# Patient Record
Sex: Female | Born: 1955 | Race: Black or African American | Hispanic: No | Marital: Single | Smoking: Current some day smoker
Health system: Southern US, Community
[De-identification: ages and names within clinical notes are randomized; demographics above are authoritative.]

## PROBLEM LIST (undated history)

## (undated) DIAGNOSIS — M109 Gout, unspecified: Secondary | ICD-10-CM

## (undated) DIAGNOSIS — I10 Essential (primary) hypertension: Secondary | ICD-10-CM

## (undated) DIAGNOSIS — E119 Type 2 diabetes mellitus without complications: Secondary | ICD-10-CM

## (undated) DIAGNOSIS — M1990 Unspecified osteoarthritis, unspecified site: Secondary | ICD-10-CM

## (undated) HISTORY — PX: OTHER SURGICAL HISTORY: SHX169

---

## 2004-06-28 ENCOUNTER — Emergency Department: Payer: Self-pay | Admitting: Internal Medicine

## 2005-06-01 ENCOUNTER — Emergency Department: Payer: Self-pay | Admitting: Emergency Medicine

## 2006-11-25 ENCOUNTER — Emergency Department: Payer: Self-pay | Admitting: Emergency Medicine

## 2008-03-05 ENCOUNTER — Emergency Department: Payer: Self-pay | Admitting: Emergency Medicine

## 2009-10-03 IMAGING — CT CT ABD-PELV W/ CM
1 of 2 series · 15 of 32 positions shown, 19 images · non-contrast
Comparison: none

REASON FOR EXAM: (1) RLQ abd pain nausea vomiting and diarrhea; (2) RLQ
abd pain nausea vomiting
COMMENTS:

PROCEDURE:     CT  - CT ABDOMEN / PELVIS  W  - [DATE] [DATE]
RESULT:
TECHNIQUE: Helical 3 mm sections were obtained from the lung bases through
the pubic symphysis status post intravenous administration of 100 mL of
[JX].

[Series 2: appendicitis · axial · 0.77mm/px · z∈[-631,-226]mm · 15 of 147 slices shown, 19 images]
[im 6/147  soft-tissue]
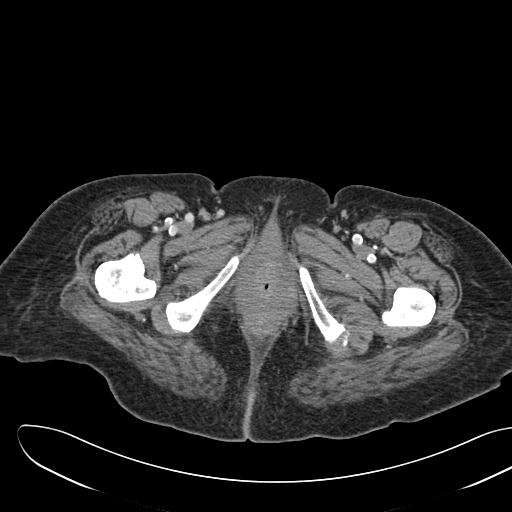
[im 6/147  bone]
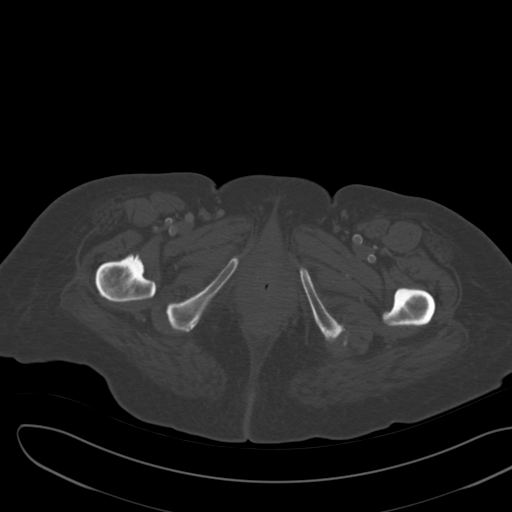
[im 18/147  soft-tissue]
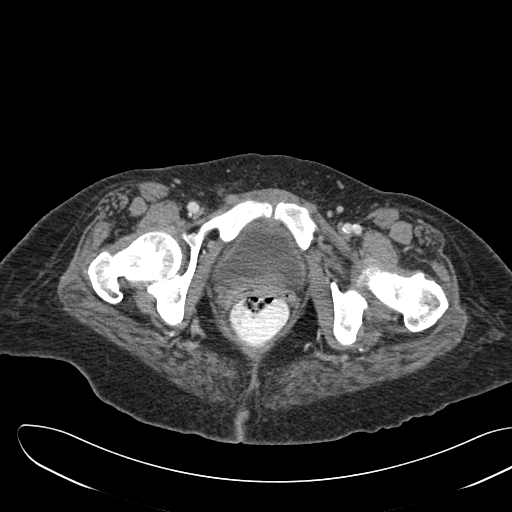
[im 30/147  soft-tissue]
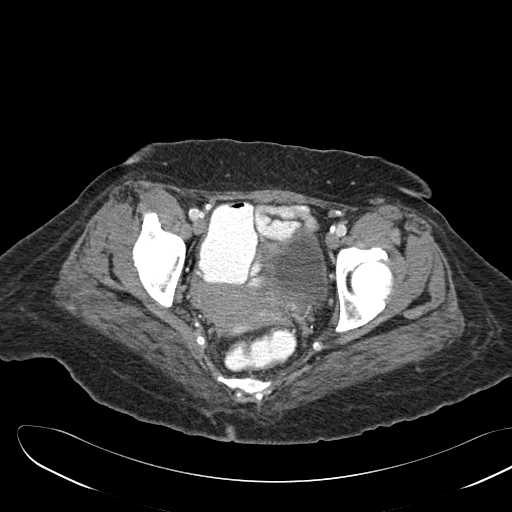
[im 41/147  soft-tissue]
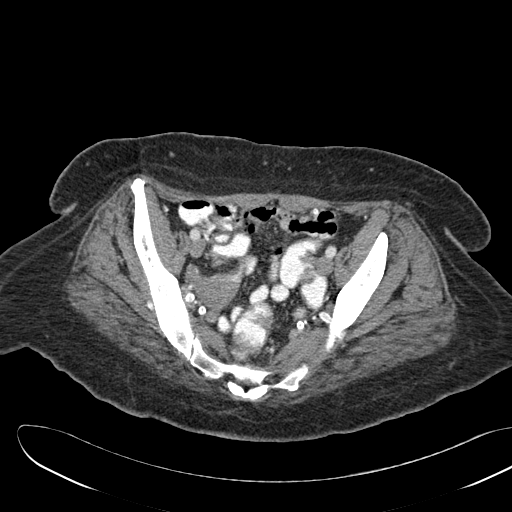
[im 53/147  soft-tissue]
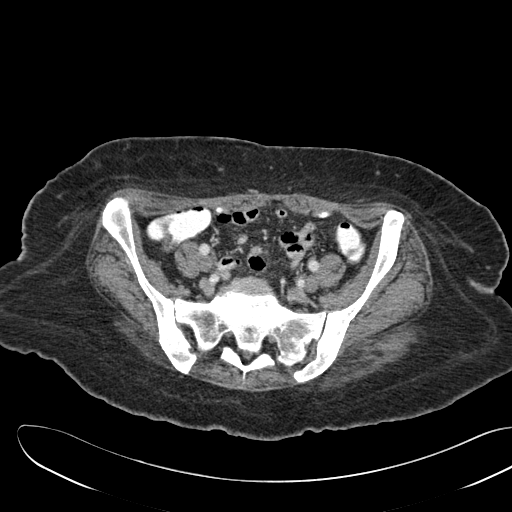
[im 65/147  soft-tissue]
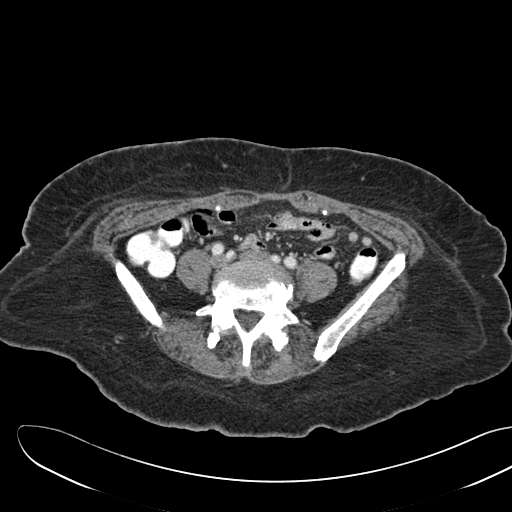
[im 76/147  soft-tissue]
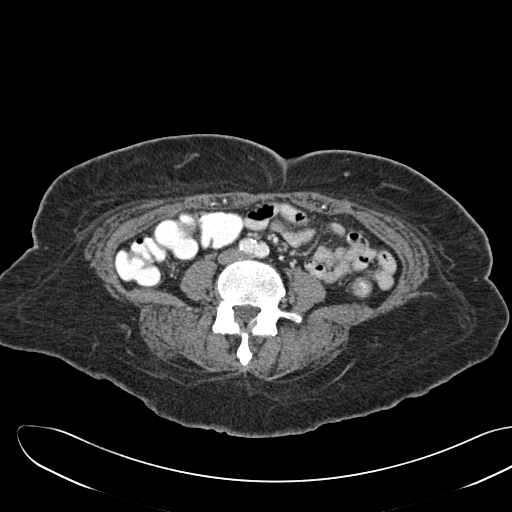
[im 82/147  soft-tissue]
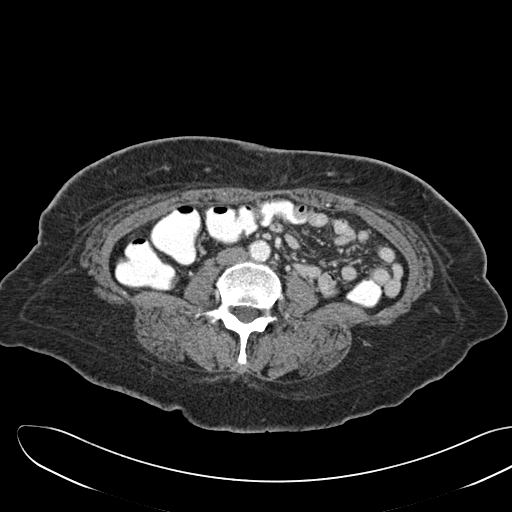
[im 94/147  soft-tissue]
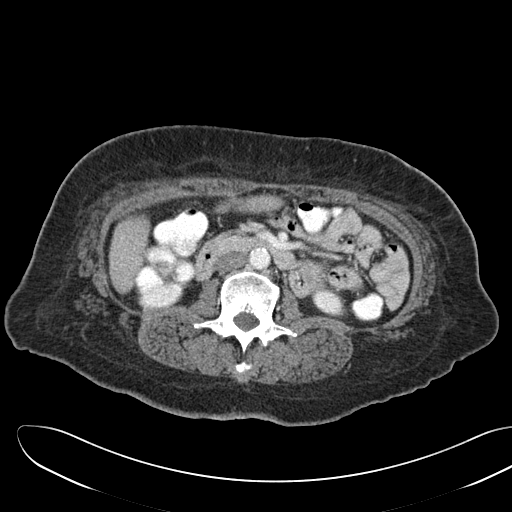
[im 94/147  bone]
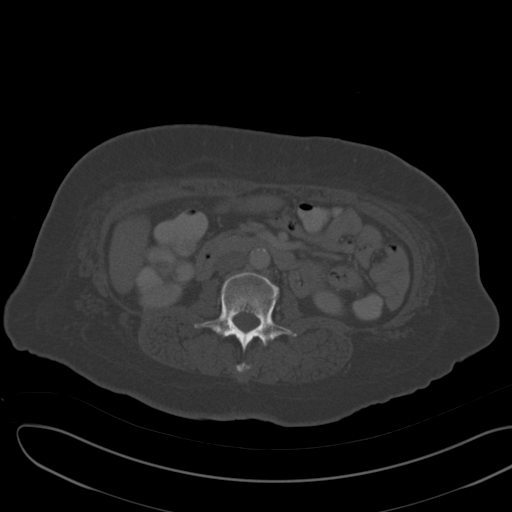
[im 106/147  soft-tissue]
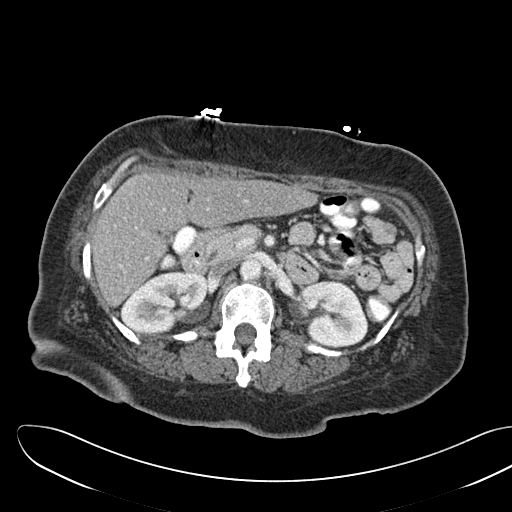
[im 117/147  soft-tissue]
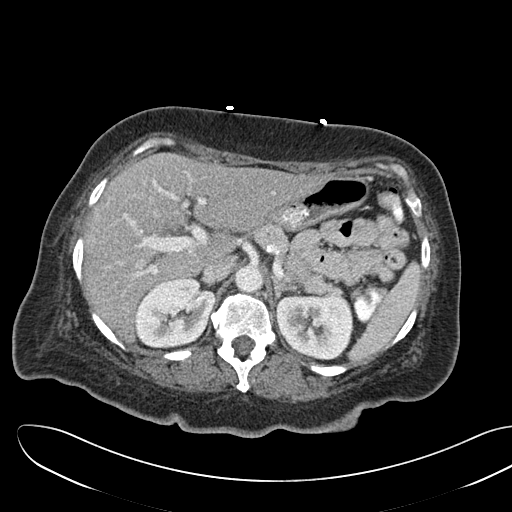
[im 123/147  lung]
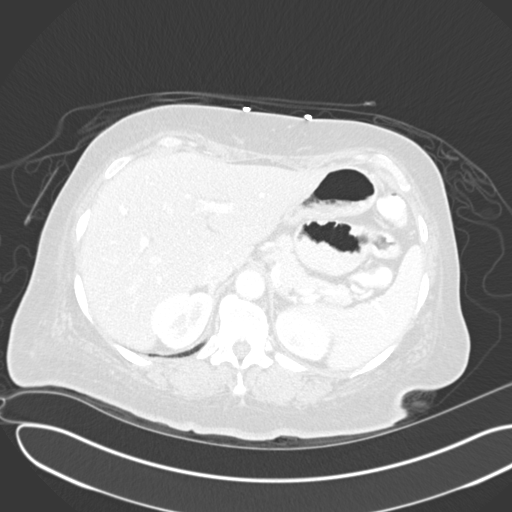
[im 129/147  soft-tissue]
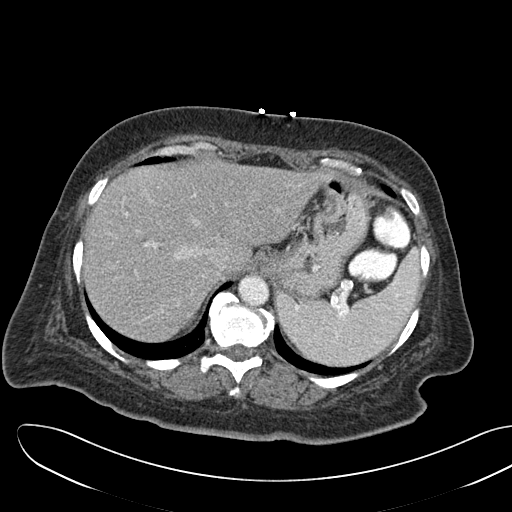
[im 129/147  lung]
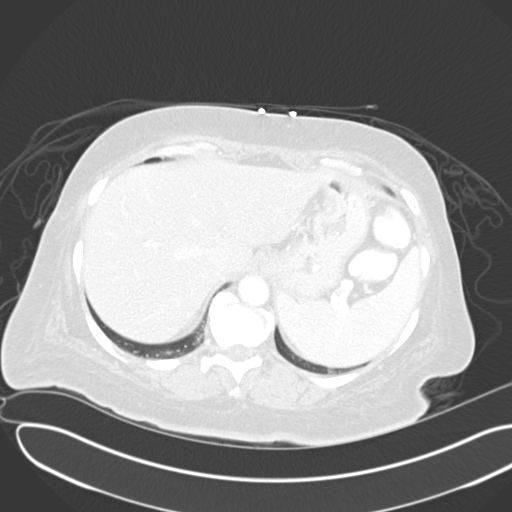
[im 135/147  lung]
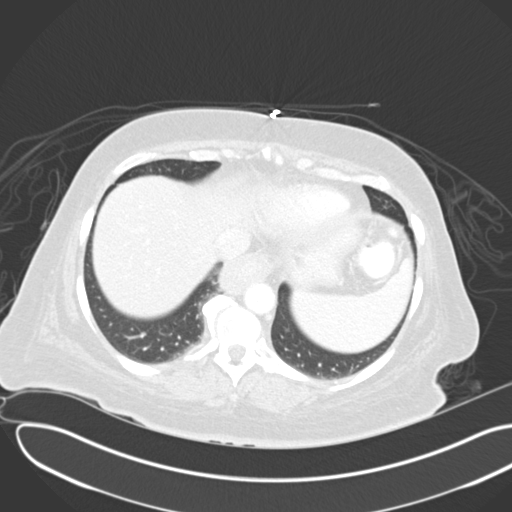
[im 141/147  soft-tissue]
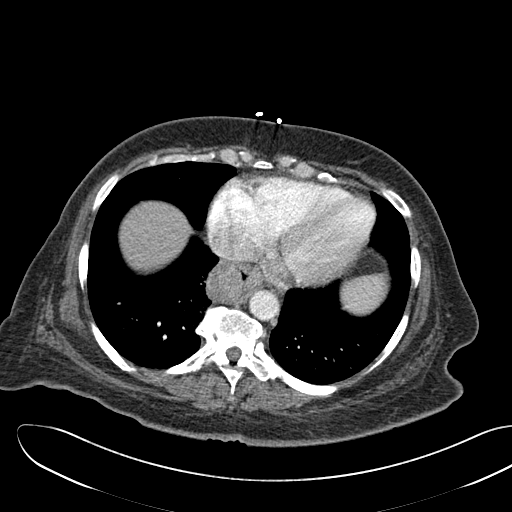
[im 141/147  lung]
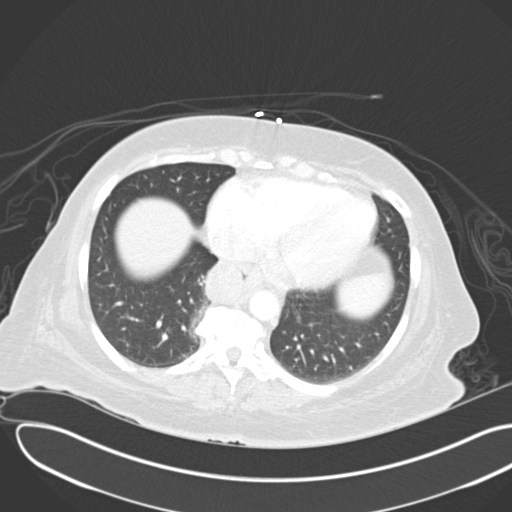

[15 of 32 positions shown; findings below may reference images not displayed]

FINDINGS: There is diffuse wall thickening of the distal esophagus. The
visualized lung parenchyma is otherwise grossly unremarkable. The liver,
spleen, adrenals, pancreas and kidneys are unremarkable. There is no CT
evidence of bowel obstruction or secondary signs reflecting enteritis,
colitis or diverticulitis.
IMPRESSION: 1.     Diffuse thickening of the distal esophageal wall. Further evaluation
with direct visualization is recommended. Infectious or inflammatory
etiologies are of differential concern though more ominous etiologies cannot
be excluded.
2.     Dr. MOULEY of the Emergency Department was informed of these
findings via preliminary fax report on [DATE].

## 2009-10-03 IMAGING — CT CT HEAD WITHOUT CONTRAST
2 series · 16 of 30 positions shown, 20 images · non-contrast
Comparison: none

REASON FOR EXAM: dizzy
COMMENTS:

PROCEDURE:     CT  - CT HEAD WITHOUT CONTRAST  - [DATE]  [DATE]
RESULT:     Technique: Helical 5mm sections were obtained from the skull
base to the vertex without administration of intravenous contrast.

[Series 2: without · axial · non-contrast · 0.44mm/px · z∈[+428,+548]mm · 13 of 30 slices shown, 17 images]
[im 3/30  brain]
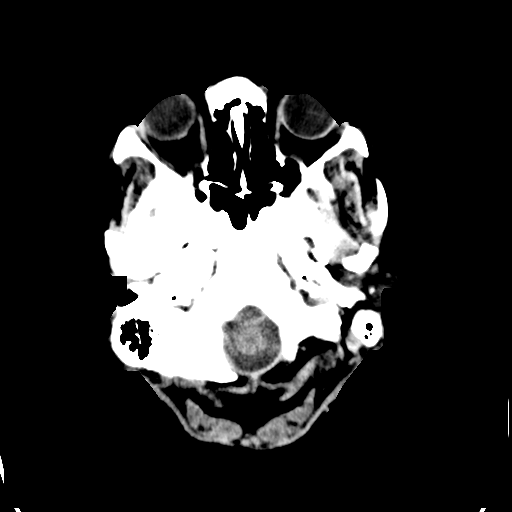
[im 3/30  bone]
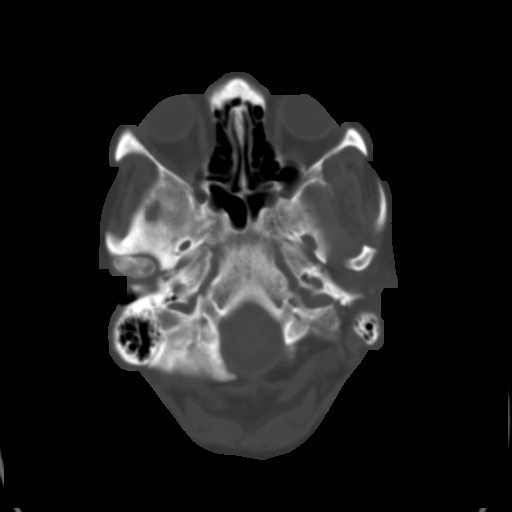
[im 5/30  brain]
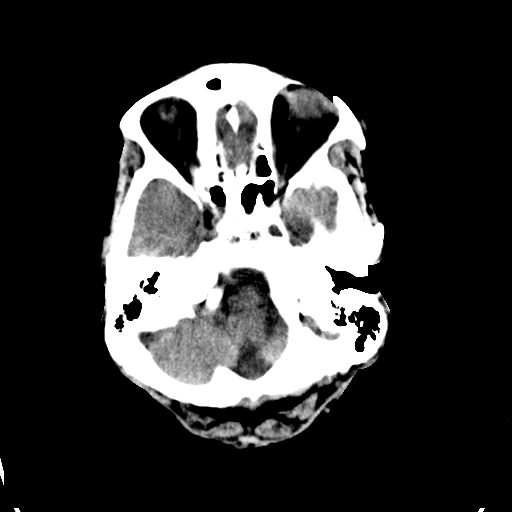
[im 7/30  brain]
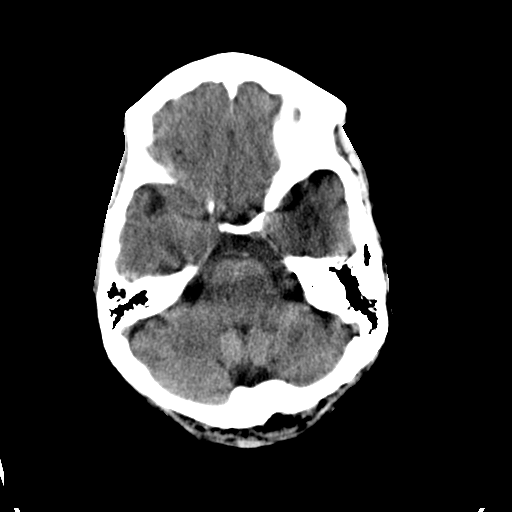
[im 9/30  brain]
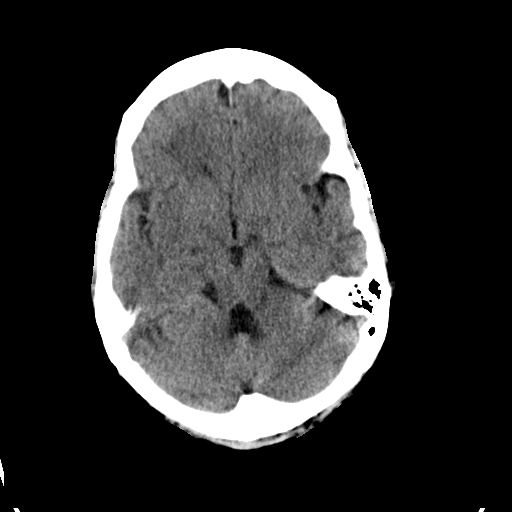
[im 11/30  brain]
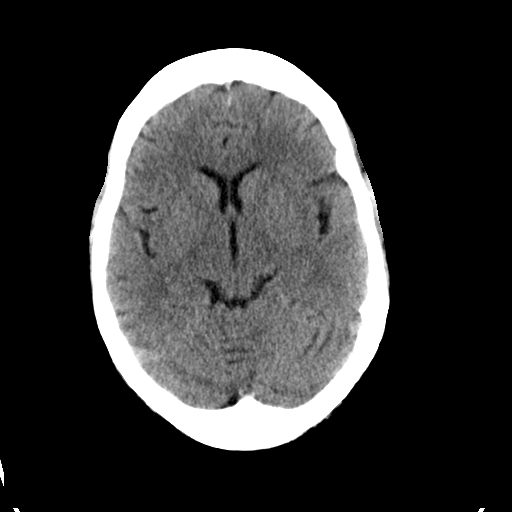
[im 11/30  bone]
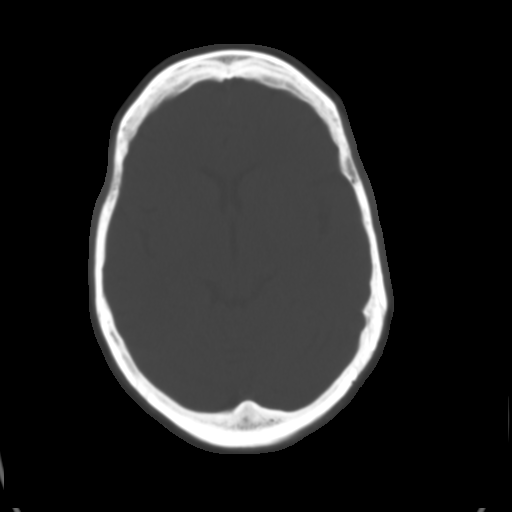
[im 13/30  brain]
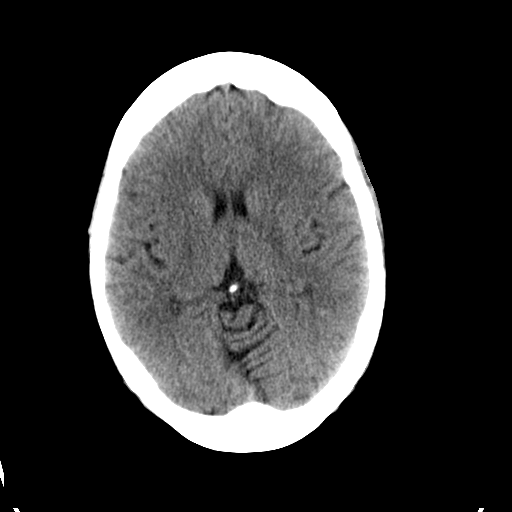
[im 15/30  brain]
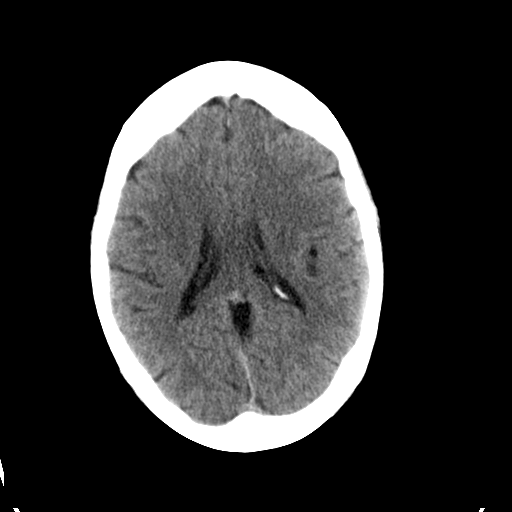
[im 17/30  brain]
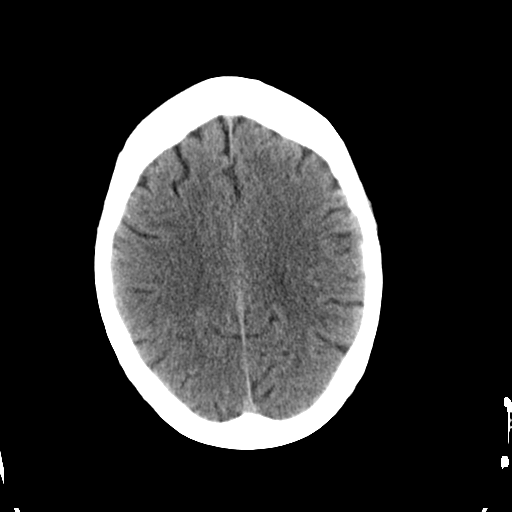
[im 19/30  brain]
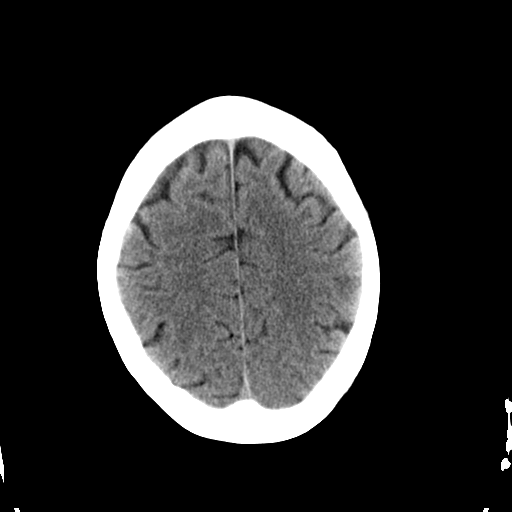
[im 19/30  bone]
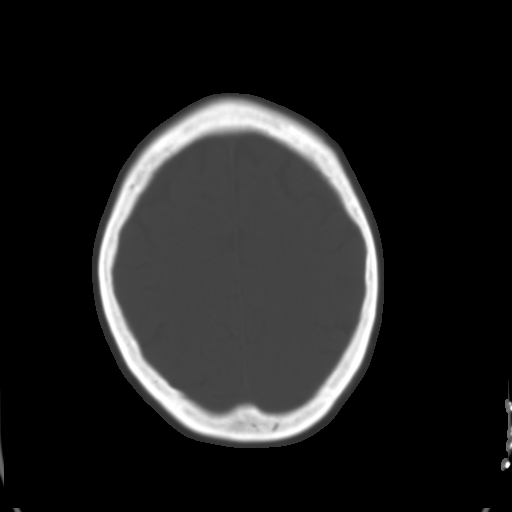
[im 21/30  brain]
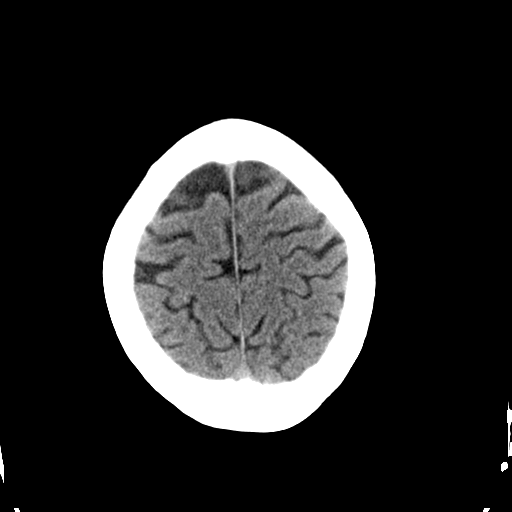
[im 23/30  brain]
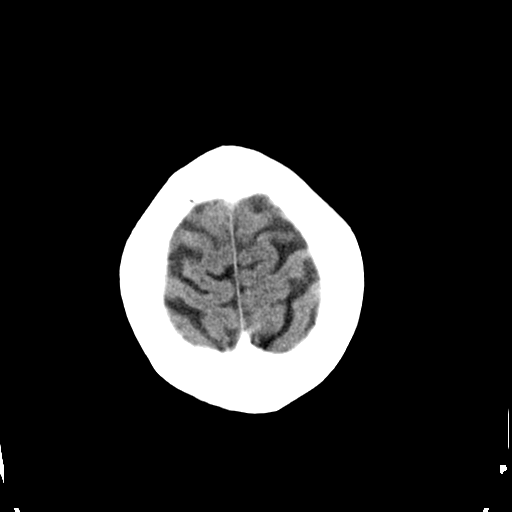
[im 25/30  brain]
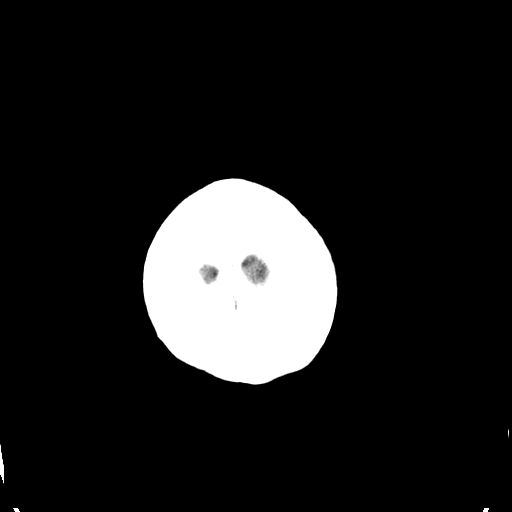
[im 27/30  brain]
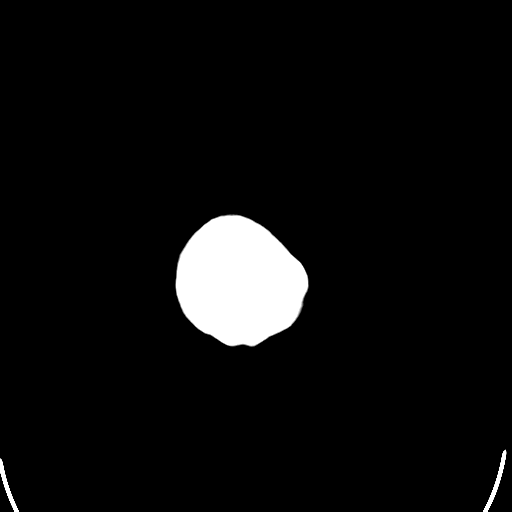
[im 27/30  bone]
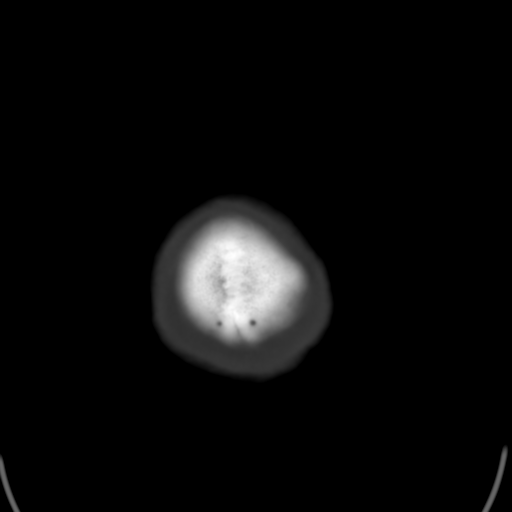

[Series 3: bone · axial · 0.44mm/px · z∈[+428,+468]mm · 3 of 30 slices shown]
[im 3/30  bone]
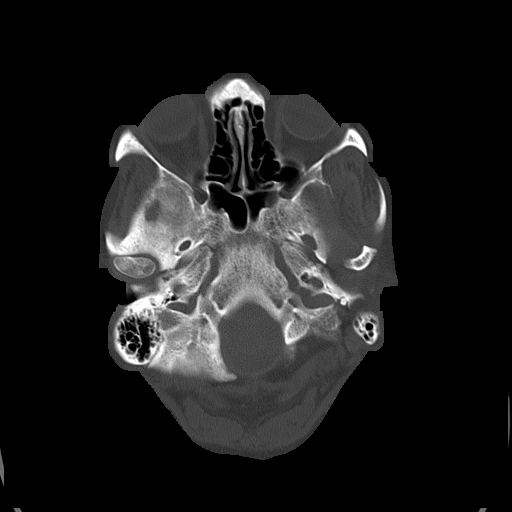
[im 7/30  bone]
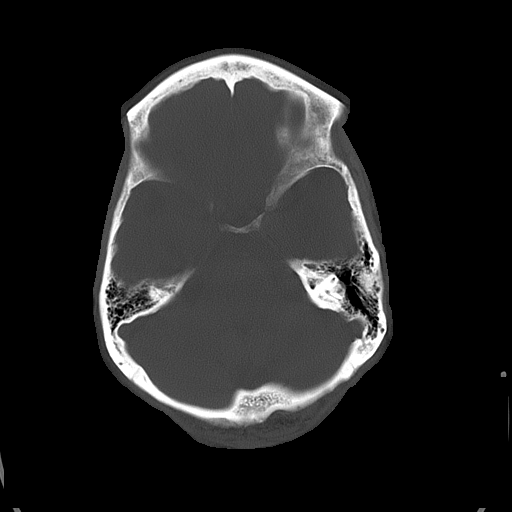
[im 11/30  bone]
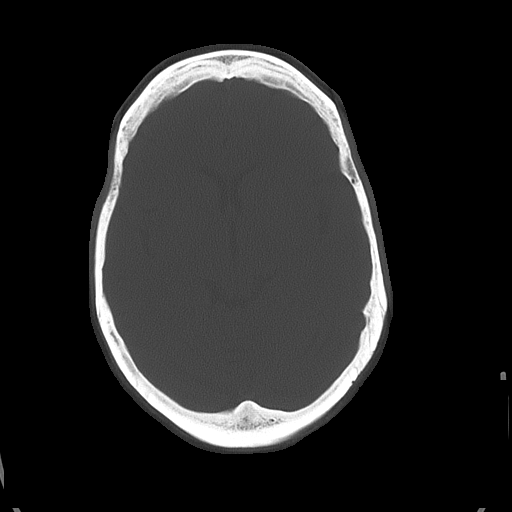

[16 of 30 positions shown; findings below may reference images not displayed]

FINDINGS: There is not evidence of intra-axial fluid collections. There is
no evidence of acute hemorrhage or secondary signs reflecting mass effect or
subacute or chronic focal territorial infarction. The osseous structures
demonstrate no evidence of a depressed skull fracture. If there is
persistent concern clinical follow-up with MRI is recommended.
IMPRESSION: 1. No evidence of acute intracranial abnormalitites.
2. Dr. KRISTIN of the emergency department was informed of these findings
via a pulmonary faxed report.

## 2009-10-04 ENCOUNTER — Observation Stay: Payer: Self-pay | Admitting: Internal Medicine

## 2009-10-04 IMAGING — US US CAROTID DUPLEX BILAT
1 series · 17 of 24 positions shown · non-contrast
Comparison: none

REASON FOR EXAM: dizziness
COMMENTS:

PROCEDURE:     US  - US CAROTID DOPPLER BILATERAL  - [DATE]  [DATE]
RESULT:
TECHNIQUE: Grayscale, color flow, spectral waveform and duplex Doppler
imaging was performed of the right and left carotid systems.

[Series 1: us carotid duplex bilat · 17 of 59 slices shown]
[im 1/59]
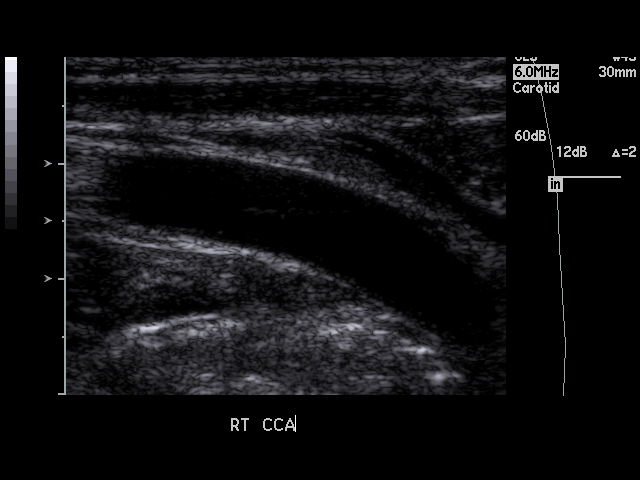
[im 6/59]
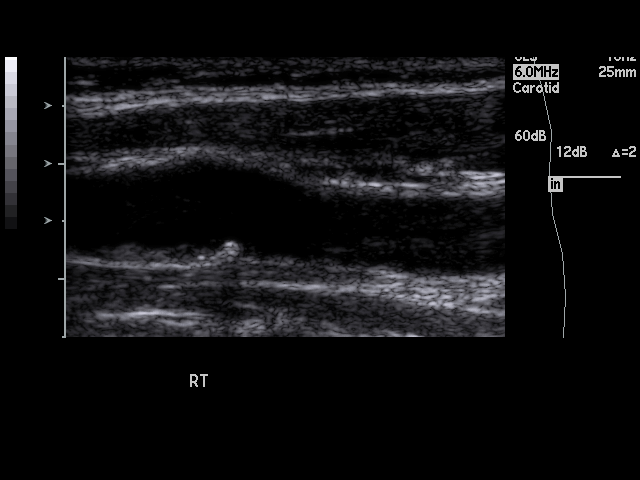
[im 8/59]
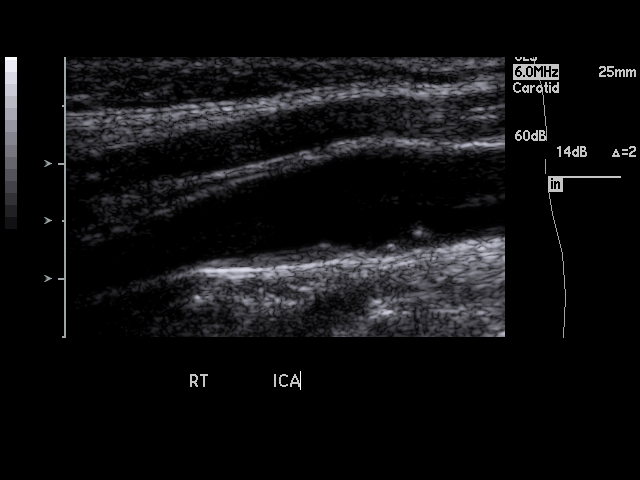
[im 11/59]
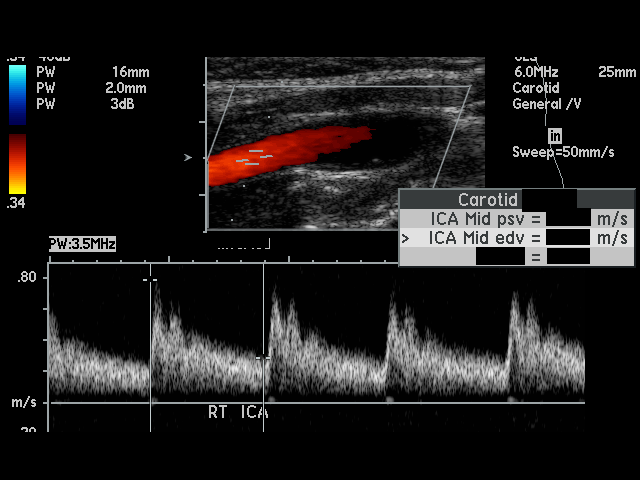
[im 16/59]
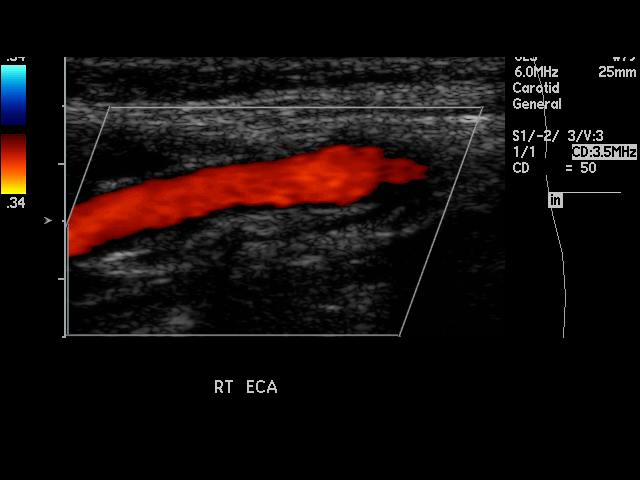
[im 18/59]
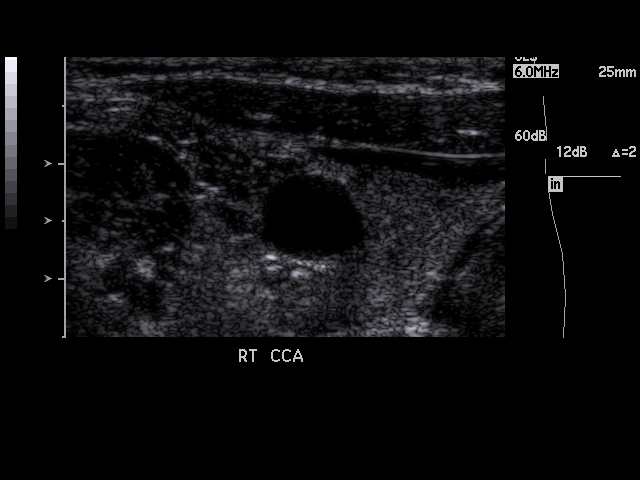
[im 23/59]
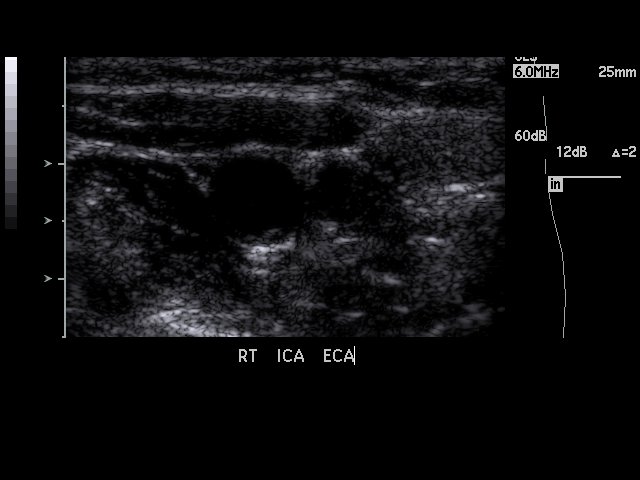
[im 26/59]
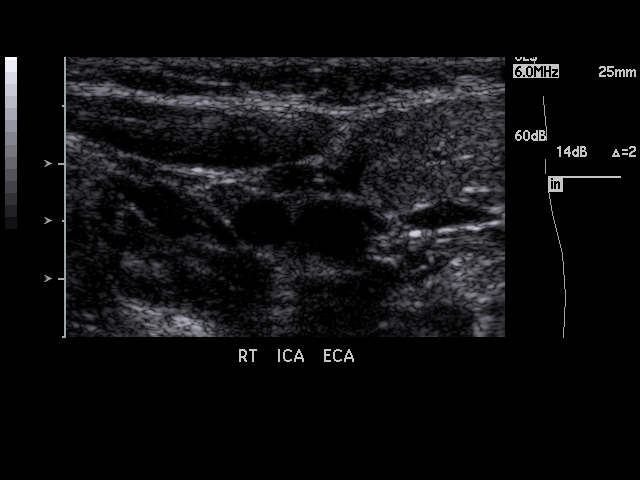
[im 31/59]
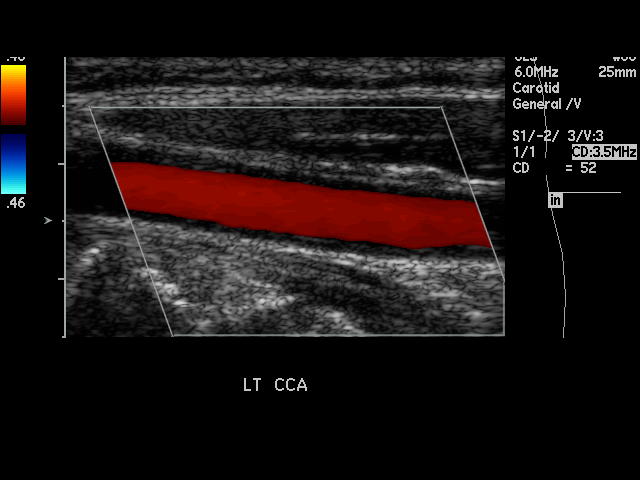
[im 33/59]
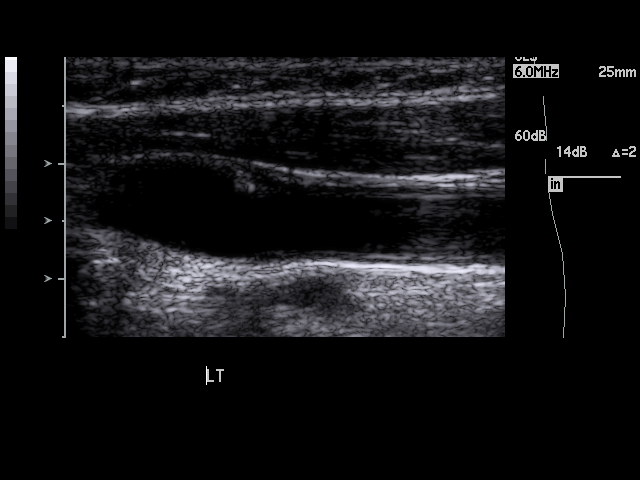
[im 36/59]
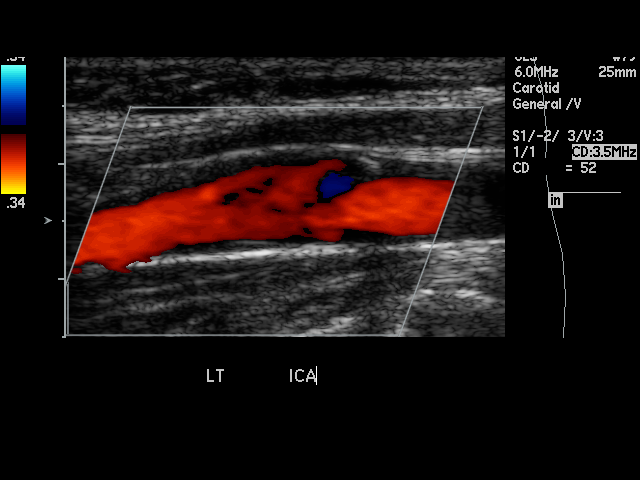
[im 41/59]
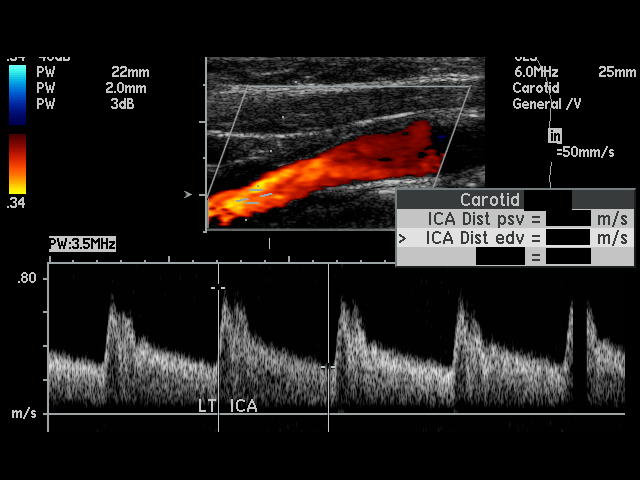
[im 43/59]
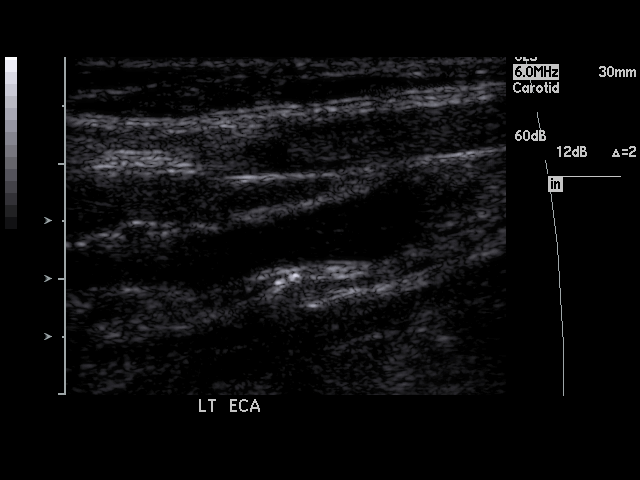
[im 48/59]
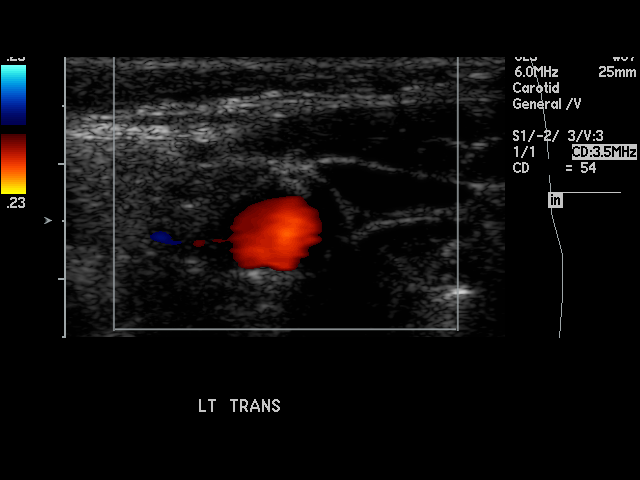
[im 51/59]
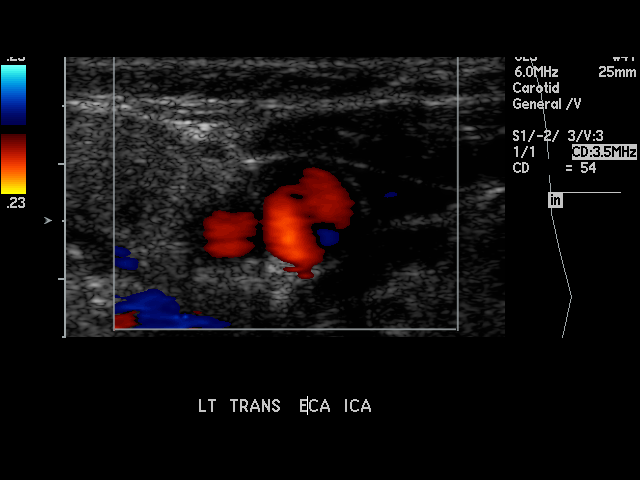
[im 53/59]
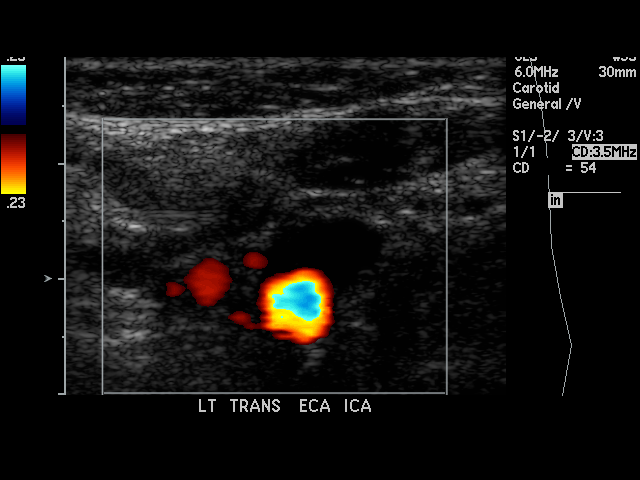
[im 59/59]
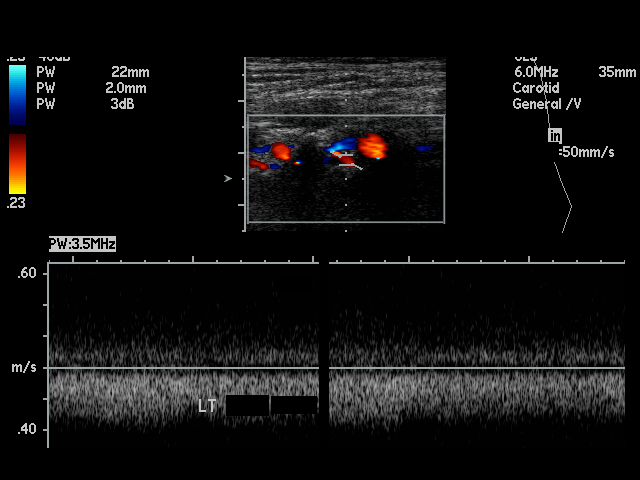

[17 of 24 positions shown; findings below may reference images not displayed]

FINDINGS: Evaluation of the right carotid system demonstrates a small
amount of calcified plaque within the carotid bulb demonstrating less than
50% percent visual stenosis. Similar findings are identified within the left
system. Spectral waveform and color flow imaging of the right and left
carotid system is unremarkable. Antegrade flow is identified within the
right vertebral artery. The left vertebral artery is not clearly identified.

ICA/CCA ratios: Right: 1.083 and Left: 0.88.
IMPRESSION: 1. No sonographic evidence of hemodynamically significant stenosis within
the right or left carotid systems.
2. A preliminary faxed report was relayed on [DATE] at [DATE] a.m. central
time.

## 2010-05-15 ENCOUNTER — Emergency Department: Payer: Self-pay | Admitting: Unknown Physician Specialty

## 2010-05-15 IMAGING — CR DG CHEST 1V PORT
1 series · 1 of 1 positions shown · non-contrast
Comparison: none

REASON FOR EXAM: chest discomfort  -  rm1
COMMENTS:

PROCEDURE:     DXR - DXR PORTABLE CHEST SINGLE VIEW  - [DATE]  [DATE]
RESULT:     The lungs are adequately inflated and clear. The mediastinum is
normal in width. The cardiac silhouette is normal in size. The pulmonary
vascularity is not engorged. I see no pleural effusion.

[view not recorded]
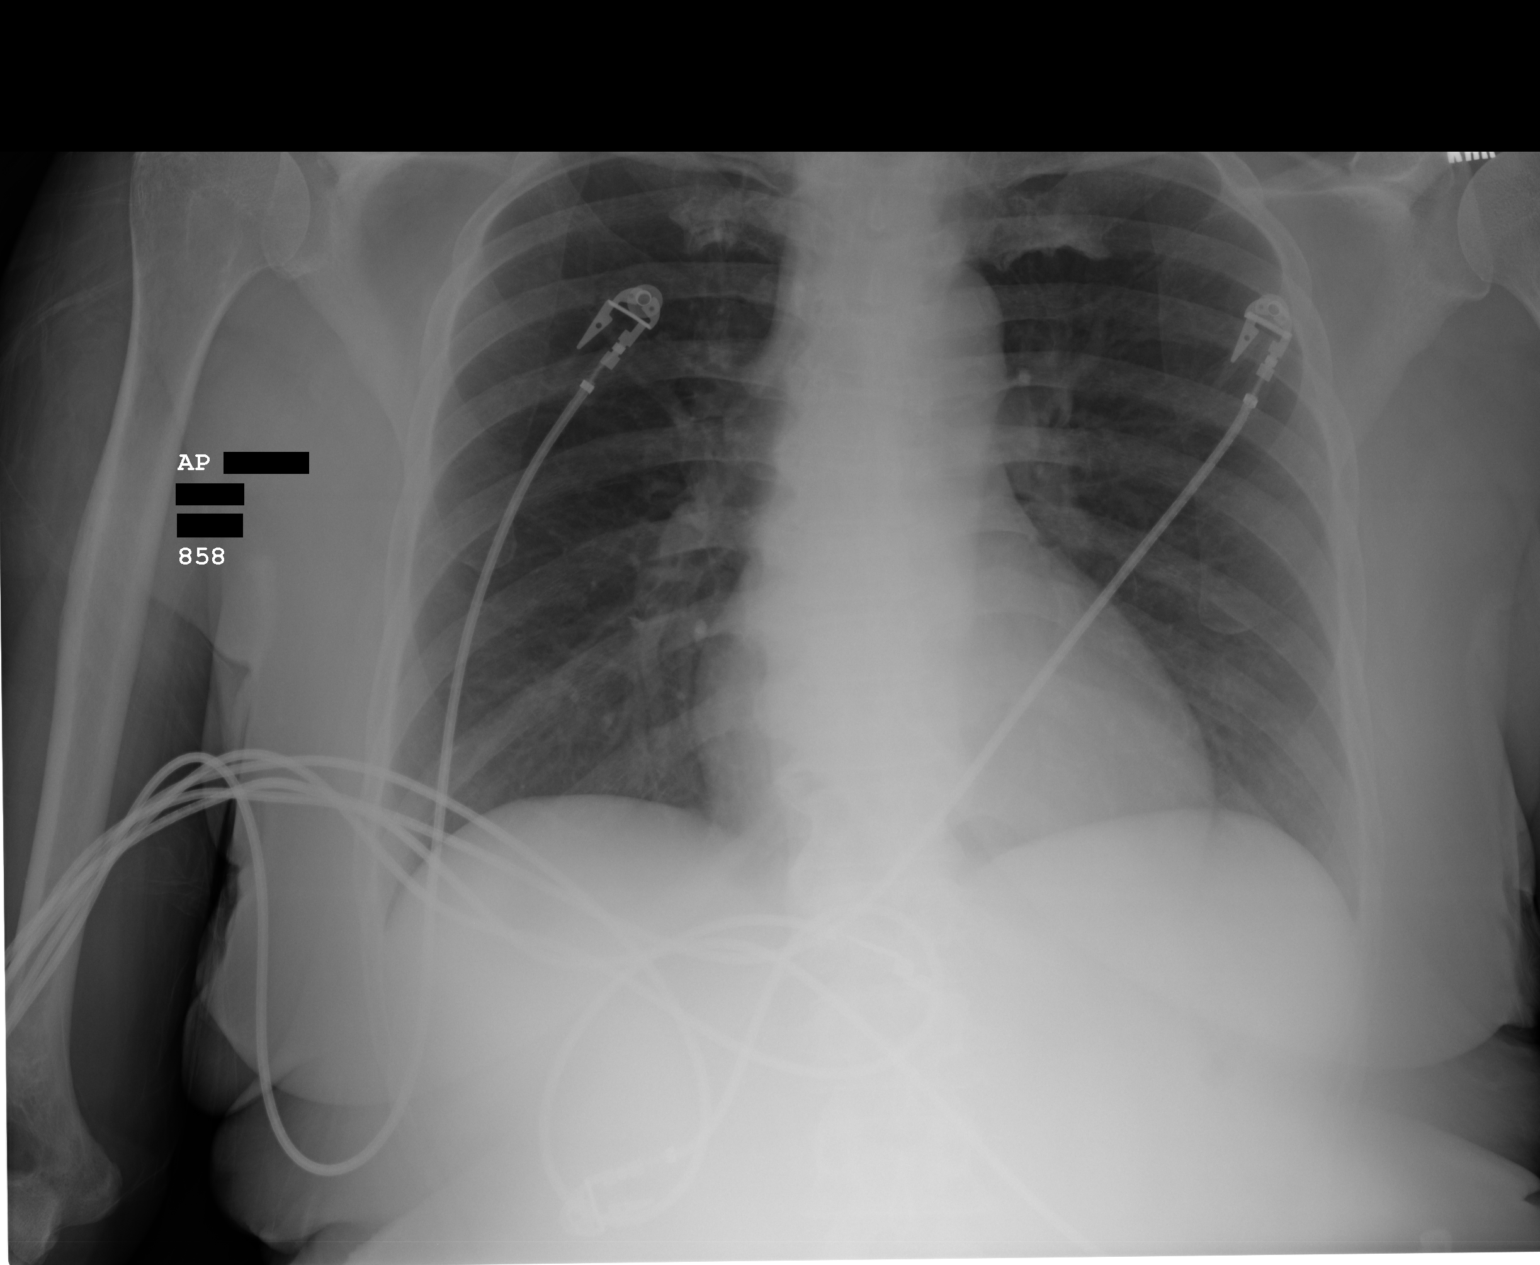

[1 of 1 positions shown; findings below may reference images not displayed]

IMPRESSION: I do not see evidence of acute cardiopulmonary abnormality.

## 2010-05-16 ENCOUNTER — Emergency Department: Payer: Self-pay | Admitting: Emergency Medicine

## 2010-05-16 IMAGING — US ABDOMEN ULTRASOUND
1 series · 17 of 25 positions shown · non-contrast
Comparison: none

REASON FOR EXAM: epigastric pain nausea and vomiting
COMMENTS:

PROCEDURE:     US  - US ABDOMEN GENERAL SURVEY  - [DATE] [DATE]
RESULT:     Comparison: None
TECHNIQUE: Multiple gray-scale and color-flow Doppler images of the abdomen
are presented for review.

[Series 1: abdomen ultrasound · 17 of 56 slices shown]
[im 1/56]
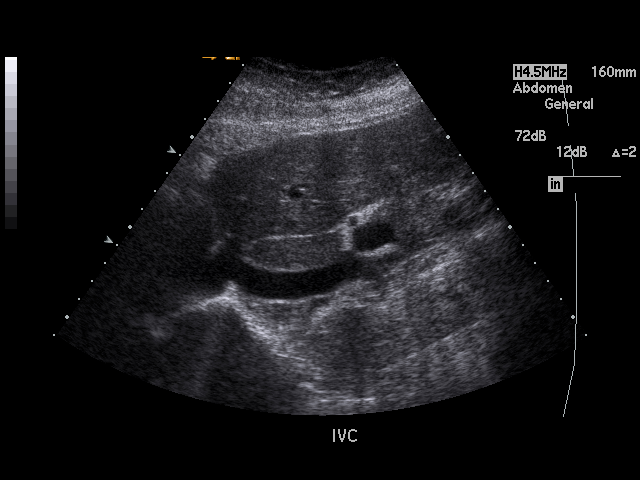
[im 5/56]
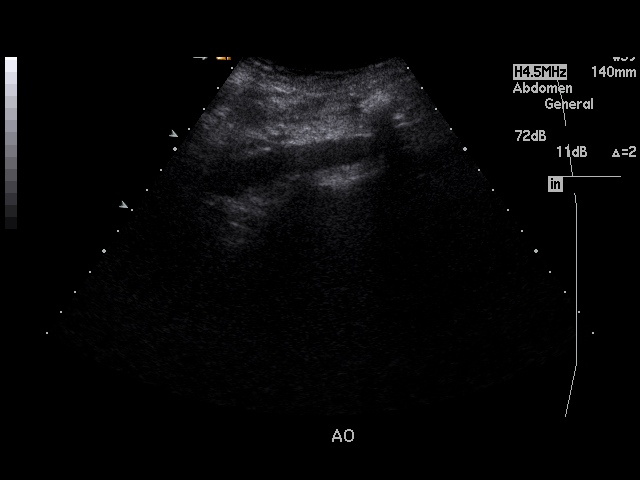
[im 7/56]
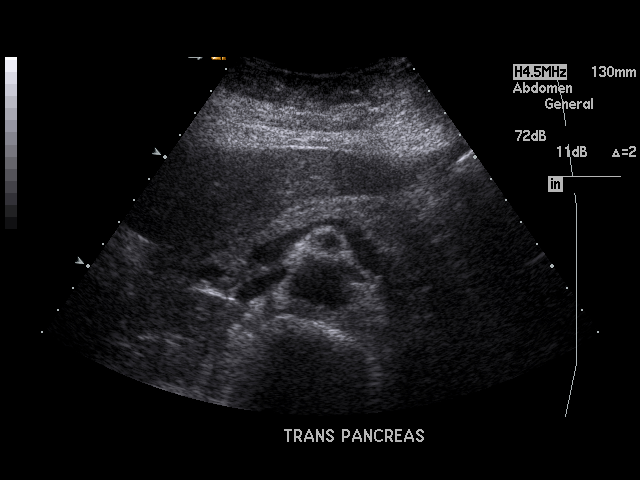
[im 12/56]
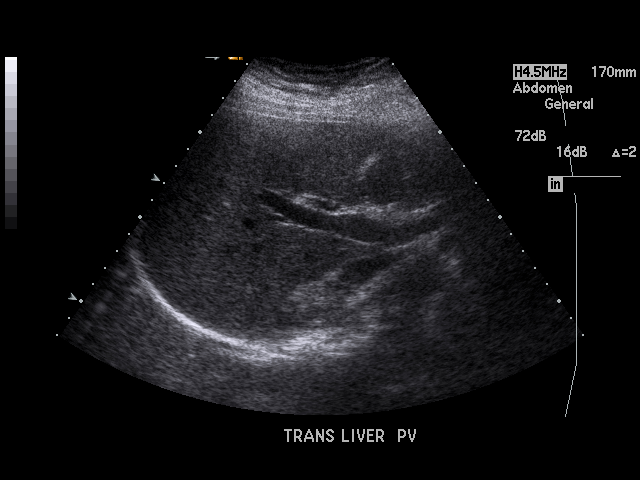
[im 14/56]
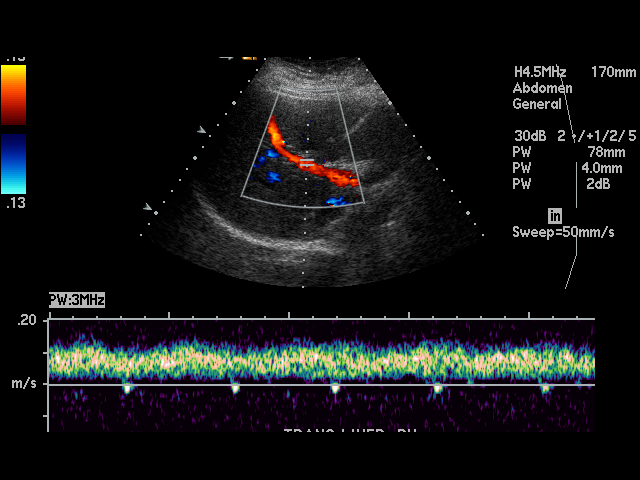
[im 19/56]
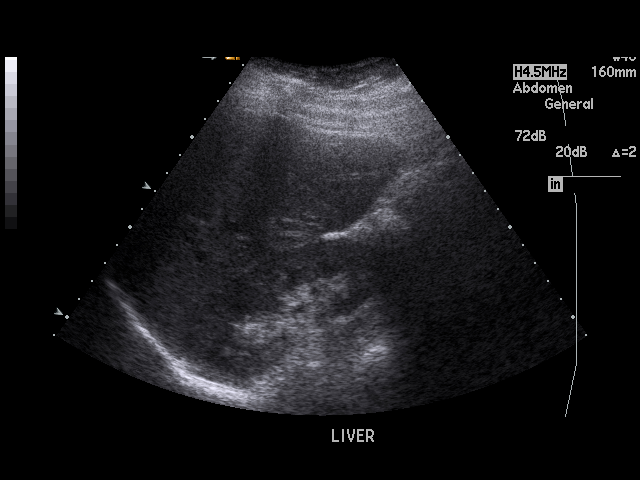
[im 21/56]
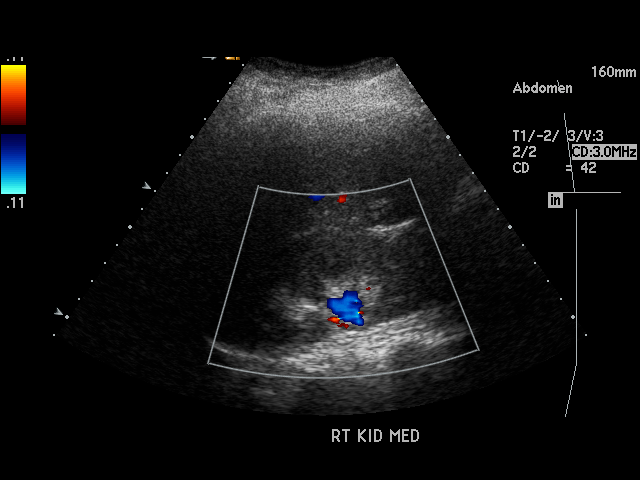
[im 26/56]
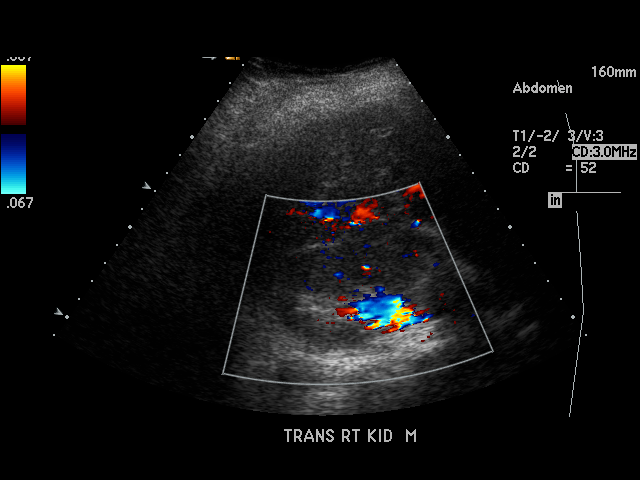
[im 28/56]
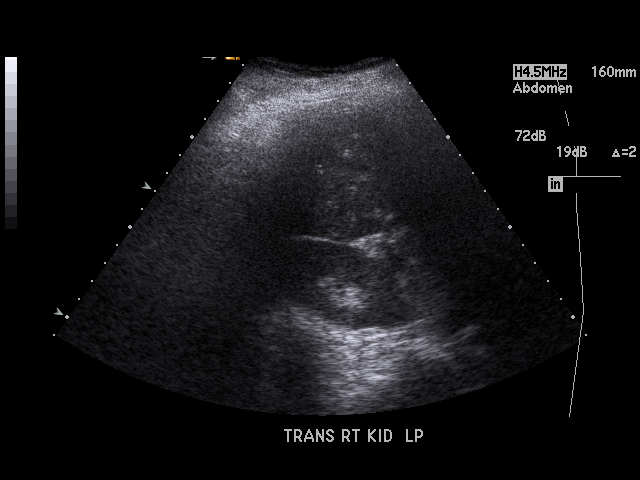
[im 30/56]
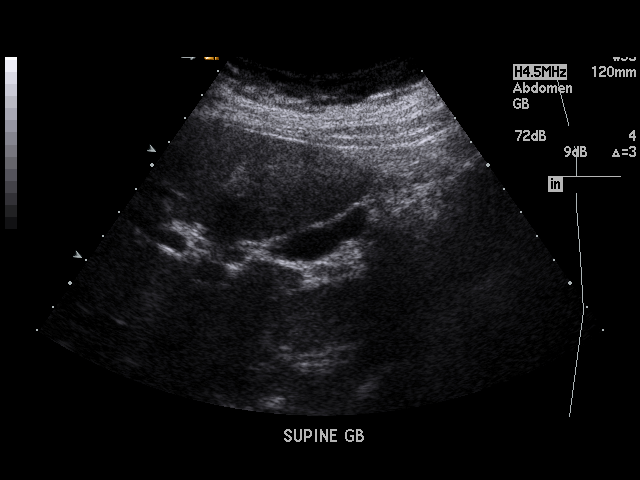
[im 35/56]
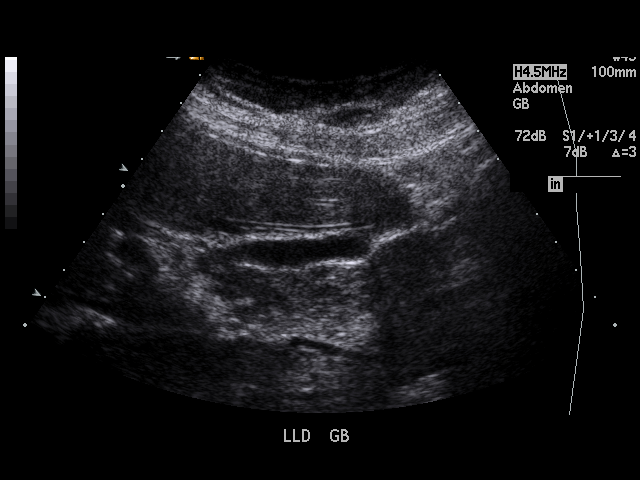
[im 37/56]
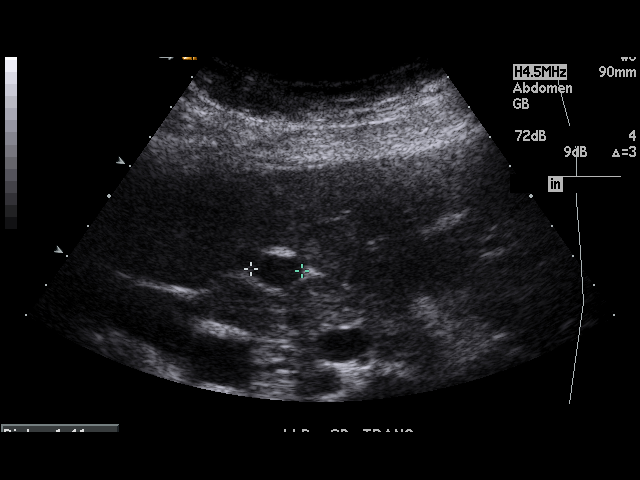
[im 42/56]
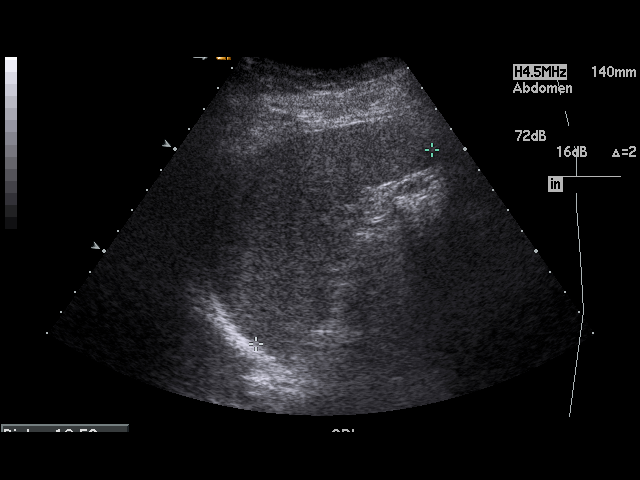
[im 44/56]
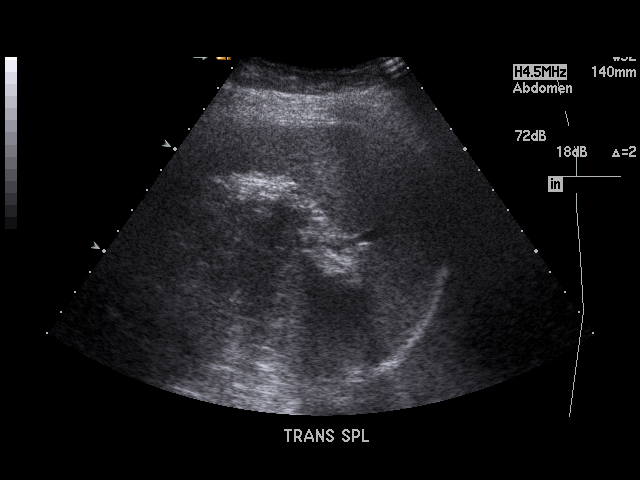
[im 49/56]
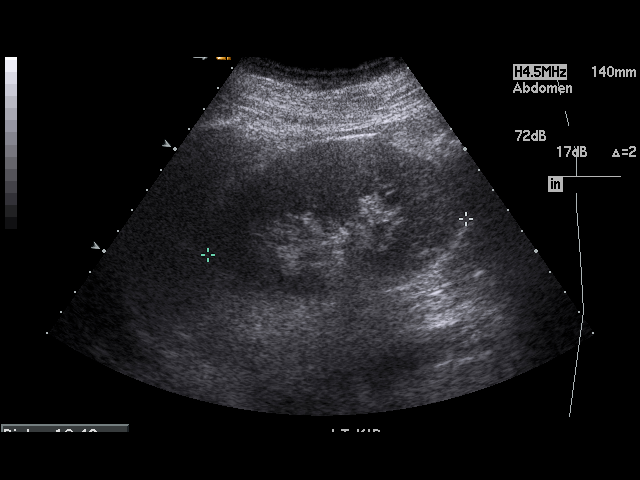
[im 51/56]
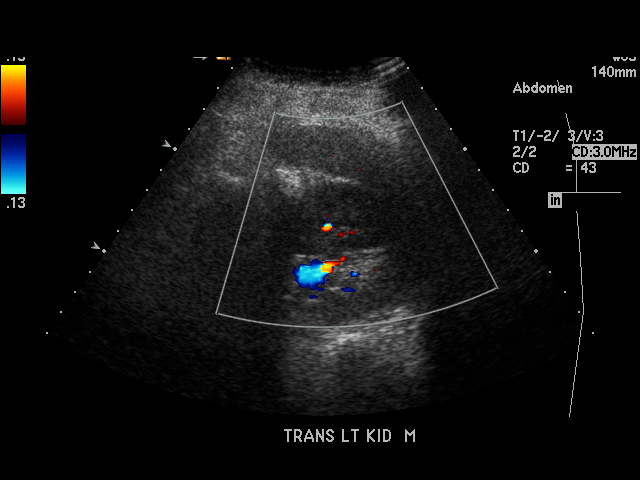
[im 56/56]
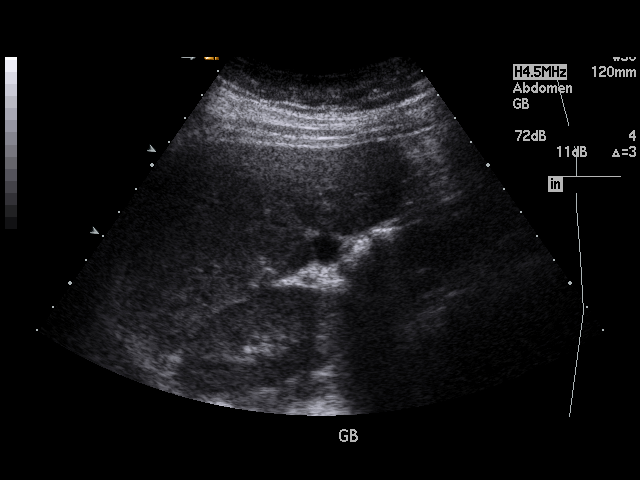

[17 of 25 positions shown; findings below may reference images not displayed]

FINDINGS: Visualized portions of the liver demonstrate normal echogenicity and normal
contours. The liver is without evidence of a focal hepatic lesion.

The gallbladder is contracted without cholelithiasis or biliary sludge.
There is no intra- or extrahepatic biliary ductal dilatation. The common
duct measures 7.2 mm in maximal diameter. There is no gallbladder wall
thickening, pericholecystic fluid, or sonographic Murphy's sign.

The visualized portion of the pancreas is normal in echogenicity. The spleen
is unremarkable. Bilateral kidneys are normal in echogenicity and size. The
right kidney measures 11 x 5.3 x 4.4 cm. The left kidney measures 10.5 x
x 4.4 cm. There are no renal calculi or hydronephrosis. The abdominal aorta
and IVC are unremarkable.
IMPRESSION: Contracted gallbladder without cholelithiasis or sonographic evidence of
acute cholecystitis. If there is persistent concern regarding cholecystitis
recommend further evaluation with a HIDA scan.

There is mild dilatation of the common bile duct measuring 7.2 mm without
evidence of choledocholithiasis. If there is further clinical concern
recommend M.R.C.P.

## 2010-05-17 IMAGING — CR DG ABDOMEN 2V
1 series · 2 of 2 positions shown · non-contrast
Comparison: none

REASON FOR EXAM: abdominal pain nausea and vomiting
COMMENTS:

[Series 1: view not recorded · 0.17mm/px · 2 of 2 slices shown]
[im 1/2]
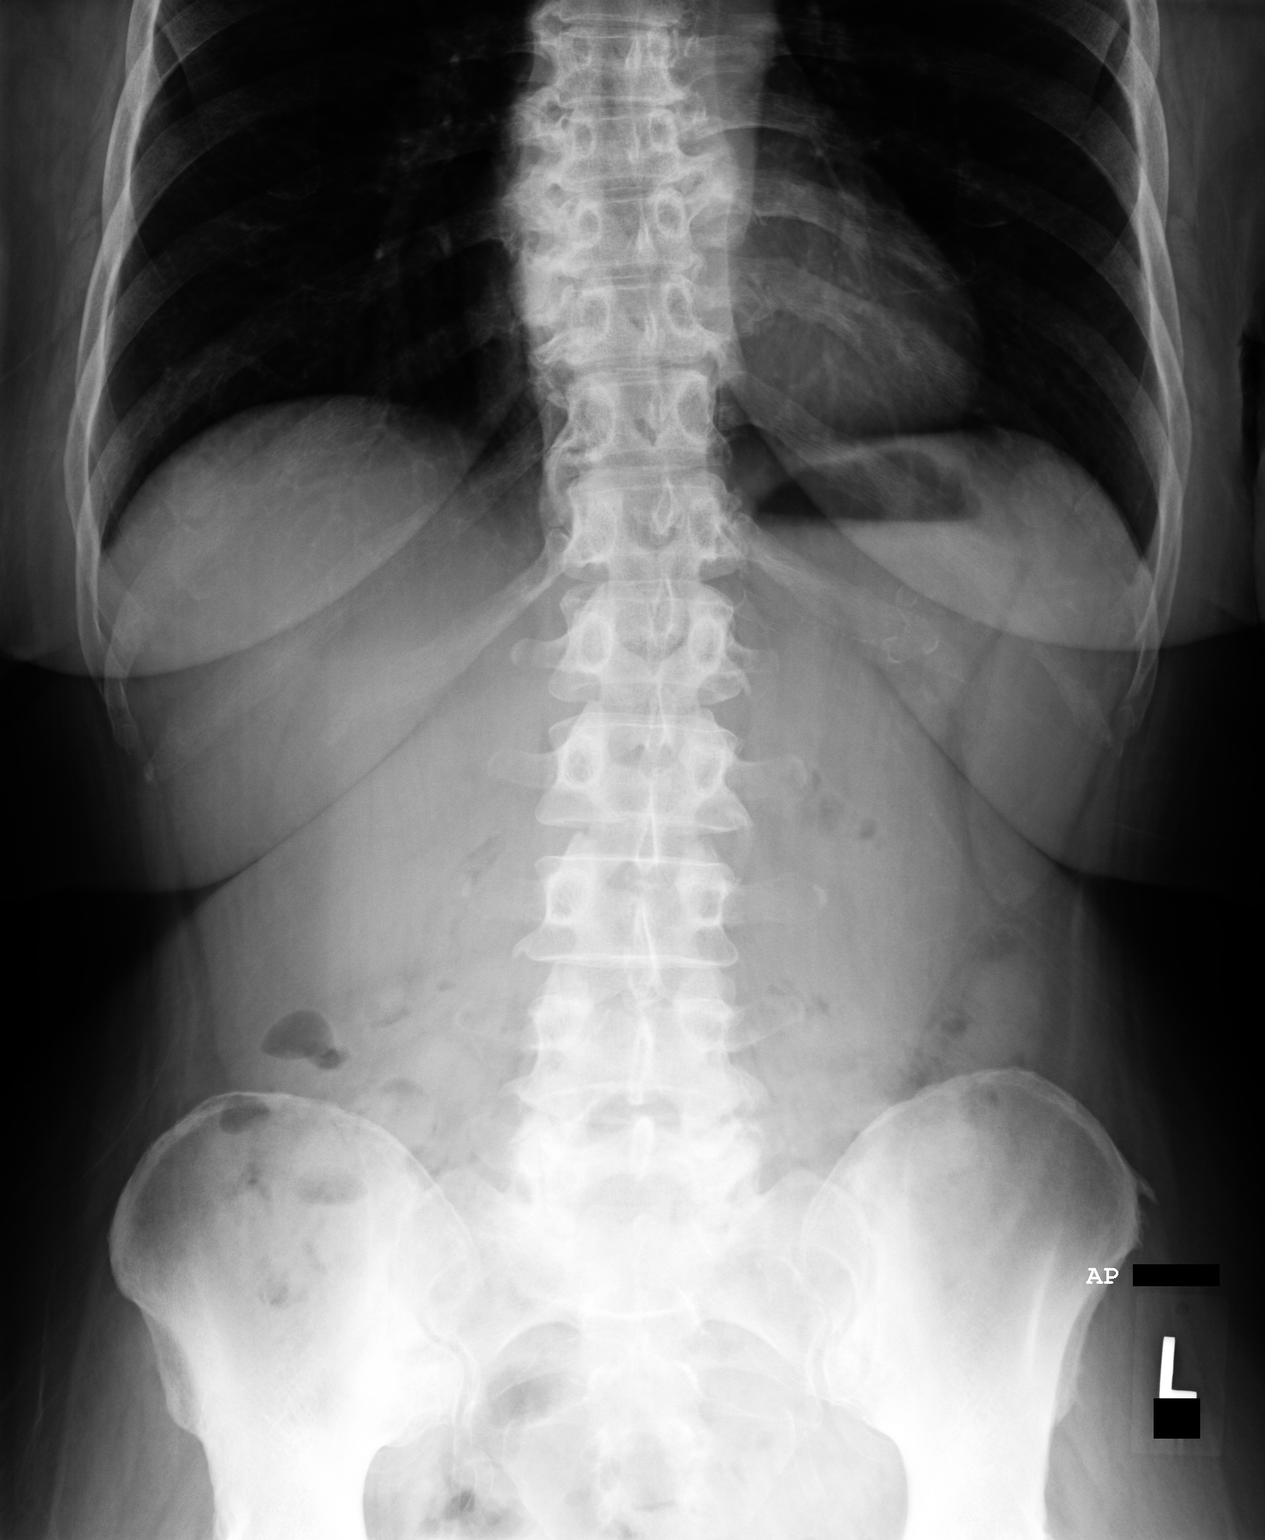
[im 2/2]
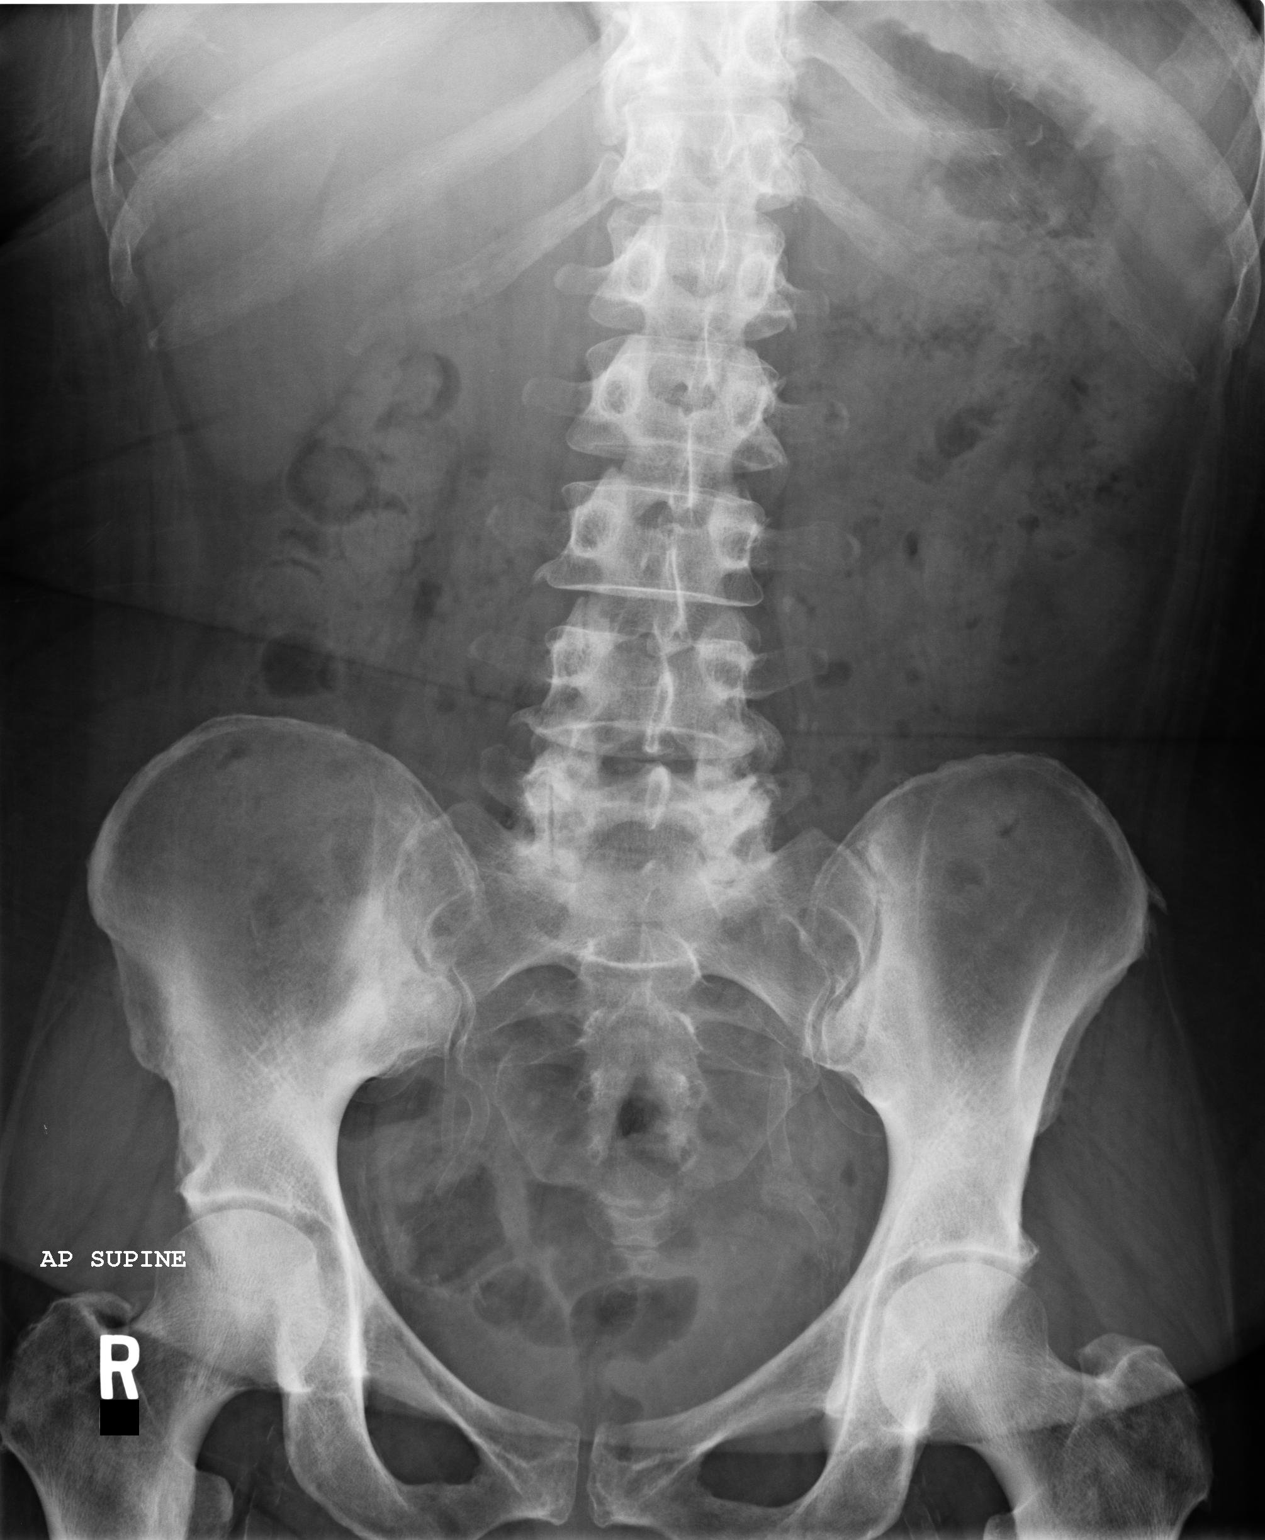

[2 of 2 positions shown; findings below may reference images not displayed]

PROCEDURE:     DXR - DXR ABDOMEN 2 V FLAT AND ERECT  - [DATE]  [DATE]

RESULT:     There is no previous study for comparison.

Air and fecal material is scattered through the colon to the rectum. The
stomach is not significantly distended. No free air is evident. There is no
foreign body. No definite renal calculi are evident
IMPRESSION: No evidence of bowel obstruction or perforation.

## 2010-07-06 ENCOUNTER — Ambulatory Visit: Payer: Self-pay | Admitting: Emergency Medicine

## 2011-10-31 ENCOUNTER — Ambulatory Visit: Payer: Self-pay | Admitting: Family Medicine

## 2012-08-23 ENCOUNTER — Ambulatory Visit: Payer: Self-pay

## 2012-08-23 IMAGING — CR DG KNEE 1-2V*R*
1 series · 2 of 2 positions shown · non-contrast
Comparison: none

REASON FOR EXAM: swollen legs DM both arm achingHBP back pain
COMMENTS:

[Series 1: t knee ap right · 0.14mm/px · 2 of 2 slices shown]
[im 1/2]
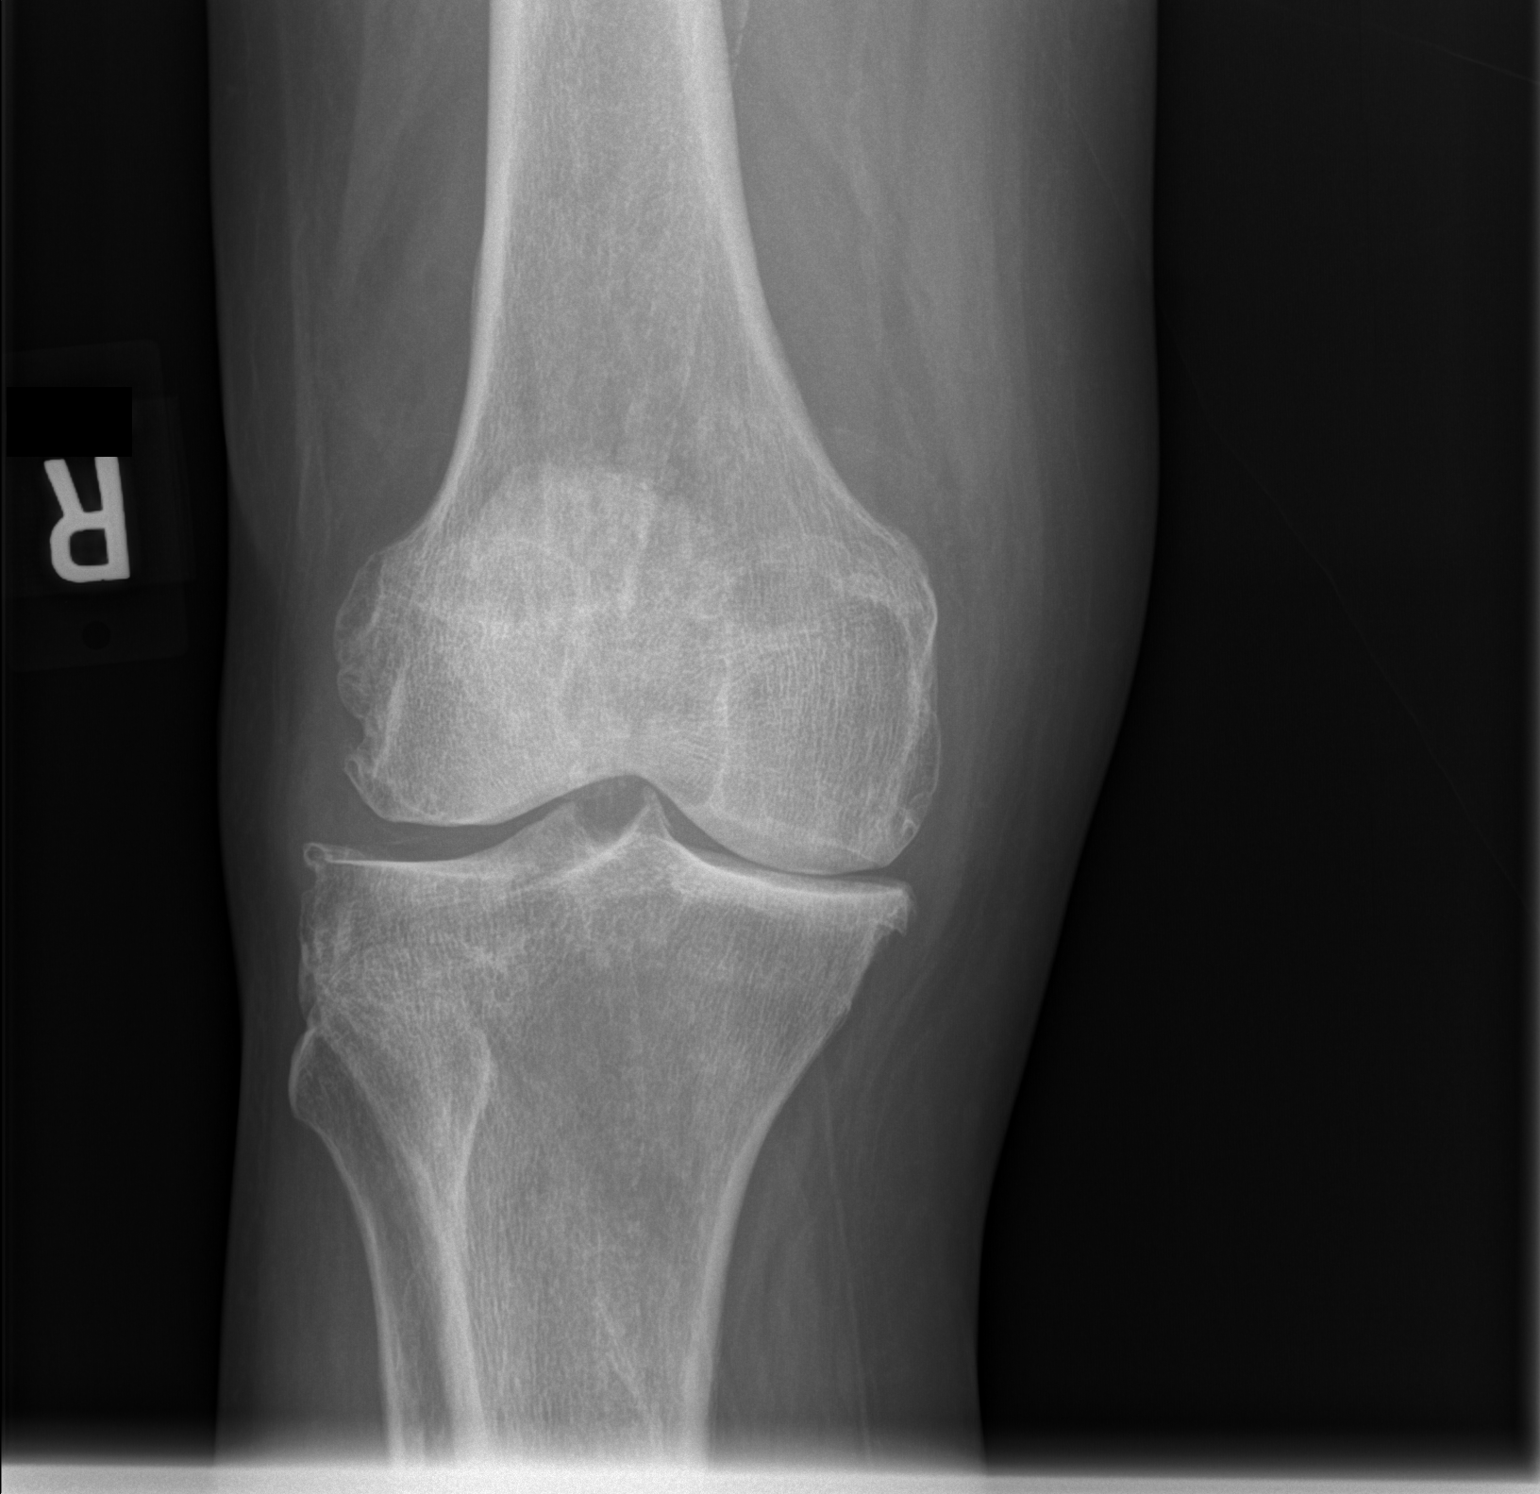
[im 2/2]
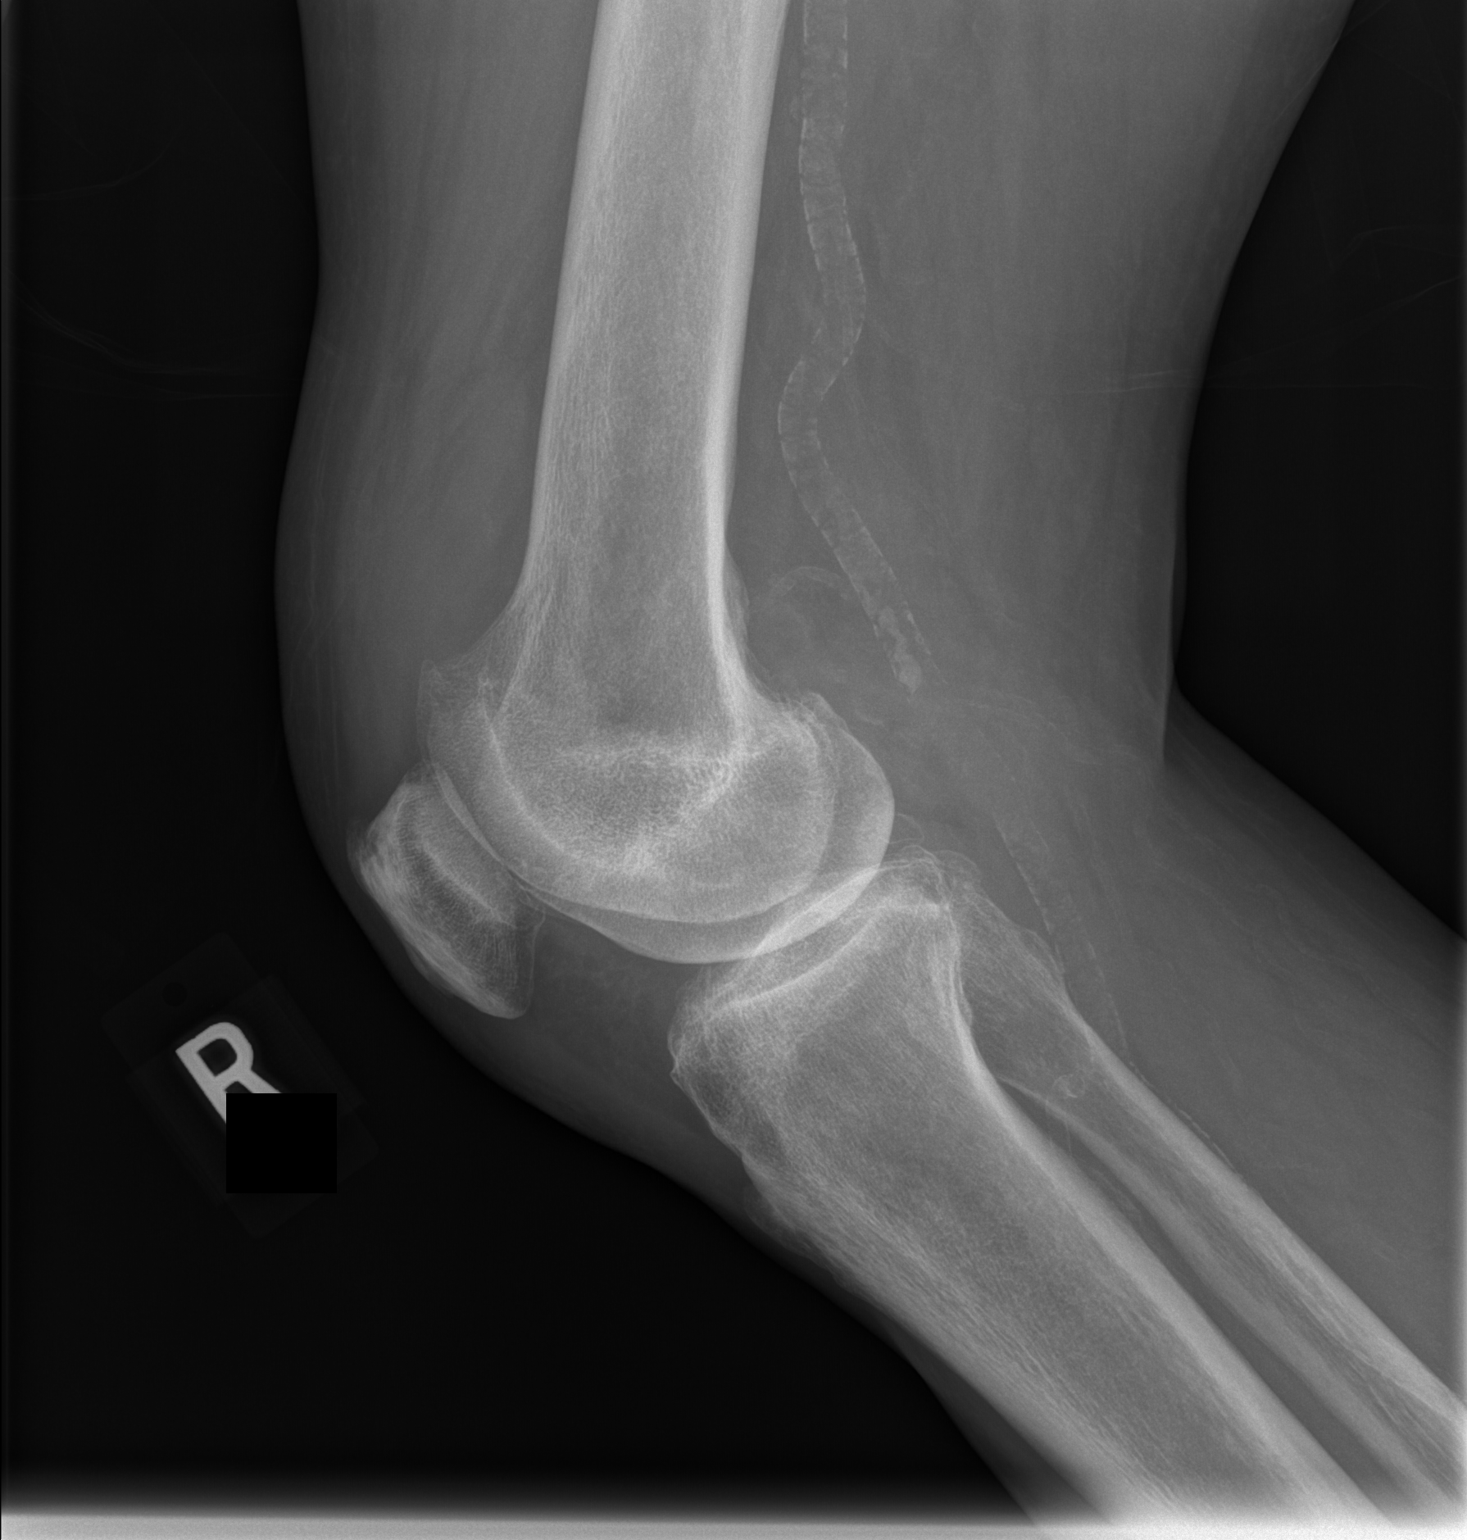

[2 of 2 positions shown; findings below may reference images not displayed]

PROCEDURE:     DXR - DXR KNEE RIGHT AP AND LATERAL  - [DATE]  [DATE]

RESULT:     Right knee images demonstrate medial compartmental narrowing
with subchondral sclerosis in the tibial plateau and marginal hypertrophic
spurring medially and laterally. Patellofemoral joint space narrowing is
present. Prominent atherosclerotic calcification is present.
IMPRESSION: Degenerative changes especially in the medial compartment.
No acute bony abnormality evident.

[REDACTED]

## 2012-08-23 IMAGING — CR DG KNEE 1-2V*L*
1 series · 2 of 2 positions shown · non-contrast
Comparison: none

REASON FOR EXAM: swollen legs DM both arm achingHBP back pain
COMMENTS:

PROCEDURE:     DXR - DXR KNEE LEFT AP AND LATERAL  - [DATE]  [DATE]
RESULT:     Prominent atherosclerotic calcification is present. To images
show no definite fracture, dislocation or foreign body. Marginal
hypertrophic spurring is present.

[Series 1: t knee ap left · 0.14mm/px · 2 of 2 slices shown]
[im 1/2]
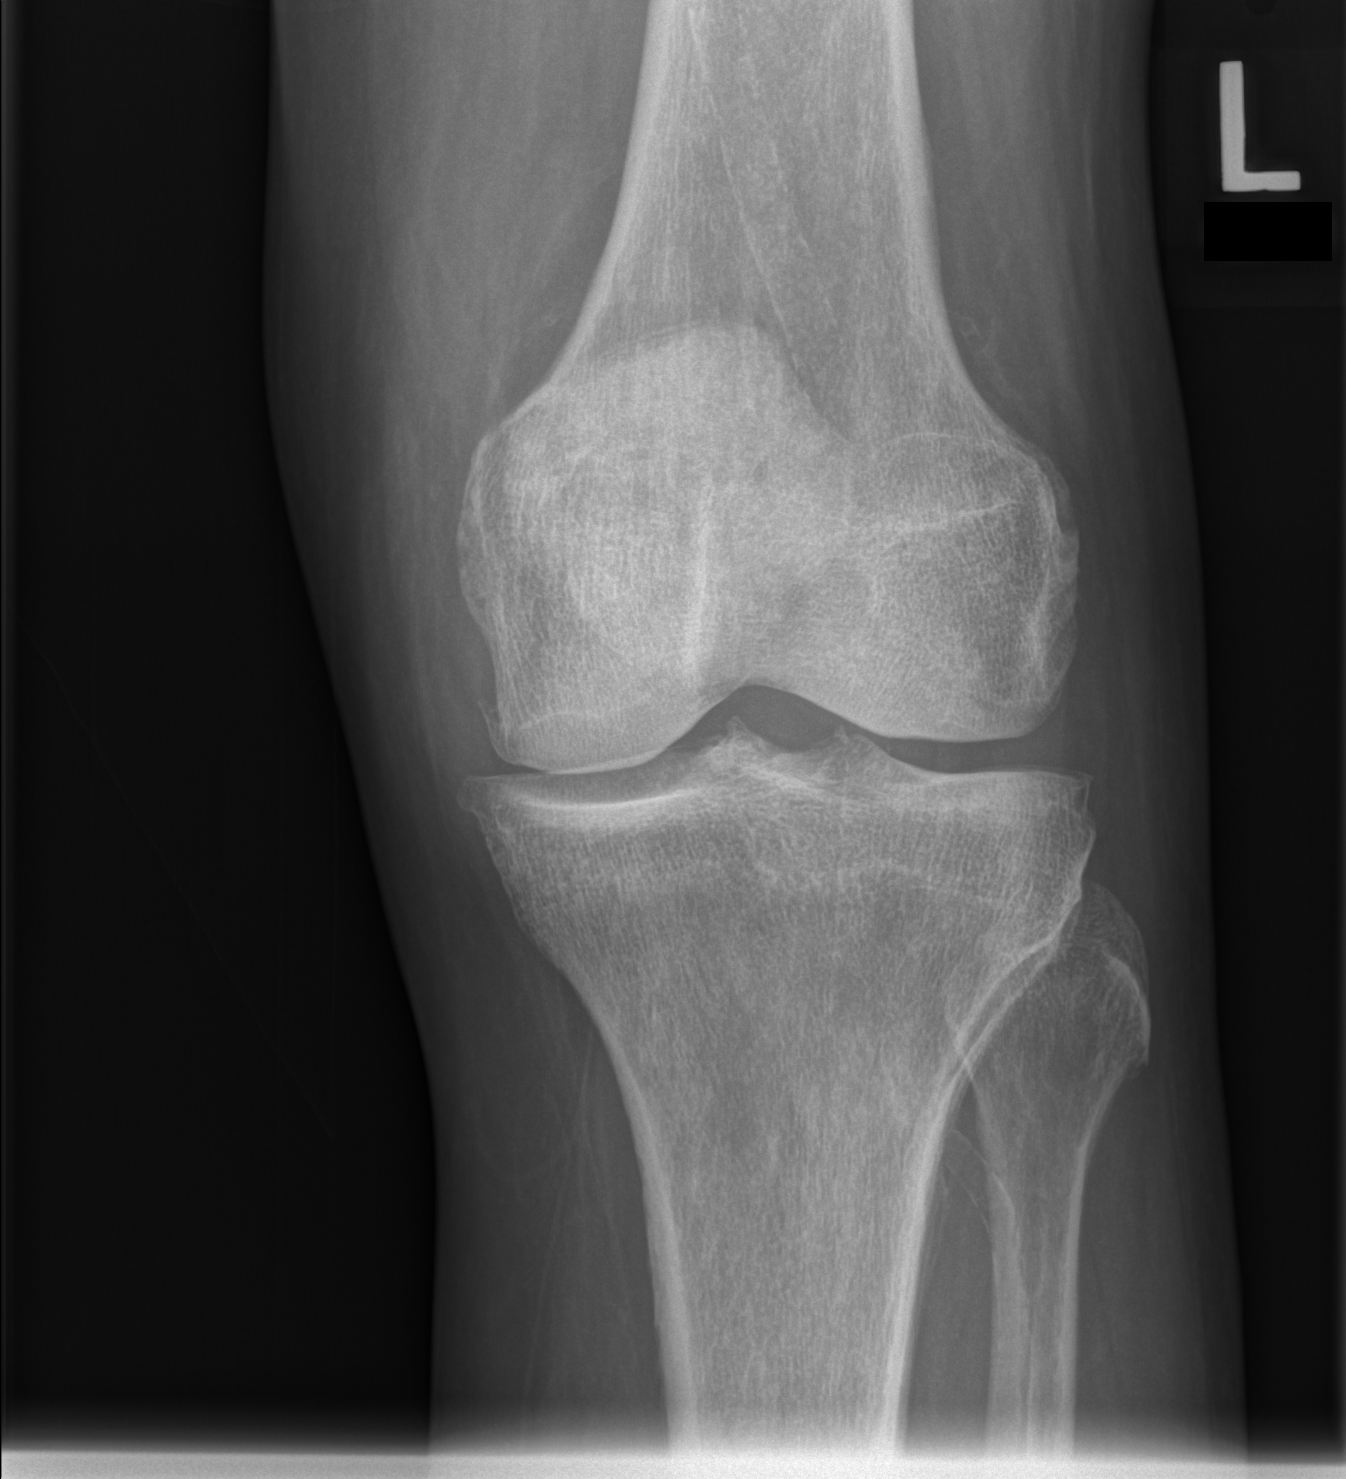
[im 2/2]
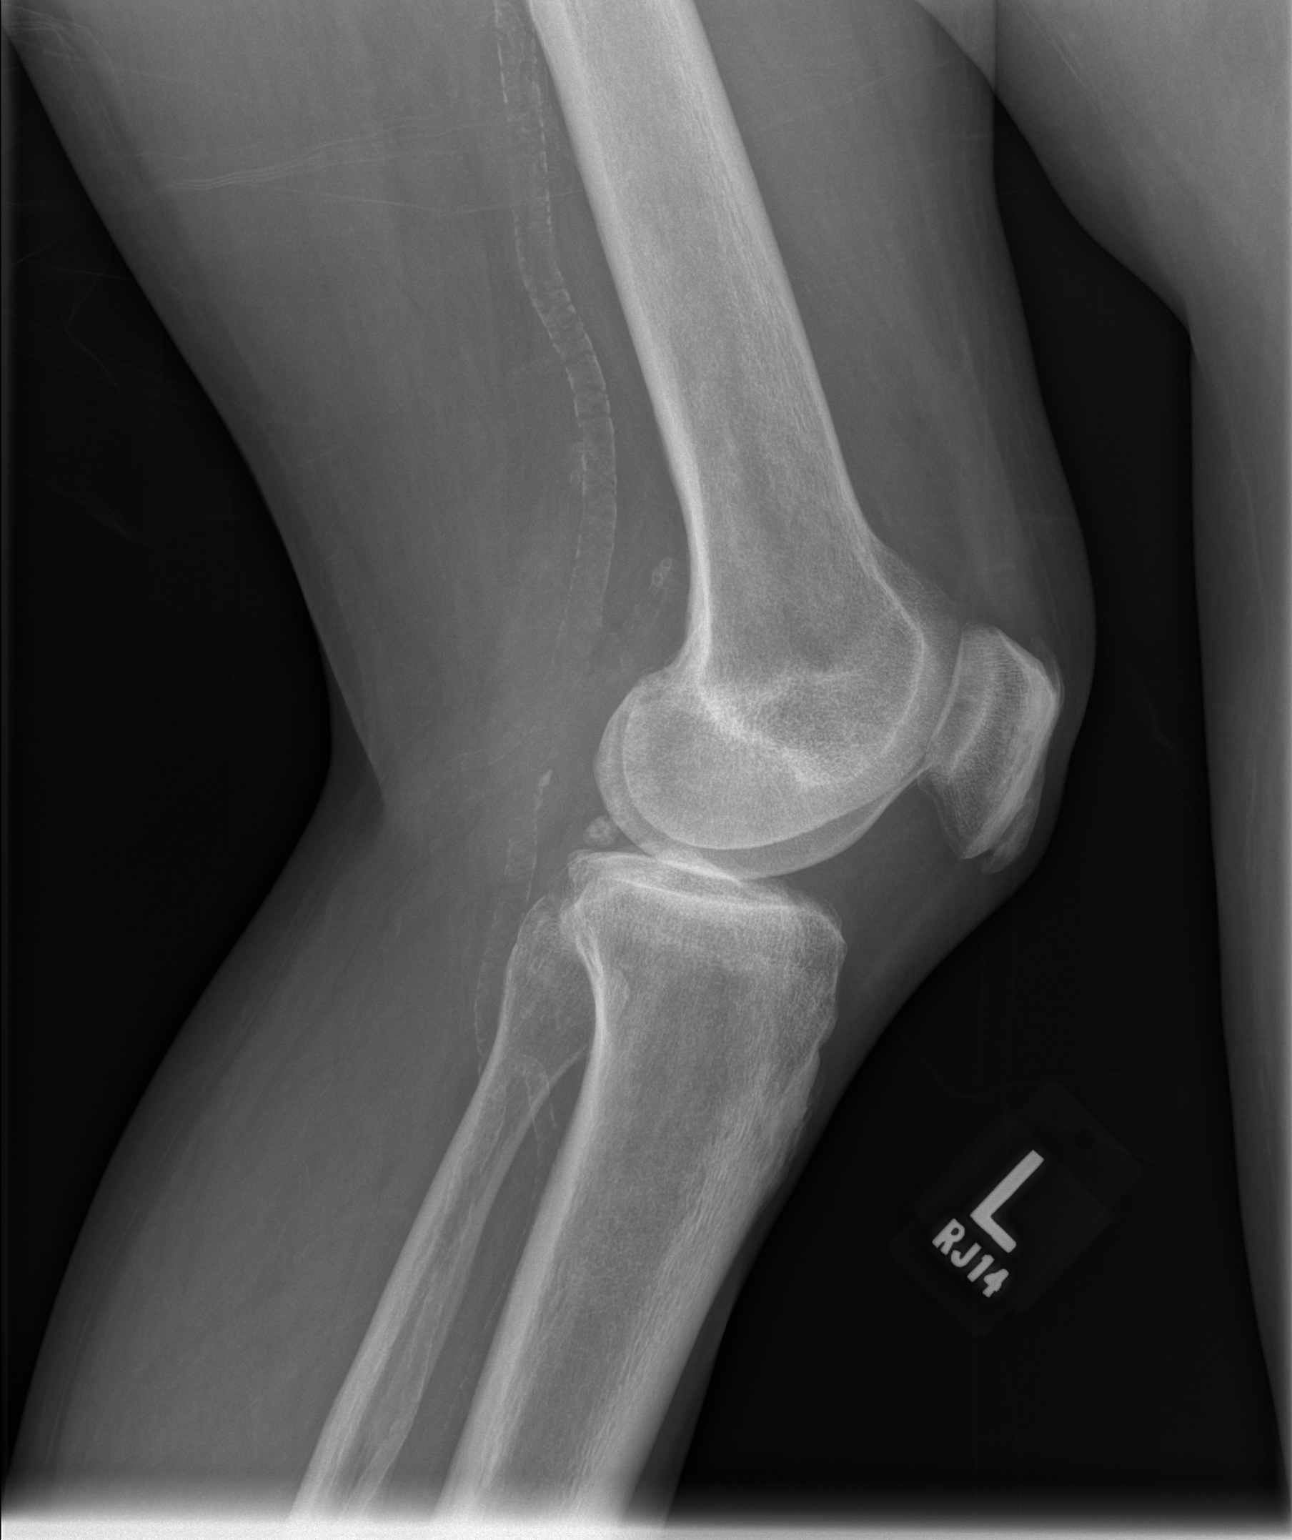

[2 of 2 positions shown; findings below may reference images not displayed]

IMPRESSION: No acute bony abnormality. Atherosclerotic calcification
present.

[REDACTED]

## 2013-03-18 ENCOUNTER — Ambulatory Visit: Payer: Self-pay

## 2013-03-18 IMAGING — CR DG LUMBAR SPINE 2-3V
1 series · 3 of 3 positions shown · non-contrast
Comparison: none

REASON FOR EXAM: arthritis HBP, diabetes, leg pain and back pain  fax
[PHONE_NUMBER]
COMMENTS:

[Series 1: t lumbar spine ap · 0.14mm/px · 3 of 3 slices shown]
[im 1/3]
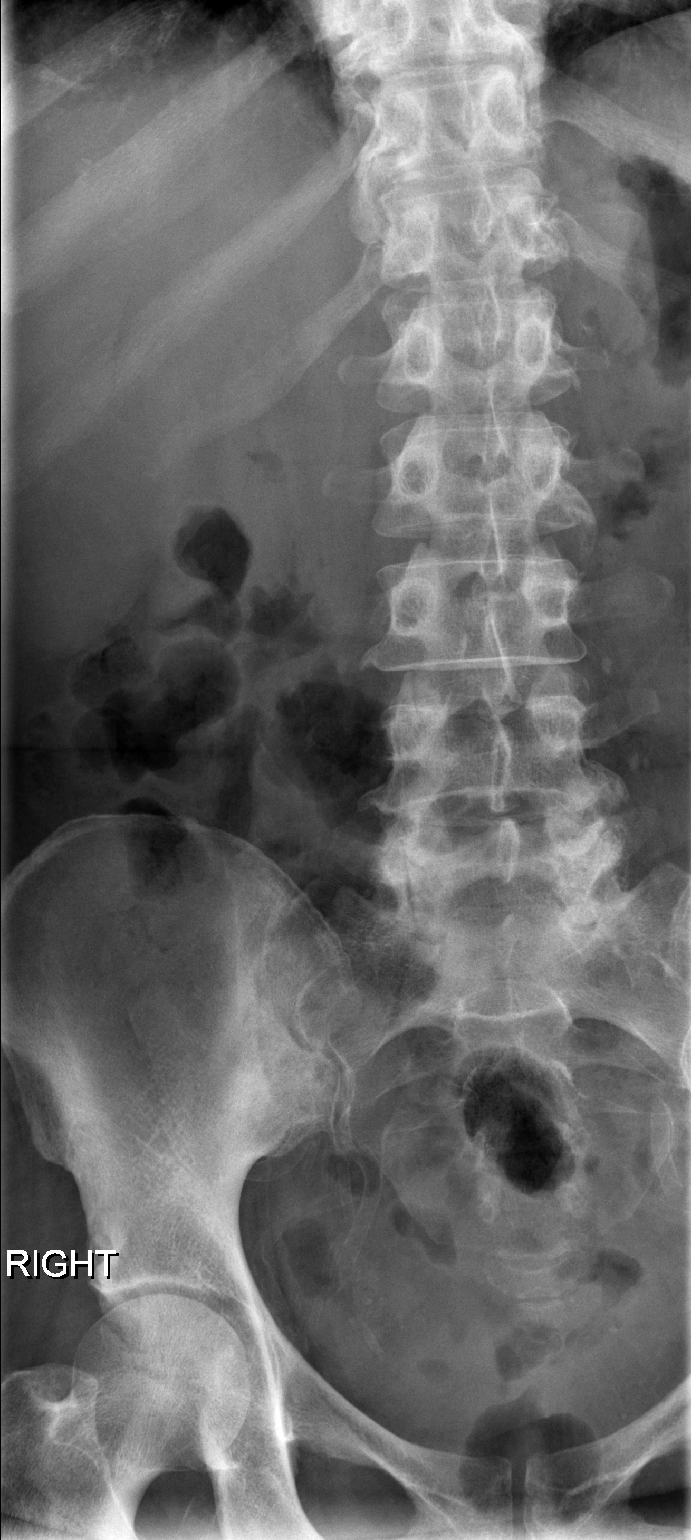
[im 2/3]
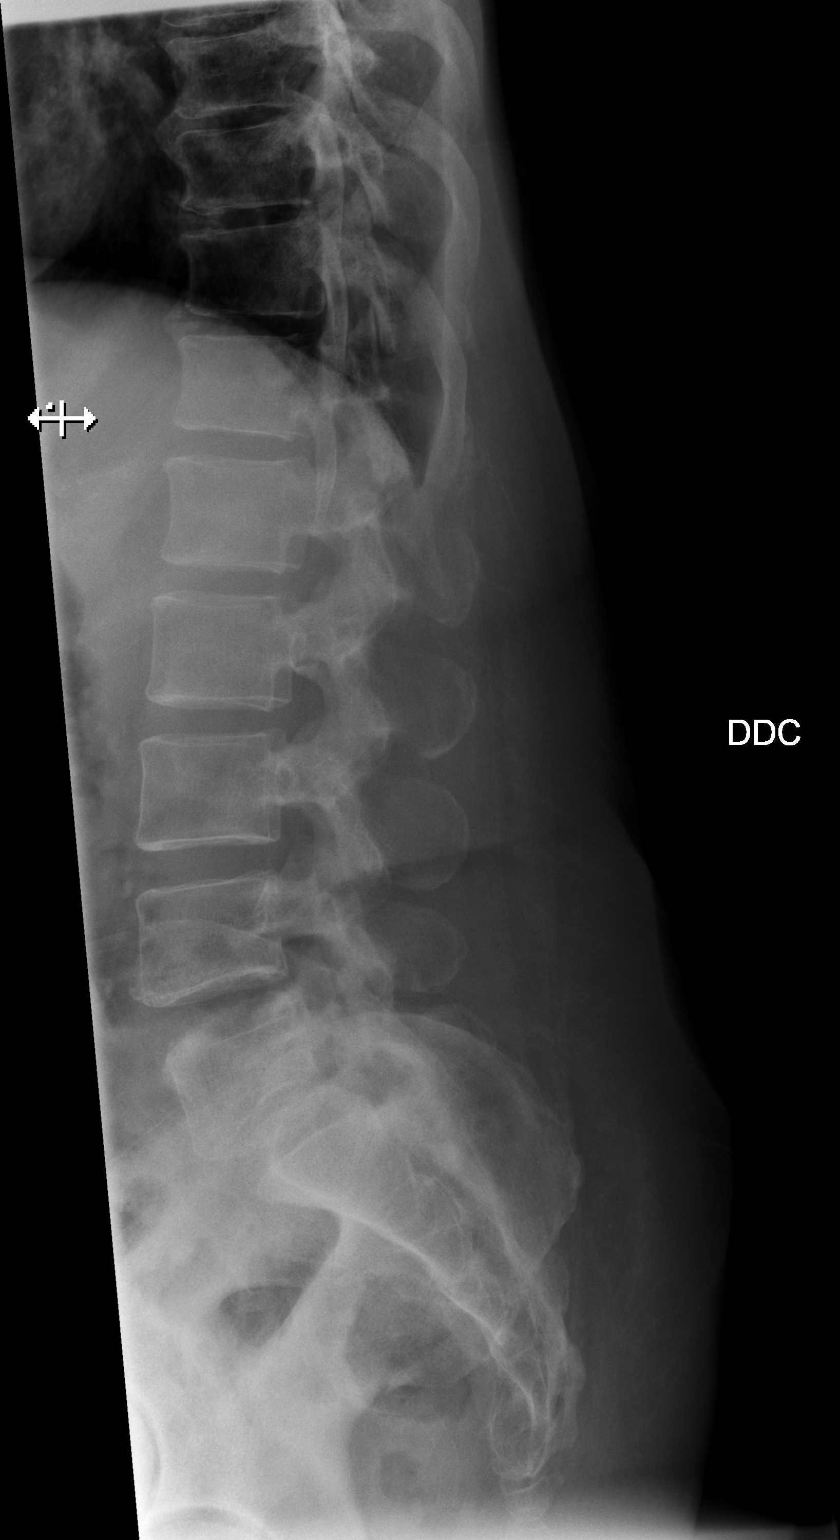
[im 3/3]
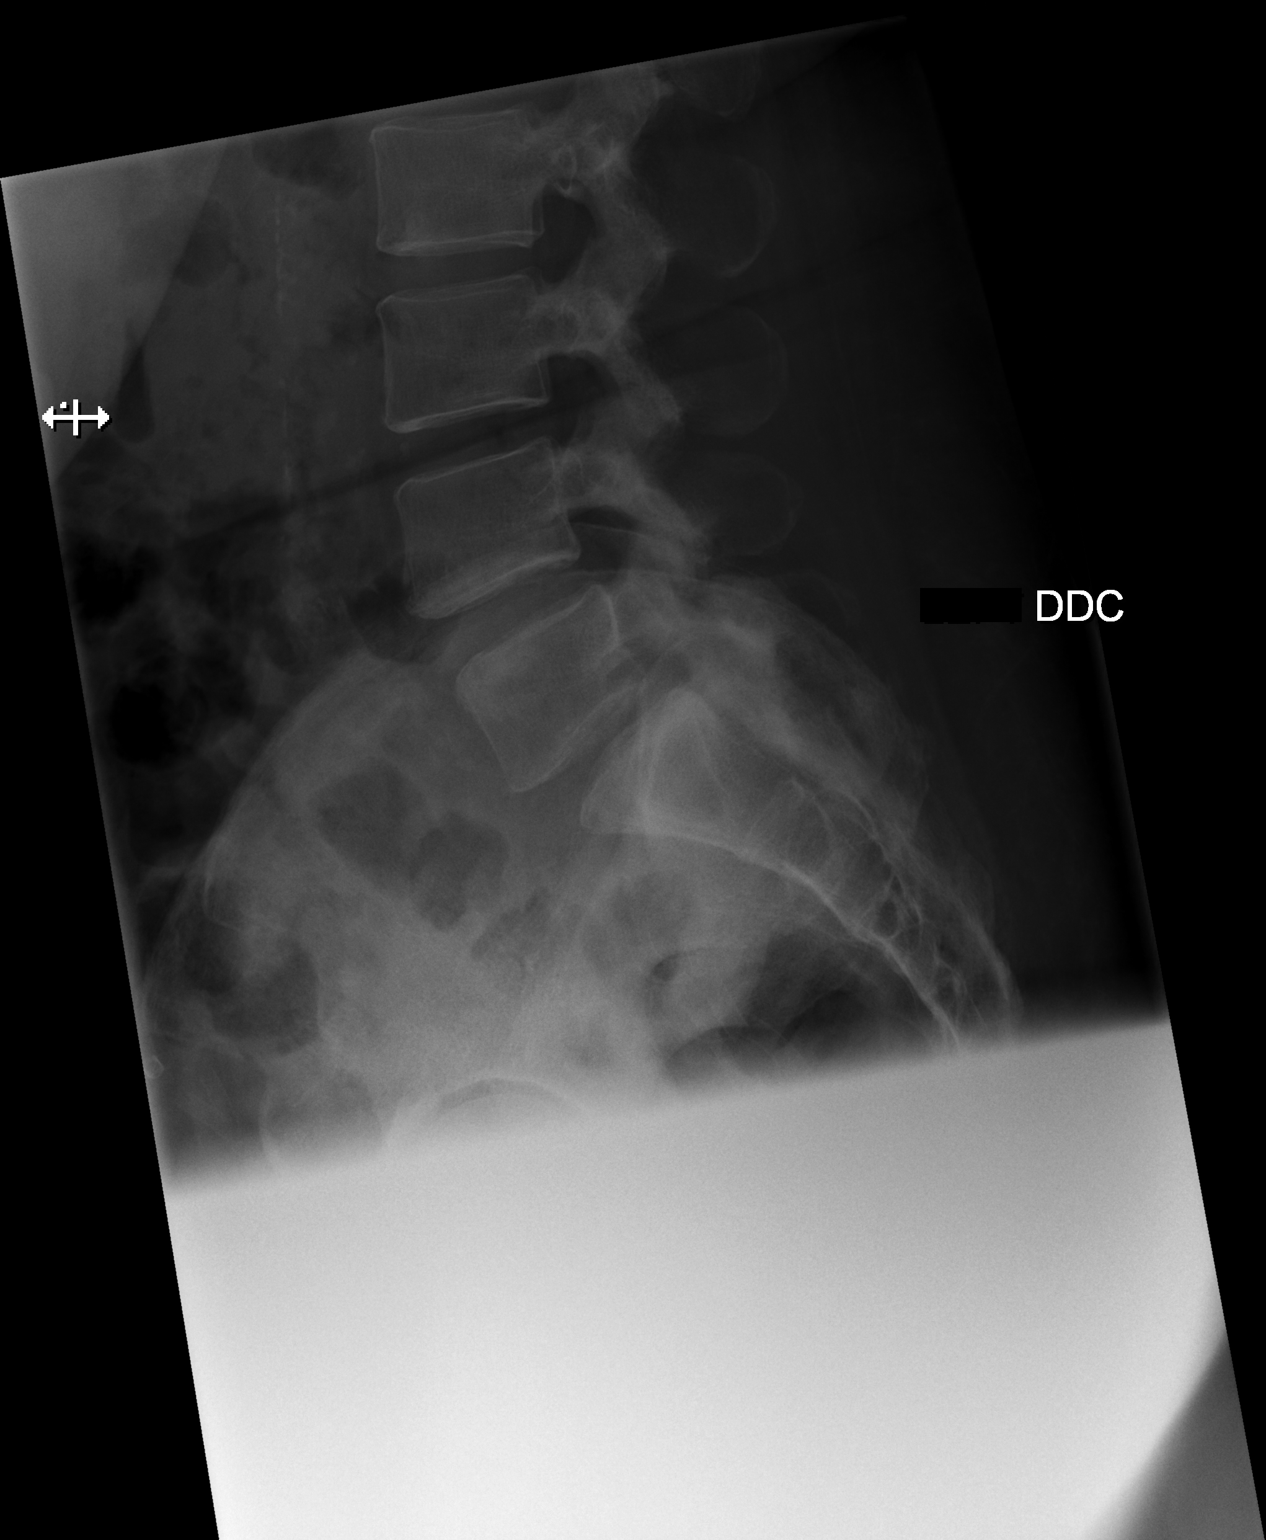

[3 of 3 positions shown; findings below may reference images not displayed]

PROCEDURE:     DXR - DXR LUMBAR SPINE AP AND LATERAL  - [DATE]  [DATE]

RESULT:     The lumbar vertebral bodies are preserved in height. The
intervertebral disc space heights are well-maintained. Minimal
anterolisthesis of L5 with respect to S1 is present likely on the basis of
degenerative disc and facet joint change. There is no evidence of a
transverse process fracture.
IMPRESSION: 1. There is no evidence of a compression fracture nor high-grade disc space
narrowing.
2. There is minimal grade 1 anterolisthesis of L5 with respect to S1 likely
on the basis mild degenerative disc and facet joint change.

[REDACTED]

## 2013-08-15 ENCOUNTER — Emergency Department: Payer: Self-pay | Admitting: Emergency Medicine

## 2013-08-15 LAB — COMPREHENSIVE METABOLIC PANEL
Albumin: 3.1 g/dL — ABNORMAL LOW (ref 3.4–5.0)
Alkaline Phosphatase: 98 U/L
Anion Gap: 4 — ABNORMAL LOW (ref 7–16)
BUN: 7 mg/dL (ref 7–18)
Bilirubin,Total: 1.1 mg/dL — ABNORMAL HIGH (ref 0.2–1.0)
CREATININE: 0.62 mg/dL (ref 0.60–1.30)
Calcium, Total: 8.9 mg/dL (ref 8.5–10.1)
Chloride: 106 mmol/L (ref 98–107)
Co2: 29 mmol/L (ref 21–32)
EGFR (African American): >60
EGFR (Non-African Amer.): >60
Glucose: 74 mg/dL (ref 65–99)
OSMOLALITY: 274 (ref 275–301)
Potassium: 3.5 mmol/L (ref 3.5–5.1)
SGOT(AST): 34 U/L (ref 15–37)
SGPT (ALT): 15 U/L (ref 12–78)
Sodium: 139 mmol/L (ref 136–145)
Total Protein: 9.1 g/dL — ABNORMAL HIGH (ref 6.4–8.2)

## 2013-08-15 LAB — CBC
HCT: 38.8 % (ref 35.0–47.0)
HGB: 13.1 g/dL (ref 12.0–16.0)
MCH: 31 pg (ref 26.0–34.0)
MCHC: 33.6 g/dL (ref 32.0–36.0)
MCV: 92 fL (ref 80–100)
Platelet: 235 10*3/uL (ref 150–440)
RBC: 4.21 10*6/uL (ref 3.80–5.20)
RDW: 14.9 % — ABNORMAL HIGH (ref 11.5–14.5)
WBC: 6.3 10*3/uL (ref 3.6–11.0)

## 2013-08-15 LAB — LIPASE, BLOOD: LIPASE: 72 U/L — AB (ref 73–393)

## 2013-08-15 IMAGING — US ABDOMEN ULTRASOUND LIMITED
1 series · 14 of 25 positions shown · non-contrast
Comparison: DG LUMBAR SPINE 2-3V dated [DATE]; US ABDOMEN
COMPLETE dated [DATE]

CLINICAL DATA: Right upper quadrant pain and postprandial vomiting

EXAM:
US ABDOMEN LIMITED - RIGHT UPPER QUADRANT

[Series 1: abdomen ultrasound limited · 0.26mm/px · 14 of 48 slices shown]
[im 1/48]
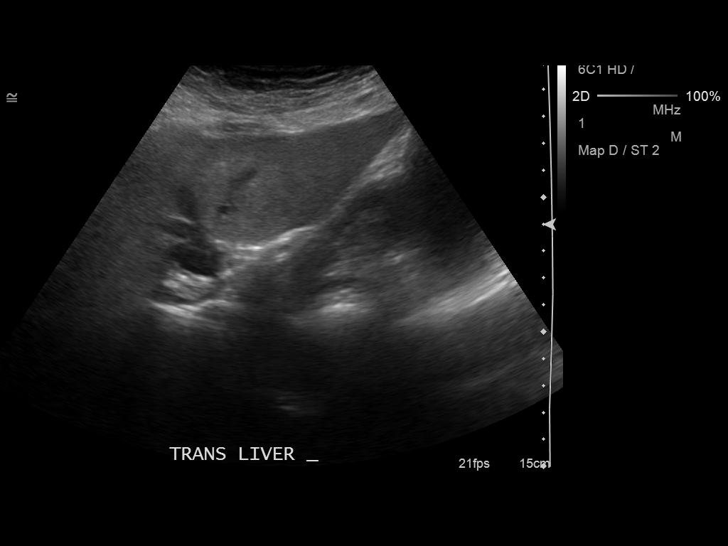
[im 4/48]
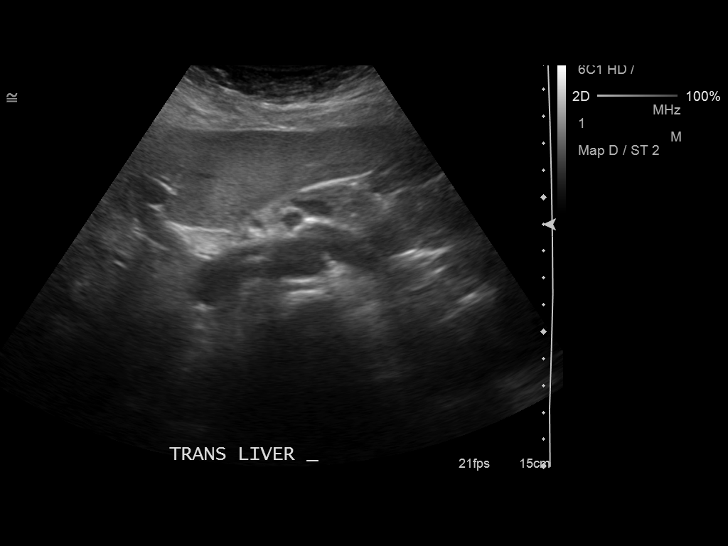
[im 8/48]
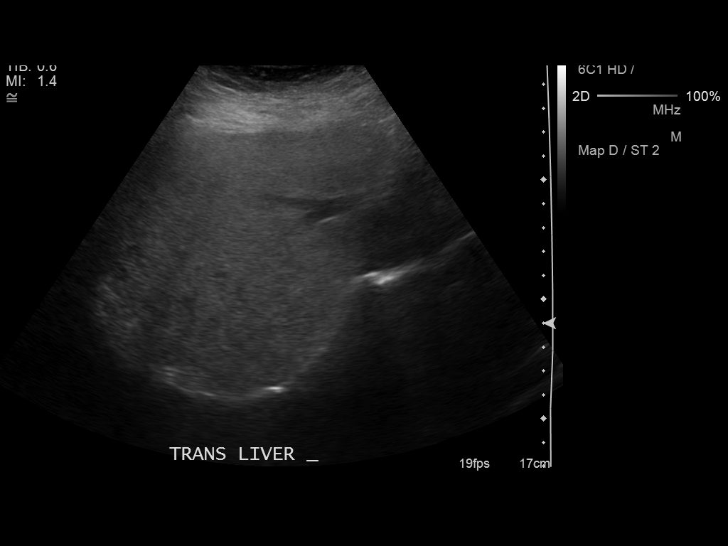
[im 12/48]
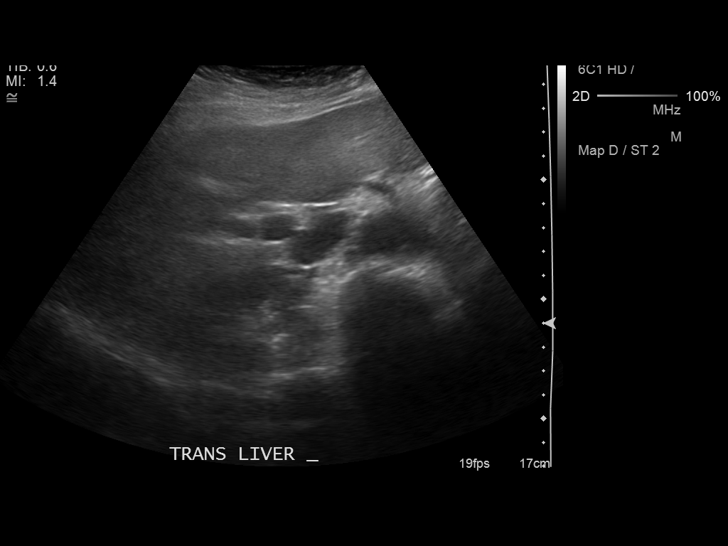
[im 16/48]
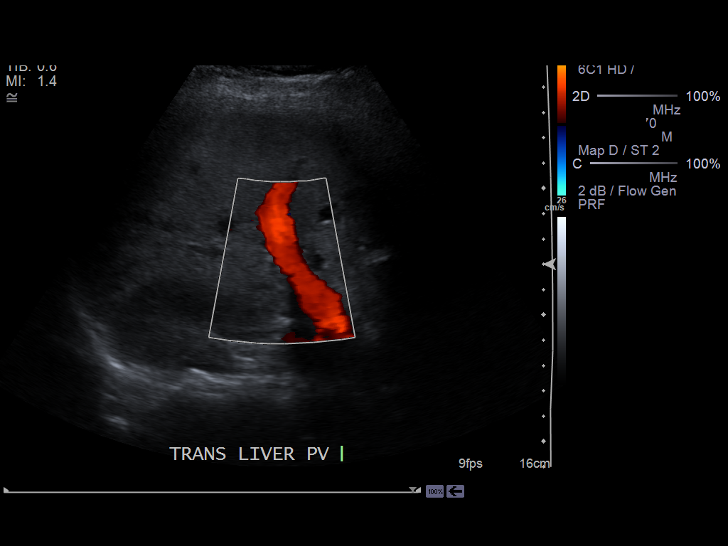
[im 18/48]
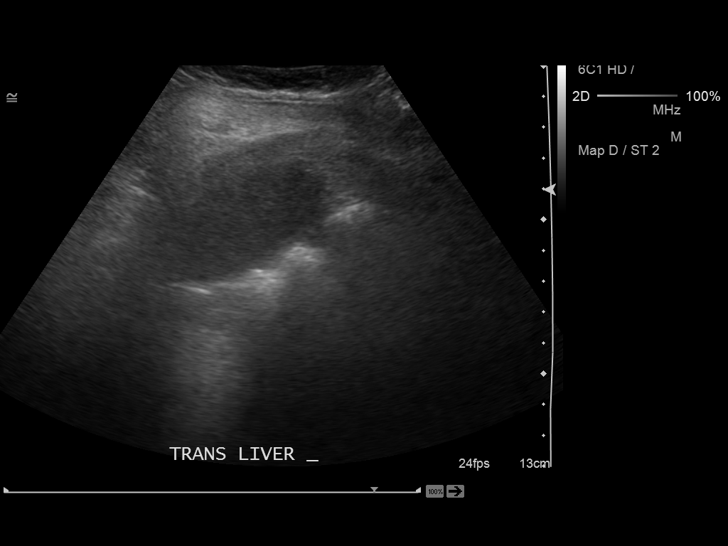
[im 22/48]
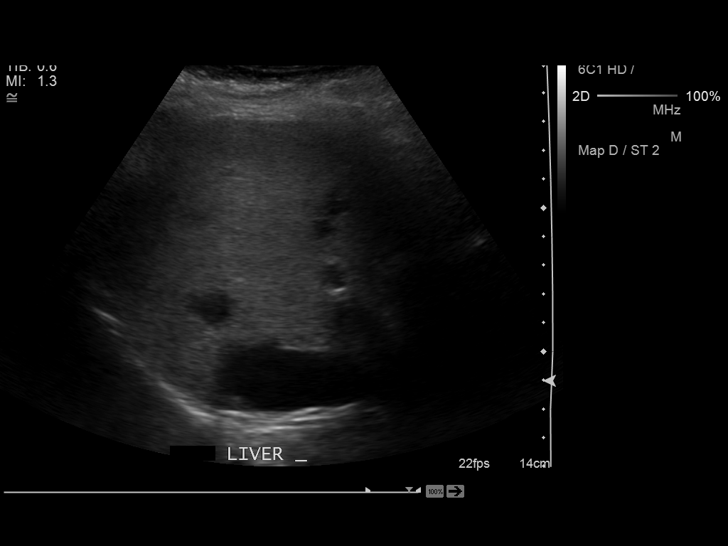
[im 26/48]
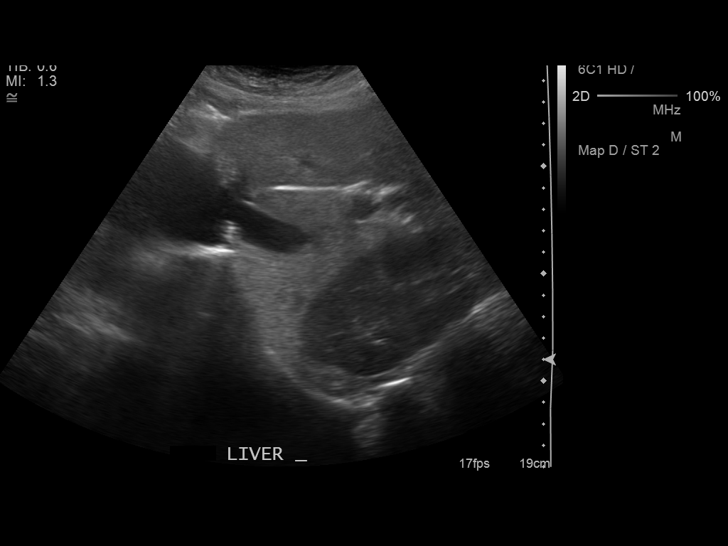
[im 30/48]
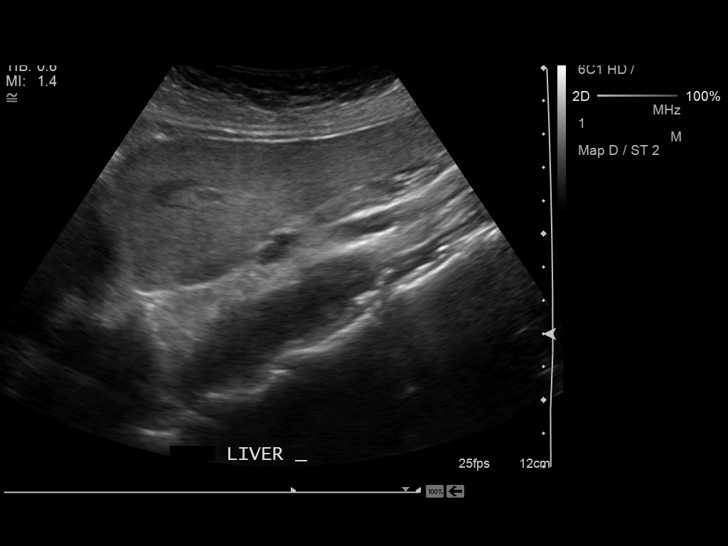
[im 32/48]
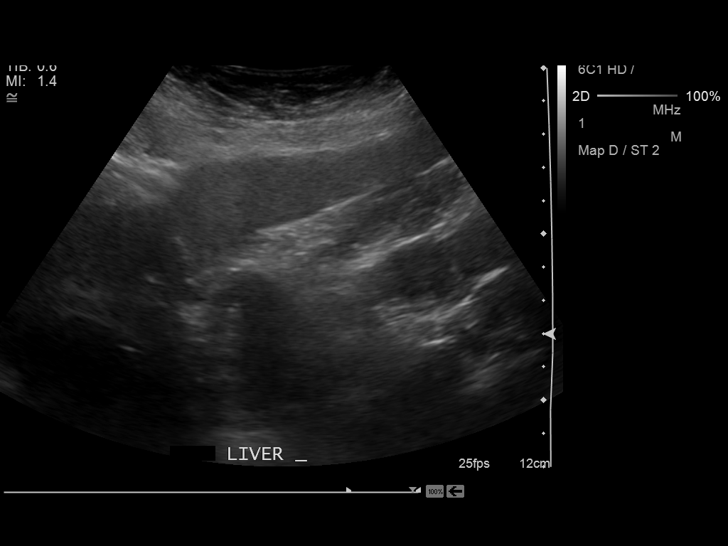
[im 36/48]
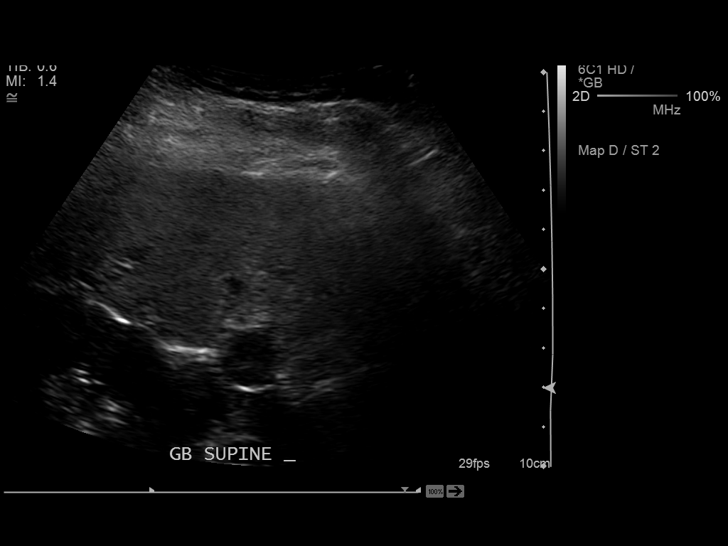
[im 40/48]
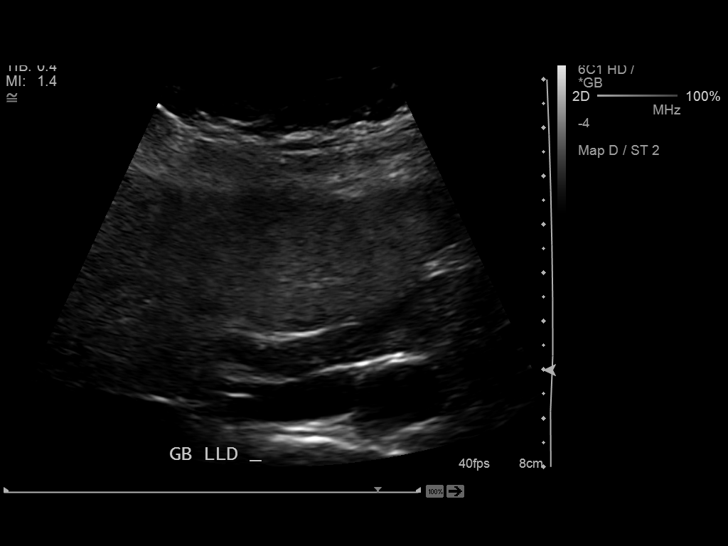
[im 44/48]
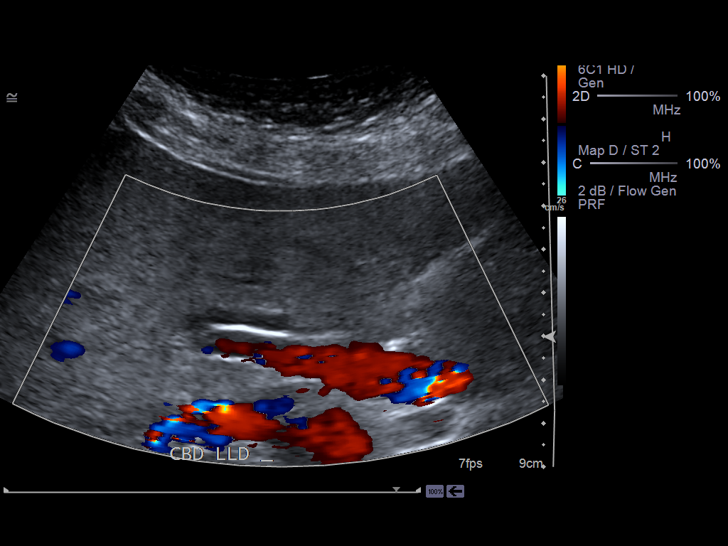
[im 48/48]
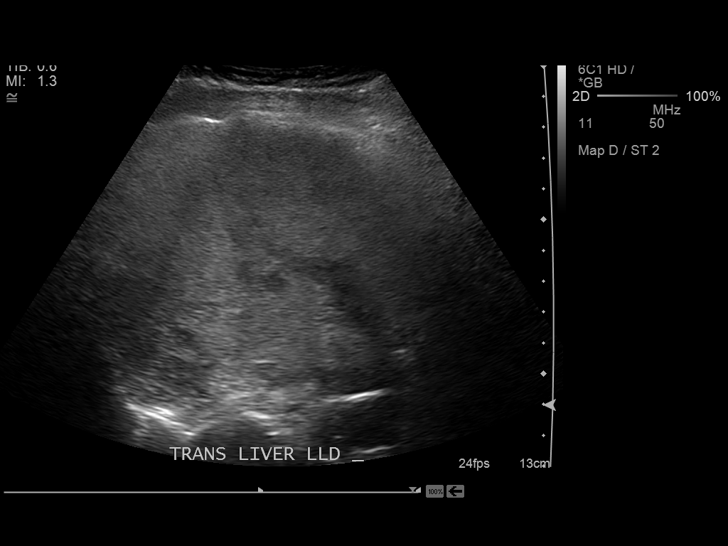

[14 of 25 positions shown; findings below may reference images not displayed]

FINDINGS: Gallbladder:

The gallbladder is reasonably well distended. A few internal echoes
are demonstrated. No echogenic shadowing stones are demonstrated.
There is no gallbladder wall thickening nor pericholecystic fluid.
There is no positive sonographic Murphy's sign.

Common bile duct:

Diameter: 3.9 mm.

Liver:

The liver demonstrates very mildly increased echotexture. There is
no focal mass. There is mild intrahepatic ductal dilation.
IMPRESSION: 1. The gallbladder is adequately distended. There is no sonographic
evidence of acute cholecystitis but sludge cannot be excluded.
2. The common bile duct is normal in diameter.
3. Mild intrahepatic ductal dilation is demonstrated.
4. CT scanning of the abdomen and pelvis may be of value.

## 2014-07-18 ENCOUNTER — Emergency Department: Payer: Self-pay | Admitting: Emergency Medicine

## 2015-02-22 IMAGING — US US ABDOMEN LIMITED
1 series · 14 of 25 positions shown · non-contrast
Comparison: CT scan of same day.  Ultrasound [DATE].

CLINICAL DATA: Vomiting.

EXAM:
US ABDOMEN LIMITED - RIGHT UPPER QUADRANT

[Series 1: us abdomen limited · 0.17mm/px · 14 of 48 slices shown]
[im 1/48]
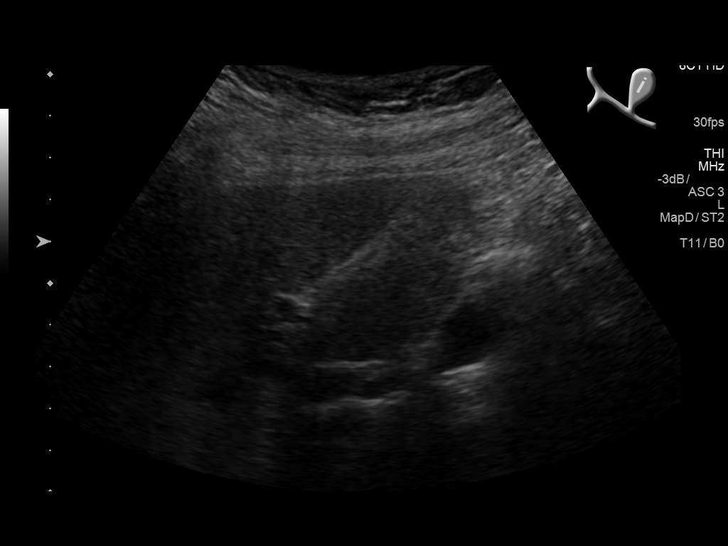
[im 4/48]
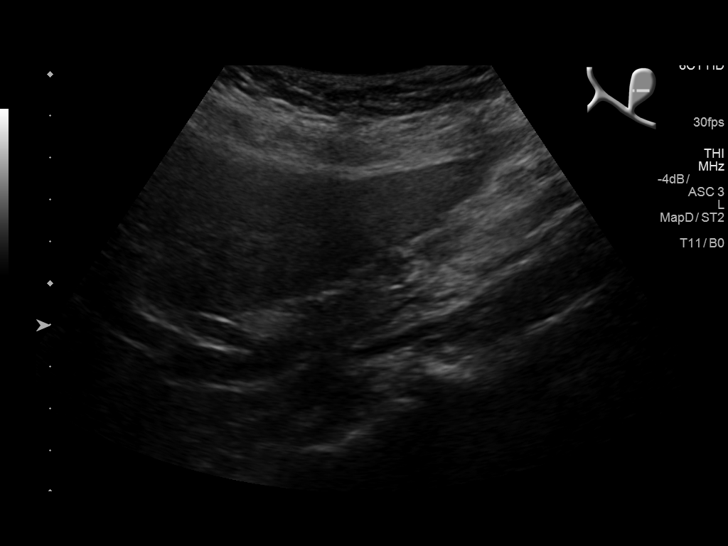
[im 8/48]
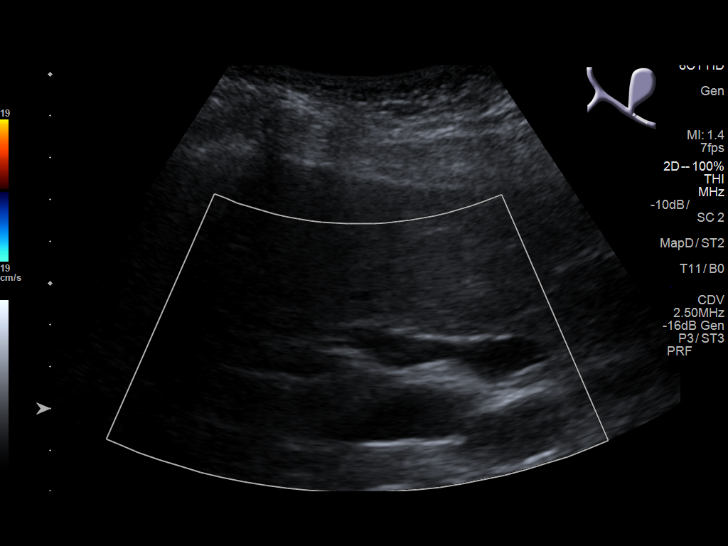
[im 12/48]
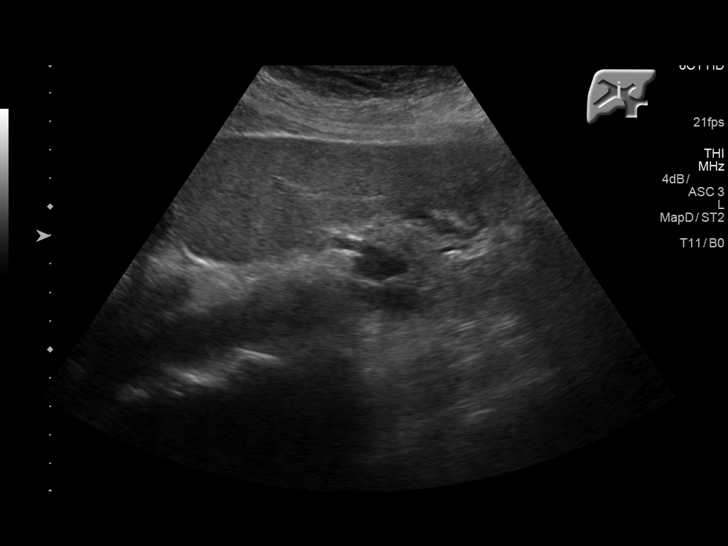
[im 16/48]
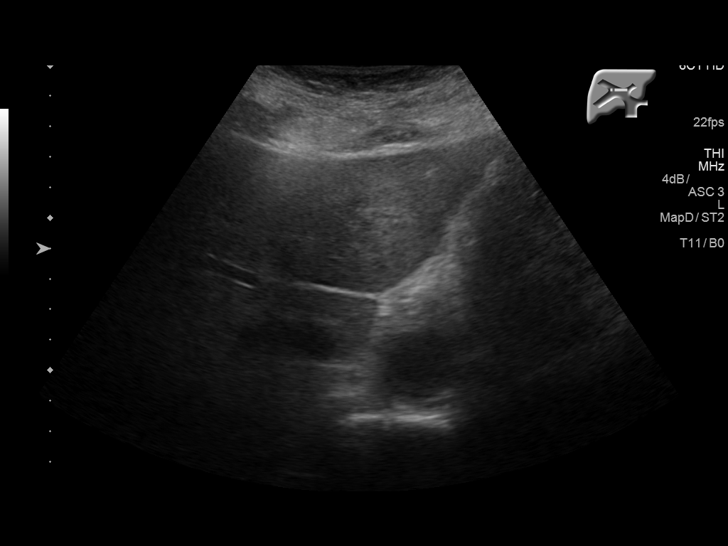
[im 18/48]
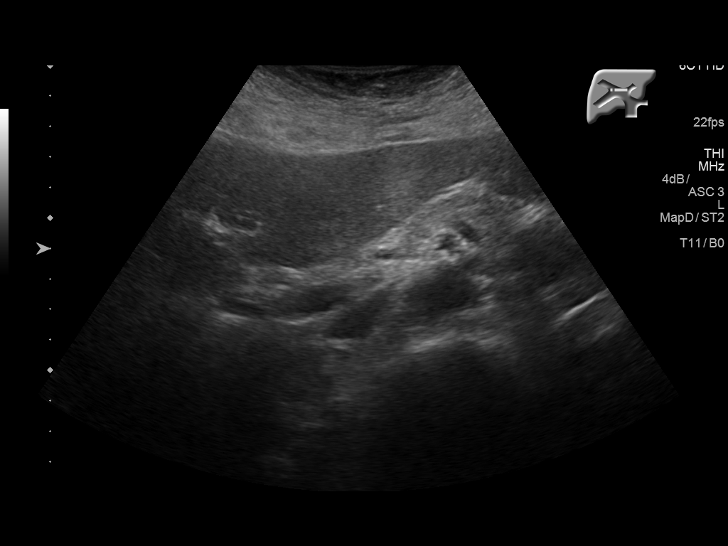
[im 22/48]
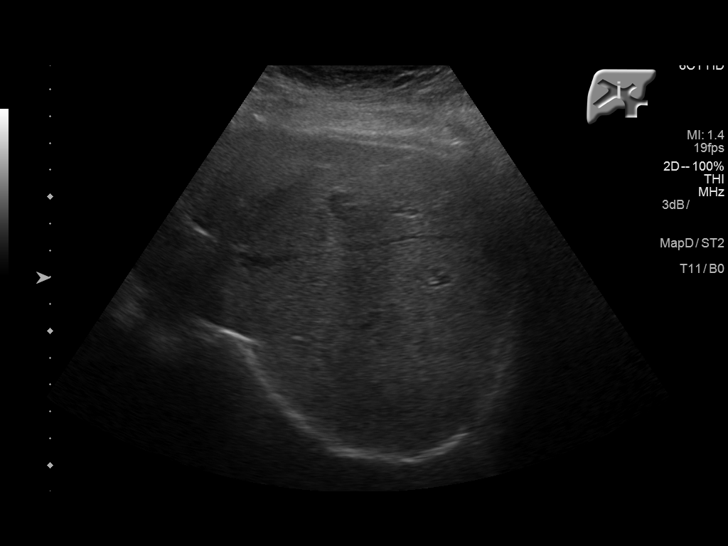
[im 26/48]
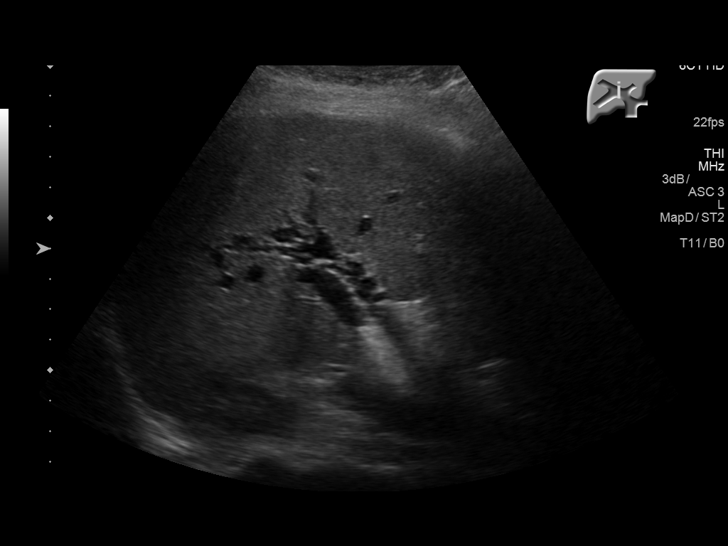
[im 30/48]
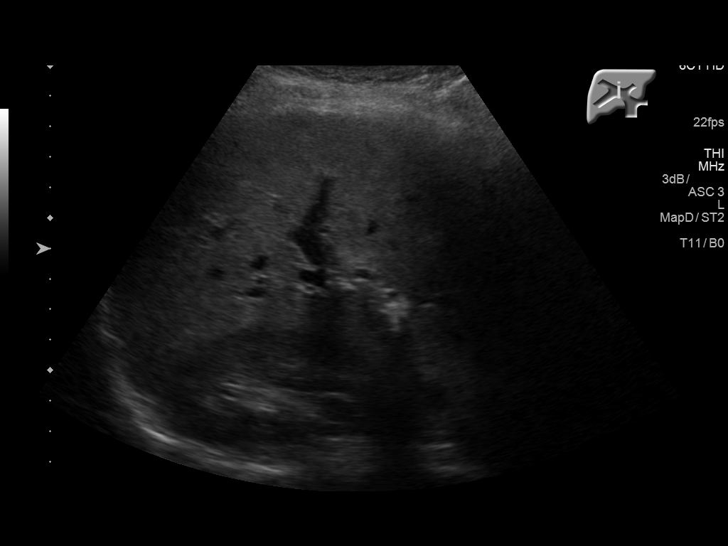
[im 32/48]
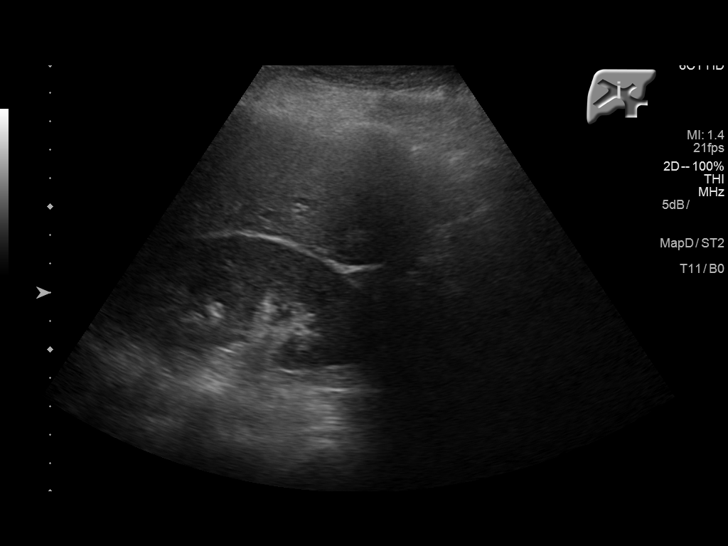
[im 36/48]
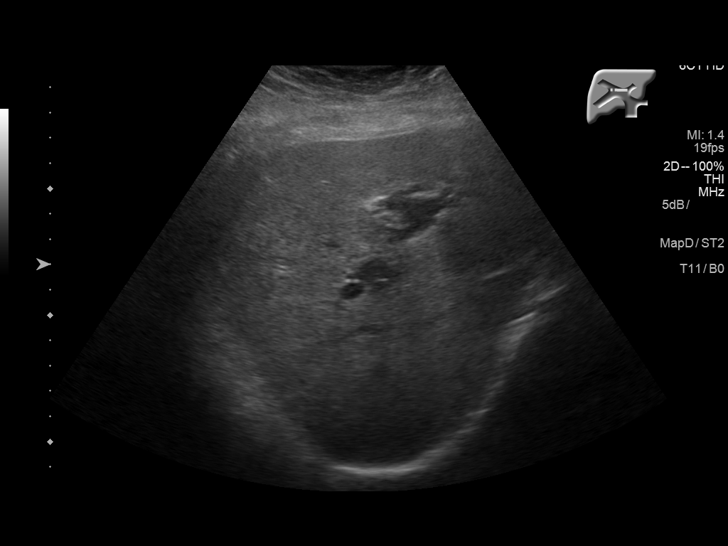
[im 40/48]
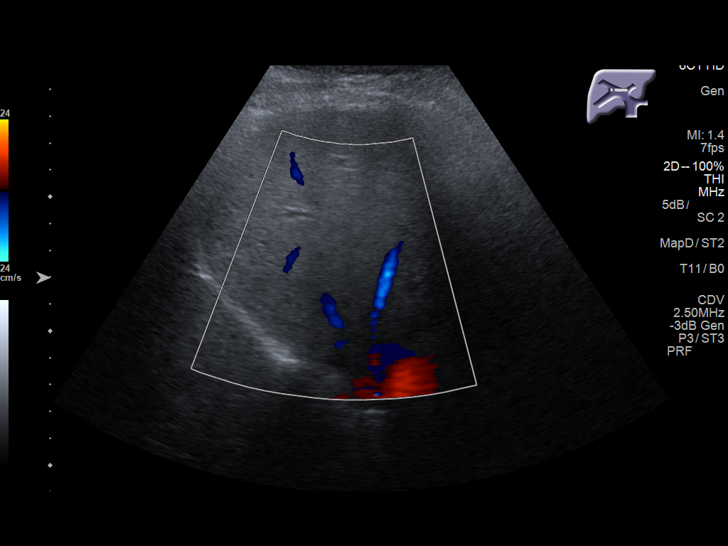
[im 44/48]
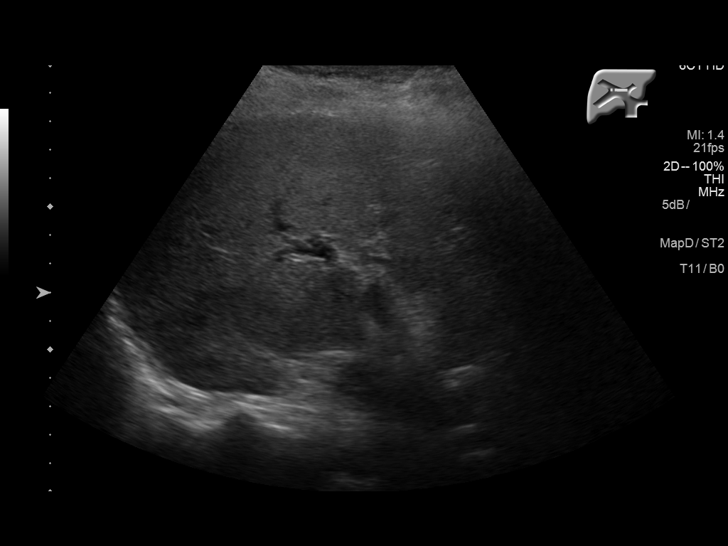
[im 48/48]
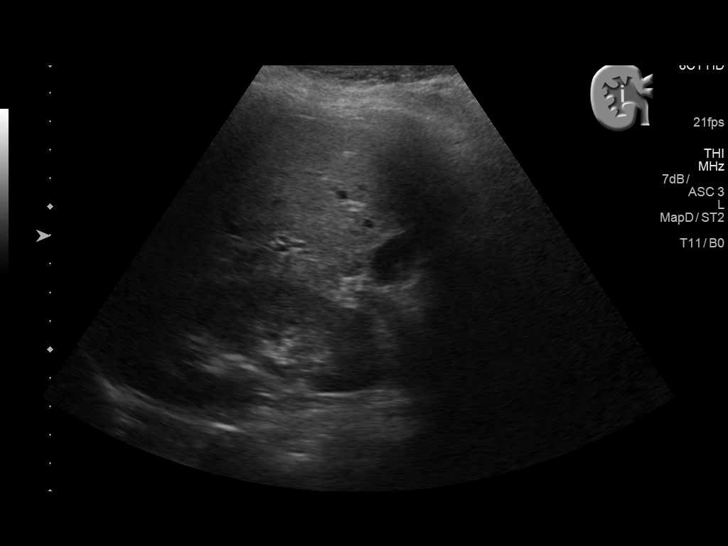

[14 of 25 positions shown; findings below may reference images not displayed]

FINDINGS: Gallbladder:

Gallbladder appears to be contracted. No gallstones or wall
thickening visualized. No sonographic Murphy sign noted by
sonographer.

Common bile duct:

Diameter: Mildly dilated measuring 6.5 mm distally.

Liver:

Echogenicity is within normal limits. Mild intrahepatic biliary
dilatation is noted.
IMPRESSION: Gallbladder appears to be contracted. Mild intrahepatic and
extrahepatic biliary dilatation is noted; correlation with liver
function tests is recommended to rule out distal common bile duct
obstruction.

## 2015-06-11 ENCOUNTER — Emergency Department
Admission: EM | Admit: 2015-06-11 | Discharge: 2015-06-11 | Disposition: A | Discharge status: Home / Self Care | Payer: Medicaid Other | Attending: Emergency Medicine | Admitting: Emergency Medicine

## 2015-06-11 ENCOUNTER — Encounter: Payer: Self-pay | Admitting: Emergency Medicine

## 2015-06-11 DIAGNOSIS — Z87891 Personal history of nicotine dependence: Secondary | ICD-10-CM | POA: Insufficient documentation

## 2015-06-11 DIAGNOSIS — R112 Nausea with vomiting, unspecified: Secondary | ICD-10-CM | POA: Diagnosis present

## 2015-06-11 DIAGNOSIS — R1012 Left upper quadrant pain: Secondary | ICD-10-CM | POA: Diagnosis not present

## 2015-06-11 DIAGNOSIS — E860 Dehydration: Secondary | ICD-10-CM | POA: Insufficient documentation

## 2015-06-11 LAB — CBC WITH DIFFERENTIAL/PLATELET
Basophils Absolute: 0 10*3/uL (ref 0–0.1)
Basophils Relative: 0 %
Eosinophils Absolute: 0 10*3/uL (ref 0–0.7)
Eosinophils Relative: 0 %
HEMATOCRIT: 36.6 % (ref 35.0–47.0)
Hemoglobin: 12.2 g/dL (ref 12.0–16.0)
LYMPHS PCT: 11 %
Lymphs Abs: 0.6 10*3/uL — ABNORMAL LOW (ref 1.0–3.6)
MCH: 29.9 pg (ref 26.0–34.0)
MCHC: 33.4 g/dL (ref 32.0–36.0)
MCV: 89.4 fL (ref 80.0–100.0)
MONO ABS: 0.6 10*3/uL (ref 0.2–0.9)
MONOS PCT: 10 %
NEUTROS ABS: 4.2 10*3/uL (ref 1.4–6.5)
Neutrophils Relative %: 77 %
Platelets: 170 10*3/uL (ref 150–440)
RBC: 4.1 MIL/uL (ref 3.80–5.20)
RDW: 15.7 % — AB (ref 11.5–14.5)
WBC: 5.4 10*3/uL (ref 3.6–11.0)

## 2015-06-11 LAB — COMPREHENSIVE METABOLIC PANEL
ALBUMIN: 3.7 g/dL (ref 3.5–5.0)
ALT: 32 U/L (ref 14–54)
AST: 52 U/L — AB (ref 15–41)
Alkaline Phosphatase: 73 U/L (ref 38–126)
Anion gap: 8 (ref 5–15)
BILIRUBIN TOTAL: 1.7 mg/dL — AB (ref 0.3–1.2)
BUN: 9 mg/dL (ref 6–20)
CHLORIDE: 100 mmol/L — AB (ref 101–111)
CO2: 29 mmol/L (ref 22–32)
CREATININE: 0.51 mg/dL (ref 0.44–1.00)
Calcium: 9.1 mg/dL (ref 8.9–10.3)
GFR calc Af Amer: >60 mL/min (ref >60)
GFR calc non Af Amer: >60 mL/min (ref >60)
GLUCOSE: 133 mg/dL — AB (ref 65–99)
POTASSIUM: 3.9 mmol/L (ref 3.5–5.1)
Sodium: 137 mmol/L (ref 135–145)
TOTAL PROTEIN: 9 g/dL — AB (ref 6.5–8.1)

## 2015-06-11 LAB — URINALYSIS COMPLETE WITH MICROSCOPIC (ARMC ONLY)
BACTERIA UA: NONE SEEN
Bilirubin Urine: NEGATIVE
GLUCOSE, UA: NEGATIVE mg/dL
Hgb urine dipstick: NEGATIVE
Leukocytes, UA: NEGATIVE
Nitrite: NEGATIVE
Protein, ur: NEGATIVE mg/dL
Specific Gravity, Urine: 1.012 (ref 1.005–1.030)
pH: 7 (ref 5.0–8.0)

## 2015-06-11 LAB — LIPASE, BLOOD: Lipase: 20 U/L (ref 11–51)

## 2015-06-11 MED ORDER — ONDANSETRON 8 MG PO TBDP: 8.0000 mg | ORAL_TABLET | Freq: Three times a day (TID) | ORAL | Status: DC | PRN

## 2015-06-11 MED ORDER — SODIUM CHLORIDE 0.9 % IV BOLUS (SEPSIS)
1000.0000 mL | Freq: Once | INTRAVENOUS | Status: AC
  Administered 2015-06-11: 1000 mL via INTRAVENOUS

## 2015-06-11 MED ORDER — RANITIDINE HCL 150 MG PO CAPS: 150.0000 mg | ORAL_CAPSULE | Freq: Two times a day (BID) | ORAL | Status: DC

## 2015-06-11 MED ORDER — FAMOTIDINE 20 MG PO TABS
40.0000 mg | ORAL_TABLET | Freq: Once | ORAL | Status: AC
  Administered 2015-06-11: 40 mg via ORAL
  Filled 2015-06-11: qty 2

## 2015-06-11 MED ORDER — GI COCKTAIL ~~LOC~~
30.0000 mL | ORAL | Status: AC
  Administered 2015-06-11: 30 mL via ORAL
  Filled 2015-06-11: qty 30

## 2015-06-11 NOTE — ED Provider Notes (Signed)
[facility] Emergency Department Provider Note  ____________________________________________  Time seen: 9:30 AM  I have reviewed the triage vital signs and the nursing notes.   HISTORY  Chief Complaint Nausea    HPI KEYLANI ILYAS is a 60 y.o. female who complains of lightheadedness that started 2 days ago along with some nausea and occasional vomiting after eating. She has some pain in the right flank but no dysuria frequency urgency. Denies abdominal pain chest pain or shortness of breath. No fever. No change in bowel habits. Has had problems with acid reflux in the past but does not take any medicine for it.     History reviewed. No pertinent past medical history. Acid reflux  There are no active problems to display for this patient.    History reviewed. No pertinent past surgical history.   Current Outpatient Rx  Name  Route  Sig  Dispense  Refill  . ondansetron (ZOFRAN ODT) 8 MG disintegrating tablet   Oral   Take 1 tablet (8 mg total) by mouth every 8 (eight) hours as needed for nausea or vomiting.   20 tablet   0   . ranitidine (ZANTAC) 150 MG capsule   Oral   Take 1 capsule (150 mg total) by mouth 2 (two) times daily.   28 capsule   0      Allergies Review of patient's allergies indicates no known allergies.   No family history on file.  Social History Social History  Substance Use Topics  . Smoking status: Former Research scientist (life sciences)  . Smokeless tobacco: None  . Alcohol Use: Yes     Comment: States she drinks a little and sometimes a lot.    Review of Systems  Constitutional:   No fever or chills. No weight changes Eyes:   No blurry vision or double vision.  ENT:   No sore throat. Cardiovascular:   No chest pain. Respiratory:   No dyspnea or cough. Gastrointestinal:  Right flank pain and vomiting.Marland Kitchen  No BRBPR or melena. Genitourinary:   Negative for dysuria, urinary retention, bloody urine, or difficulty  urinating. Musculoskeletal:   Negative for back pain. No joint swelling or pain. Skin:   Negative for rash. Neurological:   Negative for headaches, focal weakness or numbness. Psychiatric:  No anxiety or depression.   Endocrine:  No hot/cold intolerance, changes in energy, or sleep difficulty.  10-point ROS otherwise negative.  ____________________________________________   PHYSICAL EXAM:  VITAL SIGNS: ED Triage Vitals  Enc Vitals Group     BP 06/11/15 0827 148/75 mmHg     Pulse Rate 06/11/15 0827 79     Resp 06/11/15 0827 20     Temp 06/11/15 0827 98.1 F (36.7 C)     Temp Source 06/11/15 0827 Oral     SpO2 06/11/15 0827 100 %     Weight 06/11/15 0827 144 lb (65.318 kg)     Height 06/11/15 0827 5\' 5"  (1.651 m)     Head Cir --      Peak Flow --      Pain Score 06/11/15 0829 10     Pain Loc --      Pain Edu? --      Excl. in [place]? --     Vital signs reviewed, nursing assessments reviewed.   Constitutional:   Alert and oriented. Well appearing and in no distress. Eyes:   No scleral icterus. No conjunctival pallor. PERRL. EOMI ENT   Head:   Normocephalic and atraumatic.  Nose:   No congestion/rhinnorhea. No septal hematoma   Mouth/Throat:    Dry mucous membranes , no pharyngeal erythema. No peritonsillar mass. No uvula shift.   Neck:   No stridor. No SubQ emphysema. No meningismus. Hematological/Lymphatic/Immunilogical:   No cervical lymphadenopathy. Cardiovascular:   RRR. Normal and symmetric distal pulses are present in all extremities. No murmurs, rubs, or gallops. Respiratory:   Normal respiratory effort without tachypnea nor retractions. Breath sounds are clear and equal bilaterally. No wheezes/rales/rhonchi. Gastrointestinal:   SofWith mild left upper quadrant tenderness.o distention. There is no CVA tenderness.  No rebound, rigidity, or guarding. Genitourinary:   deferred Musculoskeletal:   Nontender with normal range of motion in all extremities. No  joint effusions.  No lower extremity tenderness.  No edema. Neurologic:   Normal speech and language.  CN 2-10 normal. Motor grossly intact. No pronator drift.  Normal gait. No gross focal neurologic deficits are appreciated.  Skin:    Skin is warm, dry and intact. No rash noted.  No petechiae, purpura, or bullae. Psychiatric:   Mood and affect are normal. Speech and behavior are normal. Patient exhibits appropriate insight and judgment.  ____________________________________________    LABS (pertinent positives/negatives) (all labs ordered are listed, but only abnormal results are displayed) Labs Reviewed  COMPREHENSIVE METABOLIC PANEL - Abnormal; Notable for the following:    Chloride 100 (*)    Glucose, Bld 133 (*)    Total Protein 9.0 (*)    AST 52 (*)    Total Bilirubin 1.7 (*)    All other components within normal limits  CBC WITH DIFFERENTIAL/PLATELET - Abnormal; Notable for the following:    RDW 15.7 (*)    Lymphs Abs 0.6 (*)    All other components within normal limits  URINALYSIS COMPLETEWITH MICROSCOPIC ([facility] ONLY) - Abnormal; Notable for the following:    Color, Urine YELLOW (*)    APPearance CLEAR (*)    Ketones, ur 1+ (*)    Squamous Epithelial / LPF 0-5 (*)    All other components within normal limits  LIPASE, BLOOD   ____________________________________________   EKGInterpreted by me  Date: 06/11/2015  Rate: 81  Rhythm: normal sinus rhythm  QRS Axis: normal  Intervals: normal  ST/T Wave abnormalities: normal  Conduction Disutrbances: none  Narrative Interpretation: unremarkable      ____________________________________________    RADIOLOGY    ____________________________________________   PROCEDURES   ____________________________________________   INITIAL IMPRESSION / ASSESSMENT AND PLAN / ED COURSE  Pertinent labs & imaging results that were available during my care of the patient were reviewed by me and considered in my medical  decision making (see chart for details).   Patient presents with upper abdominal pain and some dizziness on standing. Appears to be dehydrated clinically. Exam also suggestive of gastritis. Low suspicion for pancreatitis AAA obstruction perforation or biliary pathology. We'll discharge home with antiemetics and antacids and follow up with primary care.      _______________   FINAL CLINICAL IMPRESSION(S) / ED DIAGNOSES  Final diagnoses:  Left upper quadrant pain  Mild dehydration      Carrie Mew, MD 06/11/15 1309

## 2015-06-11 NOTE — ED Notes (Signed)
To room 19 via w/c.

## 2015-06-11 NOTE — ED Notes (Signed)
Patient complaining of dizziness and headache starting yesterday.  "Drainage running down her throat".   Complaining nausea, some vomiting "a little bit".  Pain right flank area. All symptoms began "about the same time" but getting "worser and worser".

## 2015-06-11 NOTE — Discharge Instructions (Signed)
Abdominal Pain, Adult Many things can cause abdominal pain. Usually, abdominal pain is not caused by a disease and will improve without treatment. It can often be observed and treated at home. Your health care provider will do a physical exam and possibly order blood tests and X-rays to help determine the seriousness of your pain. However, in many cases, more time must pass before a clear cause of the pain can be found. Before that point, your health care provider may not know if you need more testing or further treatment. HOME CARE INSTRUCTIONS Monitor your abdominal pain for any changes. The following actions may help to alleviate any discomfort you are experiencing:  Only take over-the-counter or prescription medicines as directed by your health care provider.  Do not take laxatives unless directed to do so by your health care provider.  Try a clear liquid diet (broth, tea, or water) as directed by your health care provider. Slowly move to a bland diet as tolerated. SEEK MEDICAL CARE IF:  You have unexplained abdominal pain.  You have abdominal pain associated with nausea or diarrhea.  You have pain when you urinate or have a bowel movement.  You experience abdominal pain that wakes you in the night.  You have abdominal pain that is worsened or improved by eating food.  You have abdominal pain that is worsened with eating fatty foods.  You have a fever. SEEK IMMEDIATE MEDICAL CARE IF:  Your pain does not go away within 2 hours.  You keep throwing up (vomiting).  Your pain is felt only in portions of the abdomen, such as the right side or the left lower portion of the abdomen.  You pass bloody or black tarry stools. MAKE SURE YOU:  Understand these instructions.  Will watch your condition.  Will get help right away if you are not doing well or get worse.   This information is not intended to replace advice given to you by your health care provider. Make sure you discuss  any questions you have with your health care provider.   Document Released: 02/23/2005 Document Revised: 02/04/2015 Document Reviewed: 01/23/2013 Elsevier Interactive Patient Education 2016 Elsevier Inc.  Dehydration, Adult Dehydration is a condition in which you do not have enough fluid or water in your body. It happens when you take in less fluid than you lose. Vital organs such as the kidneys, brain, and heart cannot function without a proper amount of fluids. Any loss of fluids from the body can cause dehydration.  Dehydration can range from mild to severe. This condition should be treated right away to help prevent it from becoming severe. CAUSES  This condition may be caused by:  Vomiting.  Diarrhea.  Excessive sweating, such as when exercising in hot or humid weather.  Not drinking enough fluid during strenuous exercise or during an illness.  Excessive urine output.  Fever.  Certain medicines. RISK FACTORS This condition is more likely to develop in:  People who are taking certain medicines that cause the body to lose excess fluid (diuretics).   People who have a chronic illness, such as diabetes, that may increase urination.  Older adults.   People who live at high altitudes.   People who participate in endurance sports.  SYMPTOMS  Mild Dehydration  Thirst.  Dry lips.  Slightly dry mouth.  Dry, warm skin. Moderate Dehydration  Very dry mouth.   Muscle cramps.   Dark urine and decreased urine production.   Decreased tear production.   Headache.  Light-headedness, especially when you stand up from a sitting position.  Severe Dehydration  Changes in skin.   Cold and clammy skin.   Skin does not spring back quickly when lightly pinched and released.   Changes in body fluids.   Extreme thirst.   No tears.   Not able to sweat when body temperature is high, such as in hot weather.   Minimal urine production.   Changes  in vital signs.   Rapid, weak pulse (more than 100 beats per minute when you are sitting still).   Rapid breathing.   Low blood pressure.   Other changes.   Sunken eyes.   Cold hands and feet.   Confusion.  Lethargy and difficulty being awakened.  Fainting (syncope).   Short-term weight loss.   Unconsciousness. DIAGNOSIS  This condition may be diagnosed based on your symptoms. You may also have tests to determine how severe your dehydration is. These tests may include:   Urine tests.   Blood tests.  TREATMENT  Treatment for this condition depends on the severity. Mild or moderate dehydration can often be treated at home. Treatment should be started right away. Do not wait until dehydration becomes severe. Severe dehydration needs to be treated at the hospital. Treatment for Mild Dehydration  Drinking plenty of water to replace the fluid you have lost.   Replacing minerals in your blood (electrolytes) that you may have lost.  Treatment for Moderate Dehydration  Consuming oral rehydration solution (ORS). Treatment for Severe Dehydration  Receiving fluid through an IV tube.   Receiving electrolyte solution through a feeding tube that is passed through your nose and into your stomach (nasogastric tube or NG tube).  Correcting any abnormalities in electrolytes. HOME CARE INSTRUCTIONS   Drink enough fluid to keep your urine clear or pale yellow.   Drink water or fluid slowly by taking small sips. You can also try sucking on ice cubes.  Have food or beverages that contain electrolytes. Examples include bananas and sports drinks.  Take over-the-counter and prescription medicines only as told by your health care provider.   Prepare ORS according to the manufacturer's instructions. Take sips of ORS every 5 minutes until your urine returns to normal.  If you have vomiting or diarrhea, continue to try to drink water, ORS, or both.   If you have  diarrhea, avoid:   Beverages that contain caffeine.   Fruit juice.   Milk.   Carbonated soft drinks.  Do not take salt tablets. This can lead to the condition of having too much sodium in your body (hypernatremia).  SEEK MEDICAL CARE IF:  You cannot eat or drink without vomiting.  You have had moderate diarrhea during a period of more than 24 hours.  You have a fever. SEEK IMMEDIATE MEDICAL CARE IF:   You have extreme thirst.  You have severe diarrhea.  You have not urinated in 6-8 hours, or you have urinated only a small amount of very dark urine.  You have shriveled skin.  You are dizzy, confused, or both.   This information is not intended to replace advice given to you by your health care provider. Make sure you discuss any questions you have with your health care provider.   Document Released: 05/16/2005 Document Revised: 02/04/2015 Document Reviewed: 10/01/2014 Elsevier Interactive Patient Education [employer].

## 2015-09-18 ENCOUNTER — Encounter: Payer: Self-pay | Admitting: Urgent Care

## 2015-09-18 ENCOUNTER — Emergency Department: Payer: Medicaid Other

## 2015-09-18 ENCOUNTER — Emergency Department
Admission: EM | Admit: 2015-09-18 | Discharge: 2015-09-18 | Disposition: A | Discharge status: Home / Self Care | Payer: Medicaid Other | Attending: Emergency Medicine | Admitting: Emergency Medicine

## 2015-09-18 DIAGNOSIS — Y939 Activity, unspecified: Secondary | ICD-10-CM | POA: Insufficient documentation

## 2015-09-18 DIAGNOSIS — S0083XA Contusion of other part of head, initial encounter: Secondary | ICD-10-CM | POA: Insufficient documentation

## 2015-09-18 DIAGNOSIS — M1990 Unspecified osteoarthritis, unspecified site: Secondary | ICD-10-CM | POA: Diagnosis not present

## 2015-09-18 DIAGNOSIS — Z87891 Personal history of nicotine dependence: Secondary | ICD-10-CM | POA: Diagnosis not present

## 2015-09-18 DIAGNOSIS — Z789 Other specified health status: Secondary | ICD-10-CM

## 2015-09-18 DIAGNOSIS — E119 Type 2 diabetes mellitus without complications: Secondary | ICD-10-CM | POA: Diagnosis not present

## 2015-09-18 DIAGNOSIS — Y999 Unspecified external cause status: Secondary | ICD-10-CM | POA: Insufficient documentation

## 2015-09-18 DIAGNOSIS — I10 Essential (primary) hypertension: Secondary | ICD-10-CM | POA: Insufficient documentation

## 2015-09-18 DIAGNOSIS — Y929 Unspecified place or not applicable: Secondary | ICD-10-CM | POA: Diagnosis not present

## 2015-09-18 DIAGNOSIS — Z79899 Other long term (current) drug therapy: Secondary | ICD-10-CM | POA: Diagnosis not present

## 2015-09-18 DIAGNOSIS — F1099 Alcohol use, unspecified with unspecified alcohol-induced disorder: Secondary | ICD-10-CM | POA: Insufficient documentation

## 2015-09-18 DIAGNOSIS — R51 Headache: Secondary | ICD-10-CM | POA: Diagnosis present

## 2015-09-18 DIAGNOSIS — Z7289 Other problems related to lifestyle: Secondary | ICD-10-CM

## 2015-09-18 HISTORY — DX: Essential (primary) hypertension: I10

## 2015-09-18 HISTORY — DX: Gout, unspecified: M10.9

## 2015-09-18 HISTORY — DX: Unspecified osteoarthritis, unspecified site: M19.90

## 2015-09-18 HISTORY — DX: Type 2 diabetes mellitus without complications: E11.9

## 2015-09-18 LAB — GLUCOSE, CAPILLARY: Glucose-Capillary: 88 mg/dL (ref 65–99)
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
## 2015-09-18 NOTE — Discharge Instructions (Signed)
Facial or Scalp Contusion °A facial or scalp contusion is a deep bruise on the face or head. Injuries to the face and head generally cause a lot of swelling, especially around the eyes. Contusions are the result of an injury that caused bleeding under the skin. The contusion may turn blue, purple, or yellow. Minor injuries will give you a painless contusion, but more severe contusions may stay painful and swollen for a few weeks.  °CAUSES  °A facial or scalp contusion is caused by a blunt injury or trauma to the face or head area.  °SIGNS AND SYMPTOMS  °· Swelling of the injured area.   °· Discoloration of the injured area.   °· Tenderness, soreness, or pain in the injured area.   °DIAGNOSIS  °The diagnosis can be made by taking a medical history and doing a physical exam. An X-ray exam, CT scan, or MRI may be needed to determine if there are any associated injuries, such as broken bones (fractures). °TREATMENT  °Often, the best treatment for a facial or scalp contusion is applying cold compresses to the injured area. Over-the-counter medicines may also be recommended for pain control.  °HOME CARE INSTRUCTIONS  °· Only take over-the-counter or prescription medicines as directed by your health care provider.   °· Apply ice to the injured area.   °· Put ice in a plastic bag.   °· Place a towel between your skin and the bag.   °· Leave the ice on for 20 minutes, 2-3 times a day.   °SEEK MEDICAL CARE IF: °· You have bite problems.   °· You have pain with chewing.   °· You are concerned about facial defects. °SEEK IMMEDIATE MEDICAL CARE IF: °· You have severe pain or a headache that is not relieved by medicine.   °· You have unusual sleepiness, confusion, or personality changes.   °· You throw up (vomit).   °· You have a persistent nosebleed.   °· You have double vision or blurred vision.   °· You have fluid drainage from your nose or ear.   °· You have difficulty walking or using your arms or legs.   °MAKE SURE YOU:   °· Understand these instructions. °· Will watch your condition. °· Will get help right away if you are not doing well or get worse. °  °This information is not intended to replace advice given to you by your health care provider. Make sure you discuss any questions you have with your health care provider. °  °Document Released: 06/23/2004 Document Revised: 06/06/2014 Document Reviewed: 12/27/2012 °Elsevier Interactive Patient Education ©2016 Elsevier Inc. ° °Head Injury, Adult °You have a head injury. Headaches and throwing up (vomiting) are common after a head injury. It should be easy to wake up from sleeping. Sometimes you must stay in the hospital. Most problems happen within the first 24 hours. Side effects may occur up to 7-10 days after the injury.  °WHAT ARE THE TYPES OF HEAD INJURIES? °Head injuries can be as minor as a bump. Some head injuries can be more severe. More severe head injuries include: °· A jarring injury to the brain (concussion). °· A bruise of the brain (contusion). This mean there is bleeding in the brain that can cause swelling. °· A cracked skull (skull fracture). °· Bleeding in the brain that collects, clots, and forms a bump (hematoma). °WHEN SHOULD I GET HELP RIGHT AWAY?  °· You are confused or sleepy. °· You cannot be woken up. °· You feel sick to your stomach (nauseous) or keep throwing up (vomiting). °· Your dizziness or   unsteadiness is getting worse. °· You have very bad, lasting headaches that are not helped by medicine. Take medicines only as told by your doctor. °· You cannot use your arms or legs like normal. °· You cannot walk. °· You notice changes in the black spots in the center of the colored part of your eye (pupil). °· You have clear or bloody fluid coming from your nose or ears. °· You have trouble seeing. °During the next 24 hours after the injury, you must stay with someone who can watch you. This person should get help right away (call 911 in the [place] if you start  to shake and are not able to control it (have seizures), you pass out, or you are unable to wake up. °HOW CAN I PREVENT A HEAD INJURY IN THE FUTURE? °· Wear seat belts. °· Wear a helmet while bike riding and playing sports like football. °· Stay away from dangerous activities around the house. °WHEN CAN I RETURN TO NORMAL ACTIVITIES AND ATHLETICS? °See your doctor before doing these activities. You should not do normal activities or play contact sports until 1 week after the following symptoms have stopped: °· Headache that does not go away. °· Dizziness. °· Poor attention. °· Confusion. °· Memory problems. °· Sickness to your stomach or throwing up. °· Tiredness. °· Fussiness. °· Bothered by bright lights or loud noises. °· Anxiousness or depression. °· Restless sleep. °MAKE SURE YOU:  °· Understand these instructions. °· Will watch your condition. °· Will get help right away if you are not doing well or get worse. °  °This information is not intended to replace advice given to you by your health care provider. Make sure you discuss any questions you have with your health care provider. °  °Document Released: 04/28/2008 Document Revised: 06/06/2014 Document Reviewed: 01/21/2013 °Elsevier Interactive Patient Education ©2016 Elsevier Inc. ° °
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
## 2015-10-23 ENCOUNTER — Emergency Department: Payer: Medicaid Other

## 2015-10-23 ENCOUNTER — Encounter: Payer: Self-pay | Admitting: Emergency Medicine

## 2015-10-23 DIAGNOSIS — I10 Essential (primary) hypertension: Secondary | ICD-10-CM | POA: Diagnosis not present

## 2015-10-23 DIAGNOSIS — R079 Chest pain, unspecified: Secondary | ICD-10-CM | POA: Diagnosis not present

## 2015-10-23 DIAGNOSIS — R1084 Generalized abdominal pain: Secondary | ICD-10-CM | POA: Diagnosis not present

## 2015-10-23 DIAGNOSIS — F17200 Nicotine dependence, unspecified, uncomplicated: Secondary | ICD-10-CM | POA: Insufficient documentation

## 2015-10-23 DIAGNOSIS — M1990 Unspecified osteoarthritis, unspecified site: Secondary | ICD-10-CM | POA: Insufficient documentation

## 2015-10-23 DIAGNOSIS — R112 Nausea with vomiting, unspecified: Secondary | ICD-10-CM | POA: Insufficient documentation

## 2015-10-23 DIAGNOSIS — R42 Dizziness and giddiness: Secondary | ICD-10-CM | POA: Diagnosis present

## 2015-10-23 DIAGNOSIS — E119 Type 2 diabetes mellitus without complications: Secondary | ICD-10-CM | POA: Insufficient documentation

## 2015-10-23 LAB — BASIC METABOLIC PANEL
ANION GAP: 10 (ref 5–15)
BUN: 7 mg/dL (ref 6–20)
CALCIUM: 9 mg/dL (ref 8.9–10.3)
CHLORIDE: 97 mmol/L — AB (ref 101–111)
CO2: 26 mmol/L (ref 22–32)
CREATININE: 0.53 mg/dL (ref 0.44–1.00)
GFR calc non Af Amer: >60 mL/min (ref >60)
GLUCOSE: 141 mg/dL — AB (ref 65–99)
Potassium: 3.5 mmol/L (ref 3.5–5.1)
Sodium: 133 mmol/L — ABNORMAL LOW (ref 135–145)

## 2015-10-23 LAB — CBC
HCT: 37 % (ref 35.0–47.0)
HEMOGLOBIN: 12.4 g/dL (ref 12.0–16.0)
MCH: 30.8 pg (ref 26.0–34.0)
MCHC: 33.6 g/dL (ref 32.0–36.0)
MCV: 91.5 fL (ref 80.0–100.0)
Platelets: 185 10*3/uL (ref 150–440)
RBC: 4.04 MIL/uL (ref 3.80–5.20)
RDW: 16.1 % — ABNORMAL HIGH (ref 11.5–14.5)
WBC: 4.5 10*3/uL (ref 3.6–11.0)

## 2015-10-23 LAB — TROPONIN I: Troponin I: 0.05 ng/mL — ABNORMAL HIGH (ref <0.031)

## 2015-10-23 IMAGING — CR DG CHEST 2V
1 series · 2 of 2 positions shown · non-contrast
Comparison: [DATE]

CLINICAL DATA: Chest. Chest pain. Recent fall x2. Diabetes.
Hypertension.

EXAM:
CHEST  2 VIEW

[Series 1: dg chest 2 view · 0.14mm/px · 2 of 2 slices shown]
[im 1/2]
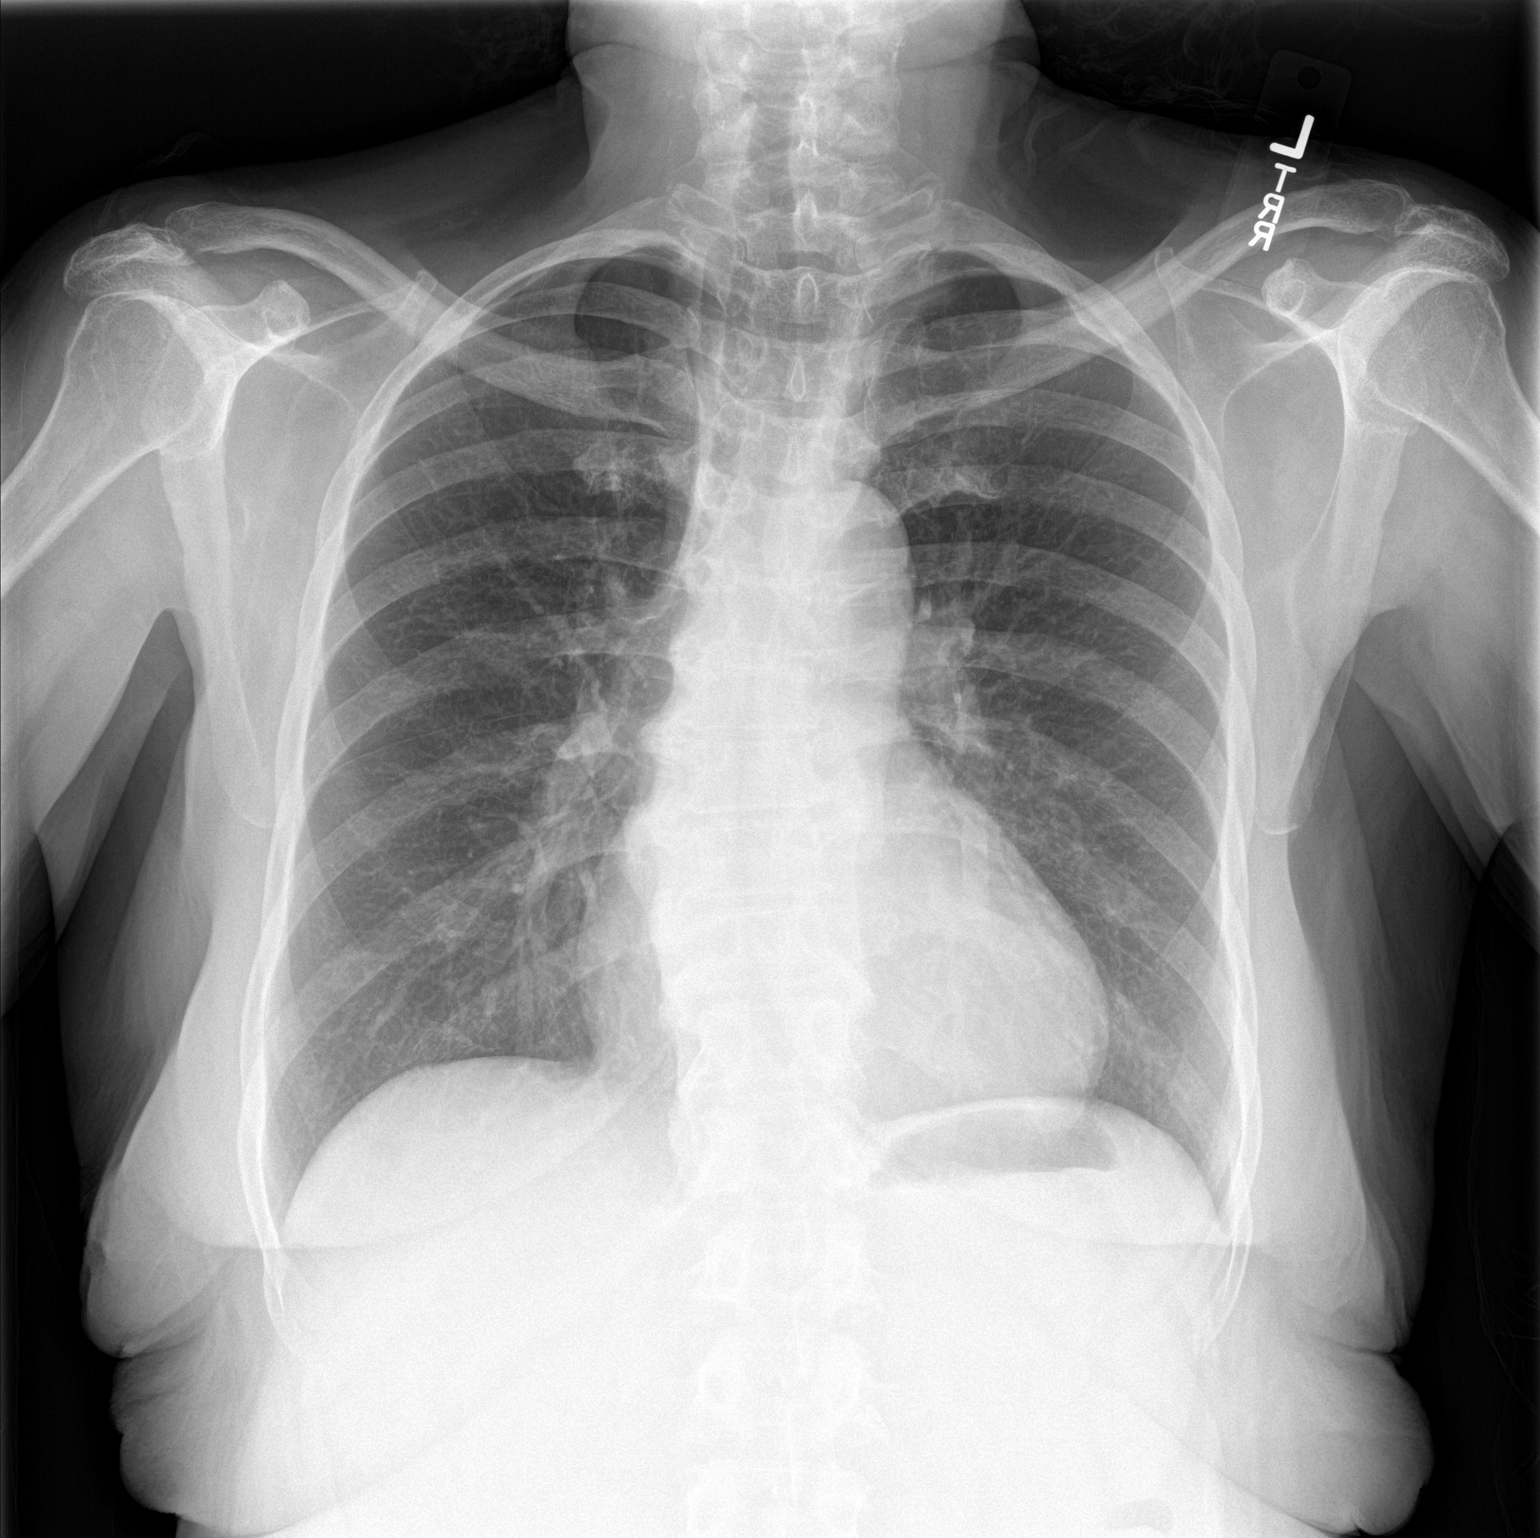
[im 2/2]
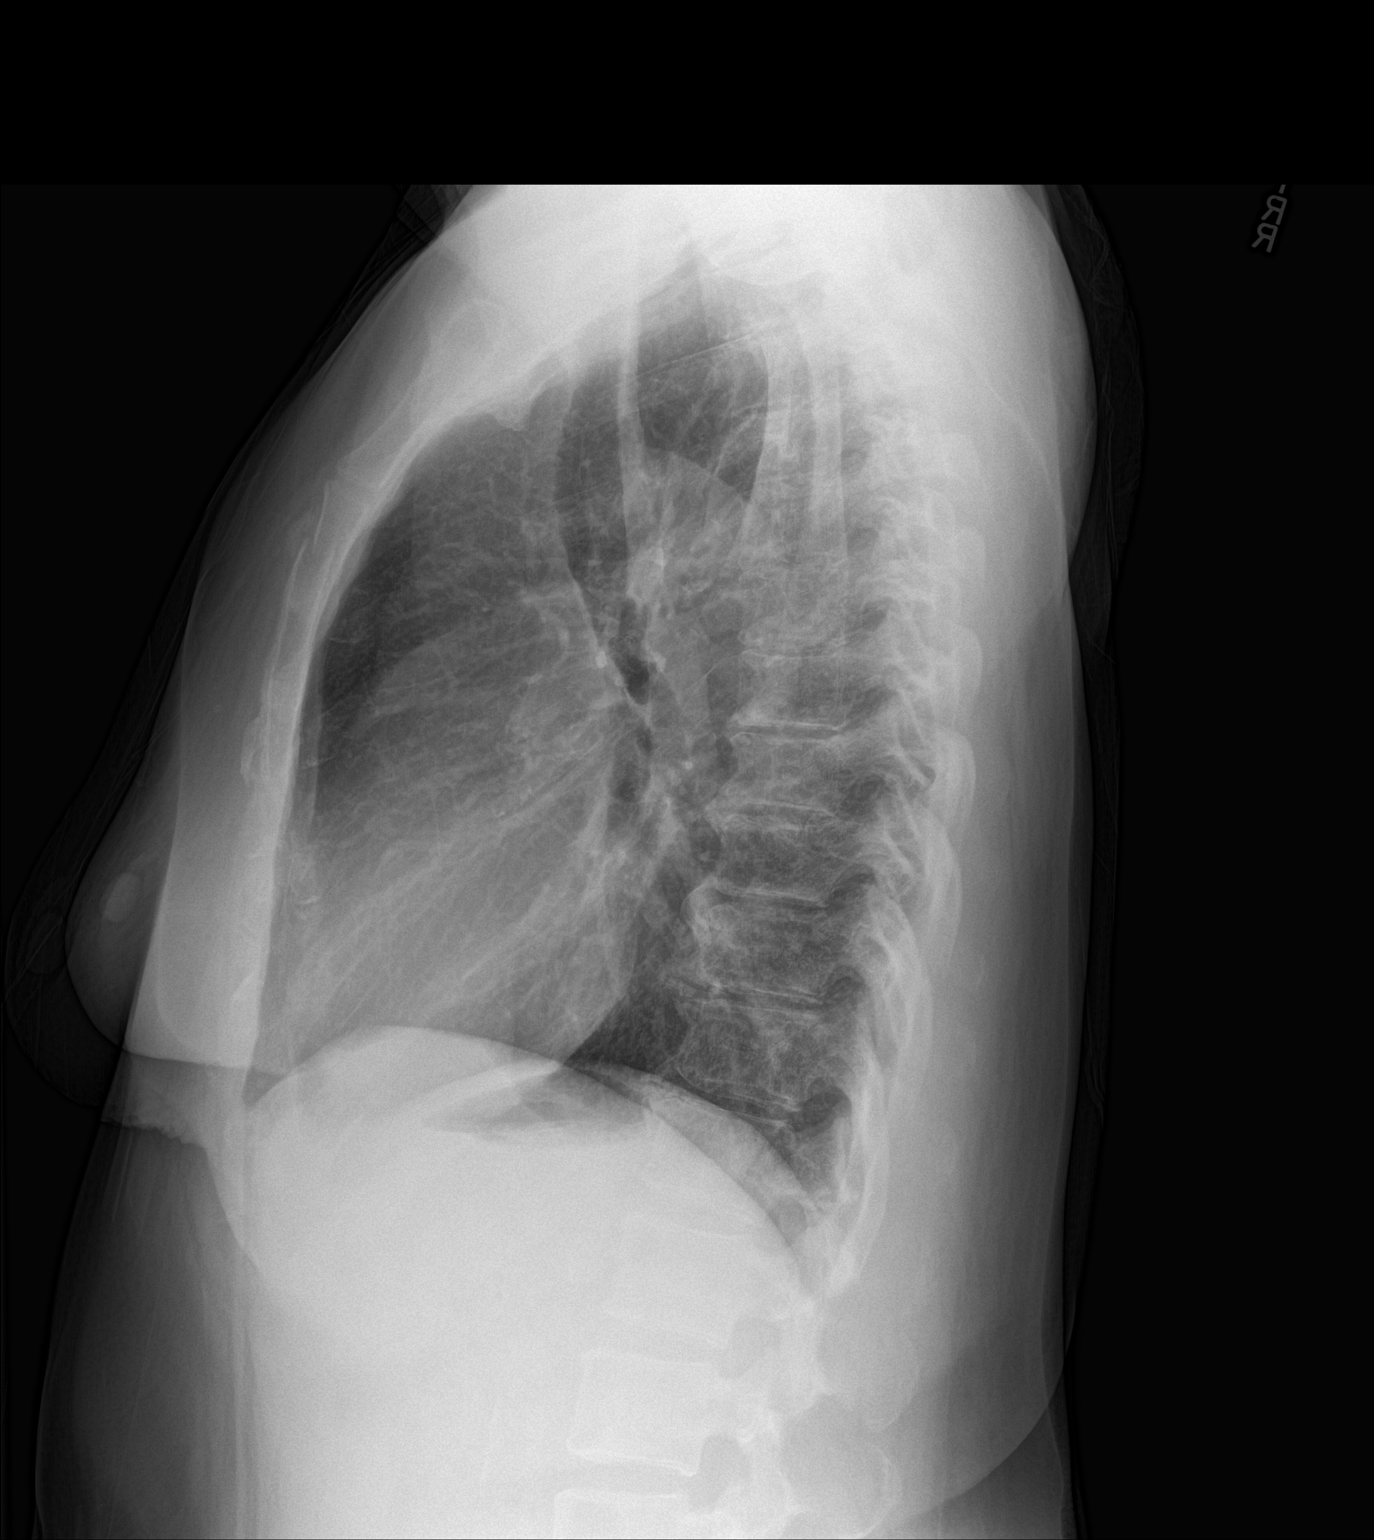

[2 of 2 positions shown; findings below may reference images not displayed]

FINDINGS: Moderate thoracic spondylosis. Mild S-shaped thoracolumbar spine
curvature. Midline trachea. Borderline cardiomegaly. Mediastinal
contours otherwise within normal limits. No pleural effusion or
pneumothorax. Clear lungs.
IMPRESSION: No acute cardiopulmonary disease.

## 2015-10-23 NOTE — ED Notes (Signed)
Dr. Dahlia Client notified of critical lab value - Troponin 0.05.
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
## 2015-10-24 ENCOUNTER — Emergency Department: Payer: Medicaid Other

## 2015-10-24 ENCOUNTER — Encounter: Payer: Self-pay | Admitting: Radiology

## 2015-10-24 ENCOUNTER — Emergency Department
Admission: EM | Admit: 2015-10-24 | Discharge: 2015-10-24 | Disposition: A | Discharge status: Home / Self Care | Payer: Medicaid Other | Attending: Emergency Medicine | Admitting: Emergency Medicine

## 2015-10-24 DIAGNOSIS — R079 Chest pain, unspecified: Secondary | ICD-10-CM

## 2015-10-24 DIAGNOSIS — R112 Nausea with vomiting, unspecified: Secondary | ICD-10-CM

## 2015-10-24 DIAGNOSIS — R1084 Generalized abdominal pain: Secondary | ICD-10-CM

## 2015-10-24 DIAGNOSIS — R1110 Vomiting, unspecified: Secondary | ICD-10-CM

## 2015-10-24 LAB — HEPATIC FUNCTION PANEL
ALK PHOS: 64 U/L (ref 38–126)
ALT: 19 U/L (ref 14–54)
AST: 36 U/L (ref 15–41)
Albumin: 3.5 g/dL (ref 3.5–5.0)
BILIRUBIN DIRECT: 0.3 mg/dL (ref 0.1–0.5)
BILIRUBIN INDIRECT: 1.2 mg/dL — AB (ref 0.3–0.9)
BILIRUBIN TOTAL: 1.5 mg/dL — AB (ref 0.3–1.2)
Total Protein: 8.5 g/dL — ABNORMAL HIGH (ref 6.5–8.1)

## 2015-10-24 LAB — TROPONIN I: Troponin I: 0.03 ng/mL (ref <0.031)

## 2015-10-24 LAB — LIPASE, BLOOD: Lipase: 26 U/L (ref 11–51)

## 2015-10-24 LAB — LACTIC ACID, PLASMA: Lactic Acid, Venous: 1.3 mmol/L (ref 0.5–2.0)

## 2015-10-24 IMAGING — CT CT HEAD W/O CM
2 series · 16 of 30 positions shown, 18 images · non-contrast
Comparison: Head CT [DATE]

CLINICAL DATA: Dizziness and vomiting. Two falls over the last
week.

EXAM:
CT HEAD WITHOUT CONTRAST
TECHNIQUE: Contiguous axial images were obtained from the base of the skull
through the vertex without intravenous contrast.

[Series 2: head wo · axial · 0.39mm/px · z∈[-139,-35]mm · 8 of 30 slices shown, 10 images]
[im 4/30  brain]
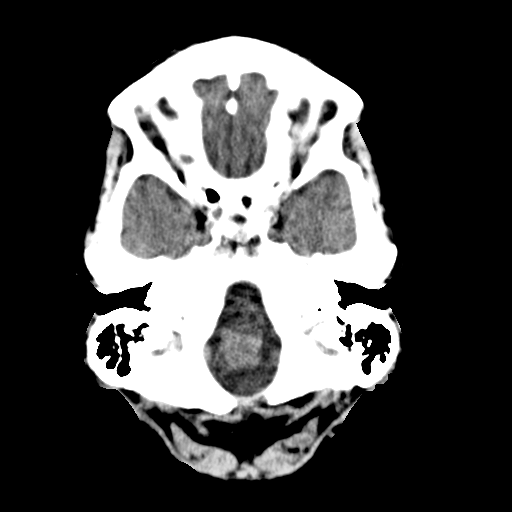
[im 4/30  bone]
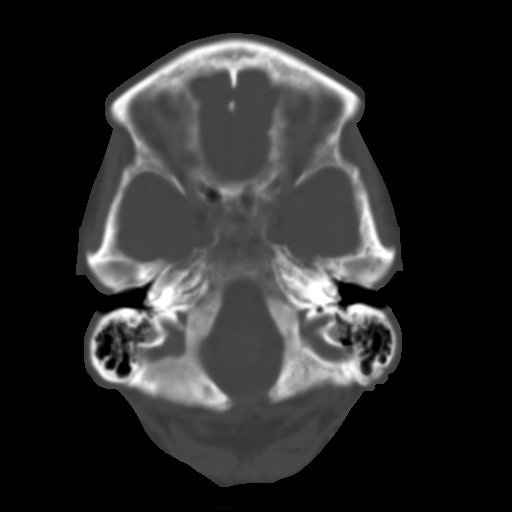
[im 7/30  brain]
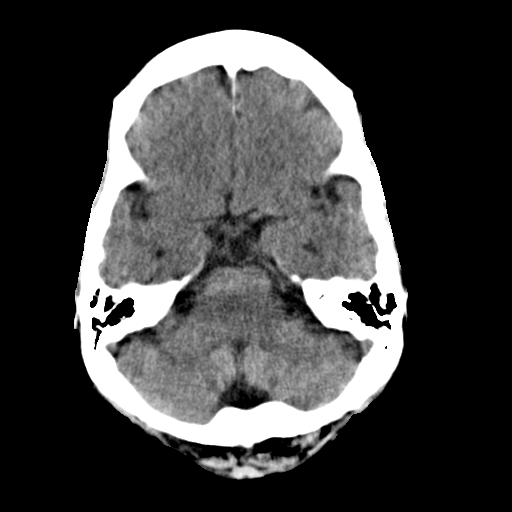
[im 10/30  brain]
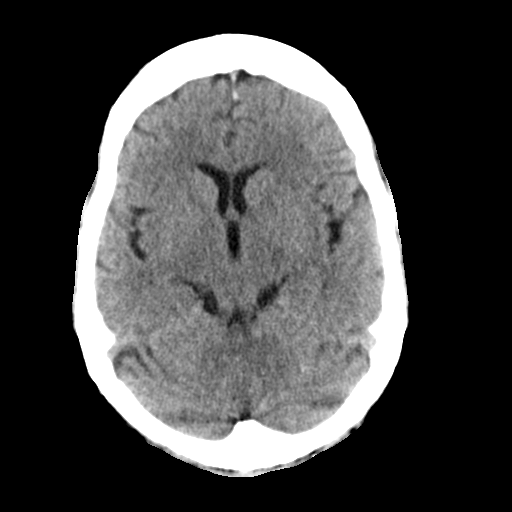
[im 13/30  brain]
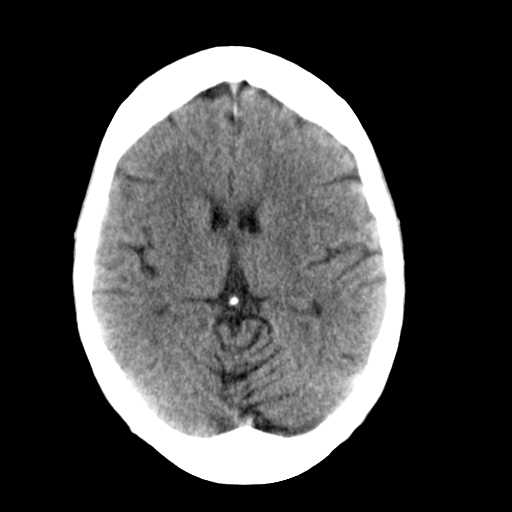
[im 17/30  brain]
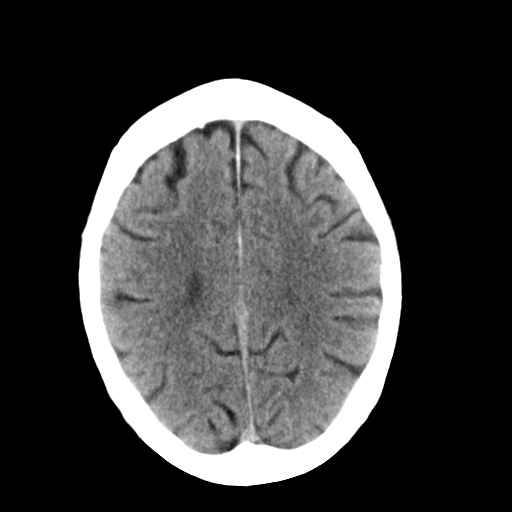
[im 17/30  bone]
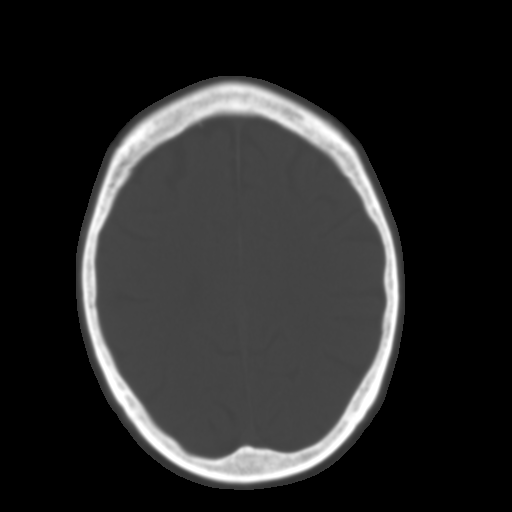
[im 20/30  brain]
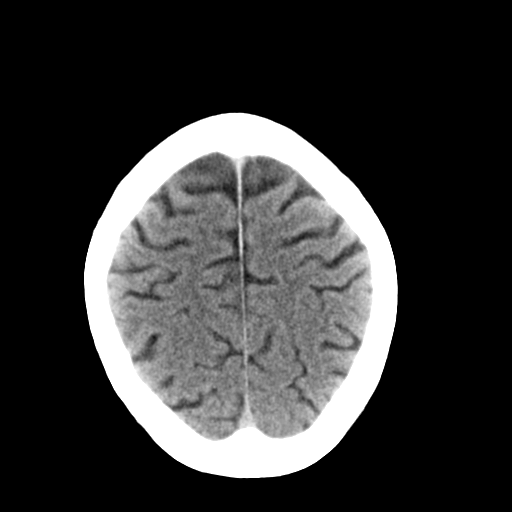
[im 23/30  brain]
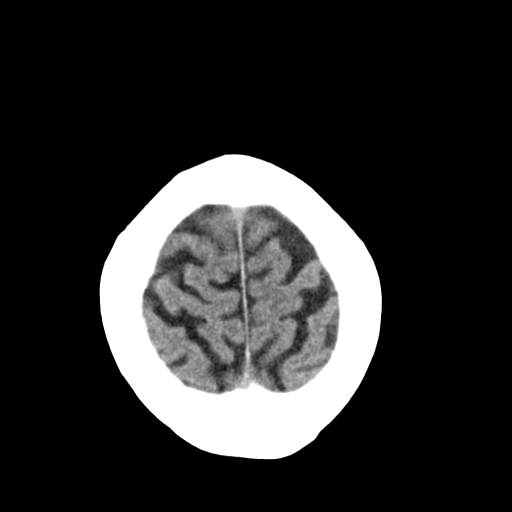
[im 26/30  brain]
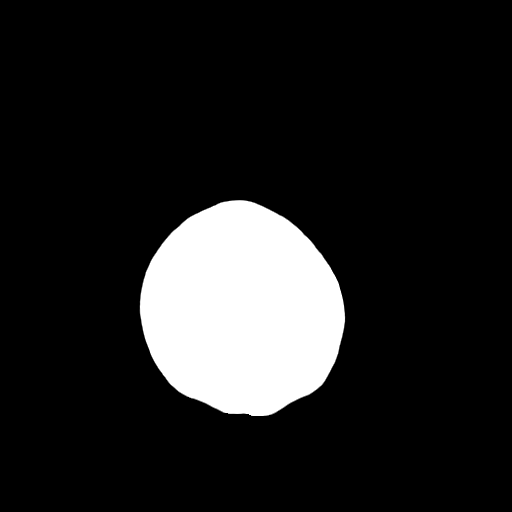

[Series 4: head bone -- · axial · 0.39mm/px · z∈[-140,-31]mm · 8 of 60 slices shown]
[im 7/60  bone]
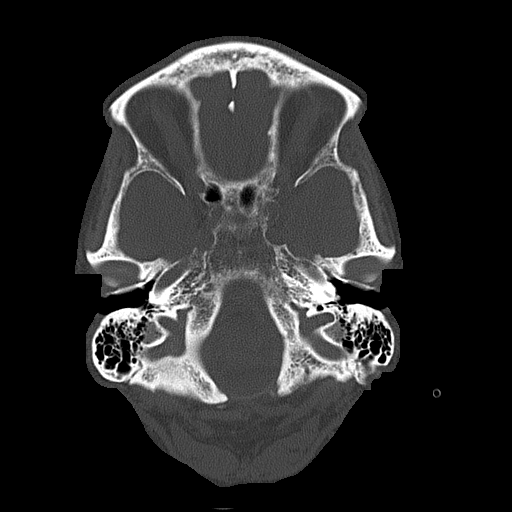
[im 13/60  bone]
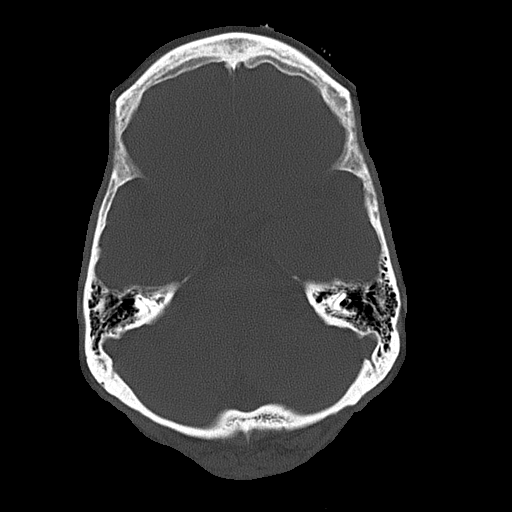
[im 19/60  bone]
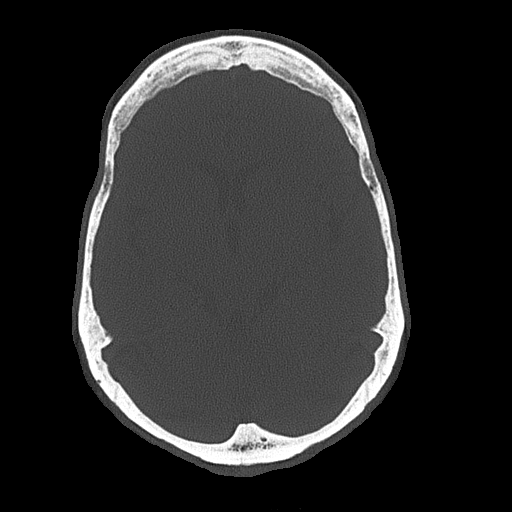
[im 25/60  bone]
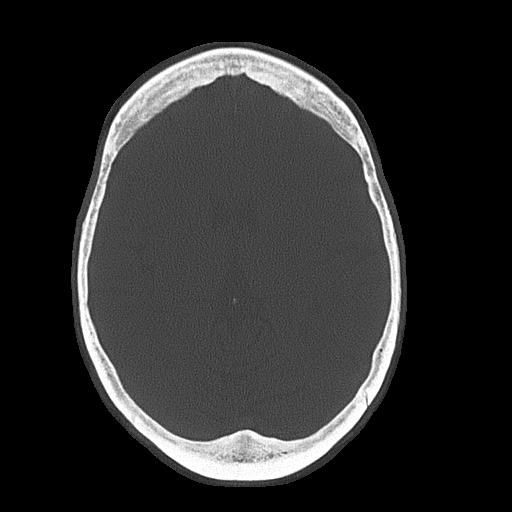
[im 35/60  bone]
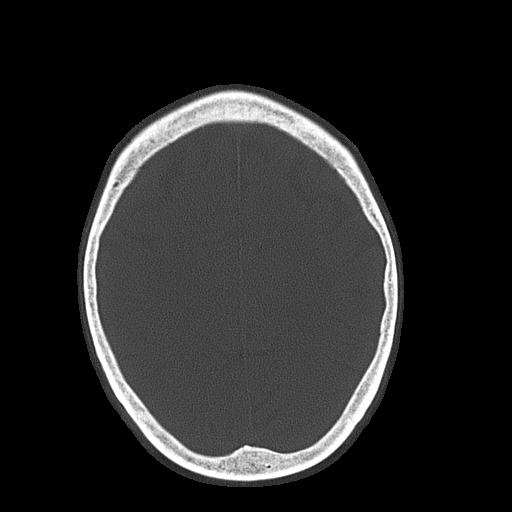
[im 41/60  bone]
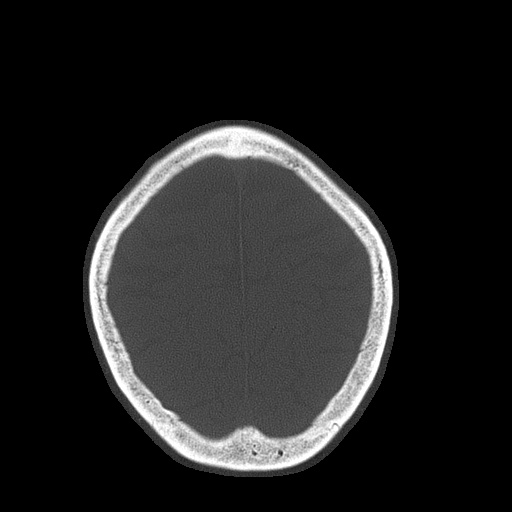
[im 47/60  bone]
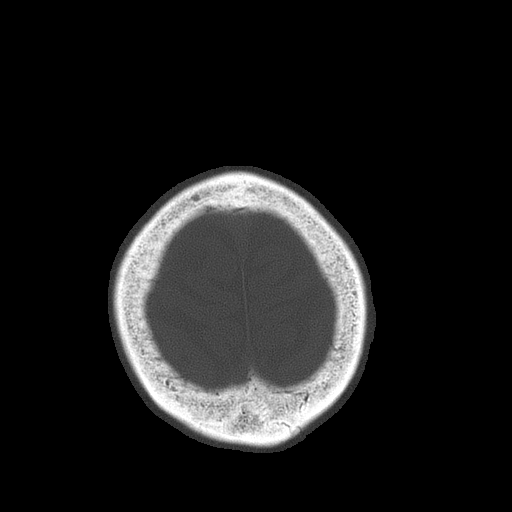
[im 53/60  bone]
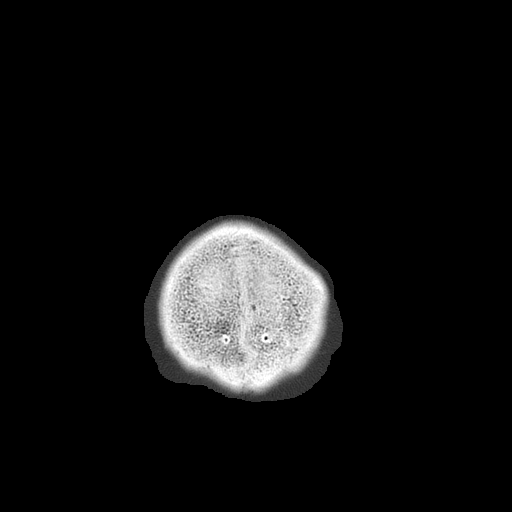

[16 of 30 positions shown; findings below may reference images not displayed]

FINDINGS: No intracranial hemorrhage, mass effect, or midline shift. No
hydrocephalus. Mild cerebellar atrophy. The basilar cisterns are
patent. No evidence of territorial infarct. No intracranial fluid
collection. Calvarium is intact. Included paranasal sinuses and
mastoid air cells are well aerated.
IMPRESSION: No acute intracranial abnormality.

## 2015-10-24 IMAGING — CT CT ABD-PELV W/ CM
2 of 5 series · 15 of 46 positions shown, 17 images · IV contrast (iopamidol)
Comparison: [DATE] and [DATE]

CLINICAL DATA: Abdominal and pelvic pain with nausea and vomiting
starting yesterday. Multiple falls.

EXAM:
CT ABDOMEN AND PELVIS WITH CONTRAST
TECHNIQUE: Multidetector CT imaging of the abdomen and pelvis was performed
using the standard protocol following bolus administration of
intravenous contrast.
CONTRAST:  100mL [63] IOPAMIDOL ([63]) INJECTION 61%

[Series 2: routine abd pel with · axial · 0.68mm/px · z∈[+434,+790]mm · 12 of 81 slices shown, 14 images]
[im 5/81  soft-tissue]
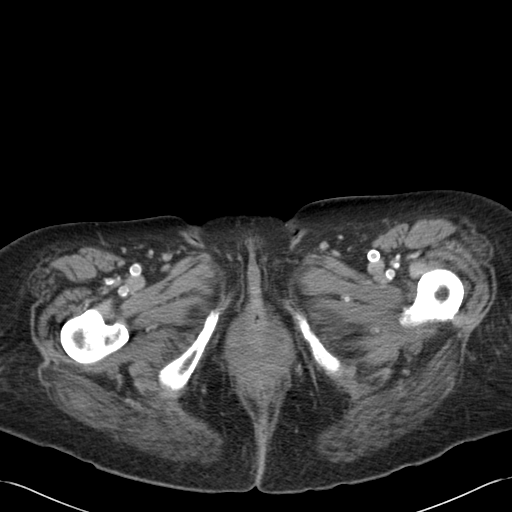
[im 5/81  bone]
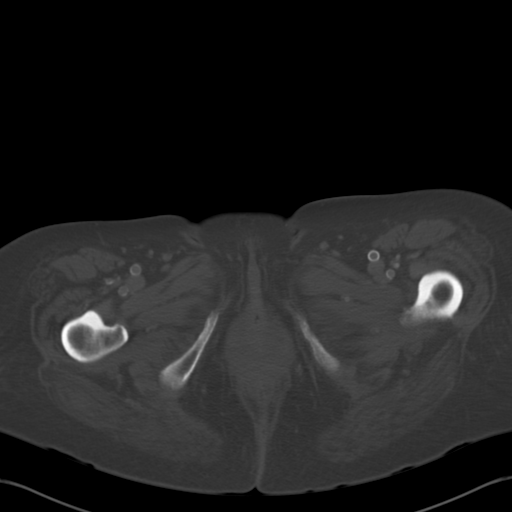
[im 13/81  soft-tissue]
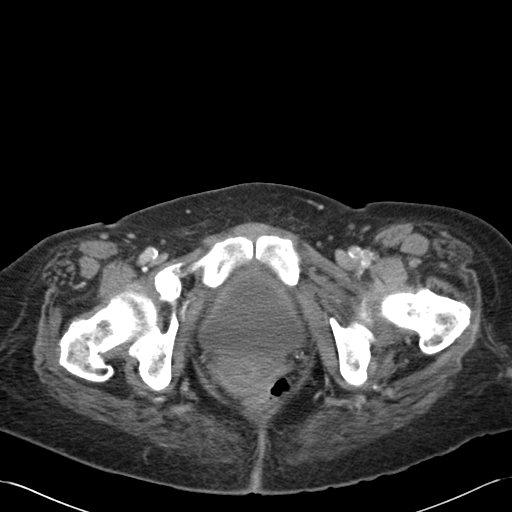
[im 17/81  soft-tissue]
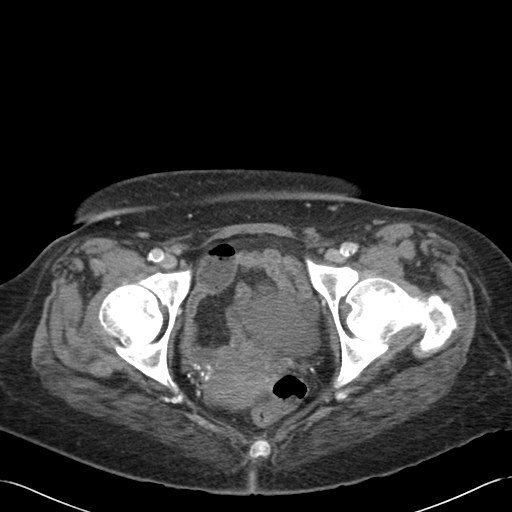
[im 26/81  soft-tissue]
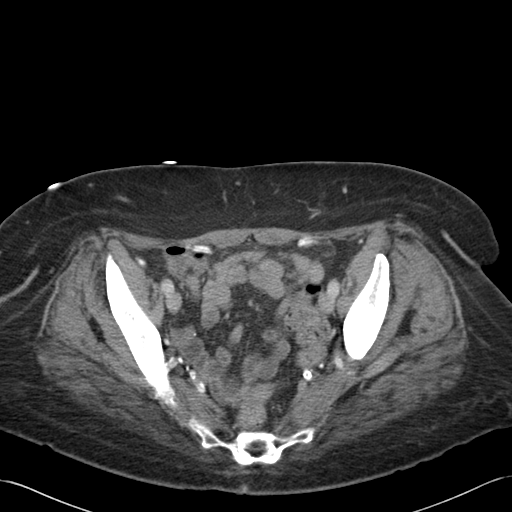
[im 30/81  soft-tissue]
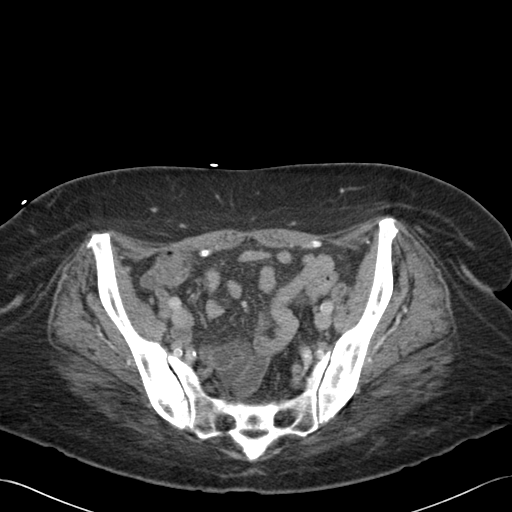
[im 38/81  soft-tissue]
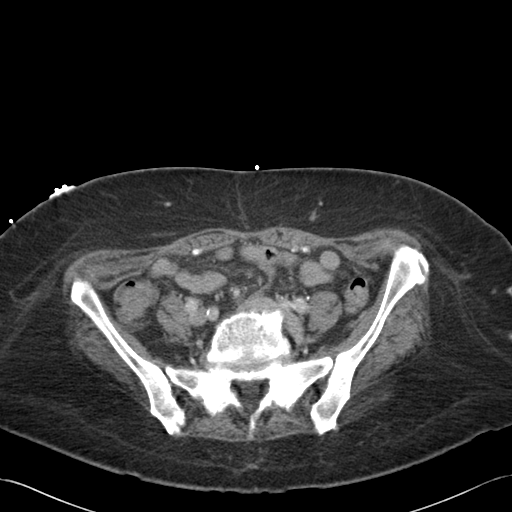
[im 43/81  soft-tissue]
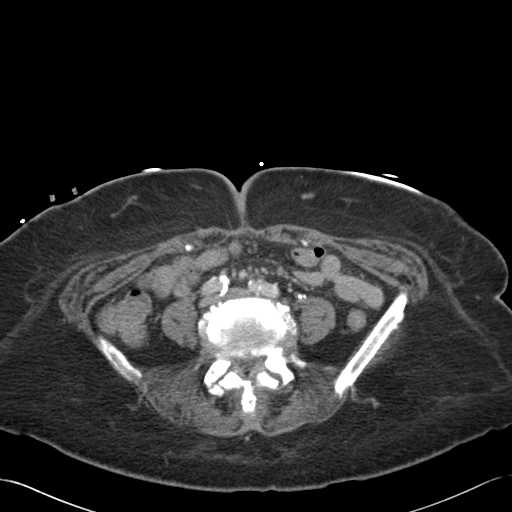
[im 51/81  soft-tissue]
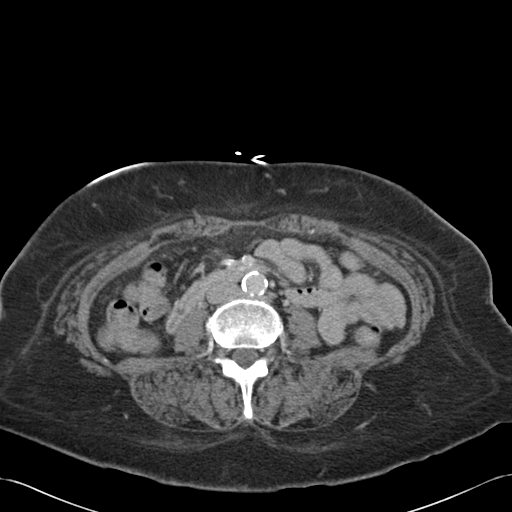
[im 55/81  soft-tissue]
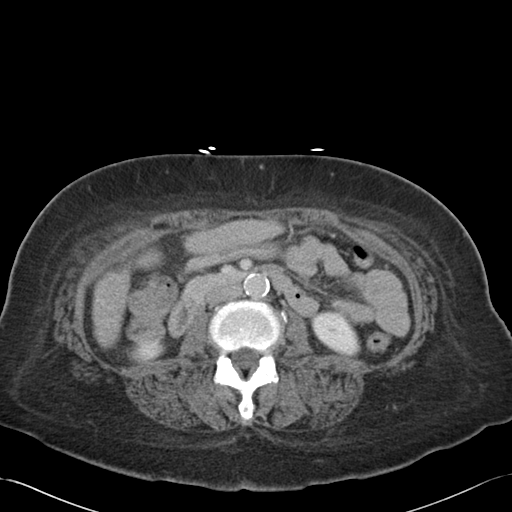
[im 55/81  bone]
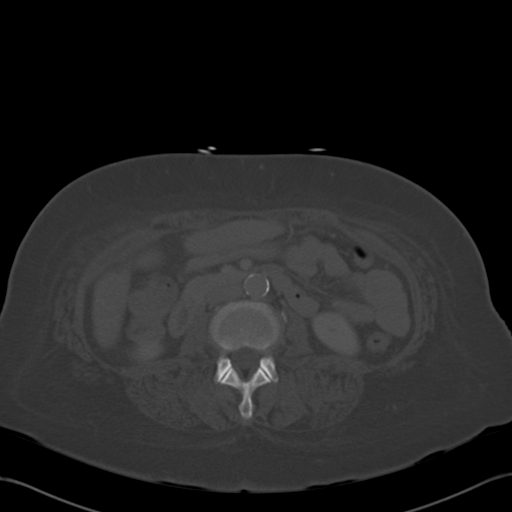
[im 64/81  soft-tissue]
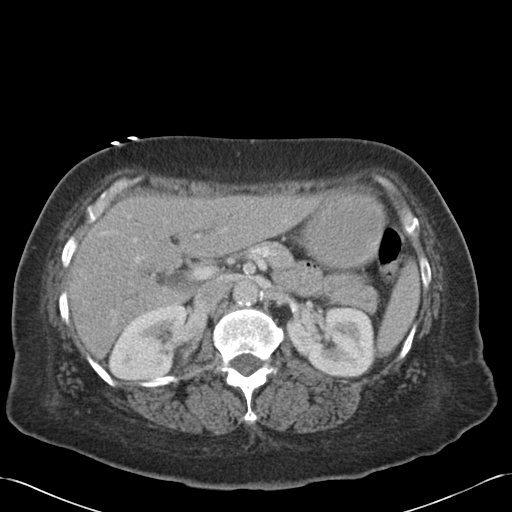
[im 68/81  soft-tissue]
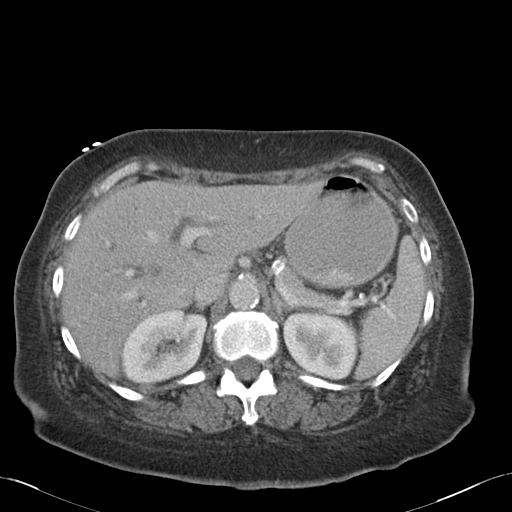
[im 76/81  soft-tissue]
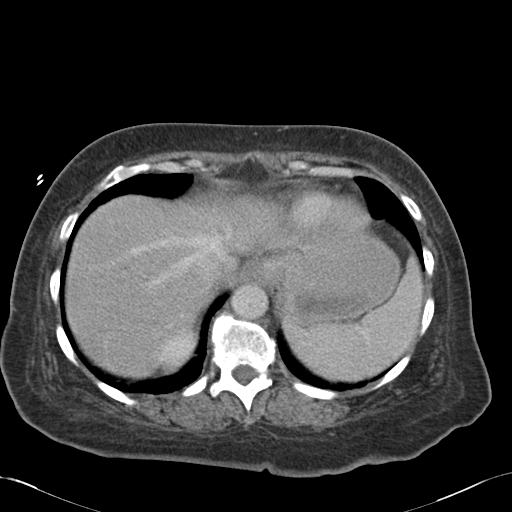

[Series 5: cor routine abd pel with · coronal · 0.65mm/px · 3 of 123 slices shown]
[im 41/123  soft-tissue]
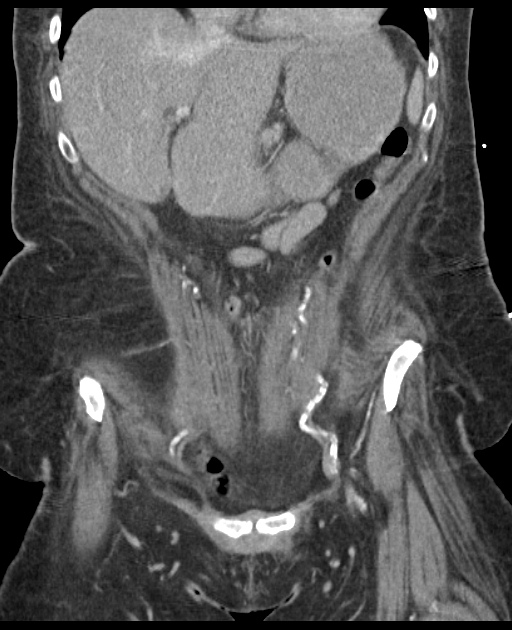
[im 55/123  soft-tissue]
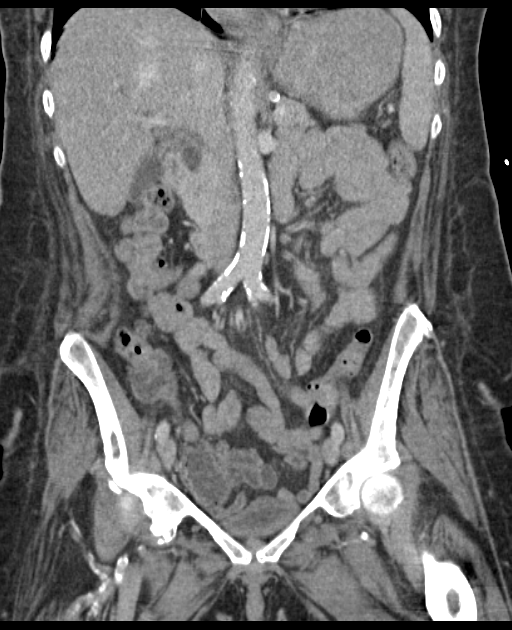
[im 68/123  soft-tissue]
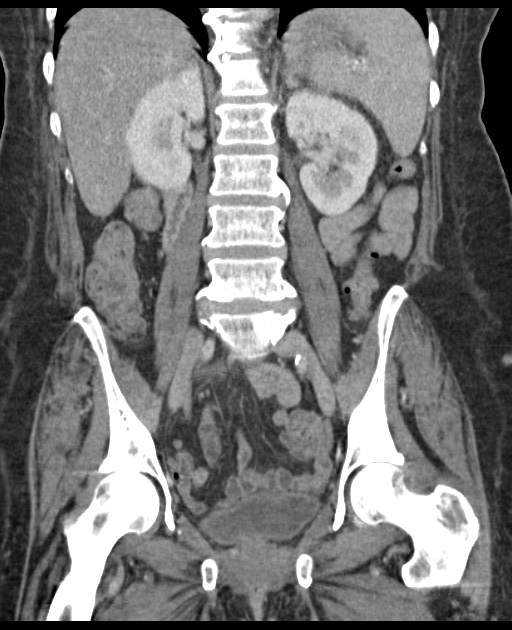

[15 of 46 positions shown; findings below may reference images not displayed]

FINDINGS: Lower chest: Considerable chronic diffuse distal esophageal wall
thickening is present. This could be from chronic inflammation or
tumor. Mild cardiomegaly.

Hepatobiliary: Diffuse hepatic steatosis. Contracted gallbladder.
Mild intrahepatic biliary dilatation. Extrahepatic biliary
dilatation with CBD 1 cm. This seems to taper in the vicinity of the
pancreatic head/ ampulla.

Pancreas: Unremarkable

Spleen: Unremarkable

Adrenals/Urinary Tract: Unremarkable

Stomach/Bowel: Questionably accentuated mucosal enhancement in the
terminal ileum. Appendix normal.

Vascular/Lymphatic: Aortoiliac atherosclerotic vascular disease.
Considerable atherosclerotic calcification of the splenic artery and
inferior epigastric arteries. No pathologic adenopathy identified.

Reproductive: Mildly retroverted uterus. Faint calcifications along
the uterine periphery. No adnexal mass identified.

Other: Subtle pain low grade stranding is in the omentum and
mesentery diffusely.

Musculoskeletal: Sclerosis along the iliac sides of the sacroiliac
joints, right greater than left. Lumbar spondylosis and degenerative
disc disease causing impingement at L4-5 and L5-S1.
IMPRESSION: 1. Prominent wall thickening in the distal esophagus. A similar
appearance was present in [63] and accordingly this may be a chronic
condition. Otherwise I would be suspicious for tumor. This could
reflect significant esophagitis. If this has not been previously
worked up with endoscopy, then upper endoscopy should be considered.
2. Abnormal extrahepatic biliary dilatation, CBD 1 cm, mild
intrahepatic biliary dilatation. I do not see definite
choledocholithiasis. Consider sonography and correlation with
bilirubin levels for further workup.
3.  Aortoiliac atherosclerotic vascular disease.
4. Subtle low grade stranding in the omentum and mesentery
diffusely, significance uncertain. No ascites.
5. Sclerosis along the sacroiliac joints, right greater the left,
query chronic sacroiliitis or osteitis condensans ilii.
6. Lumbar spondylosis and degenerative disc disease causing
impingement at L4-5 and L5-S1.

## 2015-10-24 MED ORDER — ASPIRIN 81 MG PO CHEW
324.0000 mg | CHEWABLE_TABLET | Freq: Once | ORAL | Status: AC
  Administered 2015-10-24: 324 mg via ORAL

## 2015-10-24 MED ORDER — SODIUM CHLORIDE 0.9 % IV BOLUS (SEPSIS)
1000.0000 mL | Freq: Once | INTRAVENOUS | Status: AC
  Administered 2015-10-24: 1000 mL via INTRAVENOUS

## 2015-10-24 MED ORDER — ONDANSETRON HCL 4 MG/2ML IJ SOLN
INTRAMUSCULAR | Status: AC
  Administered 2015-10-24: 4 mg
  Filled 2015-10-24: qty 2

## 2015-10-24 MED ORDER — DIATRIZOATE MEGLUMINE & SODIUM 66-10 % PO SOLN
15.0000 mL | Freq: Once | ORAL | Status: AC
  Administered 2015-10-24: 15 mL via ORAL

## 2015-10-24 MED ORDER — METOCLOPRAMIDE HCL 5 MG/ML IJ SOLN
10.0000 mg | Freq: Once | INTRAMUSCULAR | Status: AC
  Administered 2015-10-24: 10 mg via INTRAVENOUS
  Filled 2015-10-24: qty 2

## 2015-10-24 MED ORDER — IOPAMIDOL (ISOVUE-300) INJECTION 61%
100.0000 mL | Freq: Once | INTRAVENOUS | Status: AC | PRN
  Administered 2015-10-24: 100 mL via INTRAVENOUS

## 2015-10-24 MED ORDER — SODIUM CHLORIDE 0.9 % IV BOLUS (SEPSIS)
500.0000 mL | Freq: Once | INTRAVENOUS | Status: AC
  Administered 2015-10-24: 500 mL via INTRAVENOUS

## 2015-10-24 MED ORDER — NITROGLYCERIN 0.4 MG SL SUBL
0.4000 mg | SUBLINGUAL_TABLET | SUBLINGUAL | Status: DC | PRN
  Administered 2015-10-24: 0.4 mg via SUBLINGUAL
  Filled 2015-10-24: qty 3

## 2015-10-24 NOTE — ED Notes (Signed)
Checked in on patient and asked how the ginger ale was sitting and pt stated, "Not great but it hasn't come back up." Asked pt if she was nauseated and pt stated, "Maybe a little but not really."

## 2015-10-24 NOTE — ED Notes (Signed)
Pt given gingerale for PO challenge 

## 2015-10-24 NOTE — Discharge Instructions (Signed)
You have been seen in the emergency department today for chest and abdominal pain. Your workup has shown normal results. As we discussed please follow-up with your primary care physician in the next 1-2 days for recheck. Return to the emergency department for any further chest pain, trouble breathing, or any other symptom personally concerning to yourself. Please call the number provided for cardiology to arrange a stress test as soon as possible.   Abdominal Pain, Adult Many things can cause abdominal pain. Usually, abdominal pain is not caused by a disease and will improve without treatment. It can often be observed and treated at home. Your health care provider will do a physical exam and possibly order blood tests and X-rays to help determine the seriousness of your pain. However, in many cases, more time must pass before a clear cause of the pain can be found. Before that point, your health care provider may not know if you need more testing or further treatment. HOME CARE INSTRUCTIONS Monitor your abdominal pain for any changes. The following actions may help to alleviate any discomfort you are experiencing:  Only take over-the-counter or prescription medicines as directed by your health care provider.  Do not take laxatives unless directed to do so by your health care provider.  Try a clear liquid diet (broth, tea, or water) as directed by your health care provider. Slowly move to a bland diet as tolerated. SEEK MEDICAL CARE IF:  You have unexplained abdominal pain.  You have abdominal pain associated with nausea or diarrhea.  You have pain when you urinate or have a bowel movement.  You experience abdominal pain that wakes you in the night.  You have abdominal pain that is worsened or improved by eating food.  You have abdominal pain that is worsened with eating fatty foods.  You have a fever. SEEK IMMEDIATE MEDICAL CARE IF:  Your pain does not go away within 2 hours.  You  keep throwing up (vomiting).  Your pain is felt only in portions of the abdomen, such as the right side or the left lower portion of the abdomen.  You pass bloody or black tarry stools. MAKE SURE YOU:  Understand these instructions.  Will watch your condition.  Will get help right away if you are not doing well or get worse.   This information is not intended to replace advice given to you by your health care provider. Make sure you discuss any questions you have with your health care provider.   Document Released: 02/23/2005 Document Revised: 02/04/2015 Document Reviewed: 01/23/2013 Elsevier Interactive Patient Education 2016 [employer]  Nonspecific Chest Pain  Chest pain can be caused by many different conditions. There is always a chance that your pain could be related to something serious, such as a heart attack or a blood clot in your lungs. Chest pain can also be caused by conditions that are not life-threatening. If you have chest pain, it is very important to follow up with your health care provider. CAUSES  Chest pain can be caused by:  Heartburn.  Pneumonia or bronchitis.  Anxiety or stress.  Inflammation around your heart (pericarditis) or lung (pleuritis or pleurisy).  A blood clot in your lung.  A collapsed lung (pneumothorax). It can develop suddenly on its own (spontaneous pneumothorax) or from trauma to the chest.  Shingles infection (varicella-zoster virus).  Heart attack.  Damage to the bones, muscles, and cartilage that make up your chest wall. This can include:  Bruised bones due  to injury.  Strained muscles or cartilage due to frequent or repeated coughing or overwork.  Fracture to one or more ribs.  Sore cartilage due to inflammation (costochondritis). RISK FACTORS  Risk factors for chest pain may include:  Activities that increase your risk for trauma or injury to your chest.  Respiratory infections or conditions that cause frequent  coughing.  Medical conditions or overeating that can cause heartburn.  Heart disease or family history of heart disease.  Conditions or health behaviors that increase your risk of developing a blood clot.  Having had chicken pox (varicella zoster). SIGNS AND SYMPTOMS Chest pain can feel like:  Burning or tingling on the surface of your chest or deep in your chest.  Crushing, pressure, aching, or squeezing pain.  Dull or sharp pain that is worse when you move, cough, or take a deep breath.  Pain that is also felt in your back, neck, shoulder, or arm, or pain that spreads to any of these areas. Your chest pain may come and go, or it may stay constant. DIAGNOSIS Lab tests or other studies may be needed to find the cause of your pain. Your health care provider may have you take a test called an ambulatory ECG (electrocardiogram). An ECG records your heartbeat patterns at the time the test is performed. You may also have other tests, such as:  Transthoracic echocardiogram (TTE). During echocardiography, sound waves are used to create a picture of all of the heart structures and to look at how blood flows through your heart.  Transesophageal echocardiogram (TEE).This is a more advanced imaging test that obtains images from inside your body. It allows your health care provider to see your heart in finer detail.  Cardiac monitoring. This allows your health care provider to monitor your heart rate and rhythm in real time.  Holter monitor. This is a portable device that records your heartbeat and can help to diagnose abnormal heartbeats. It allows your health care provider to track your heart activity for several days, if needed.  Stress tests. These can be done through exercise or by taking medicine that makes your heart beat more quickly.  Blood tests.  Imaging tests. TREATMENT  Your treatment depends on what is causing your chest pain. Treatment may include:  Medicines. These may  include:  Acid blockers for heartburn.  Anti-inflammatory medicine.  Pain medicine for inflammatory conditions.  Antibiotic medicine, if an infection is present.  Medicines to dissolve blood clots.  Medicines to treat coronary artery disease.  Supportive care for conditions that do not require medicines. This may include:  Resting.  Applying heat or cold packs to injured areas.  Limiting activities until pain decreases. HOME CARE INSTRUCTIONS  If you were prescribed an antibiotic medicine, finish it all even if you start to feel better.  Avoid any activities that bring on chest pain.  Do not use any tobacco products, including cigarettes, chewing tobacco, or electronic cigarettes. If you need help quitting, ask your health care provider.  Do not drink alcohol.  Take medicines only as directed by your health care provider.  Keep all follow-up visits as directed by your health care provider. This is important. This includes any further testing if your chest pain does not go away.  If heartburn is the cause for your chest pain, you may be told to keep your head raised (elevated) while sleeping. This reduces the chance that acid will go from your stomach into your esophagus.  Make lifestyle changes as directed  by your health care provider. These may include:  Getting regular exercise. Ask your health care provider to suggest some activities that are safe for you.  Eating a heart-healthy diet. A registered dietitian can help you to learn healthy eating options.  Maintaining a healthy weight.  Managing diabetes, if necessary.  Reducing stress. SEEK MEDICAL CARE IF:  Your chest pain does not go away after treatment.  You have a rash with blisters on your chest.  You have a fever. SEEK IMMEDIATE MEDICAL CARE IF:   Your chest pain is worse.  You have an increasing cough, or you cough up blood.  You have severe abdominal pain.  You have severe weakness.  You  faint.  You have chills.  You have sudden, unexplained chest discomfort.  You have sudden, unexplained discomfort in your arms, back, neck, or jaw.  You have shortness of breath at any time.  You suddenly start to sweat, or your skin gets clammy.  You feel nauseous or you vomit.  You suddenly feel light-headed or dizzy.  Your heart begins to beat quickly, or it feels like it is skipping beats. These symptoms may represent a serious problem that is an emergency. Do not wait to see if the symptoms will go away. Get medical help right away. Call your local emergency services (911 in the [place] Do not drive yourself to the hospital.   This information is not intended to replace advice given to you by your health care provider. Make sure you discuss any questions you have with your health care provider.   Document Released: 02/23/2005 Document Revised: 06/06/2014 Document Reviewed: 12/20/2013 Elsevier Interactive Patient Education [employer].

## 2015-10-24 NOTE — ED Notes (Signed)
Patient states that she is having central chest pain, Dr. Dahlia Client informed. Will try one dose of SL nitro for the chest pain.

## 2015-10-24 NOTE — ED Provider Notes (Signed)
[facility] Emergency Department Provider Note   ____________________________________________  Time seen: Approximately 0015 AM  I have reviewed the triage vital signs and the nursing notes.   HISTORY  Chief Complaint Chest Pain; Fall; and Burn    HPI Donna Aguirre is a 60 y.o. female who comes into the hospital today with multiple complaints. The patient reports that her body feels sore and she fell twice she is also feeling nauseous with hot and cold chills. She also thinks she burned her chest. The patient fell last week and then again on Monday. She reports that she was going out the door and she just got up and fell. She reports that she's had some dizziness and occasional feeling hot. She's had some chest pain and she's had some abdominal soreness since the fall. The patient reports that she has pain in her back as well. The patient rates her pain a 10 out of 10 in intensity. She took one Aleve but reports that she spit it up before she swallowed it. She is having some nausea as well as vomiting. The patient has a history of reflux but does not think this is attributed to her reflux. The patient has had some sweats as well. She put a warm cloth on her chest to help with her chest pain and now she feels that her chest burns. The patient is here for evaluation of all these symptoms.The patient does not have worsened pain with deep inspiration but reports that her chest does hurt.   Past Medical History  Diagnosis Date  . Diabetes mellitus without complication ([place])   . Hypertension   . Gout   . Arthritis     There are no active problems to display for this patient.   History reviewed. No pertinent past surgical history.  Current Outpatient Rx  Name  Route  Sig  Dispense  Refill  . ranitidine (ZANTAC) 150 MG capsule   Oral   Take 1 capsule (150 mg total) by mouth 2 (two) times daily. Patient taking differently: Take 150 mg by mouth 2 (two) times  daily as needed for heartburn.    28 capsule   0   . ondansetron (ZOFRAN ODT) 8 MG disintegrating tablet   Oral   Take 1 tablet (8 mg total) by mouth every 8 (eight) hours as needed for nausea or vomiting.   20 tablet   0     Allergies Review of patient's allergies indicates no known allergies.  No family history on file.  Social History Social History  Substance Use Topics  . Smoking status: Current Every Day Smoker  . Smokeless tobacco: None  . Alcohol Use: Yes     Comment: States she drinks a little and sometimes a lot.    Review of Systems Constitutional: No fever/chills Eyes: No visual changes. ENT: No sore throat. Cardiovascular:  chest pain. Respiratory: Denies shortness of breath. Gastrointestinal: abdominal pain.  nausea,  vomiting.  No diarrhea.  No constipation. Genitourinary: Negative for dysuria. Musculoskeletal: Negative for back pain. Skin: Negative for rash. Neurological: Dizziness  10-point ROS otherwise negative.  ____________________________________________   PHYSICAL EXAM:  VITAL SIGNS: ED Triage Vitals  Enc Vitals Group     BP 10/23/15 2258 155/92 mmHg     Pulse Rate 10/23/15 2258 92     Resp 10/23/15 2258 18     Temp 10/23/15 2258 98.2 F (36.8 C)     Temp Source 10/23/15 2258 Oral  SpO2 10/23/15 2258 99 %     Weight 10/23/15 2258 145 lb (65.772 kg)     Height 10/23/15 2258 5\' 4"  (1.626 m)     Head Cir --      Peak Flow --      Pain Score 10/23/15 2259 8     Pain Loc --      Pain Edu? --      Excl. in [place]? --     Constitutional: Alert and oriented. Well appearing and in Moderate distress. Eyes: Conjunctivae are normal. PERRL. EOMI. Head: Atraumatic. Nose: No congestion/rhinnorhea. Mouth/Throat: Mucous membranes are moist.  Oropharynx non-erythematous. Cardiovascular: Normal rate, regular rhythm. Grossly normal heart sounds.  Good peripheral circulation. Respiratory: Normal respiratory effort.  No retractions. Lungs  CTAB. Gastrointestinal: Soft and nontender. No distention. Active bowel sounds with Musculoskeletal: No lower extremity tenderness nor edema.   Neurologic:  Normal speech and language.  Skin:  Skin is warm, dry and intact.  Psychiatric: Mood and affect are normal.   ____________________________________________   LABS (all labs ordered are listed, but only abnormal results are displayed)  Labs Reviewed  BASIC METABOLIC PANEL - Abnormal; Notable for the following:    Sodium 133 (*)    Chloride 97 (*)    Glucose, Bld 141 (*)    All other components within normal limits  CBC - Abnormal; Notable for the following:    RDW 16.1 (*)    All other components within normal limits  TROPONIN I - Abnormal; Notable for the following:    Troponin I 0.05 (*)    All other components within normal limits  LACTIC ACID, PLASMA  TROPONIN I   ____________________________________________  EKG  ED ECG REPORT I, Loney Hering, the attending physician, personally viewed and interpreted this ECG.   Date: 10/24/2015  EKG Time: 2303  Rate: 96  Rhythm: normal sinus rhythm  Axis: Normal  Intervals:none  ST&T Change: Flipped T-wave in leads V4, V5, V6  ____________________________________________  RADIOLOGY  Chest x-ray: No acute cardiopulmonary disease  CT head: No acute intracranial abnormality  CT abdomen and pelvis: Prominent wall thickening in the distal esophagus, a similar appearance was present in 2011 and accordingly this may be chronic condition otherwise I would be suspicious for tumor as could reflect significant esophagitis. Abnormal extrahepatic biliary dilatation CBD 1 cm mild intrahepatic biliary dilatation. Aortoiliac atherosclerotic vascular disease, subtle low-grade stranding in the omentum and mesentery diffusely uncertain significance. Sclerosis along the sacroiliac joints, right greater than left query chronic sacroiliitis or osteitis. Degenerative disc disease causing  impingement at L4/5 and L5/S1 ____________________________________________   PROCEDURES  Procedure(s) performed: None  Critical Care performed: No  ____________________________________________   INITIAL IMPRESSION / ASSESSMENT AND PLAN / ED COURSE  Pertinent labs & imaging results that were available during my care of the patient were reviewed by me and considered in my medical decision making (see chart for details).  This is a 52-year-old female who comes into the hospital today with some chest pain, nausea, vomiting and dizziness. The patient has had 2 episodes of vomiting here in the ED but she has no tenderness to her abdomen. She has no blisters to her chest indicating a burn. The patient does have a mildly elevated troponin. I will give her some aspirin as well as some nitroglycerin as her blood pressure is elevated. I will reassess the patient and repeat her troponin. She will receive a CT of her head and further evaluation.  The patient  did receive some Zofran for nausea and then received some Reglan for more nausea. We did monitor the patient throughout the night and attempted to do a by mouth trial. The patient continued to complain of nausea. Decision was made to do a CT scan of the patient's abdomen as she was still having nausea and some mild discomfort while here. The CT scan is concerning she does have some esophagitis but she does have some dilated common bile duct. We will add a lipase and hepatic function panel onto the patient's blood work and we will send the patient for an ultrasound of her right upper quadrant. The patient does not have adequate follow-up and she is a poor historian. I feel that this continued workup would be beneficial to the patient. The patient's care was signed out to Dr. Harvest Dark who will follow-up the results and disposition the patient. ____________________________________________   FINAL CLINICAL IMPRESSION(S) / ED DIAGNOSES  Final  diagnoses:  Chest pain, unspecified chest pain type  Non-intractable vomiting with nausea, vomiting of unspecified type  Generalized abdominal pain      NEW MEDICATIONS STARTED DURING THIS VISIT:  New Prescriptions   No medications on file     Note:  This document was prepared using Dragon voice recognition software and may include unintentional dictation errors.    Loney Hering, MD 10/24/15 [phone]

## 2015-10-24 NOTE — ED Provider Notes (Signed)
-----------------------------------------   10:57 AM on 10/24/2015 -----------------------------------------  Patient care assumed from Dr. Dahlia Client. CT notes mild dilation on file ducts, right upper quadrant ultrasound was ordered which also shows mild dilation of bile ducts, however the patient has no liver function test abnormalities, and her pain appears to be more epigastrium/chest. Patient sleeping comfortably upon my evaluation, awakens easily to voice, denies any pain currently and I discussed the patient's results and the need to follow-up with her primary care doctor. Patient is agreeable. I will also refer the patient for a cardiologist given her age to obtain a stress test. I discussed strict return precautions, patient has no additional questions, we will discharge home at this time.  Harvest Dark, MD 10/24/15 1058

## 2015-10-24 NOTE — ED Notes (Signed)
While this RN was in room, pt fell asleep and O2 sats dropped to 87%. Pt placed on 2L by [place] and sats up to 97%.

## 2015-10-24 NOTE — ED Notes (Signed)
Patient states that she is unable to drink contrast for CT. Dr. Kerman Passey informed. Per Dr. Kerman Passey ok to scan without contrast. Kat in CT notified.

## 2017-04-22 ENCOUNTER — Encounter: Payer: Self-pay | Admitting: Emergency Medicine

## 2017-04-22 ENCOUNTER — Emergency Department: Payer: Medicaid Other

## 2017-04-22 ENCOUNTER — Emergency Department
Admission: EM | Admit: 2017-04-22 | Discharge: 2017-04-22 | Disposition: A | Discharge status: Home / Self Care | Payer: Medicaid Other | Attending: Emergency Medicine | Admitting: Emergency Medicine

## 2017-04-22 DIAGNOSIS — F17200 Nicotine dependence, unspecified, uncomplicated: Secondary | ICD-10-CM | POA: Insufficient documentation

## 2017-04-22 DIAGNOSIS — R079 Chest pain, unspecified: Secondary | ICD-10-CM

## 2017-04-22 DIAGNOSIS — E119 Type 2 diabetes mellitus without complications: Secondary | ICD-10-CM | POA: Diagnosis not present

## 2017-04-22 DIAGNOSIS — I10 Essential (primary) hypertension: Secondary | ICD-10-CM | POA: Insufficient documentation

## 2017-04-22 LAB — BASIC METABOLIC PANEL
Anion gap: 8 (ref 5–15)
BUN: <5 mg/dL — ABNORMAL LOW (ref 6–20)
CALCIUM: 8.9 mg/dL (ref 8.9–10.3)
CO2: 25 mmol/L (ref 22–32)
CREATININE: 0.65 mg/dL (ref 0.44–1.00)
Chloride: 99 mmol/L — ABNORMAL LOW (ref 101–111)
GFR calc non Af Amer: >60 mL/min (ref >60)
Glucose, Bld: 103 mg/dL — ABNORMAL HIGH (ref 65–99)
Potassium: 3.7 mmol/L (ref 3.5–5.1)
Sodium: 132 mmol/L — ABNORMAL LOW (ref 135–145)

## 2017-04-22 LAB — CBC
HCT: 32.4 % — ABNORMAL LOW (ref 35.0–47.0)
Hemoglobin: 11.3 g/dL — ABNORMAL LOW (ref 12.0–16.0)
MCH: 31.5 pg (ref 26.0–34.0)
MCHC: 34.8 g/dL (ref 32.0–36.0)
MCV: 90.6 fL (ref 80.0–100.0)
PLATELETS: 189 10*3/uL (ref 150–440)
RBC: 3.57 MIL/uL — ABNORMAL LOW (ref 3.80–5.20)
RDW: 15.2 % — ABNORMAL HIGH (ref 11.5–14.5)
WBC: 3.5 10*3/uL — ABNORMAL LOW (ref 3.6–11.0)

## 2017-04-22 LAB — TROPONIN I: Troponin I: <0.03 ng/mL (ref <0.03)

## 2017-04-22 IMAGING — CR DG CHEST 2V
2 series · 2 of 2 positions shown · non-contrast
Comparison: Chest radiograph [DATE]

CLINICAL DATA: Chest pain

EXAM:
CHEST  2 VIEW

[chest lat]
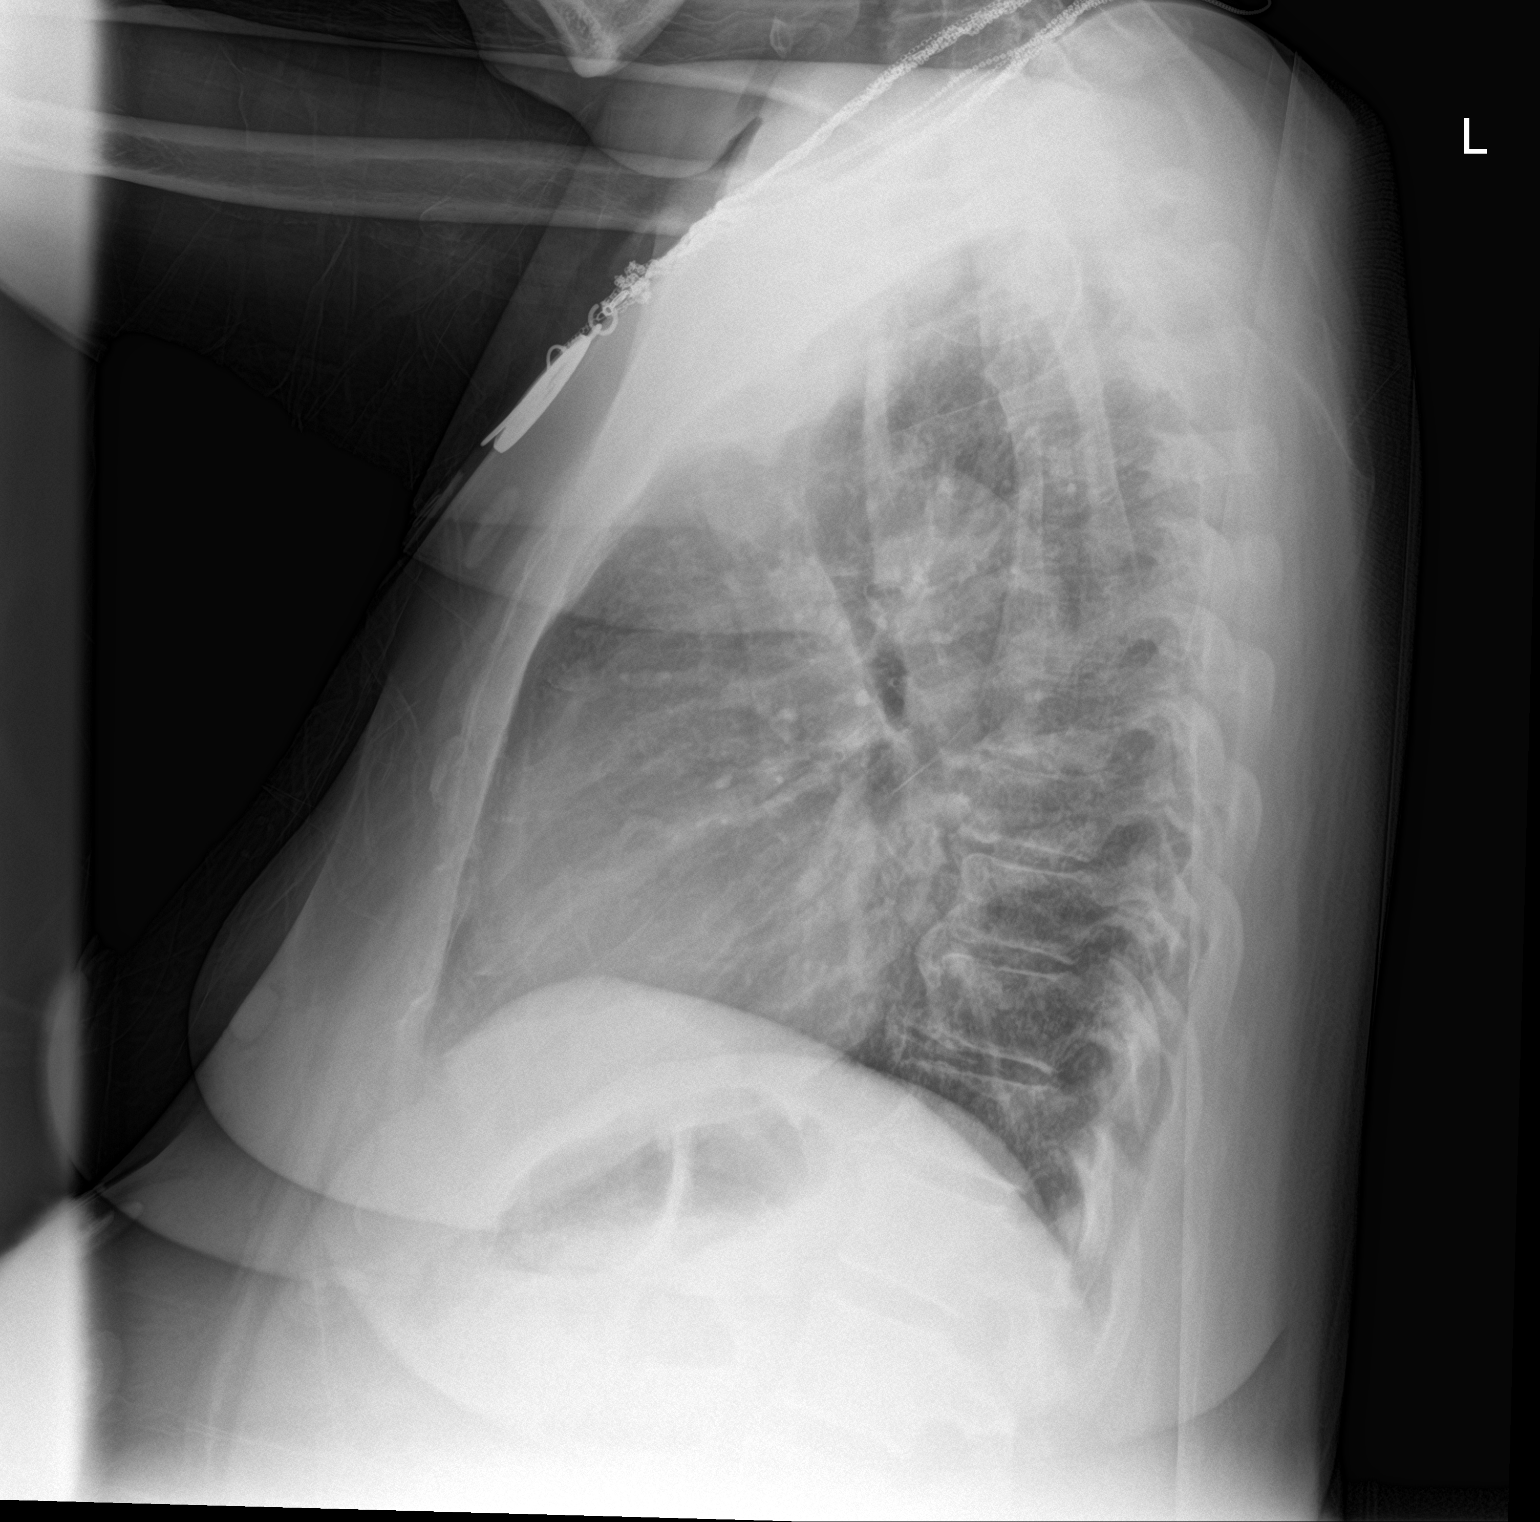

[chest ap]
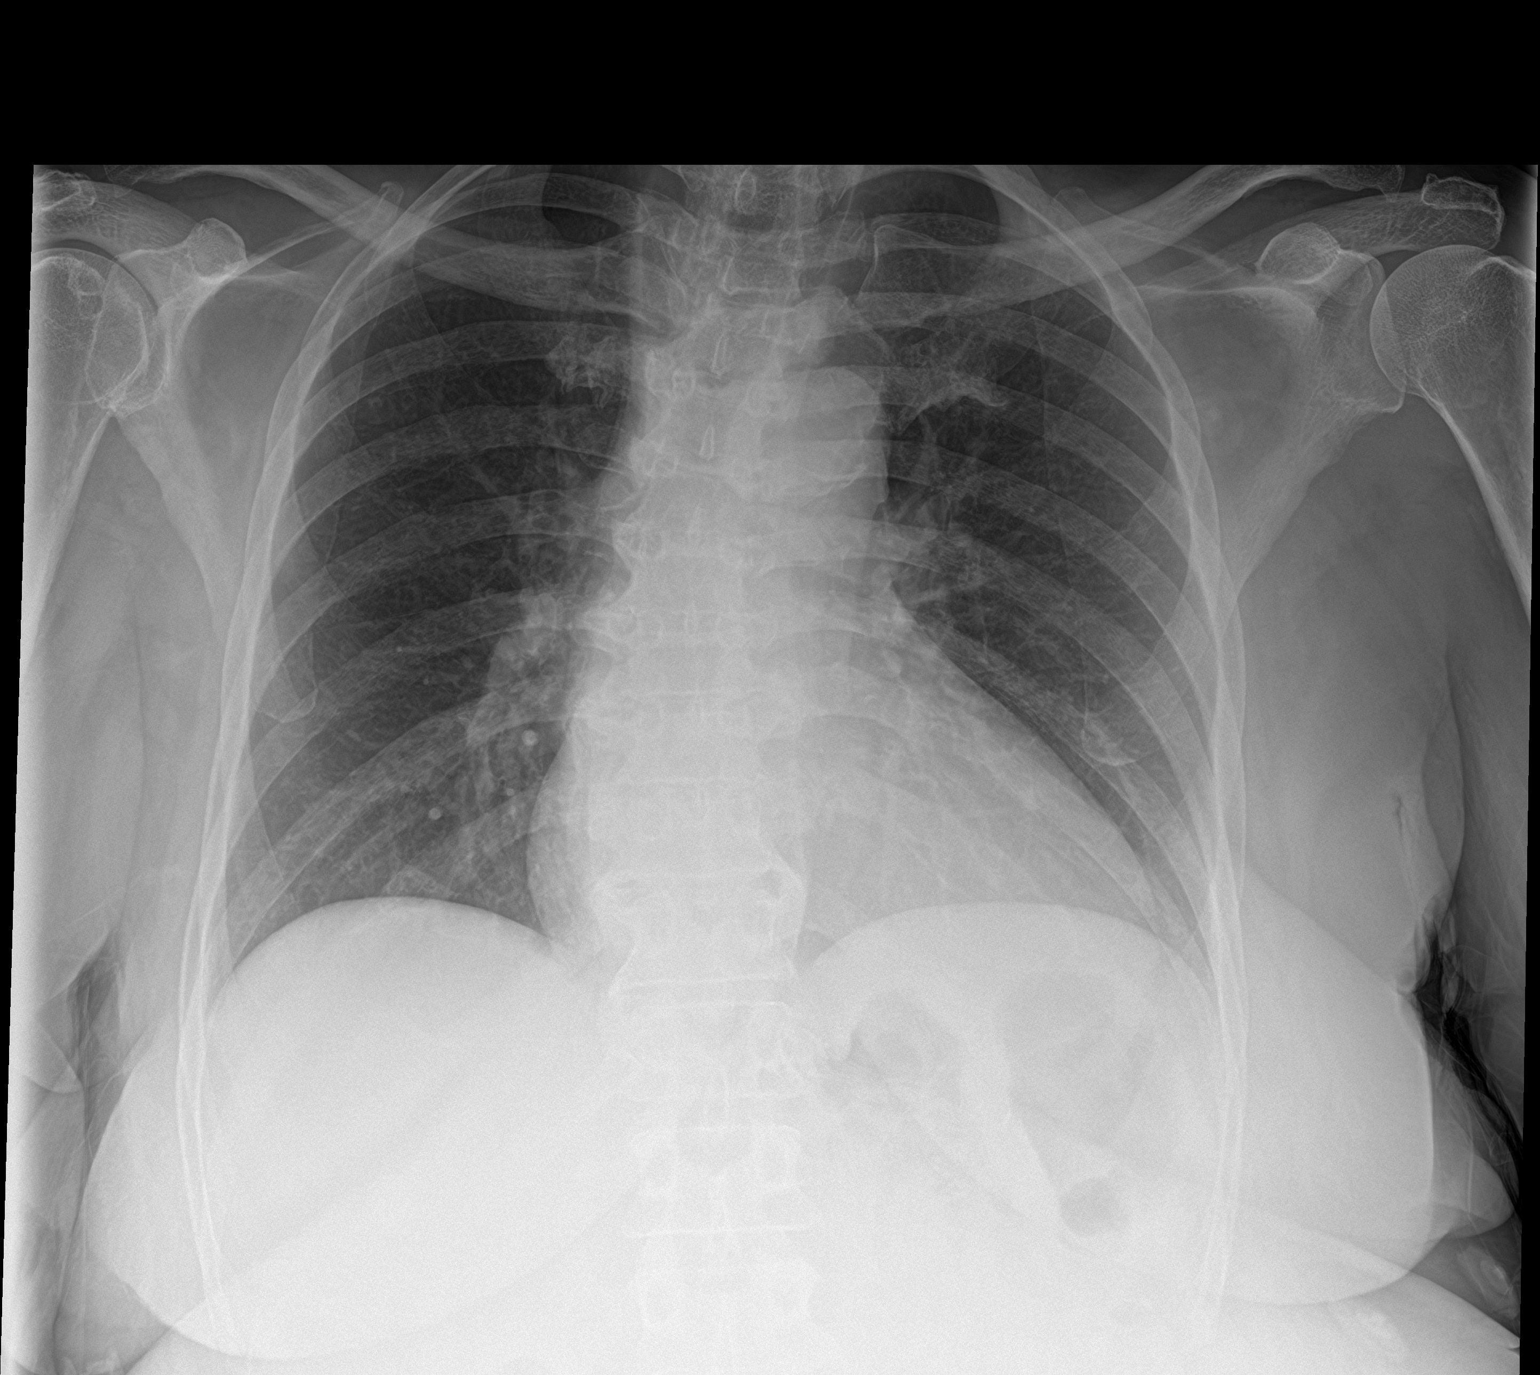

[2 of 2 positions shown; findings below may reference images not displayed]

FINDINGS: The heart size and mediastinal contours are within normal limits.
Both lungs are clear. The visualized skeletal structures are
unremarkable.
IMPRESSION: No active cardiopulmonary disease.

## 2017-04-22 MED ORDER — ASPIRIN 81 MG PO CHEW
324.0000 mg | CHEWABLE_TABLET | Freq: Once | ORAL | Status: AC
  Administered 2017-04-22: 324 mg via ORAL
  Filled 2017-04-22: qty 4

## 2017-04-22 NOTE — ED Notes (Signed)
Patient did sign discharge paper and was witnessed by this RN, unable to save signature to software error.  Patient took dc papers and was dc to lobby to contact family.

## 2017-04-22 NOTE — ED Provider Notes (Signed)
[facility] Emergency Department Provider Note  ____________________________________________  Time seen: Approximately 12:52 AM  I have reviewed the triage vital signs and the nursing notes.   HISTORY  Chief Complaint Chest Pain   HPI Donna Aguirre is a 61 y.o. female with h/o smoking, DM and HTN however not on any meds for years since loosing weight who presents for evaluationof CP. According to patient, she was changing her bed linens ealier this evening when she developed pain in the L side of her chest. Patient reports severe pain that she describes as if someone punched her in the chest that lasted about 1 hour. No pain at this time. According to EMS there was a lot of commotion in the house because patient did not want to come to the ED and family members were very upset and arguing with patient for her to come. Patient denies ever having similar pain. She denies SOB, N/V, dizziness, diaphoresis or radiation of the pain. She has fh of heart attack but has never had one herself. She smokes 6-7 cigs/day. No h/o COPD.   Past Medical History:  Diagnosis Date  . Arthritis   . Diabetes mellitus without complication ([place])   . Gout   . Hypertension     There are no active problems to display for this patient.   History reviewed. No pertinent surgical history.  Prior to Admission medications   Medication Sig Start Date End Date Taking? Authorizing Provider  ondansetron (ZOFRAN ODT) 8 MG disintegrating tablet Take 1 tablet (8 mg total) by mouth every 8 (eight) hours as needed for nausea or vomiting. 06/11/15   Carrie Mew, MD  ranitidine (ZANTAC) 150 MG capsule Take 1 capsule (150 mg total) by mouth 2 (two) times daily. Patient taking differently: Take 150 mg by mouth 2 (two) times daily as needed for heartburn.  06/11/15   Carrie Mew, MD    Allergies Patient has no known allergies.  Family History Father - heart attack  Social  History Social History   Tobacco Use  . Smoking status: Current Every Day Smoker  . Smokeless tobacco: Current User  Substance Use Topics  . Alcohol use: Yes    Comment: States she drinks a little and sometimes a lot.  . Drug use: No    Review of Systems  Constitutional: Negative for fever. Eyes: Negative for visual changes. ENT: Negative for sore throat. Neck: No neck pain  Cardiovascular: + chest pain. Respiratory: Negative for shortness of breath. Gastrointestinal: Negative for abdominal pain, vomiting or diarrhea. Genitourinary: Negative for dysuria. Musculoskeletal: Negative for back pain. Skin: Negative for rash. Neurological: Negative for headaches, weakness or numbness. Psych: No SI or HI  ____________________________________________   PHYSICAL EXAM:  VITAL SIGNS: ED Triage Vitals  Enc Vitals Group     BP 04/22/17 0050 118/88     Pulse Rate 04/22/17 0050 82     Resp 04/22/17 0050 18     Temp --      Temp Source 04/22/17 0050 Oral     SpO2 04/22/17 0050 97 %     Weight 04/22/17 0051 162 lb 1.6 oz (73.5 kg)     Height --      Head Circumference --      Peak Flow --      Pain Score --      Pain Loc --      Pain Edu? --      Excl. in [place]? --  Constitutional: Alert and oriented. Well appearing and in no apparent distress. HEENT:      Head: Normocephalic and atraumatic.         Eyes: Conjunctivae are normal. Sclera is non-icteric.       Mouth/Throat: Mucous membranes are moist.       Neck: Supple with no signs of meningismus. Cardiovascular: Regular rate and rhythm. No murmurs, gallops, or rubs. 2+ symmetrical distal pulses are present in all extremities. No JVD. Respiratory: Normal respiratory effort. Lungs are clear to auscultation bilaterally. No wheezes, crackles, or rhonchi.  Gastrointestinal: Soft, non tender, and non distended with positive bowel sounds. No rebound or guarding. Musculoskeletal: Nontender with normal range of motion in all  extremities. No edema, cyanosis, or erythema of extremities. Neurologic: Normal speech and language. Face is symmetric. Moving all extremities. No gross focal neurologic deficits are appreciated. Skin: Skin is warm, dry and intact. No rash noted. Psychiatric: Mood and affect are normal. Speech and behavior are normal.  ____________________________________________   LABS (all labs ordered are listed, but only abnormal results are displayed)  Labs Reviewed  BASIC METABOLIC PANEL - Abnormal; Notable for the following components:      Result Value   Sodium 132 (*)    Chloride 99 (*)    Glucose, Bld 103 (*)    BUN <5 (*)    All other components within normal limits  CBC - Abnormal; Notable for the following components:   WBC 3.5 (*)    RBC 3.57 (*)    Hemoglobin 11.3 (*)    HCT 32.4 (*)    RDW 15.2 (*)    All other components within normal limits  TROPONIN I  TROPONIN I   ____________________________________________  EKG  ED ECG REPORT I, Rudene Re, the attending physician, personally viewed and interpreted this ECG.  Normal sinus rhythm, rate of 71, first-degree AV block, normal QRS and QTc intervals, normal axis, no ST elevations or depressions. No significant changes when compared to prior from 2017 ____________________________________________  RADIOLOGY  CXR: Negative  ____________________________________________   PROCEDURES  Procedure(s) performed: None Procedures Critical Care performed:  None ____________________________________________   INITIAL IMPRESSION / ASSESSMENT AND PLAN / ED COURSE  61 y.o. female with h/o smoking, DM and HTN however not on any meds for years since loosing weight who presents for evaluationof an episode of CP lasting 1 hours that started as she was changing her bed linens and lifting her mattress. Patient is asymptomatic at this time. EKG with no evidence of ischemia or arrhythmia. Heart score of 3. Differential diagnoses  including ACS versus musculoskeletal versus pneumothorax versus GERD.    ----------------------------------------- 1:00 AM on 04/22/2017 ----------------------------------------- OBSERVATION CARE: This patient is being placed under observation care for the following reasons: Chest pain with repeat testing to rule out ischemia  ----------------------------------------- 3:00 AM on 04/22/2017 ----------------------------------------- Troponin x 1 negative. Labs, CXR with no acute findings. 2nd trop pending. Patient remains pain free and well appearing  ----------------------------------------- 5:56 AM on 04/22/2017 ----------------------------------------- END OF OBSERVATION STATUS: After an appropriate period of observation, this patient is being discharged due to the following reason(s):  Patient remains in no pain. Troponin 2 is negative. At this time I believe patient is safe for discharge and close follow-up with cardiology as an outpatient. She was referred to Dr. Ubaldo Glassing. Discussed return precautions with patient.     As part of my medical decision making, I reviewed the following data within the [place] notes reviewed  and incorporated, Labs reviewed , EKG interpreted , Old EKG reviewed, Old chart reviewed, Radiograph reviewed , Notes from prior ED visits and [place] Controlled Substance Database    Pertinent labs & imaging results that were available during my care of the patient were reviewed by me and considered in my medical decision making (see chart for details).    ____________________________________________   FINAL CLINICAL IMPRESSION(S) / ED DIAGNOSES  Final diagnoses:  Chest pain, unspecified type      NEW MEDICATIONS STARTED DURING THIS VISIT:  ED Discharge Orders    None       Note:  This document was prepared using Dragon voice recognition software and may include unintentional dictation errors.    Rudene Re,  MD 04/22/17 [phone]

## 2017-04-22 NOTE — ED Notes (Signed)
Patient transported to X-ray 

## 2017-04-22 NOTE — ED Triage Notes (Signed)
Patient comes from home with c/o left side CP after moving furniture.  Family stated the sharp pain went from left breast into her left neck and left lower back.  Pt talks with a scratchy voice and has had this "for a while".  Pt states she takes no medications at home, and has diabetes and hypertension but lost so much weight she was taken off the medicine.

## 2017-04-22 NOTE — ED Provider Notes (Signed)
[facility] Emergency Department Provider Note  ____________________________________________  Time seen: Approximately 12:52 AM  I have reviewed the triage vital signs and the nursing notes.   HISTORY  Chief Complaint Chest Pain   HPI Donna Aguirre is a 62 y.o. female with h/o smoking, DM and HTN however not on any meds for years since loosing weight who presents for evaluationof CP. According to patient, she was changing her bed linens ealier this evening when she developed pain in the L side of her chest. Patient reports severe pain that she describes as if someone punched her in the chest that lasted about 1 hour. No pain at this time. According to EMS there was a lot of commotion in the house because patient did not want to come to the ED and family members were very upset and arguing with patient for her to come. Patient denies ever having similar pain. She denies SOB, N/V, dizziness, diaphoresis or radiation of the pain. She has fh of heart attack but has never had one herself. She smokes 6-7 cigs/day. No h/o COPD.   Past Medical History:  Diagnosis Date  . Arthritis   . Diabetes mellitus without complication ([place])   . Gout   . Hypertension     There are no active problems to display for this patient.   History reviewed. No pertinent surgical history.  Prior to Admission medications   Medication Sig Start Date End Date Taking? Authorizing Provider  ondansetron (ZOFRAN ODT) 8 MG disintegrating tablet Take 1 tablet (8 mg total) by mouth every 8 (eight) hours as needed for nausea or vomiting. 06/11/15   Carrie Mew, MD  ranitidine (ZANTAC) 150 MG capsule Take 1 capsule (150 mg total) by mouth 2 (two) times daily. Patient taking differently: Take 150 mg by mouth 2 (two) times daily as needed for heartburn.  06/11/15   Carrie Mew, MD    Allergies Patient has no known allergies.  Family History Father - heart attack  Social  History Social History   Tobacco Use  . Smoking status: Current Every Day Smoker  . Smokeless tobacco: Current User  Substance Use Topics  . Alcohol use: Yes    Comment: States she drinks a little and sometimes a lot.  . Drug use: No    Review of Systems  Constitutional: Negative for fever. Eyes: Negative for visual changes. ENT: Negative for sore throat. Neck: No neck pain  Cardiovascular: + chest pain. Respiratory: Negative for shortness of breath. Gastrointestinal: Negative for abdominal pain, vomiting or diarrhea. Genitourinary: Negative for dysuria. Musculoskeletal: Negative for back pain. Skin: Negative for rash. Neurological: Negative for headaches, weakness or numbness. Psych: No SI or HI  ____________________________________________   PHYSICAL EXAM:  VITAL SIGNS: ED Triage Vitals  Enc Vitals Group     BP 04/22/17 0050 118/88     Pulse Rate 04/22/17 0050 82     Resp 04/22/17 0050 18     Temp --      Temp Source 04/22/17 0050 Oral     SpO2 04/22/17 0050 97 %     Weight 04/22/17 0051 162 lb 1.6 oz (73.5 kg)     Height --      Head Circumference --      Peak Flow --      Pain Score --      Pain Loc --      Pain Edu? --      Excl. in [place]? --  Constitutional: Alert and oriented. Well appearing and in no apparent distress. HEENT:      Head: Normocephalic and atraumatic.         Eyes: Conjunctivae are normal. Sclera is non-icteric.       Mouth/Throat: Mucous membranes are moist.       Neck: Supple with no signs of meningismus. Cardiovascular: Regular rate and rhythm. No murmurs, gallops, or rubs. 2+ symmetrical distal pulses are present in all extremities. No JVD. Respiratory: Normal respiratory effort. Lungs are clear to auscultation bilaterally. No wheezes, crackles, or rhonchi.  Gastrointestinal: Soft, non tender, and non distended with positive bowel sounds. No rebound or guarding. Musculoskeletal: Nontender with normal range of motion in all  extremities. No edema, cyanosis, or erythema of extremities. Neurologic: Normal speech and language. Face is symmetric. Moving all extremities. No gross focal neurologic deficits are appreciated. Skin: Skin is warm, dry and intact. No rash noted. Psychiatric: Mood and affect are normal. Speech and behavior are normal.  ____________________________________________   LABS (all labs ordered are listed, but only abnormal results are displayed)  Labs Reviewed  BASIC METABOLIC PANEL - Abnormal; Notable for the following components:      Result Value   Sodium 132 (*)    Chloride 99 (*)    Glucose, Bld 103 (*)    BUN <5 (*)    All other components within normal limits  CBC - Abnormal; Notable for the following components:   WBC 3.5 (*)    RBC 3.57 (*)    Hemoglobin 11.3 (*)    HCT 32.4 (*)    RDW 15.2 (*)    All other components within normal limits  TROPONIN I  TROPONIN I   ____________________________________________  EKG  ED ECG REPORT I, Rudene Re, the attending physician, personally viewed and interpreted this ECG.  Normal sinus rhythm, rate of 71, first-degree AV block, normal QRS and QTc intervals, normal axis, no ST elevations or depressions. No significant changes when compared to prior from 2017 ____________________________________________  RADIOLOGY CXR: No active cardiopulmonary disease.  ____________________________________________   PROCEDURES  Procedure(s) performed: None Procedures Critical Care performed:  None ____________________________________________   INITIAL IMPRESSION / ASSESSMENT AND PLAN / ED COURSE  61 y.o. female with h/o smoking, DM and HTN however not on any meds for years since loosing weight who presents for evaluationof an episode of CP lasting 1 hours that started as she was changing her bed linens and lifting her mattress. Patient is asymptomatic at this time.  Chest pain in a 61 y.o. female with low suspicion for cardiac  (HEART score 3) or other serious etiology (including aortic dissection, pneumonia, pneumothorax, or pulmonary embolism) based her history and physical exam in the ED today. EKG normal. Plan for labs including CBC, chemistries and troponin now and in 3 hours, CXR and re-evaluation for disposition. Will give full dose ASA . Will observe patient on cardiac monitor while in the ED.     ----------------------------------------- 12:51 AM on 04/22/2017 ----------------------------------------- OBSERVATION CARE: This patient is being placed under observation care for the following reasons: Chest pain with repeat testing to rule out ischemia   ----------------------------------------- 5:52 AM on 04/22/2017 ----------------------------------------- END OF OBSERVATION STATUS: After an appropriate period of observation, this patient is being discharged due to the following reason(s):  Patient remains pain-free, troponin 2 negative. Workup in no acute findings. At this time patient can be discharged home with close follow-up with cardiology. Discussed return precautions with patient.    As part  of my medical decision making, I reviewed the following data within the [place] notes reviewed and incorporated, Labs reviewed , EKG interpreted , Old EKG reviewed, Old chart reviewed, Radiograph reviewed , Notes from prior ED visits and [place] Controlled Substance Database    Pertinent labs & imaging results that were available during my care of the patient were reviewed by me and considered in my medical decision making (see chart for details).    ____________________________________________   FINAL CLINICAL IMPRESSION(S) / ED DIAGNOSES  Final diagnoses:  Chest pain, unspecified type      NEW MEDICATIONS STARTED DURING THIS VISIT:  ED Discharge Orders    None       Note:  This document was prepared using Dragon voice recognition software and may include unintentional  dictation errors.    Rudene Re, MD 04/22/17 [phone]

## 2017-04-22 NOTE — Discharge Instructions (Signed)

## 2018-10-16 ENCOUNTER — Encounter: Payer: Self-pay | Admitting: Emergency Medicine

## 2018-10-16 ENCOUNTER — Emergency Department: Payer: Medicaid Other

## 2018-10-16 ENCOUNTER — Emergency Department
Admission: EM | Admit: 2018-10-16 | Discharge: 2018-10-16 | Disposition: A | Discharge status: Other Acute Inpatient Hospital | Payer: Medicaid Other | Attending: Emergency Medicine | Admitting: Emergency Medicine

## 2018-10-16 ENCOUNTER — Other Ambulatory Visit: Payer: Self-pay

## 2018-10-16 DIAGNOSIS — I10 Essential (primary) hypertension: Secondary | ICD-10-CM | POA: Insufficient documentation

## 2018-10-16 DIAGNOSIS — Z20828 Contact with and (suspected) exposure to other viral communicable diseases: Secondary | ICD-10-CM | POA: Insufficient documentation

## 2018-10-16 DIAGNOSIS — C180 Malignant neoplasm of cecum: Secondary | ICD-10-CM | POA: Diagnosis not present

## 2018-10-16 DIAGNOSIS — R1012 Left upper quadrant pain: Secondary | ICD-10-CM | POA: Diagnosis present

## 2018-10-16 DIAGNOSIS — C159 Malignant neoplasm of esophagus, unspecified: Secondary | ICD-10-CM | POA: Diagnosis not present

## 2018-10-16 DIAGNOSIS — K251 Acute gastric ulcer with perforation: Secondary | ICD-10-CM | POA: Insufficient documentation

## 2018-10-16 DIAGNOSIS — D490 Neoplasm of unspecified behavior of digestive system: Secondary | ICD-10-CM

## 2018-10-16 DIAGNOSIS — K255 Chronic or unspecified gastric ulcer with perforation: Secondary | ICD-10-CM

## 2018-10-16 DIAGNOSIS — Z1159 Encounter for screening for other viral diseases: Secondary | ICD-10-CM | POA: Diagnosis not present

## 2018-10-16 DIAGNOSIS — E119 Type 2 diabetes mellitus without complications: Secondary | ICD-10-CM | POA: Diagnosis not present

## 2018-10-16 DIAGNOSIS — Z79899 Other long term (current) drug therapy: Secondary | ICD-10-CM | POA: Diagnosis not present

## 2018-10-16 DIAGNOSIS — F17210 Nicotine dependence, cigarettes, uncomplicated: Secondary | ICD-10-CM | POA: Diagnosis not present

## 2018-10-16 LAB — SARS CORONAVIRUS 2 BY RT PCR (HOSPITAL ORDER, PERFORMED IN ~~LOC~~ HOSPITAL LAB): SARS Coronavirus 2: NEGATIVE

## 2018-10-16 LAB — CBC WITH DIFFERENTIAL/PLATELET
Abs Immature Granulocytes: 0.01 10*3/uL (ref 0.00–0.07)
Basophils Absolute: 0 10*3/uL (ref 0.0–0.1)
Basophils Relative: 0 %
Eosinophils Absolute: 0 10*3/uL (ref 0.0–0.5)
Eosinophils Relative: 0 %
HCT: 38.5 % (ref 36.0–46.0)
Hemoglobin: 12.3 g/dL (ref 12.0–15.0)
Immature Granulocytes: 0 %
Lymphocytes Relative: 22 %
Lymphs Abs: 1.2 10*3/uL (ref 0.7–4.0)
MCH: 28.9 pg (ref 26.0–34.0)
MCHC: 31.9 g/dL (ref 30.0–36.0)
MCV: 90.6 fL (ref 80.0–100.0)
Monocytes Absolute: 0.3 10*3/uL (ref 0.1–1.0)
Monocytes Relative: 6 %
Neutro Abs: 3.9 10*3/uL (ref 1.7–7.7)
Neutrophils Relative %: 72 %
Platelets: 174 10*3/uL (ref 150–400)
RBC: 4.25 MIL/uL (ref 3.87–5.11)
RDW: 19.2 % — ABNORMAL HIGH (ref 11.5–15.5)
WBC: 5.4 10*3/uL (ref 4.0–10.5)
nRBC: 0 % (ref 0.0–0.2)

## 2018-10-16 LAB — TYPE AND SCREEN
ABO/RH(D): A POS
Antibody Screen: NEGATIVE

## 2018-10-16 LAB — COMPREHENSIVE METABOLIC PANEL
ALT: 13 U/L (ref 0–44)
AST: 35 U/L (ref 15–41)
Albumin: 2.9 g/dL — ABNORMAL LOW (ref 3.5–5.0)
Alkaline Phosphatase: 57 U/L (ref 38–126)
Anion gap: 15 (ref 5–15)
BUN: 7 mg/dL — ABNORMAL LOW (ref 8–23)
CO2: 19 mmol/L — ABNORMAL LOW (ref 22–32)
Calcium: 8.6 mg/dL — ABNORMAL LOW (ref 8.9–10.3)
Chloride: 103 mmol/L (ref 98–111)
Creatinine, Ser: 0.62 mg/dL (ref 0.44–1.00)
GFR calc Af Amer: >60 mL/min (ref >60)
GFR calc non Af Amer: >60 mL/min (ref >60)
Glucose, Bld: 118 mg/dL — ABNORMAL HIGH (ref 70–99)
Potassium: 3.2 mmol/L — ABNORMAL LOW (ref 3.5–5.1)
Sodium: 137 mmol/L (ref 135–145)
Total Bilirubin: 1.5 mg/dL — ABNORMAL HIGH (ref 0.3–1.2)
Total Protein: 7.4 g/dL (ref 6.5–8.1)

## 2018-10-16 LAB — PROTIME-INR
INR: 1.2 (ref 0.8–1.2)
Prothrombin Time: 14.9 seconds (ref 11.4–15.2)

## 2018-10-16 LAB — LIPASE, BLOOD: Lipase: 80 U/L — ABNORMAL HIGH (ref 11–51)

## 2018-10-16 LAB — TROPONIN I: Troponin I: <0.03 ng/mL (ref <0.03)

## 2018-10-16 IMAGING — CT CT ABDOMEN AND PELVIS WITH CONTRAST
2 of 5 series · 15 of 46 positions shown, 17 images · IV contrast (APPLIED)
Comparison: [DATE]

CLINICAL DATA: Left lower quadrant abdominal pain for 2 days.

EXAM:
CT ABDOMEN AND PELVIS WITH CONTRAST
TECHNIQUE: Multidetector CT imaging of the abdomen and pelvis was performed
using the standard protocol following bolus administration of
intravenous contrast.
CONTRAST:  100mL OMNIPAQUE IOHEXOL 300 MG/ML  SOLN

[Series 2: routine abd/pel with · axial · 0.71mm/px · z∈[-500,-90]mm · 12 of 92 slices shown, 14 images]
[im 5/92  soft-tissue]
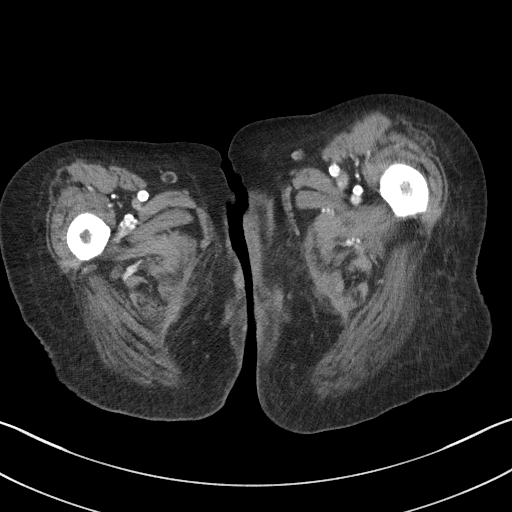
[im 5/92  bone]
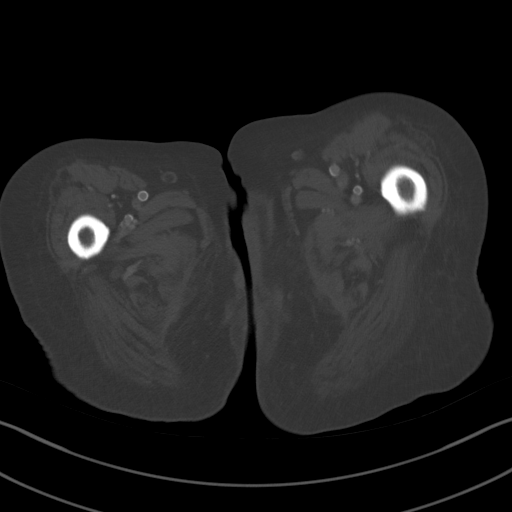
[im 15/92  soft-tissue]
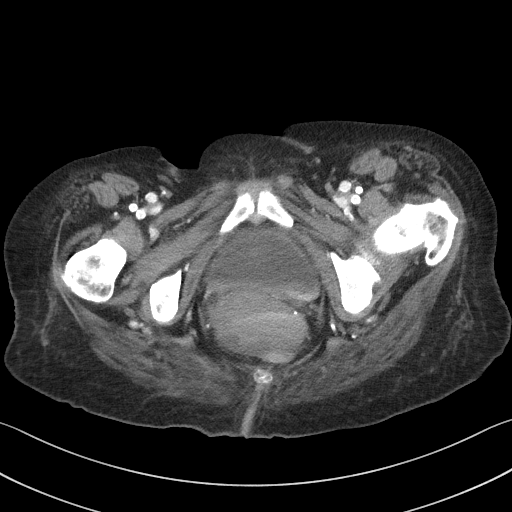
[im 20/92  soft-tissue]
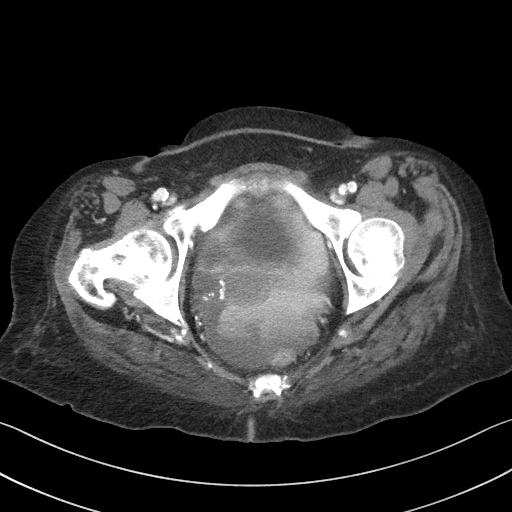
[im 29/92  soft-tissue]
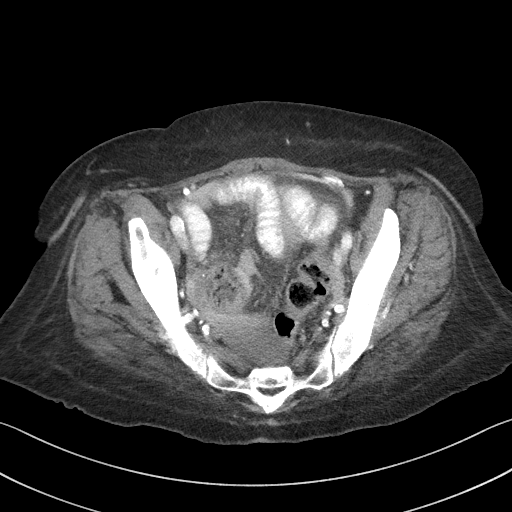
[im 34/92  soft-tissue]
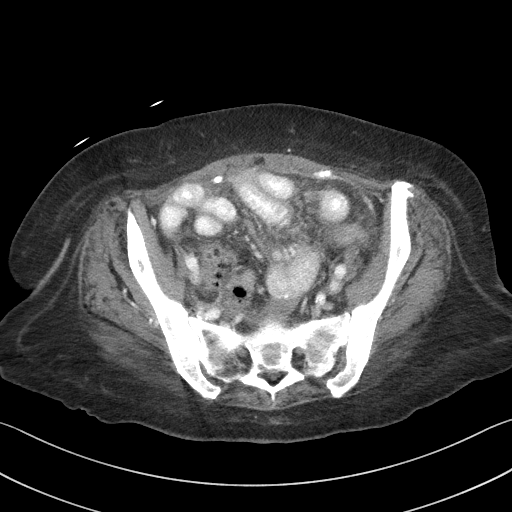
[im 44/92  soft-tissue]
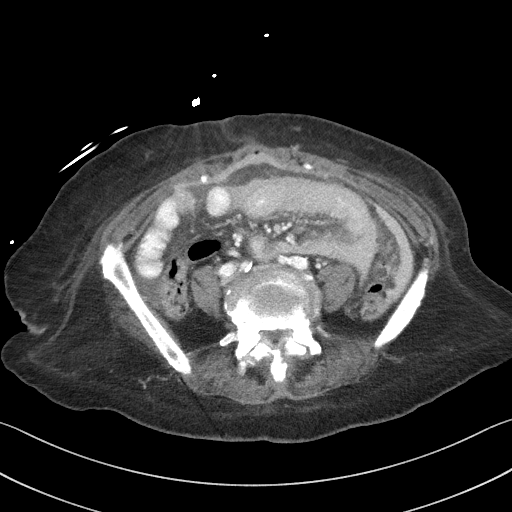
[im 48/92  soft-tissue]
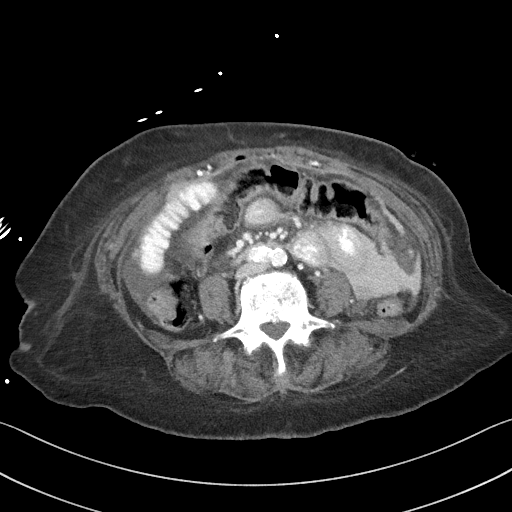
[im 58/92  soft-tissue]
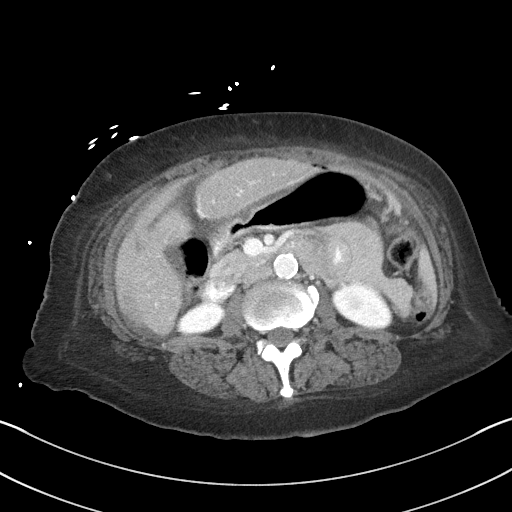
[im 63/92  soft-tissue]
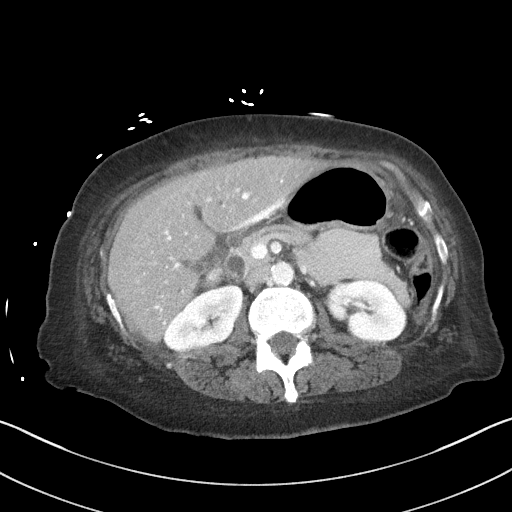
[im 63/92  bone]
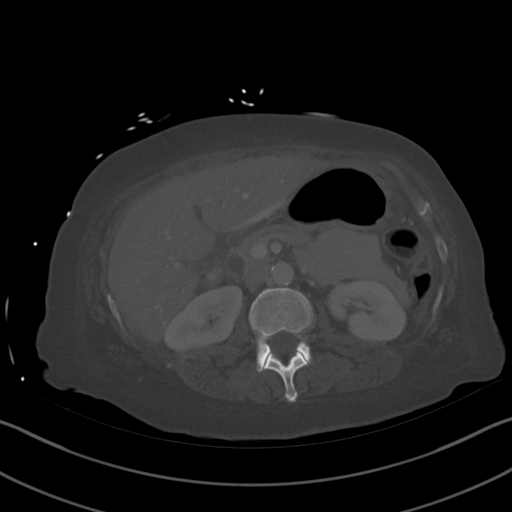
[im 72/92  soft-tissue]
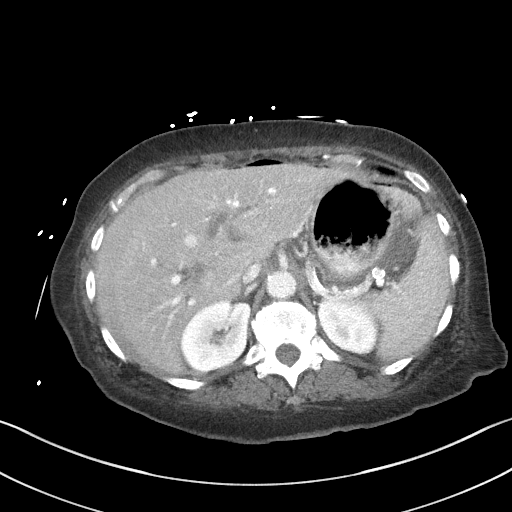
[im 77/92  soft-tissue]
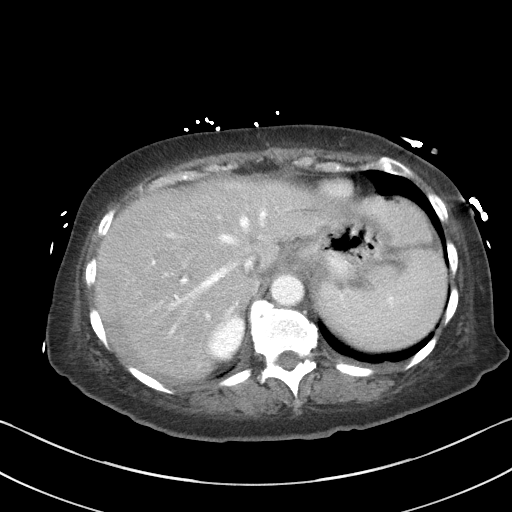
[im 87/92  soft-tissue]
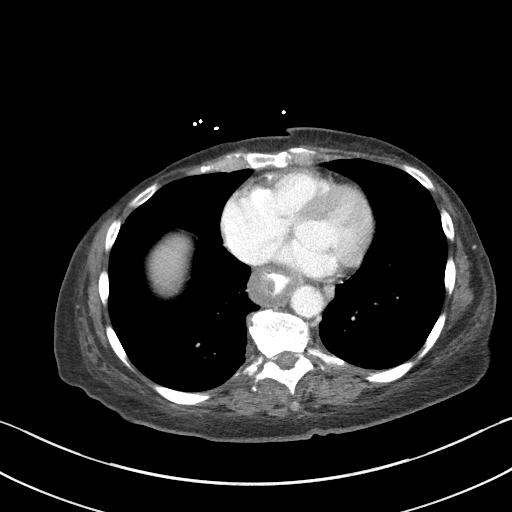

[Series 5: coronal st · coronal · 0.81mm/px · 3 of 76 slices shown]
[im 26/76  soft-tissue]
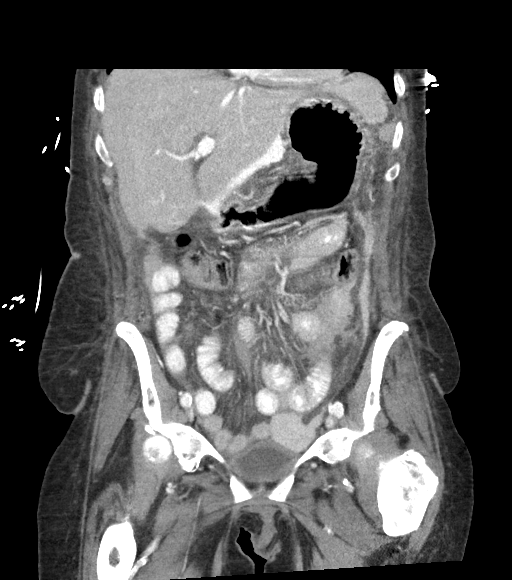
[im 34/76  soft-tissue]
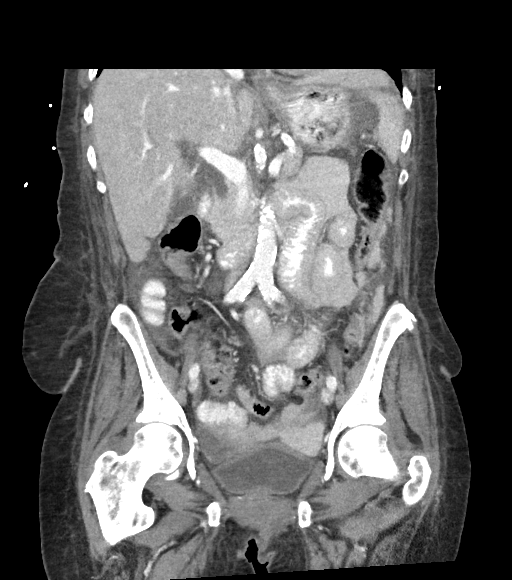
[im 42/76  soft-tissue]
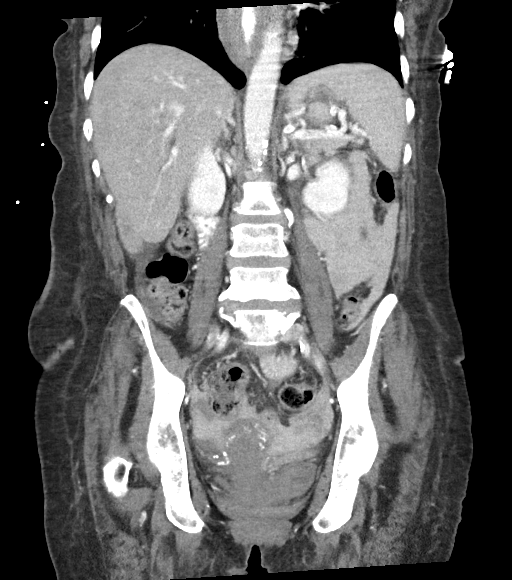

[15 of 46 positions shown; findings below may reference images not displayed]

FINDINGS: Lower chest: Very marked circumferential wall thickening noted in
the lower esophagus (image 4/series 2).

Hepatobiliary: No suspicious focal abnormality within the liver
parenchyma. Gallbladder decompressed. Intra and extrahepatic biliary
duct dilatation evident with extrahepatic common duct measuring up
to 12 mm diameter. Dilated common bile duct tapers into the ampulla.

Pancreas: Pancreatic parenchymal atrophy without substantial
dilatation of the main duct.

Spleen: No splenomegaly. No focal mass lesion.

Adrenals/Urinary Tract: No adrenal nodule or mass. Kidneys
unremarkable. No evidence for hydroureter. The urinary bladder
appears normal for the degree of distention.

Stomach/Bowel: Wall thickening noted at the esophagogastric
junction. Mild wall thickening noted in the gastric antrum near the
pylorus with a probable defect in the anterior wall (best seen on
delayed image 25 of series 7). Undiluted high density oral contrast
material is seen along the anterior wall the stomach and posterior
capsule of the lateral segment left liver. The free, extraluminal
contrast tracks up along the falciform ligament. Small bowel wall
thickening is noted in the left abdomen. No evidence for small bowel
obstruction. Concern for wall thickening noted in the cecum (64/2).
Remaining colonic segments are unremarkable.

Vascular/Lymphatic: There is abdominal aortic atherosclerosis
without aneurysm. There is no gastrohepatic or hepatoduodenal
ligament lymphadenopathy. No intraperitoneal or retroperitoneal
lymphadenopathy. No pelvic sidewall lymphadenopathy.

Reproductive: The uterus is unremarkable.  There is no adnexal mass.

Other: Large volume mixed attenuation free fluid in the abdomen is
associated with intraperitoneal free air. There is high attenuation
undiluted contrast material between the stomach and the left liver.
Moderate attenuation fluid is seen around the liver and spleen as
well as in the mesentery. Mixed attenuation fluid is noted in the
pelvis.

Musculoskeletal: No worrisome lytic or sclerotic osseous
abnormality.
IMPRESSION: 1. Intraperitoneal free air with moderate to large volume
intraperitoneal free fluid of mixed attenuation. Very high
attenuation fluid between the stomach and left liver suggests
undiluted oral contrast material. Moderate attenuation fluid around
the liver and spleen, in the central mesentery, in the upper para
colic gutters is compatible with more dilute contrast material.
Mixed attenuation fluid in the pelvis is compatible with ascites
mixed with contrast. Given the associated free air, very high
density of the free fluid in the upper abdomen and volume of fluid
in the peritoneal cavity, findings are not felt to be related to
extravasated intravenous contrast/hemorrhage.
2. Etiology of the above findings appears to be a defect in the
anterior wall of the gastric antrum just proximal to the pylorus,
suggesting perforated ulcer.
3. Marked circumferential thickening in the lower esophagus
extending into the esophagogastric junction. This is more prominent
than typically seen for esophagitis and is concerning for esophageal
neoplasm.
4. Apparent wall thickening in the cecum also raises concern for
neoplasm although this may be secondary.
5. Fairly diffuse circumferential wall thickening in nondilated
small bowel loops likely secondary.
6. Intra and extrahepatic biliary duct dilatation similar to
[DATE].

Critical Value/emergent results were called by telephone at the time
of interpretation on [DATE] at [DATE] to FRETEL, PA
who verbally acknowledged these results.

## 2018-10-16 MED ORDER — IOHEXOL 300 MG/ML  SOLN
100.0000 mL | Freq: Once | INTRAMUSCULAR | Status: AC | PRN
  Administered 2018-10-16: 17:00:00 100 mL via INTRAVENOUS

## 2018-10-16 MED ORDER — ONDANSETRON HCL 4 MG/2ML IJ SOLN
4.0000 mg | Freq: Once | INTRAMUSCULAR | Status: AC
  Administered 2018-10-16: 16:00:00 4 mg via INTRAVENOUS
  Filled 2018-10-16: qty 2

## 2018-10-16 MED ORDER — IOHEXOL 240 MG/ML SOLN
50.0000 mL | Freq: Once | INTRAMUSCULAR | Status: AC | PRN
  Administered 2018-10-16: 50 mL via ORAL

## 2018-10-16 MED ORDER — SODIUM CHLORIDE 0.9 % IV BOLUS
1000.0000 mL | Freq: Once | INTRAVENOUS | Status: AC
  Administered 2018-10-16: 21:00:00 1000 mL via INTRAVENOUS

## 2018-10-16 MED ORDER — SODIUM CHLORIDE 0.9 % IV BOLUS
1000.0000 mL | Freq: Once | INTRAVENOUS | Status: AC
  Administered 2018-10-16: 16:00:00 1000 mL via INTRAVENOUS

## 2018-10-16 MED ORDER — NOREPINEPHRINE 4 MG/250ML-% IV SOLN
0.0000 ug/min | INTRAVENOUS | Status: DC
  Administered 2018-10-16: 19:00:00 2 ug/min via INTRAVENOUS
  Filled 2018-10-16: qty 250

## 2018-10-16 MED ORDER — SODIUM CHLORIDE 0.9 % IV BOLUS
1000.0000 mL | Freq: Once | INTRAVENOUS | Status: AC
  Administered 2018-10-16: 1000 mL via INTRAVENOUS

## 2018-10-16 MED ORDER — PIPERACILLIN-TAZOBACTAM 3.375 G IVPB 30 MIN
3.3750 g | Freq: Once | INTRAVENOUS | Status: AC
  Administered 2018-10-16: 19:00:00 3.375 g via INTRAVENOUS
  Filled 2018-10-16: qty 50

## 2018-10-16 MED ORDER — NOREPINEPHRINE 4 MG/250ML-% IV SOLN
INTRAVENOUS | Status: AC
  Administered 2018-10-16: 19:00:00 2 ug/min via INTRAVENOUS
  Filled 2018-10-16: qty 250

## 2018-10-16 MED ORDER — MORPHINE SULFATE (PF) 2 MG/ML IV SOLN
2.0000 mg | INTRAVENOUS | Status: AC | PRN
  Administered 2018-10-16 (×2): 2 mg via INTRAVENOUS
  Filled 2018-10-16 (×2): qty 1

## 2018-10-16 NOTE — ED Notes (Signed)
This RN to bedside at this time, pt resting comfortably in bed, c/o continued pain at this time. Pt remains alert and oriented. Offered to turn lights down for patient, pt refused. Alerted her that she would be transported in approx 30-45 mins, pt states understanding. Will continue to monitor for further patient needs.

## 2018-10-16 NOTE — ED Notes (Signed)
Discussed with MD and PA pt's BP. Per MD okay to give pain medication at this time.

## 2018-10-16 NOTE — ED Notes (Signed)
Explained to patient would wait approx 10 mins after admin of Zofran for patient to begin drinking oral contrast. Pt states understanding, however is notedly upset about not being able to drink. This RN explained would wait for nausea medicine to kick in prior to patient drinking, also explained that per PA pt could not have anything for pain due to her BP remaining hypotensive. Pt states understanding at this time.

## 2018-10-16 NOTE — ED Provider Notes (Signed)
Patient seen with PA Cuthriell.  Patient needed central line.  Oral consent was obtained.  Patient prepped and draped in usual sterile fashion area anesthetized with 1% lidocaine vein found by palpation of the artery and then with ultrasound.  Needle was inserted initially in spite of ultrasound hit the artery but then was able to get the vein and the threaded the wire very well central line was inserted without any further difficulty and sutured in place she tolerated well.   Nena Polio, MD 10/16/18 Lurena Nida

## 2018-10-16 NOTE — ED Notes (Signed)
Pt returned from CT °

## 2018-10-16 NOTE — ED Provider Notes (Signed)
[facility] Emergency Department Provider Note  ____________________________________________  Time seen: Approximately 3:10 PM  I have reviewed the triage vital signs and the nursing notes.   HISTORY  Chief Complaint Abdominal Pain    HPI Donna Aguirre is a 63 y.o. female who presents the emergency department complaining of left upper and left lower quadrant abdominal pain that began today.  Patient reports that she is having sharp pain in the left upper and lower quadrant of her abdomen starting today after feeling a "pop".  She states that she has had some accompanying nausea, but no emesis, diarrhea.  Patient denies any urinary symptoms.  She states that she has no history of ongoing GI complaints.  He has never had symptoms like this before.  She does have a history of diabetes, gout, hypertension but has no complaints with chronic medical problems.  Patient does not take any medications for his complaint.  She did arrive via EMS.  Patient's blood pressure is more soft with EMS and she was receiving IV fluids on arrival.  Patient denies any headache or neck pain, chest pain, shortness of breath.  No fevers or chills.         Past Medical History:  Diagnosis Date  . Arthritis   . Diabetes mellitus without complication ([place])   . Gout   . Hypertension     There are no active problems to display for this patient.   History reviewed. No pertinent surgical history.  Prior to Admission medications   Medication Sig Start Date End Date Taking? Authorizing Provider  acetaminophen (TYLENOL) 325 MG tablet Take 650 mg by mouth every 6 (six) hours as needed.   Yes [provider]  ibuprofen (ADVIL) 200 MG tablet Take 200 mg by mouth every 6 (six) hours as needed.   Yes [provider]  omeprazole (PRILOSEC) 20 MG capsule Take 20 mg by mouth daily.   Yes [provider]    Allergies Patient has no known allergies.  History  reviewed. No pertinent family history.  Social History Social History   Tobacco Use  . Smoking status: Current Every Day Smoker    Types: Cigarettes  . Smokeless tobacco: Current User  Substance Use Topics  . Alcohol use: Yes    Comment: States she drinks a little and sometimes a lot.  . Drug use: No     Review of Systems  Constitutional: No fever/chills Eyes: No visual changes. No discharge ENT: No upper respiratory complaints. Cardiovascular: no chest pain. Respiratory: no cough. No SOB. Gastrointestinal: Left upper quadrant and left lower quadrant abdominal pain.  Positive nausea, no vomiting.  No diarrhea.  No constipation. Genitourinary: Negative for dysuria. No hematuria Musculoskeletal: Negative for musculoskeletal pain. Skin: Negative for rash, abrasions, lacerations, ecchymosis. Neurological: Negative for headaches, focal weakness or numbness. 10-point ROS otherwise negative.  ____________________________________________   PHYSICAL EXAM:  VITAL SIGNS: ED Triage Vitals  Enc Vitals Group     BP 10/16/18 1503 (!) 85/56     Pulse Rate 10/16/18 1503 70     Resp 10/16/18 1503 (!) 23     Temp --      Temp Source 10/16/18 1503 Oral     SpO2 --      Weight 10/16/18 1508 144 lb (65.3 kg)     Height 10/16/18 1508 5\' 5"  (1.651 m)     Head Circumference --      Peak Flow --      Pain Score  10/16/18 1507 10     Pain Loc --      Pain Edu? --      Excl. in [place]? --      Constitutional: Alert and oriented. Well appearing and in no acute distress. Eyes: Conjunctivae are normal. PERRL. EOMI. Head: Atraumatic. ENT:      Ears:       Nose: No congestion/rhinnorhea.      Mouth/Throat: Mucous membranes are moist.  Neck: No stridor.   Hematological/Lymphatic/Immunilogical: No cervical lymphadenopathy. Cardiovascular: Normal rate, regular rhythm. Normal S1 and S2.  Good peripheral circulation. Respiratory: Normal respiratory effort without tachypnea or retractions. Lungs  CTAB. Good air entry to the bases with no decreased or absent breath sounds. Gastrointestinal: No visible abnormality to the external abdominal wall.  Bowel sounds 4 quadrants.  No bruits.  Soft to palpation all quadrants.  Patient is very tender to palpation diffusely in the left upper and lower quadrants.  No right-sided tenderness to palpation.  No guarding or rigidity. No palpable or pulsatile masses. No distention. No CVA tenderness. Musculoskeletal: Full range of motion to all extremities. No gross deformities appreciated. Neurologic:  Normal speech and language. No gross focal neurologic deficits are appreciated.  Skin:  Skin is warm, dry and intact. No rash noted. Psychiatric: Mood and affect are normal. Speech and behavior are normal. Patient exhibits appropriate insight and judgement.   ____________________________________________   LABS (all labs ordered are listed, but only abnormal results are displayed)  Labs Reviewed  COMPREHENSIVE METABOLIC PANEL - Abnormal; Notable for the following components:      Result Value   Potassium 3.2 (*)    CO2 19 (*)    Glucose, Bld 118 (*)    BUN 7 (*)    Calcium 8.6 (*)    Albumin 2.9 (*)    Total Bilirubin 1.5 (*)    All other components within normal limits  LIPASE, BLOOD - Abnormal; Notable for the following components:   Lipase 80 (*)    All other components within normal limits  CBC WITH DIFFERENTIAL/PLATELET - Abnormal; Notable for the following components:   RDW 19.2 (*)    All other components within normal limits  SARS CORONAVIRUS 2 (HOSPITAL ORDER, [place] LAB)  TROPONIN I  PROTIME-INR  URINALYSIS, COMPLETE (UACMP) WITH MICROSCOPIC  TYPE AND SCREEN   ____________________________________________  EKG   ____________________________________________  RADIOLOGY I personally viewed and evaluated these images as part of my medical decision making, as well as reviewing the written report by the  radiologist.  I discussed the findings with the radiologist.  Patient has multiple concerning findings including what appears to be leaking oral contrast from gastric antrum perforation, as well as tumors in the lower esophagus and cecum abnormality.  Concerns are for gastric perforation as well as esophageal/colon cancer.  Ct Abdomen Pelvis W Contrast  Result Date: 10/16/2018 CLINICAL DATA:  Left lower quadrant abdominal pain for 2 days. EXAM: CT ABDOMEN AND PELVIS WITH CONTRAST TECHNIQUE: Multidetector CT imaging of the abdomen and pelvis was performed using the standard protocol following bolus administration of intravenous contrast. CONTRAST:  121mL OMNIPAQUE IOHEXOL 300 MG/ML  SOLN COMPARISON:  10/24/2015 FINDINGS: Lower chest: Very marked circumferential wall thickening noted in the lower esophagus (image 4/series 2). Hepatobiliary: No suspicious focal abnormality within the liver parenchyma. Gallbladder decompressed. Intra and extrahepatic biliary duct dilatation evident with extrahepatic common duct measuring up to 12 mm diameter. Dilated common bile duct tapers into the ampulla. Pancreas:  Pancreatic parenchymal atrophy without substantial dilatation of the main duct. Spleen: No splenomegaly. No focal mass lesion. Adrenals/Urinary Tract: No adrenal nodule or mass. Kidneys unremarkable. No evidence for hydroureter. The urinary bladder appears normal for the degree of distention. Stomach/Bowel: Wall thickening noted at the esophagogastric junction. Mild wall thickening noted in the gastric antrum near the pylorus with a probable defect in the anterior wall (best seen on delayed image 25 of series 7). Undiluted high density oral contrast material is seen along the anterior wall the stomach and posterior capsule of the lateral segment left liver. The free, extraluminal contrast tracks up along the falciform ligament. Small bowel wall thickening is noted in the left abdomen. No evidence for small bowel  obstruction. Concern for wall thickening noted in the cecum (64/2). Remaining colonic segments are unremarkable. Vascular/Lymphatic: There is abdominal aortic atherosclerosis without aneurysm. There is no gastrohepatic or hepatoduodenal ligament lymphadenopathy. No intraperitoneal or retroperitoneal lymphadenopathy. No pelvic sidewall lymphadenopathy. Reproductive: The uterus is unremarkable.  There is no adnexal mass. Other: Large volume mixed attenuation free fluid in the abdomen is associated with intraperitoneal free air. There is high attenuation undiluted contrast material between the stomach and the left liver. Moderate attenuation fluid is seen around the liver and spleen as well as in the mesentery. Mixed attenuation fluid is noted in the pelvis. Musculoskeletal: No worrisome lytic or sclerotic osseous abnormality. IMPRESSION: 1. Intraperitoneal free air with moderate to large volume intraperitoneal free fluid of mixed attenuation. Very high attenuation fluid between the stomach and left liver suggests undiluted oral contrast material. Moderate attenuation fluid around the liver and spleen, in the central mesentery, in the upper para colic gutters is compatible with more dilute contrast material. Mixed attenuation fluid in the pelvis is compatible with ascites mixed with contrast. Given the associated free air, very high density of the free fluid in the upper abdomen and volume of fluid in the peritoneal cavity, findings are not felt to be related to extravasated intravenous contrast/hemorrhage. 2. Etiology of the above findings appears to be a defect in the anterior wall of the gastric antrum just proximal to the pylorus, suggesting perforated ulcer. 3. Marked circumferential thickening in the lower esophagus extending into the esophagogastric junction. This is more prominent than typically seen for esophagitis and is concerning for esophageal neoplasm. 4. Apparent wall thickening in the cecum also raises  concern for neoplasm although this may be secondary. 5. Fairly diffuse circumferential wall thickening in nondilated small bowel loops likely secondary. 6. Intra and extrahepatic biliary duct dilatation similar to 10/24/2015. Critical Value/emergent results were called by telephone at the time of interpretation on 10/16/2018 at 5:58 pm to [facility] who verbally acknowledged these results. Electronically Signed   By: Misty Stanley M.D.   On: 10/16/2018 17:58    ____________________________________________    PROCEDURES  Procedure(s) performed:    .Central Line Date/Time: 10/16/2018 6:30 PM Performed by: Nena Polio, MD Authorized by: Darletta Moll, PA-C   Consent:    Consent obtained:  Verbal and emergent situation   Consent given by:  Patient   Risks discussed:  Bleeding and incorrect placement Pre-procedure details:    Hand hygiene: Hand hygiene performed prior to insertion     Sterile barrier technique: All elements of maximal sterile technique followed     Skin preparation:  Betadine   Skin preparation agent: Skin preparation agent completely dried prior to procedure   Anesthesia (see MAR for exact dosages):    Anesthesia method:  Local infiltration   Local anesthetic:  Lidocaine 1% WITH epi Procedure details:    Location:  R femoral   Patient position:  Flat   Procedural supplies:  Triple lumen   Catheter size:  7 Fr   Landmarks identified: yes     Ultrasound guidance: yes     Number of attempts:  1   Successful placement: yes   Post-procedure details:    Post-procedure:  Dressing applied and line sutured   Assessment:  Blood return through all ports and free fluid flow   Patient tolerance of procedure:  Tolerated well, no immediate complications      Medications  norepinephrine (LEVOPHED) 4mg  in 265mL premix infusion (20 mcg/min Intravenous Rate/Dose Change 10/16/18 2103)  sodium chloride 0.9 % bolus 1,000 mL (0 mLs Intravenous Stopped  10/16/18 1808)  ondansetron (ZOFRAN) injection 4 mg (4 mg Intravenous Given 10/16/18 1536)  iohexol (OMNIPAQUE) 240 MG/ML injection 50 mL (50 mLs Oral Contrast Given 10/16/18 1524)  morphine 2 MG/ML injection 2 mg (2 mg Intravenous Given 10/16/18 2032)  iohexol (OMNIPAQUE) 300 MG/ML solution 100 mL (100 mLs Intravenous Contrast Given 10/16/18 1721)  sodium chloride 0.9 % bolus 1,000 mL (0 mLs Intravenous Stopped 10/16/18 2039)  piperacillin-tazobactam (ZOSYN) IVPB 3.375 g (0 g Intravenous Stopped 10/16/18 1956)  sodium chloride 0.9 % bolus 1,000 mL (1,000 mLs Intravenous New Bag/Given 10/16/18 2046)     ____________________________________________   INITIAL IMPRESSION / ASSESSMENT AND PLAN / ED COURSE  Pertinent labs & imaging results that were available during my care of the patient were reviewed by me and considered in my medical decision making (see chart for details).  Review of the [place] CSRS was performed in accordance of the [place] prior to dispensing any controlled drugs.  Clinical Course as of Oct 15 2108  Tue Oct 16, 2018  1526 Patient presented to the emergency department complaining of left-sided abdominal pain.  Patient was slightly hypotensive on arrival.  Patient states that pain began this morning.  She has associated nausea but no other complaints.  No urinary complaints.  No history of chronic GI issues.  Differential at this time includes gastroenteritis, diverticulitis, bowel perforation, bowel obstruction, AAA, dissection.   [JC]    Clinical Course User Index [JC] , Charline Bills, PA-C          Patient's diagnosis is consistent with gastric perforation.  Patient presented to the emergency department complaining of sharp left-sided abdominal pain.  Patient reports that pain began suddenly today.  She states that she felt a "pop" and had worsening abdominal pain throughout the day.  Patient was hypotensive with EMS.  On arrival to the emergency department.  Patient  continued to endorse left sided abdominal pain.  She was mildly hypotensive at this point.  After IV fluids, patient's blood pressure improved to 120/70.  CT scan reveals what appears to be perforation of the gastric antrum as well as probable malignancy in the distal esophagus and cecum area.  Given patient's persistent hypotension, poor IV access, central line was placed in the right femoral vein.  Patient tolerated well.  Patient was placed on Levophed as patient's blood pressure remained hypertensive even with fluid.  Patient was also given dose of Zosyn.  I discussed the case with our surgical team, given patient's new findings of malignancy, they do not feel comfortable operating on the patient.  They recommended tertiary [facility].  I discussed the case with [facility] and they are agreeable for transfer.  Patient  is currently stable at this time with IV fluids, pressors.  Patient will be transferred via [facility] air care to [facility] for surgical repair of perforation.    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 Despite best efforts to proofread, errors can occur which can change the meaning. Any change was purely unintentional.    Darletta Moll, PA-C 10/16/18 2110    Nena Polio, MD 10/17/18 [phone]

## 2018-10-16 NOTE — ED Notes (Signed)
Dr. Cinda Quest at bedside to start central line.

## 2018-10-16 NOTE — ED Triage Notes (Signed)
Pt presents to ED via [facility] with c/o LLQ abdominal pain x 2 days, states woke up this AM with sudden worsening at approx 0900. Per EMS normal stools, normal urine, pain worsens with movement. EMS reports BP 76/50, 18G to L AC, 500CC bolus initiated PTA with pt receiving approx 50cc's.   CBG 176 BP 76/50 HR 72

## 2018-10-16 NOTE — ED Notes (Signed)
Lab called regarding patient being a difficult stick, per PA, asked lab to come and draw type and screen. This RN and PA attempted EJ without success.

## 2018-10-23 DIAGNOSIS — R410 Disorientation, unspecified: Secondary | ICD-10-CM | POA: Insufficient documentation

## 2018-11-16 ENCOUNTER — Inpatient Hospital Stay
Admission: EM | Admit: 2018-11-16 | Discharge: 2018-11-18 | DRG: 641 | Disposition: A | Discharge status: Home / Self Care | Payer: Medicaid Other | Attending: Internal Medicine | Admitting: Internal Medicine

## 2018-11-16 ENCOUNTER — Encounter: Payer: Self-pay | Admitting: Emergency Medicine

## 2018-11-16 DIAGNOSIS — M109 Gout, unspecified: Secondary | ICD-10-CM | POA: Diagnosis present

## 2018-11-16 DIAGNOSIS — E119 Type 2 diabetes mellitus without complications: Secondary | ICD-10-CM | POA: Diagnosis present

## 2018-11-16 DIAGNOSIS — K219 Gastro-esophageal reflux disease without esophagitis: Secondary | ICD-10-CM | POA: Diagnosis present

## 2018-11-16 DIAGNOSIS — E871 Hypo-osmolality and hyponatremia: Secondary | ICD-10-CM | POA: Diagnosis not present

## 2018-11-16 DIAGNOSIS — Z8249 Family history of ischemic heart disease and other diseases of the circulatory system: Secondary | ICD-10-CM

## 2018-11-16 DIAGNOSIS — D638 Anemia in other chronic diseases classified elsewhere: Secondary | ICD-10-CM | POA: Diagnosis present

## 2018-11-16 DIAGNOSIS — Z8711 Personal history of peptic ulcer disease: Secondary | ICD-10-CM | POA: Diagnosis not present

## 2018-11-16 DIAGNOSIS — F17210 Nicotine dependence, cigarettes, uncomplicated: Secondary | ICD-10-CM | POA: Diagnosis present

## 2018-11-16 DIAGNOSIS — E861 Hypovolemia: Secondary | ICD-10-CM | POA: Diagnosis present

## 2018-11-16 DIAGNOSIS — I10 Essential (primary) hypertension: Secondary | ICD-10-CM | POA: Diagnosis present

## 2018-11-16 DIAGNOSIS — Z833 Family history of diabetes mellitus: Secondary | ICD-10-CM | POA: Diagnosis not present

## 2018-11-16 DIAGNOSIS — Z1159 Encounter for screening for other viral diseases: Secondary | ICD-10-CM

## 2018-11-16 DIAGNOSIS — M1990 Unspecified osteoarthritis, unspecified site: Secondary | ICD-10-CM | POA: Diagnosis present

## 2018-11-16 HISTORY — DX: Hypo-osmolality and hyponatremia: E87.1

## 2018-11-16 LAB — PROTIME-INR
INR: 1.3 — ABNORMAL HIGH (ref 0.8–1.2)
Prothrombin Time: 15.7 seconds — ABNORMAL HIGH (ref 11.4–15.2)

## 2018-11-16 LAB — COMPREHENSIVE METABOLIC PANEL
ALT: 10 U/L (ref 0–44)
AST: 34 U/L (ref 15–41)
Albumin: 2.7 g/dL — ABNORMAL LOW (ref 3.5–5.0)
Alkaline Phosphatase: 114 U/L (ref 38–126)
Anion gap: 11 (ref 5–15)
BUN: <5 mg/dL — ABNORMAL LOW (ref 8–23)
CO2: 20 mmol/L — ABNORMAL LOW (ref 22–32)
Calcium: 8.1 mg/dL — ABNORMAL LOW (ref 8.9–10.3)
Chloride: 87 mmol/L — ABNORMAL LOW (ref 98–111)
Creatinine, Ser: 0.67 mg/dL (ref 0.44–1.00)
GFR calc Af Amer: >60 mL/min (ref >60)
GFR calc non Af Amer: >60 mL/min (ref >60)
Glucose, Bld: 104 mg/dL — ABNORMAL HIGH (ref 70–99)
Potassium: 4.1 mmol/L (ref 3.5–5.1)
Sodium: 118 mmol/L — CL (ref 135–145)
Total Bilirubin: 0.6 mg/dL (ref 0.3–1.2)
Total Protein: 7.2 g/dL (ref 6.5–8.1)

## 2018-11-16 LAB — CBC
HCT: 23.8 % — ABNORMAL LOW (ref 36.0–46.0)
Hemoglobin: 7.7 g/dL — ABNORMAL LOW (ref 12.0–15.0)
MCH: 25 pg — ABNORMAL LOW (ref 26.0–34.0)
MCHC: 32.4 g/dL (ref 30.0–36.0)
MCV: 77.3 fL — ABNORMAL LOW (ref 80.0–100.0)
Platelets: 376 10*3/uL (ref 150–400)
RBC: 3.08 MIL/uL — ABNORMAL LOW (ref 3.87–5.11)
RDW: 17.2 % — ABNORMAL HIGH (ref 11.5–15.5)
WBC: 6.3 10*3/uL (ref 4.0–10.5)
nRBC: 0 % (ref 0.0–0.2)

## 2018-11-16 LAB — TYPE AND SCREEN
ABO/RH(D): A POS
Antibody Screen: NEGATIVE

## 2018-11-16 LAB — APTT: aPTT: 33 seconds (ref 24–36)

## 2018-11-16 LAB — SARS CORONAVIRUS 2 BY RT PCR (HOSPITAL ORDER, PERFORMED IN ~~LOC~~ HOSPITAL LAB): SARS Coronavirus 2: NEGATIVE

## 2018-11-16 MED ORDER — SENNOSIDES-DOCUSATE SODIUM 8.6-50 MG PO TABS: 1.0000 | ORAL_TABLET | Freq: Every evening | ORAL | Status: DC | PRN

## 2018-11-16 MED ORDER — BISACODYL 5 MG PO TBEC: 5.0000 mg | DELAYED_RELEASE_TABLET | Freq: Every day | ORAL | Status: DC | PRN

## 2018-11-16 MED ORDER — ONDANSETRON HCL 4 MG/2ML IJ SOLN: 4.0000 mg | Freq: Four times a day (QID) | INTRAMUSCULAR | Status: DC | PRN

## 2018-11-16 MED ORDER — INSULIN ASPART 100 UNIT/ML ~~LOC~~ SOLN
0.0000 [IU] | Freq: Three times a day (TID) | SUBCUTANEOUS | Status: DC
  Administered 2018-11-17 – 2018-11-18 (×2): 2 [IU] via SUBCUTANEOUS
  Filled 2018-11-16 (×2): qty 1

## 2018-11-16 MED ORDER — ALBUTEROL SULFATE (2.5 MG/3ML) 0.083% IN NEBU: 2.5000 mg | INHALATION_SOLUTION | RESPIRATORY_TRACT | Status: DC | PRN

## 2018-11-16 MED ORDER — ACETAMINOPHEN 325 MG PO TABS: 650.0000 mg | ORAL_TABLET | Freq: Four times a day (QID) | ORAL | Status: DC | PRN

## 2018-11-16 MED ORDER — ACETAMINOPHEN 650 MG RE SUPP: 650.0000 mg | Freq: Four times a day (QID) | RECTAL | Status: DC | PRN

## 2018-11-16 MED ORDER — ENOXAPARIN SODIUM 40 MG/0.4ML ~~LOC~~ SOLN
40.0000 mg | SUBCUTANEOUS | Status: DC
  Administered 2018-11-17 – 2018-11-18 (×2): 40 mg via SUBCUTANEOUS
  Filled 2018-11-16 (×2): qty 0.4

## 2018-11-16 MED ORDER — PANTOPRAZOLE SODIUM 40 MG PO TBEC
40.0000 mg | DELAYED_RELEASE_TABLET | Freq: Two times a day (BID) | ORAL | Status: DC
  Administered 2018-11-17 – 2018-11-18 (×3): 40 mg via ORAL
  Filled 2018-11-16 (×3): qty 1

## 2018-11-16 MED ORDER — SODIUM CHLORIDE 0.9 % IV SOLN
INTRAVENOUS | Status: DC
  Administered 2018-11-17 – 2018-11-18 (×3): via INTRAVENOUS

## 2018-11-16 MED ORDER — INSULIN ASPART 100 UNIT/ML ~~LOC~~ SOLN: 0.0000 [IU] | Freq: Every day | SUBCUTANEOUS | Status: DC

## 2018-11-16 MED ORDER — ONDANSETRON HCL 4 MG PO TABS: 4.0000 mg | ORAL_TABLET | Freq: Four times a day (QID) | ORAL | Status: DC | PRN

## 2018-11-16 NOTE — ED Notes (Signed)
Lab will send phlebotomist for blood work

## 2018-11-16 NOTE — H&P (Addendum)
[place] [facility] NAME: Donna Aguirre    MR#:  [id number]  DATE OF BIRTH:  05-24-1956  DATE OF ADMISSION:  11/16/2018  PRIMARY CARE PHYSICIAN: Patient, No Pcp Per   REQUESTING/REFERRING PHYSICIAN: Dr. Corky Downs.  CHIEF COMPLAINT:   Chief Complaint  Patient presents with  . Abnormal Lab   Hyponatremia. HISTORY OF PRESENT ILLNESS:  Donna Aguirre  is a 63 y.o. female with a known history of perforated multiple gastric ulcer, hypertension, diabetes, gout, arthritis.  The patient saw her PCP for routine follow-up.  Then she was called by her PCP due to low sodium.  The patient denies any weakness, dizziness or cramps.  She denies any other symptoms.  She had abdominal surgery due to perforated gastric ulcer [facility] hospital recently.  She denies any melena or bloody stool.  Her hemoglobin is 8.6 yesterday.  The patient said she has had bilateral leg swelling after surgery, which has been improving.  Dr. Corky Downs requests admission. PAST MEDICAL HISTORY:   Past Medical History:  Diagnosis Date  . Arthritis   . Diabetes mellitus without complication ([place])   . Gout   . Hypertension     PAST SURGICAL HISTORY:  History reviewed. No pertinent surgical history. PR EXPLORATORY OF ABDOMEN, PR REPAIR PERF DUOD/GAST ULC-WND/INJ, PR EXPLOR LIVER WOUND,EXTENSIV DEBRIDE, PR OMENTAL FLAP,INTRA-ABDOMINAL  SOCIAL HISTORY:   Social History   Tobacco Use  . Smoking status: Current Every Day Smoker    Types: Cigarettes  . Smokeless tobacco: Current User  Substance Use Topics  . Alcohol use: Yes    Comment: States she drinks a little and sometimes a lot.    FAMILY HISTORY:  History reviewed. No pertinent family history.  Hypertension diabetes.  DRUG ALLERGIES:  No Known Allergies  REVIEW OF SYSTEMS:   Review of Systems  Constitutional: Negative for chills, fever and malaise/fatigue.  HENT: Negative for sore throat.   Eyes: Negative for blurred  vision and double vision.  Respiratory: Negative for cough, hemoptysis, shortness of breath, wheezing and stridor.   Cardiovascular: Negative for chest pain, palpitations, orthopnea and leg swelling.  Gastrointestinal: Negative for abdominal pain, blood in stool, diarrhea, melena, nausea and vomiting.  Genitourinary: Negative for dysuria, flank pain and hematuria.  Musculoskeletal: Negative for back pain and joint pain.  Skin: Negative for rash.  Neurological: Negative for dizziness, sensory change, focal weakness, seizures, loss of consciousness, weakness and headaches.  Endo/Heme/Allergies: Negative for polydipsia.  Psychiatric/Behavioral: Negative for depression. The patient is not nervous/anxious.     MEDICATIONS AT HOME:   Prior to Admission medications   Not on File      VITAL SIGNS:  Blood pressure 131/67, pulse 91, temperature 99.2 F (37.3 C), temperature source Oral, resp. rate (!) 8, height 5\' 5"  (1.651 m), weight 77.1 kg, SpO2 100 %.  PHYSICAL EXAMINATION:  Physical Exam  GENERAL:  63 y.o.-year-old patient lying in the bed with no acute distress.  EYES: Pupils equal, round, reactive to light and accommodation. No scleral icterus. Extraocular muscles intact.  HEENT: Head atraumatic, normocephalic. Oropharynx and nasopharynx clear.  NECK:  Supple, no jugular venous distention. No thyroid enlargement, no tenderness.  LUNGS: Normal breath sounds bilaterally, no wheezing, rales,rhonchi or crepitation. No use of accessory muscles of respiration.  CARDIOVASCULAR: S1, S2 normal. No murmurs, rubs, or gallops.  ABDOMEN: Soft, nontender, nondistended. Bowel sounds present. No organomegaly or mass.  EXTREMITIES: No cyanosis, or clubbing.  Bilateral leg edema 2+. NEUROLOGIC:  Cranial nerves II through XII are intact. Muscle strength 5/5 in all extremities. Sensation intact. Gait not checked.  PSYCHIATRIC: The patient is alert and oriented x 3.  SKIN: No obvious rash, lesion, or  ulcer.   LABORATORY PANEL:   CBC Recent Labs  Lab 11/16/18 1741  WBC 6.3  HGB 7.7*  HCT 23.8*  PLT 376   ------------------------------------------------------------------------------------------------------------------  Chemistries  Recent Labs  Lab 11/16/18 1741  NA 118*  K 4.1  CL 87*  CO2 20*  GLUCOSE 104*  BUN <5*  CREATININE 0.67  CALCIUM 8.1*  AST 34  ALT 10  ALKPHOS 114  BILITOT 0.6   ------------------------------------------------------------------------------------------------------------------  Cardiac Enzymes No results for input(s): TROPONINI in the last 168 hours. ------------------------------------------------------------------------------------------------------------------  RADIOLOGY:  No results found.    IMPRESSION AND PLAN:   Severe hyponatremia. The patient will be admitted to medical floor. Start normal saline IV, follow-up sodium level, nephrology consult.  Anemia of chronic disease.  Stable.  Recent perforated gastric ulcer.  Protonix twice daily.  Hypertension.  Continue home hypertension medication. Diabetes.  Start sliding scale. Tobacco abuse.  Smoking cessation was counseled for 3 to 4 minutes. Medication reconciliation is not done yet.  No medication is listed.  Please follow-up and resume home medication. All the records are reviewed and case discussed with ED provider. Management plans discussed with the patient, family and they are in agreement.  CODE STATUS: Full code.  TOTAL TIME TAKING CARE OF THIS PATIENT: 46 minutes.    Demetrios Loll M.D on 11/16/2018 at 9:04 PM  Between 7am to 6pm - Pager - [phone]  After 6pm go to [email or web address] - Proofreader  [facility]  Office  [phone]  CC: Primary care physician; Patient, No Pcp Per   Note: This dictation was prepared with Dragon dictation along with smaller phrase technology. Any transcriptional errors that result from this  process are unin

## 2018-11-16 NOTE — ED Notes (Signed)
Patient states she does not need me to call any family

## 2018-11-16 NOTE — Progress Notes (Signed)
Advanced Care Plan.  Purpose of Encounter: CODE STATUS. Parties in Attendance: The patient and me. Patient's Decisional Capacity: Yes. Medical Story: Donna Aguirre  is a 63 y.o. female with a known history of perforated multiple gastric ulcer, anemia, hypertension, diabetes, gout, arthritis.  The patient is being admitted for hyponatremia.  I discussed with patient about her current condition, prognosis and CODE STATUS.  The patient wants to be resuscitated and intubated if she has cardiopulmonary arrest. Plan:  Code Status: Full code.  Time spent discussing advance care planning: 17 minutes.

## 2018-11-16 NOTE — ED Provider Notes (Signed)
[facility] Emergency Department Provider Note   ____________________________________________    I have reviewed the triage vital signs and the nursing notes.   HISTORY  Chief Complaint Abnormal Lab     HPI Donna Aguirre is a 63 y.o. female who who was sent to the emergency department for evaluation by her GI doctor for abnormal lab.  Patient has significant past medical history of perforated gastric ulcer, repaired at [facility] over a month ago, has been healing well and doing quite well.  She has no abdominal pain today.  Went for routine follow-up with GI, they check labs and found her sodium to be quite low and recommended evaluation in the emergency department.  Patient says she feels generally well overall.  Denies vomiting or black stools  Past Medical History:  Diagnosis Date  . Arthritis   . Diabetes mellitus without complication ([place])   . Gout   . Hypertension     Patient Active Problem List   Diagnosis Date Noted  . Hyponatremia 11/16/2018    History reviewed. No pertinent surgical history.  Prior to Admission medications   Not on File     Allergies Patient has no known allergies.  History reviewed. No pertinent family history.  Social History Social History   Tobacco Use  . Smoking status: Current Every Day Smoker    Types: Cigarettes  . Smokeless tobacco: Current User  Substance Use Topics  . Alcohol use: Yes    Comment: States she drinks a little and sometimes a lot.  . Drug use: No    Review of Systems  Constitutional: No fever/chills Eyes: No visual changes.  ENT: No sore throat. Cardiovascular: Denies chest pain. Respiratory: Denies shortness of breath. Gastrointestinal: No abdominal pain.   Genitourinary: Negative for dysuria. Musculoskeletal: Negative for back pain. Skin: Negative for rash. Neurological: Negative for headaches or weakness   ____________________________________________   PHYSICAL  EXAM:  VITAL SIGNS: ED Triage Vitals  Enc Vitals Group     BP 11/16/18 1731 128/62     Pulse Rate 11/16/18 1731 93     Resp 11/16/18 1731 20     Temp 11/16/18 1731 99.2 F (37.3 C)     Temp Source 11/16/18 1731 Oral     SpO2 11/16/18 1731 100 %     Weight 11/16/18 1732 77.1 kg (170 lb)     Height 11/16/18 1732 1.651 m (5\' 5" )     Head Circumference --      Peak Flow --      Pain Score 11/16/18 1739 0     Pain Loc --      Pain Edu? --      Excl. in [place]? --     Constitutional: Alert and oriented. Eyes: Conjunctivae are normal.   Nose: No congestion/rhinnorhea. Mouth/Throat: Mucous membranes are moist.    Cardiovascular: Normal rate, regular rhythm. Grossly normal heart sounds.  Good peripheral circulation. Respiratory: Normal respiratory effort.  No retractions. Lungs CTAB. Abdomen: No abdominal tenderness to palpation, no distention Musculoskeletal: No lower extremity tenderness nor edema.  Warm and well perfused Neurologic:  Normal speech and language. No gross focal neurologic deficits are appreciated.  Skin:  Skin is warm, dry and intact. No rash noted. Psychiatric: Mood and affect are normal. Speech and behavior are normal.  ____________________________________________   LABS (all labs ordered are listed, but only abnormal results are displayed)  Labs Reviewed  CBC - Abnormal; Notable for the following components:  Result Value   RBC 3.08 (*)    Hemoglobin 7.7 (*)    HCT 23.8 (*)    MCV 77.3 (*)    MCH 25.0 (*)    RDW 17.2 (*)    All other components within normal limits  COMPREHENSIVE METABOLIC PANEL - Abnormal; Notable for the following components:   Sodium 118 (*)    Chloride 87 (*)    CO2 20 (*)    Glucose, Bld 104 (*)    BUN <5 (*)    Calcium 8.1 (*)    Albumin 2.7 (*)    All other components within normal limits  PROTIME-INR - Abnormal; Notable for the following components:   Prothrombin Time 15.7 (*)    INR 1.3 (*)    All other components  within normal limits  SARS CORONAVIRUS 2 (HOSPITAL ORDER, [place] [facility])  APTT  TYPE AND SCREEN   ____________________________________________  EKG  None ____________________________________________  RADIOLOGY  None ____________________________________________   PROCEDURES  Procedure(s) performed: No  Procedures   Critical Care performed: yes  CRITICAL CARE Performed by: Lavonia Drafts   Total critical care time: 30 minutes  Critical care time was exclusive of separately billable procedures and treating other patients.  Critical care was necessary to treat or prevent imminent or life-threatening deterioration.  Critical care was time spent personally by me on the following activities: development of treatment plan with patient and/or surrogate as well as nursing, discussions with consultants, evaluation of patient's response to treatment, examination of patient, obtaining history from patient or surrogate, ordering and performing treatments and interventions, ordering and review of laboratory studies, ordering and review of radiographic studies, pulse oximetry and re-evaluation of patient's condition.  ____________________________________________   INITIAL IMPRESSION / ASSESSMENT AND PLAN / ED COURSE  Pertinent labs & imaging results that were available during my care of the patient were reviewed by me and considered in my medical decision making (see chart for details).  Patient presents with abnormal lab value, critically low sodium confirmed here in the emergency department of 118.  She also is anemic, review of care everywhere demonstrates that her hemoglobin has been in the range of 7.5-8.5 over the last month so this does not appear to be acute.  She will certainly require admission to the hospitalist service, we will observe her carefully for altered mental status or seizure-like activity    ____________________________________________    FINAL CLINICAL IMPRESSION(S) / ED DIAGNOSES  Final diagnoses:  Acute hyponatremia        Note:  This document was prepared using Dragon voice recognition software and may include unintentional dictation errors.   Lavonia Drafts, MD 11/16/18 2239

## 2018-11-16 NOTE — ED Notes (Signed)
IV team unable to get blood. Will consult phlebotomy

## 2018-11-16 NOTE — ED Triage Notes (Signed)
Pt to ED after PCP notified her to come in due to abnormal. Pt unable to tell RN which lab is abnormal.

## 2018-11-16 NOTE — ED Notes (Signed)
ED TO INPATIENT HANDOFF REPORT  ED Nurse Name and Phone #: [place] Levels Name/Age/Gender Donna Aguirre 63 y.o. female Room/Bed: ED05A/ED05A  Code Status   Code Status: Not on file  Home/SNF/Other Home Patient oriented to: self, place, time and situation Is this baseline? Yes   Triage Complete: Triage complete  Chief Complaint confused  Triage Note Pt to ED after PCP notified her to come in due to abnormal. Pt unable to tell RN which lab is abnormal.    Allergies No Known Allergies  Level of Care/Admitting Diagnosis ED Disposition    ED Disposition Condition [place]: [place] [[id number]]  Level of Care: Med-Surg [16]  Covid Evaluation: Screening Protocol (No Symptoms)  Diagnosis: Hyponatremia [161096]  Admitting Physician: Demetrios Loll [[id number]]  Attending Physician: Demetrios Loll [[id number]]  Estimated length of stay: past midnight tomorrow  Certification:: I certify this patient will need inpatient services for at least 2 midnights  PT Class (Do Not Modify): Inpatient [101]  PT Acc Code (Do Not Modify): Private [1]       B Medical/Surgery History Past Medical History:  Diagnosis Date  . Arthritis   . Diabetes mellitus without complication ([place])   . Gout   . Hypertension    History reviewed. No pertinent surgical history.   A IV Location/Drains/Wounds Patient Lines/Drains/Airways Status   Active Line/Drains/Airways    Name:   Placement date:   Placement time:   Site:   Days:   Peripheral IV 10/16/18 Left Forearm   10/16/18    1528    Forearm   31   Peripheral IV 10/16/18 Right Forearm   10/16/18    1540    Forearm   31   Peripheral IV 11/16/18 Left;Posterior Forearm   11/16/18    2110    Forearm   less than 1   CVC Triple Lumen 10/16/18 Right Femoral 20 cm 5 cm   10/16/18    1904     31          Intake/Output Last 24 hours No intake or output data in the 24 hours ending 11/16/18 2328  Labs/Imaging Results for  orders placed or performed during the hospital encounter of 11/16/18 (from the past 48 hour(s))  CBC     Status: Abnormal   Collection Time: 11/16/18  5:41 PM  Result Value Ref Range   WBC 6.3 4.0 - 10.5 K/uL   RBC 3.08 (L) 3.87 - 5.11 MIL/uL   Hemoglobin 7.7 (L) 12.0 - 15.0 g/dL    Comment: Reticulocyte Hemoglobin testing may be clinically indicated, consider ordering this additional test [id number]    HCT 23.8 (L) 36.0 - 46.0 %   MCV 77.3 (L) 80.0 - 100.0 fL   MCH 25.0 (L) 26.0 - 34.0 pg   MCHC 32.4 30.0 - 36.0 g/dL   RDW 17.2 (H) 11.5 - 15.5 %   Platelets 376 150 - 400 K/uL   nRBC 0.0 0.0 - 0.2 %    Comment: Performed at [facility], [address] [place], [place] [address]  Comprehensive metabolic panel     Status: Abnormal   Collection Time: 11/16/18  5:41 PM  Result Value Ref Range   Sodium 118 (LL) 135 - 145 mmol/L    Comment: CRITICAL RESULT CALLED TO, READ BACK BY AND VERIFIED WITH ANGELA ROBBINS 11/16/18 @ 1816  MLK    Potassium 4.1 3.5 - 5.1 mmol/L    Comment: HEMOLYSIS  AT THIS LEVEL MAY AFFECT RESULT   Chloride 87 (L) 98 - 111 mmol/L   CO2 20 (L) 22 - 32 mmol/L   Glucose, Bld 104 (H) 70 - 99 mg/dL   BUN <5 (L) 8 - 23 mg/dL   Creatinine, Ser 0.67 0.44 - 1.00 mg/dL   Calcium 8.1 (L) 8.9 - 10.3 mg/dL   Total Protein 7.2 6.5 - 8.1 g/dL   Albumin 2.7 (L) 3.5 - 5.0 g/dL   AST 34 15 - 41 U/L   ALT 10 0 - 44 U/L   Alkaline Phosphatase 114 38 - 126 U/L   Total Bilirubin 0.6 0.3 - 1.2 mg/dL   GFR calc non Af Amer >60 >60 mL/min   GFR calc Af Amer >60 >60 mL/min   Anion gap 11 5 - 15    Comment: Performed at [facility], [address], [place], [place] [address]  SARS Coronavirus 2 (CEPHEID - Performed in [place] hospital lab), Hosp Order     Status: None   Collection Time: 11/16/18  9:03 PM   Specimen: Nasopharyngeal Swab  Result Value Ref Range   SARS Coronavirus 2 NEGATIVE NEGATIVE    Comment: (NOTE) If result is NEGATIVE SARS-CoV-2  target nucleic acids are NOT DETECTED. The SARS-CoV-2 RNA is generally detectable in upper and lower  respiratory specimens during the acute phase of infection. The lowest  concentration of SARS-CoV-2 viral copies this assay can detect is 250  copies / mL. A negative result does not preclude SARS-CoV-2 infection  and should not be used as the sole basis for treatment or other  patient management decisions.  A negative result may occur with  improper specimen collection / handling, submission of specimen other  than nasopharyngeal swab, presence of viral mutation(s) within the  areas targeted by this assay, and inadequate number of viral copies  (<250 copies / mL). A negative result must be combined with clinical  observations, patient history, and epidemiological information. If result is POSITIVE SARS-CoV-2 target nucleic acids are DETECTED. The SARS-CoV-2 RNA is generally detectable in upper and lower  respiratory specimens dur ing the acute phase of infection.  Positive  results are indicative of active infection with SARS-CoV-2.  Clinical  correlation with patient history and other diagnostic information is  necessary to determine patient infection status.  Positive results do  not rule out bacterial infection or co-infection with other viruses. If result is PRESUMPTIVE POSTIVE SARS-CoV-2 nucleic acids MAY BE PRESENT.   A presumptive positive result was obtained on the submitted specimen  and confirmed on repeat testing.  While 2019 novel coronavirus  (SARS-CoV-2) nucleic acids may be present in the submitted sample  additional confirmatory testing may be necessary for epidemiological  and / or clinical management purposes  to differentiate between  SARS-CoV-2 and other Sarbecovirus currently known to infect humans.  If clinically indicated additional testing with an alternate test  methodology [phone]) is advised. The SARS-CoV-2 RNA is generally  detectable in upper and lower  respiratory sp ecimens during the acute  phase of infection. The expected result is Negative. Fact Sheet for Patients:  StrictlyIdeas.no Fact Sheet for Healthcare Providers: BankingDealers.co.za This test is not yet approved or cleared by the [place] FDA and has been authorized for detection and/or diagnosis of SARS-CoV-2 by FDA under an Emergency Use Authorization (EUA).  This EUA will remain in effect (meaning this test can be used) for the duration of the COVID-19 declaration under Section 564(b)(1) of the  Act, 21 U.S.C. section 360bbb-3(b)(1), unless the authorization is terminated or revoked sooner. Performed at [facility], [address] [place], [place] [address]   Type and screen [place]     Status: None   Collection Time: 11/16/18  9:26 PM  Result Value Ref Range   ABO/RH(D) A POS    Antibody Screen NEG    Sample Expiration      11/19/2018,2359 Performed at [facility], [address] [place], [place] [address]   APTT     Status: None   Collection Time: 11/16/18  9:26 PM  Result Value Ref Range   aPTT 33 24 - 36 seconds    Comment: Performed at [facility], [address] [place], [place] [address]  Protime-INR     Status: Abnormal   Collection Time: 11/16/18  9:26 PM  Result Value Ref Range   Prothrombin Time 15.7 (H) 11.4 - 15.2 seconds   INR 1.3 (H) 0.8 - 1.2    Comment: (NOTE) INR goal varies based on device and disease states. Performed at [facility], [address] [place],  [address]    No results found.  Pending Labs [employer] (From admission, onward)    Start     Ordered   Signed and Held  HIV antibody (Routine Testing)  Once,   R     Signed and Held   Signed and Occupational hygienist morning,   R     Signed and Held   Signed and Held  CBC  Tomorrow morning,   R     Signed and Held   Signed  and Held  Creatinine, serum  (enoxaparin (LOVENOX)    CrCl >/= 30 ml/min)  Weekly,   R    Comments: while on enoxaparin therapy    Signed and Held          Vitals/Pain Today's Vitals   11/16/18 2200 11/16/18 2222 11/16/18 2230 11/16/18 2300  BP: 132/70  (!) 158/84 (!) 148/69  Pulse: 90  94 91  Resp: 11  19 (!) 21  Temp:      TempSrc:      SpO2: 99%  100% 100%  Weight:      Height:      PainSc:  0-No pain      Isolation Precautions No active isolations  Medications Medications - No data to display  Mobility walks with person assist Low fall risk   Focused Assessments Neuro Assessment Handoff:  Swallow screen pass? Yes          Neuro Assessment: Within Defined Limits Neuro Checks:      Last Documented NIHSS Modified Score:   Has TPA been given? No If patient is a Neuro Trauma and patient is going to OR before floor call report to [place] nurse: [phone] or [phone]     R Recommendations: See Admitting Provider Note  Report given to:   Additional Notes: not stroke

## 2018-11-17 ENCOUNTER — Other Ambulatory Visit: Payer: Self-pay

## 2018-11-17 LAB — CBC
HCT: 24.7 % — ABNORMAL LOW (ref 36.0–46.0)
Hemoglobin: 8 g/dL — ABNORMAL LOW (ref 12.0–15.0)
MCH: 25.1 pg — ABNORMAL LOW (ref 26.0–34.0)
MCHC: 32.4 g/dL (ref 30.0–36.0)
MCV: 77.4 fL — ABNORMAL LOW (ref 80.0–100.0)
Platelets: 294 10*3/uL (ref 150–400)
RBC: 3.19 MIL/uL — ABNORMAL LOW (ref 3.87–5.11)
RDW: 17.1 % — ABNORMAL HIGH (ref 11.5–15.5)
WBC: 5.1 10*3/uL (ref 4.0–10.5)
nRBC: 0 % (ref 0.0–0.2)

## 2018-11-17 LAB — BASIC METABOLIC PANEL
Anion gap: 8 (ref 5–15)
BUN: <5 mg/dL — ABNORMAL LOW (ref 8–23)
CO2: 23 mmol/L (ref 22–32)
Calcium: 8 mg/dL — ABNORMAL LOW (ref 8.9–10.3)
Chloride: 93 mmol/L — ABNORMAL LOW (ref 98–111)
Creatinine, Ser: 0.42 mg/dL — ABNORMAL LOW (ref 0.44–1.00)
GFR calc Af Amer: >60 mL/min (ref >60)
GFR calc non Af Amer: >60 mL/min (ref >60)
Glucose, Bld: 120 mg/dL — ABNORMAL HIGH (ref 70–99)
Potassium: 3.6 mmol/L (ref 3.5–5.1)
Sodium: 124 mmol/L — ABNORMAL LOW (ref 135–145)

## 2018-11-17 LAB — SODIUM, URINE, RANDOM: Sodium, Ur: 48 mmol/L

## 2018-11-17 LAB — GLUCOSE, CAPILLARY
Glucose-Capillary: 90 mg/dL (ref 70–99)
Glucose-Capillary: 78 mg/dL (ref 70–99)
Glucose-Capillary: 196 mg/dL — ABNORMAL HIGH (ref 70–99)
Glucose-Capillary: 146 mg/dL — ABNORMAL HIGH (ref 70–99)

## 2018-11-17 LAB — OSMOLALITY, URINE: Osmolality, Ur: 151 mOsm/kg — ABNORMAL LOW (ref 300–900)

## 2018-11-17 NOTE — Consult Note (Signed)
[facility]  CONSULT NOTE    Date: 11/17/2018                  Patient Name:  Donna Aguirre  MRN: [id number]  DOB: 1956/02/08  Age / Sex: 63 y.o., female         PCP: Patient, No Pcp Per                 Service Requesting Consult: Dr. Tressia Miners                 Reason for Consult: Hyponatremia            History of Present Illness: Donna Aguirre admitted for hyponatremia. Patient was recently treated for hyponatremia at [facility]. Diagnosis was tea and toast diet. Treated with IV fluids overnight   Medications: Outpatient medications: No medications prior to admission.    Current medications: Current Facility-Administered Medications  Medication Dose Route Frequency Provider Last Rate Last Dose  . 0.9 %  sodium chloride infusion   Intravenous Continuous Demetrios Loll, MD 75 mL/hr at 11/17/18 1419    . acetaminophen (TYLENOL) tablet 650 mg  650 mg Oral Q6H PRN Demetrios Loll, MD       Or  . acetaminophen (TYLENOL) suppository 650 mg  650 mg Rectal Q6H PRN Demetrios Loll, MD      . albuterol (PROVENTIL) (2.5 MG/3ML) 0.083% nebulizer solution 2.5 mg  2.5 mg Nebulization Q2H PRN Demetrios Loll, MD      . bisacodyl (DULCOLAX) EC tablet 5 mg  5 mg Oral Daily PRN Demetrios Loll, MD      . enoxaparin (LOVENOX) injection 40 mg  40 mg Subcutaneous Q24H Demetrios Loll, MD   40 mg at 11/17/18 0910  . insulin aspart (novoLOG) injection 0-5 Units  0-5 Units Subcutaneous QHS Demetrios Loll, MD      . insulin aspart (novoLOG) injection 0-9 Units  0-9 Units Subcutaneous TID WC Demetrios Loll, MD      . ondansetron [facility]) tablet 4 mg  4 mg Oral Q6H PRN Demetrios Loll, MD       Or  . ondansetron [facility]) injection 4 mg  4 mg Intravenous Q6H PRN Demetrios Loll, MD      . pantoprazole (PROTONIX) EC tablet 40 mg  40 mg Oral BID Otho Perl, MD   40 mg at 11/17/18 0910  . senna-docusate (Senokot-S) tablet 1 tablet  1 tablet Oral QHS PRN Demetrios Loll, MD          Allergies: No Known Allergies    Past  Medical History: Past Medical History:  Diagnosis Date  . Arthritis   . Diabetes mellitus without complication ([place])   . Gout   . Hypertension      Past Surgical History: History reviewed. No pertinent surgical history.   Family History: History reviewed. No pertinent family history.   Social History: Social History   Socioeconomic History  . Marital status: Single    Spouse name: Not on file  . Number of children: Not on file  . Years of education: Not on file  . Highest education level: Not on file  Occupational History  . Not on file  Social Needs  . Financial resource strain: Not on file  . Food insecurity    Worry: Not on file    Inability: Not on file  . Transportation needs    Medical: Not on file    Non-medical: Not on file  Tobacco  Use  . Smoking status: Current Every Day Smoker    Types: Cigarettes  . Smokeless tobacco: Current User  Substance and Sexual Activity  . Alcohol use: Yes    Comment: States she drinks a little and sometimes a lot.  . Drug use: No  . Sexual activity: Not on file  Lifestyle  . Physical activity    Days per week: Not on file    Minutes per session: Not on file  . Stress: Not on file  Relationships  . Social Herbalist on phone: Not on file    Gets together: Not on file    Attends religious service: Not on file    Active member of club or organization: Not on file    Attends meetings of clubs or organizations: Not on file    Relationship status: Not on file  . Intimate partner violence    Fear of current or ex partner: Not on file    Emotionally abused: Not on file    Physically abused: Not on file    Forced sexual activity: Not on file  Other Topics Concern  . Not on file  Social History Narrative  . Not on file     Review of Systems: Review of Systems  Constitutional: Negative.  Negative for chills, diaphoresis, fever, malaise/fatigue and weight loss.  HENT: Negative.  Negative for congestion, ear  discharge, ear pain, hearing loss, nosebleeds, sinus pain, sore throat and tinnitus.   Eyes: Negative.  Negative for blurred vision, double vision, photophobia, pain, discharge and redness.  Respiratory: Negative.  Negative for cough, hemoptysis, sputum production, shortness of breath, wheezing and stridor.   Cardiovascular: Negative.  Negative for chest pain, palpitations, orthopnea, claudication, leg swelling and PND.  Gastrointestinal: Negative.  Negative for abdominal pain, blood in stool, constipation, diarrhea, heartburn, melena, nausea and vomiting.  Genitourinary: Negative.  Negative for dysuria, flank pain, frequency, hematuria and urgency.  Musculoskeletal: Negative.  Negative for back pain, falls, joint pain, myalgias and neck pain.  Skin: Negative.  Negative for itching and rash.  Neurological: Negative.  Negative for dizziness, tingling, tremors, sensory change, speech change, focal weakness, seizures, loss of consciousness, weakness and headaches.  Endo/Heme/Allergies: Negative.  Negative for environmental allergies and polydipsia. Does not bruise/bleed easily.  Psychiatric/Behavioral: Negative.  Negative for depression, hallucinations, memory loss, substance abuse and suicidal ideas. The patient is not nervous/anxious and does not have insomnia.     Vital Signs: Blood pressure 133/67, pulse 84, temperature 98.4 F (36.9 C), temperature source Oral, resp. rate 14, height 5\' 5"  (1.651 m), weight 74.8 kg, SpO2 99 %.  Weight trends: Filed Weights   11/16/18 1732 11/17/18 0053  Weight: 77.1 kg 74.8 kg    Physical Exam: General: NAD,   Head: Normocephalic, atraumatic. Moist oral mucosal membranes  Eyes: Anicteric, PERRL  Neck: Supple, trachea midline  Lungs:  Clear to auscultation  Heart: Regular rate and rhythm  Abdomen:  Soft, nontender,   Extremities: no peripheral edema.  Neurologic: Nonfocal, moving all four extremities  Skin: No lesions         Lab results: Basic  Metabolic Panel: Recent Labs  Lab 11/16/18 1741 11/17/18 0525  NA 118* 124*  K 4.1 3.6  CL 87* 93*  CO2 20* 23  GLUCOSE 104* 120*  BUN <5* <5*  CREATININE 0.67 0.42*  CALCIUM 8.1* 8.0*    Liver Function Tests: Recent Labs  Lab 11/16/18 1741  AST 34  ALT 10  ALKPHOS  114  BILITOT 0.6  PROT 7.2  ALBUMIN 2.7*   No results for input(s): LIPASE, AMYLASE in the last 168 hours. No results for input(s): AMMONIA in the last 168 hours.  CBC: Recent Labs  Lab 11/16/18 1741 11/17/18 0525  WBC 6.3 5.1  HGB 7.7* 8.0*  HCT 23.8* 24.7*  MCV 77.3* 77.4*  PLT 376 294    Cardiac Enzymes: No results for input(s): CKTOTAL, CKMB, CKMBINDEX, TROPONINI in the last 168 hours.  BNP: Invalid input(s): POCBNP  CBG: Recent Labs  Lab 11/17/18 0734 11/17/18 1201  GLUCAP 90 78    Microbiology: Results for orders placed or performed during the hospital encounter of 11/16/18  SARS Coronavirus 2 (CEPHEID - Performed in [place] hospital lab), Hosp Order     Status: None   Collection Time: 11/16/18  9:03 PM   Specimen: Nasopharyngeal Swab  Result Value Ref Range Status   SARS Coronavirus 2 NEGATIVE NEGATIVE Final    Comment: (NOTE) If result is NEGATIVE SARS-CoV-2 target nucleic acids are NOT DETECTED. The SARS-CoV-2 RNA is generally detectable in upper and lower  respiratory specimens during the acute phase of infection. The lowest  concentration of SARS-CoV-2 viral copies this assay can detect is 250  copies / mL. A negative result does not preclude SARS-CoV-2 infection  and should not be used as the sole basis for treatment or other  patient management decisions.  A negative result may occur with  improper specimen collection / handling, submission of specimen other  than nasopharyngeal swab, presence of viral mutation(s) within the  areas targeted by this assay, and inadequate number of viral copies  (<250 copies / mL). A negative result must be combined with clinical   observations, patient history, and epidemiological information. If result is POSITIVE SARS-CoV-2 target nucleic acids are DETECTED. The SARS-CoV-2 RNA is generally detectable in upper and lower  respiratory specimens dur ing the acute phase of infection.  Positive  results are indicative of active infection with SARS-CoV-2.  Clinical  correlation with patient history and other diagnostic information is  necessary to determine patient infection status.  Positive results do  not rule out bacterial infection or co-infection with other viruses. If result is PRESUMPTIVE POSTIVE SARS-CoV-2 nucleic acids MAY BE PRESENT.   A presumptive positive result was obtained on the submitted specimen  and confirmed on repeat testing.  While 09/18/2017 novel coronavirus  (SARS-CoV-2) nucleic acids may be present in the submitted sample  additional confirmatory testing may be necessary for epidemiological  and / or clinical management purposes  to differentiate between  SARS-CoV-2 and other Sarbecovirus currently known to infect humans.  If clinically indicated additional testing with an alternate test  methodology [phone]) is advised. The SARS-CoV-2 RNA is generally  detectable in upper and lower respiratory sp ecimens during the acute  phase of infection. The expected result is Negative. Fact Sheet for Patients:  StrictlyIdeas.no Fact Sheet for Healthcare Providers: BankingDealers.co.za This test is not yet approved or cleared by the [place] FDA and has been authorized for detection and/or diagnosis of SARS-CoV-2 by FDA under an Emergency Use Authorization (EUA).  This EUA will remain in effect (meaning this test can be used) for the duration of the COVID-19 declaration under Section 564(b)(1) of the Act, 21 U.S.C. section 360bbb-3(b)(1), unless the authorization is terminated or revoked sooner. Performed at [facility], [address], [place], [place] [address]     Coagulation Studies: Recent Labs    11/16/18 Sep 18, 2124  LABPROT 15.7*  INR 1.3*    Urinalysis: No results for input(s): COLORURINE, LABSPEC, PHURINE, GLUCOSEU, HGBUR, BILIRUBINUR, KETONESUR, PROTEINUR, UROBILINOGEN, NITRITE, LEUKOCYTESUR in the last 72 hours.  Invalid input(s): APPERANCEUR    Imaging:  No results found.   Assessment & Plan: Donna Aguirre is a 63 y.o. black female with hypertension, gout, diabetes mellitus type II, status post exlap for peptic ulcer on 5/20. Patient went to follow up at [facility] where she was found to have critical level sodium. She was asked to come to the ED, sodium was 118. Patient has been started on normal saline infusion. Improved 124.  Continue IV fluids Most likely secondary to alcohol abuse versus tea and toast diet. Most likely from both.    LOS: 1 Thamar Holik 6/20/20202:32 PM

## 2018-11-17 NOTE — Progress Notes (Signed)
[place] [facility] [place] NAME: Husna Krone    MR#:  [id number]  DATE OF BIRTH:  1956-01-02  SUBJECTIVE:  CHIEF COMPLAINT:   Chief Complaint  Patient presents with  . Abnormal Lab   -Patient feels about the same.  Sodium is improving with IV fluids.  At 124 today  REVIEW OF SYSTEMS:  Review of Systems  Constitutional: Positive for malaise/fatigue. Negative for chills and fever.  HENT: Negative for congestion, ear discharge, hearing loss and nosebleeds.   Eyes: Negative for blurred vision and double vision.  Respiratory: Negative for cough, shortness of breath and wheezing.   Cardiovascular: Negative for chest pain and palpitations.  Gastrointestinal: Negative for abdominal pain, constipation, diarrhea, nausea and vomiting.  Genitourinary: Negative for dysuria.  Musculoskeletal: Negative for myalgias.  Neurological: Negative for dizziness, focal weakness, seizures, weakness and headaches.  Psychiatric/Behavioral: Negative for depression.    DRUG ALLERGIES:  No Known Allergies  VITALS:  Blood pressure 133/67, pulse 84, temperature 98.4 F (36.9 C), temperature source Oral, resp. rate 14, height 5\' 5"  (1.651 m), weight 74.8 kg, SpO2 99 %.  PHYSICAL EXAMINATION:  Physical Exam   GENERAL:  63 y.o.-year-old patient lying in the bed with no acute distress.  EYES: Pupils equal, round, reactive to light and accommodation. No scleral icterus. Extraocular muscles intact.  HEENT: Head atraumatic, normocephalic. Oropharynx and nasopharynx clear.  NECK:  Supple, no jugular venous distention. No thyroid enlargement, no tenderness.  LUNGS: Normal breath sounds bilaterally, no wheezing, rales,rhonchi or crepitation. No use of accessory muscles of respiration.  Decreased bibasilar breath sounds CARDIOVASCULAR: S1, S2 normal. No murmurs, rubs, or gallops.  ABDOMEN: Soft, nontender, nondistended. Bowel sounds present. No organomegaly or mass.   EXTREMITIES: No pedal edema, cyanosis, or clubbing.  NEUROLOGIC: Cranial nerves II through XII are intact. Muscle strength 5/5 in all extremities. Sensation intact. Gait not checked.  PSYCHIATRIC: The patient is alert and oriented x 3.  SKIN: No obvious rash, lesion, or ulcer.    LABORATORY PANEL:   CBC Recent Labs  Lab 11/17/18 0525  WBC 5.1  HGB 8.0*  HCT 24.7*  PLT 294   ------------------------------------------------------------------------------------------------------------------  Chemistries  Recent Labs  Lab 11/16/18 1741 11/17/18 0525  NA 118* 124*  K 4.1 3.6  CL 87* 93*  CO2 20* 23  GLUCOSE 104* 120*  BUN <5* <5*  CREATININE 0.67 0.42*  CALCIUM 8.1* 8.0*  AST 34  --   ALT 10  --   ALKPHOS 114  --   BILITOT 0.6  --    ------------------------------------------------------------------------------------------------------------------  Cardiac Enzymes No results for input(s): TROPONINI in the last 168 hours. ------------------------------------------------------------------------------------------------------------------  RADIOLOGY:  No results found.  EKG:   Orders placed or performed during the hospital encounter of 10/16/18  . ED EKG  . ED EKG    ASSESSMENT AND PLAN:   63 year old female with past medical history history of hypertension diabetes, gout arthritis who was transferred to [facility] in May 2020 from our emergency room secondary to prepyloric gastric perforation and she required exploratory laparotomy and repair with a Phillip Heal patch.  She was sent in from PCP office secondary to hyponatremia.  1.  Hyponatremia-at this time looks like hypovolemic hyponatremia is responding well to IV normal saline. -Nephrology has been consulted.  Sodium at 124.  Continue normal saline for now -Check urine and serum studies osmolality, urine sodium levels.  2.  Anemia of chronic disease.  Her blood counts have dropped since  her surgery last month and have  remained stable around 8.  No indication for transfusion at this time.  Continue to monitor  3.  GERD-perforation of gastric ulcer history in May 2020.  Continue Protonix twice daily  4.  DVT prophylaxis-Lovenox  Patient is independent at baseline   All the records are reviewed and case discussed with Care Management/Social Workerr. Management plans discussed with the patient, family and they are in agreement.  CODE STATUS: Full code  TOTAL TIME TAKING CARE OF THIS PATIENT: 38 minutes.   POSSIBLE D/C IN 1-2 DAYS, DEPENDING ON CLINICAL CONDITION.   Gladstone Lighter M.D on 11/17/2018 at 12:18 PM  Between 7am to 6pm - Pager - [phone]  After 6pm go to [email or web address] - password [email or web address] [email or web address]  Office  [phone]  CC: Primary care physician; Patient, No Pcp Per

## 2018-11-18 LAB — BASIC METABOLIC PANEL WITH GFR
Anion gap: 6 (ref 5–15)
BUN: <5 mg/dL — ABNORMAL LOW (ref 8–23)
CO2: 25 mmol/L (ref 22–32)
Calcium: 8 mg/dL — ABNORMAL LOW (ref 8.9–10.3)
Chloride: 101 mmol/L (ref 98–111)
Creatinine, Ser: 0.38 mg/dL — ABNORMAL LOW (ref 0.44–1.00)
GFR calc Af Amer: >60 mL/min
GFR calc non Af Amer: >60 mL/min
Glucose, Bld: 117 mg/dL — ABNORMAL HIGH (ref 70–99)
Potassium: 3.2 mmol/L — ABNORMAL LOW (ref 3.5–5.1)
Sodium: 132 mmol/L — ABNORMAL LOW (ref 135–145)

## 2018-11-18 LAB — GLUCOSE, CAPILLARY
Glucose-Capillary: 103 mg/dL — ABNORMAL HIGH (ref 70–99)
Glucose-Capillary: 169 mg/dL — ABNORMAL HIGH (ref 70–99)

## 2018-11-18 MED ORDER — PANTOPRAZOLE SODIUM 40 MG PO TBEC: 40.0000 mg | DELAYED_RELEASE_TABLET | Freq: Two times a day (BID) | ORAL | 2 refills | Status: DC

## 2018-11-18 MED ORDER — POTASSIUM CHLORIDE CRYS ER 20 MEQ PO TBCR
40.0000 meq | EXTENDED_RELEASE_TABLET | Freq: Once | ORAL | Status: AC
  Administered 2018-11-18: 40 meq via ORAL
  Filled 2018-11-18: qty 2

## 2018-11-18 NOTE — Progress Notes (Signed)
[facility]  ROUNDING NOTE   Subjective:   Na 132  Eating breakfast when examined this morning.   Objective:  Vital signs in last 24 hours:  Temp:  [98.3 F (36.8 C)-99 F (37.2 C)] 98.3 F (36.8 C) (06/21 0754) Pulse Rate:  [84-96] 84 (06/21 0754) Resp:  [16-19] 16 (06/21 0754) BP: (142-160)/(70-84) 146/71 (06/21 0754) SpO2:  [97 %-100 %] 100 % (06/21 0754)  Weight change:  Filed Weights   11/16/18 1732 11/17/18 0053  Weight: 77.1 kg 74.8 kg    Intake/Output: I/O last 3 completed shifts: In: 2489.9 [P.O.:480; I.V.:2009.9] Out: 600 [Urine:600]   Intake/Output this shift:  No intake/output data recorded.  Physical Exam: General: NAD,   Head: Normocephalic, atraumatic. Moist oral mucosal membranes  Eyes: Anicteric, PERRL  Neck: Supple, trachea midline  Lungs:  Clear to auscultation  Heart: Regular rate and rhythm  Abdomen:  Soft, nontender,   Extremities:  no peripheral edema.  Neurologic: Nonfocal, moving all four extremities  Skin: No lesions        Basic Metabolic Panel: Recent Labs  Lab 11/16/18 1741 11/17/18 0525 11/18/18 0629  NA 118* 124* 132*  K 4.1 3.6 3.2*  CL 87* 93* 101  CO2 20* 23 25  GLUCOSE 104* 120* 117*  BUN <5* <5* <5*  CREATININE 0.67 0.42* 0.38*  CALCIUM 8.1* 8.0* 8.0*    Liver Function Tests: Recent Labs  Lab 11/16/18 1741  AST 34  ALT 10  ALKPHOS 114  BILITOT 0.6  PROT 7.2  ALBUMIN 2.7*   No results for input(s): LIPASE, AMYLASE in the last 168 hours. No results for input(s): AMMONIA in the last 168 hours.  CBC: Recent Labs  Lab 11/16/18 1741 11/17/18 0525  WBC 6.3 5.1  HGB 7.7* 8.0*  HCT 23.8* 24.7*  MCV 77.3* 77.4*  PLT 376 294    Cardiac Enzymes: No results for input(s): CKTOTAL, CKMB, CKMBINDEX, TROPONINI in the last 168 hours.  BNP: Invalid input(s): POCBNP  CBG: Recent Labs  Lab 11/17/18 1201 11/17/18 1647 11/17/18 2056 11/18/18 0752 11/18/18 1147  GLUCAP 78 196* 146* 103*  169*    Microbiology: Results for orders placed or performed during the hospital encounter of 11/16/18  SARS Coronavirus 2 (CEPHEID - Performed in [place] hospital lab), Hosp Order     Status: None   Collection Time: 11/16/18  9:03 PM   Specimen: Nasopharyngeal Swab  Result Value Ref Range Status   SARS Coronavirus 2 NEGATIVE NEGATIVE Final    Comment: (NOTE) If result is NEGATIVE SARS-CoV-2 target nucleic acids are NOT DETECTED. The SARS-CoV-2 RNA is generally detectable in upper and lower  respiratory specimens during the acute phase of infection. The lowest  concentration of SARS-CoV-2 viral copies this assay can detect is 250  copies / mL. A negative result does not preclude SARS-CoV-2 infection  and should not be used as the sole basis for treatment or other  patient management decisions.  A negative result may occur with  improper specimen collection / handling, submission of specimen other  than nasopharyngeal swab, presence of viral mutation(s) within the  areas targeted by this assay, and inadequate number of viral copies  (<250 copies / mL). A negative result must be combined with clinical  observations, patient history, and epidemiological information. If result is POSITIVE SARS-CoV-2 target nucleic acids are DETECTED. The SARS-CoV-2 RNA is generally detectable in upper and lower  respiratory specimens dur ing the acute phase of infection.  Positive  results  are indicative of active infection with SARS-CoV-2.  Clinical  correlation with patient history and other diagnostic information is  necessary to determine patient infection status.  Positive results do  not rule out bacterial infection or co-infection with other viruses. If result is PRESUMPTIVE POSTIVE SARS-CoV-2 nucleic acids MAY BE PRESENT.   A presumptive positive result was obtained on the submitted specimen  and confirmed on repeat testing.  While 26-Aug-2017 novel coronavirus  (SARS-CoV-2) nucleic acids may be  present in the submitted sample  additional confirmatory testing may be necessary for epidemiological  and / or clinical management purposes  to differentiate between  SARS-CoV-2 and other Sarbecovirus currently known to infect humans.  If clinically indicated additional testing with an alternate test  methodology [phone] is advised. The SARS-CoV-2 RNA is generally  detectable in upper and lower respiratory sp ecimens during the acute  phase of infection. The expected result is Negative. Fact Sheet for Patients:  StrictlyIdeas.no Fact Sheet for Healthcare Providers: BankingDealers.co.za This test is not yet approved or cleared by the [place] FDA and has been authorized for detection and/or diagnosis of SARS-CoV-2 by FDA under an Emergency Use Authorization (EUA).  This EUA will remain in effect (meaning this test can be used) for the duration of the COVID-19 declaration under Section 564(b)(1) of the Act, 21 U.S.C. section 360bbb-3(b)(1), unless the authorization is terminated or revoked sooner. Performed at [facility], [address] [place], [place] [address]     Coagulation Studies: Recent Labs    11/16/18 08/26/24  LABPROT 15.7*  INR 1.3*    Urinalysis: No results for input(s): COLORURINE, LABSPEC, PHURINE, GLUCOSEU, HGBUR, BILIRUBINUR, KETONESUR, PROTEINUR, UROBILINOGEN, NITRITE, LEUKOCYTESUR in the last 72 hours.  Invalid input(s): APPERANCEUR    Imaging: No results found.   Medications:   . sodium chloride 75 mL/hr at 11/18/18 0229   . enoxaparin (LOVENOX) injection  40 mg Subcutaneous Q24H  . insulin aspart  0-5 Units Subcutaneous QHS  . insulin aspart  0-9 Units Subcutaneous TID WC  . pantoprazole  40 mg Oral BID AC   acetaminophen **OR** acetaminophen, albuterol, bisacodyl, ondansetron **OR** ondansetron (ZOFRAN) IV, senna-docusate  Assessment/ Plan:  [name] is a 63 y.o. black  female with hypertension, gout, diabetes mellitus type II, status post exlap for peptic ulcer on 5/20. Patient went to follow up at [facility] where she was found to have critical level sodium. She was asked to come to the ED, sodium was 118.. Improved with IV NS infusion to 132 Continue IV fluids Encourage PO intake.  Most likely secondary to alcohol abuse versus tea and toast diet. Most likely from both.    LOS: 2 Donna Aguirre 6/21/20201:06 PM

## 2018-11-18 NOTE — Progress Notes (Signed)
DISCHARGE NOTE:  Pt given discharge instructions, Pt verbalized understanding. Pt wheeled to car by staff member, daughter providing transportation.

## 2018-11-19 NOTE — Discharge Summary (Signed)
[facility] NAME: Donna Aguirre    MR#:  [id number]  DATE OF BIRTH:  11-24-1955  DATE OF ADMISSION:  11/16/2018   ADMITTING PHYSICIAN: Gladstone Lighter, MD  DATE OF DISCHARGE: 11/18/2018  2:40 PM  PRIMARY CARE PHYSICIAN: Patient, No Pcp Per   ADMISSION DIAGNOSIS:   Acute hyponatremia [E87.1]  DISCHARGE DIAGNOSIS:   Active Problems:   Hyponatremia   SECONDARY DIAGNOSIS:   Past Medical History:  Diagnosis Date  . Arthritis   . Diabetes mellitus without complication ([place])   . Gout   . Hypertension     HOSPITAL COURSE:   63 year old female with past medical history history of hypertension diabetes, gout arthritis who was transferred to [facility] in May 2020 from our emergency room secondary to prepyloric gastric perforation and she required exploratory laparotomy and repair with a Phillip Heal patch.  She was sent in from PCP office secondary to hyponatremia.  1.  Hyponatremia- looks like hypovolemic hyponatremia and has  responded well to IV normal saline. -Nephrology has been consulted.   -Low urine osmolarity and serum osmolarity as well.  Sodium improved up to 132 with fluids.  Mostly dietary causes, patient is on a tea and toast diet but not much intake since her surgery. -Educated about the same  2.  Anemia of chronic disease.  Her blood counts have dropped since her surgery last month and have remained stable around 8.  No indication for transfusion at this time.  Continue to monitor  3.  GERD-perforation of gastric ulcer history in May 2020.  Continue Protonix twice daily   Patient is independent at baseline.  Discharge home today   DISCHARGE CONDITIONS:   Guarded  CONSULTS OBTAINED:   Nephrology consult by Dr. Juleen China  DRUG ALLERGIES:   No Known Allergies DISCHARGE MEDICATIONS:   Allergies as of 11/18/2018   No Known Allergies     Medication List    TAKE these medications   pantoprazole 40 MG  tablet Commonly known as: PROTONIX Take 1 tablet (40 mg total) by mouth 2 (two) times daily before a meal.        DISCHARGE INSTRUCTIONS:   1.  PCP follow-up in 1 to 2 weeks 2.  GI follow-up at [facility] as prior scheduled  DIET:   Regular diet  ACTIVITY:   Activity as tolerated  OXYGEN:   Home Oxygen: No.  Oxygen Delivery: room air  DISCHARGE LOCATION:   home   If you experience worsening of your admission symptoms, develop shortness of breath, life threatening emergency, suicidal or homicidal thoughts you must seek medical attention immediately by calling 911 or calling your MD immediately  if symptoms less severe.  You Must read complete instructions/literature along with all the possible adverse reactions/side effects for all the Medicines you take and that have been prescribed to you. Take any new Medicines after you have completely understood and accpet all the possible adverse reactions/side effects.   Please note  You were cared for by a hospitalist during your hospital stay. If you have any questions about your discharge medications or the care you received while you were in the hospital after you are discharged, you can call the unit and asked to speak with the hospitalist on call if the hospitalist that took care of you is not available. Once you are discharged, your primary care physician will handle any further medical issues. Please note that NO REFILLS for any discharge medications will be  authorized once you are discharged, as it is imperative that you return to your primary care physician (or establish a relationship with a primary care physician if you do not have one) for your aftercare needs so that they can reassess your need for medications and monitor your lab values.    On the day of Discharge:  VITAL SIGNS:   Blood pressure (!) 155/75, pulse 87, temperature 98.3 F (36.8 C), temperature source Oral, resp. rate 16, height 5\' 5"  (1.651 m), weight 74.8 kg,  SpO2 100 %.  PHYSICAL EXAMINATION:    GENERAL:  63 y.o.-year-old patient lying in the bed with no acute distress.  EYES: Pupils equal, round, reactive to light and accommodation. No scleral icterus. Extraocular muscles intact.  HEENT: Head atraumatic, normocephalic. Oropharynx and nasopharynx clear.  NECK:  Supple, no jugular venous distention. No thyroid enlargement, no tenderness.  LUNGS: Normal breath sounds bilaterally, no wheezing, rales,rhonchi or crepitation. No use of accessory muscles of respiration.  Decreased bibasilar breath sounds CARDIOVASCULAR: S1, S2 normal. No murmurs, rubs, or gallops.  ABDOMEN: Soft, nontender, nondistended. Bowel sounds present. No organomegaly or mass.  EXTREMITIES: No pedal edema, cyanosis, or clubbing.  NEUROLOGIC: Cranial nerves II through XII are intact. Muscle strength 5/5 in all extremities. Sensation intact. Gait not checked.  PSYCHIATRIC: The patient is alert and oriented x 3.  SKIN: No obvious rash, lesion, or ulcer.   DATA REVIEW:   CBC Recent Labs  Lab 11/17/18 0525  WBC 5.1  HGB 8.0*  HCT 24.7*  PLT 294    Chemistries  Recent Labs  Lab 11/16/18 1741  11/18/18 0629  NA 118*   < > 132*  K 4.1   < > 3.2*  CL 87*   < > 101  CO2 20*   < > 25  GLUCOSE 104*   < > 117*  BUN <5*   < > <5*  CREATININE 0.67   < > 0.38*  CALCIUM 8.1*   < > 8.0*  AST 34  --   --   ALT 10  --   --   ALKPHOS 114  --   --   BILITOT 0.6  --   --    < > = values in this interval not displayed.     Microbiology Results  Results for orders placed or performed during the hospital encounter of 11/16/18  SARS Coronavirus 2 (CEPHEID - Performed in [place] hospital lab), Hosp Order     Status: None   Collection Time: 11/16/18  9:03 PM   Specimen: Nasopharyngeal Swab  Result Value Ref Range Status   SARS Coronavirus 2 NEGATIVE NEGATIVE Final    Comment: (NOTE) If result is NEGATIVE SARS-CoV-2 target nucleic acids are NOT DETECTED. The SARS-CoV-2  RNA is generally detectable in upper and lower  respiratory specimens during the acute phase of infection. The lowest  concentration of SARS-CoV-2 viral copies this assay can detect is 250  copies / mL. A negative result does not preclude SARS-CoV-2 infection  and should not be used as the sole basis for treatment or other  patient management decisions.  A negative result may occur with  improper specimen collection / handling, submission of specimen other  than nasopharyngeal swab, presence of viral mutation(s) within the  areas targeted by this assay, and inadequate number of viral copies  (<250 copies / mL). A negative result must be combined with clinical  observations, patient history, and epidemiological information. If result is POSITIVE SARS-CoV-2 target  nucleic acids are DETECTED. The SARS-CoV-2 RNA is generally detectable in upper and lower  respiratory specimens dur ing the acute phase of infection.  Positive  results are indicative of active infection with SARS-CoV-2.  Clinical  correlation with patient history and other diagnostic information is  necessary to determine patient infection status.  Positive results do  not rule out bacterial infection or co-infection with other viruses. If result is PRESUMPTIVE POSTIVE SARS-CoV-2 nucleic acids MAY BE PRESENT.   A presumptive positive result was obtained on the submitted specimen  and confirmed on repeat testing.  While 2019 novel coronavirus  (SARS-CoV-2) nucleic acids may be present in the submitted sample  additional confirmatory testing may be necessary for epidemiological  and / or clinical management purposes  to differentiate between  SARS-CoV-2 and other Sarbecovirus currently known to infect humans.  If clinically indicated additional testing with an alternate test  methodology [phone]) is advised. The SARS-CoV-2 RNA is generally  detectable in upper and lower respiratory sp ecimens during the acute  phase of  infection. The expected result is Negative. Fact Sheet for Patients:  StrictlyIdeas.no Fact Sheet for Healthcare Providers: BankingDealers.co.za This test is not yet approved or cleared by the [place] FDA and has been authorized for detection and/or diagnosis of SARS-CoV-2 by FDA under an Emergency Use Authorization (EUA).  This EUA will remain in effect (meaning this test can be used) for the duration of the COVID-19 declaration under Section 564(b)(1) of the Act, 21 U.S.C. section 360bbb-3(b)(1), unless the authorization is terminated or revoked sooner. Performed at [facility], [address] [place], [place] [address]     RADIOLOGY:  No results found.   Management plans discussed with the patient, family and they are in agreement.  CODE STATUS:  Code Status History    Date Active Date Inactive Code Status Order ID Comments User Context   11/16/2018 2358 11/18/2018 1741 Full Code [id number]  Demetrios Loll, MD ED   [facility] Care Planning Activity      TOTAL TIME TAKING CARE OF THIS PATIENT: 38  minutes.    Gladstone Lighter M.D on 11/19/2018 at 10:55 AM  Between 7am to 6pm - Pager - [phone]  After 6pm go to [email or web address] - Proofreader  [facility]  Office  [phone]  CC: Primary care physician; Patient, No Pcp Per   Note: This dictation was prepared with Dragon dictation along with smaller phrase technology. Any transcriptional errors that result from this process are unintentional.

## 2018-11-20 LAB — HIV ANTIBODY (ROUTINE TESTING W REFLEX): HIV Screen 4th Generation wRfx: NONREACTIVE

## 2019-01-23 ENCOUNTER — Emergency Department
Admission: EM | Admit: 2019-01-23 | Discharge: 2019-01-24 | Disposition: A | Discharge status: Home / Self Care | Payer: Medicaid Other | Attending: Emergency Medicine | Admitting: Emergency Medicine

## 2019-01-23 ENCOUNTER — Other Ambulatory Visit: Payer: Self-pay

## 2019-01-23 ENCOUNTER — Encounter: Payer: Self-pay | Admitting: Emergency Medicine

## 2019-01-23 DIAGNOSIS — R531 Weakness: Secondary | ICD-10-CM | POA: Diagnosis not present

## 2019-01-23 DIAGNOSIS — E119 Type 2 diabetes mellitus without complications: Secondary | ICD-10-CM | POA: Diagnosis not present

## 2019-01-23 DIAGNOSIS — E876 Hypokalemia: Secondary | ICD-10-CM | POA: Diagnosis not present

## 2019-01-23 DIAGNOSIS — E871 Hypo-osmolality and hyponatremia: Secondary | ICD-10-CM | POA: Diagnosis not present

## 2019-01-23 DIAGNOSIS — Z79899 Other long term (current) drug therapy: Secondary | ICD-10-CM | POA: Diagnosis not present

## 2019-01-23 DIAGNOSIS — F17210 Nicotine dependence, cigarettes, uncomplicated: Secondary | ICD-10-CM | POA: Insufficient documentation

## 2019-01-23 DIAGNOSIS — I10 Essential (primary) hypertension: Secondary | ICD-10-CM | POA: Insufficient documentation

## 2019-01-23 DIAGNOSIS — R42 Dizziness and giddiness: Secondary | ICD-10-CM | POA: Diagnosis present

## 2019-01-23 NOTE — ED Triage Notes (Signed)
Pt to triage via w/c, mask in place; generalized weakness noted; c/o dizziness today; denies any recent illness, denies any accomp symptoms, denies hx of same

## 2019-01-24 LAB — CBC WITH DIFFERENTIAL/PLATELET
Abs Immature Granulocytes: 0.02 10*3/uL (ref 0.00–0.07)
Basophils Absolute: 0 10*3/uL (ref 0.0–0.1)
Basophils Relative: 1 %
Eosinophils Absolute: 0.1 10*3/uL (ref 0.0–0.5)
Eosinophils Relative: 1 %
HCT: 29.8 % — ABNORMAL LOW (ref 36.0–46.0)
Hemoglobin: 9.5 g/dL — ABNORMAL LOW (ref 12.0–15.0)
Immature Granulocytes: 0 %
Lymphocytes Relative: 24 %
Lymphs Abs: 1.5 10*3/uL (ref 0.7–4.0)
MCH: 22.5 pg — ABNORMAL LOW (ref 26.0–34.0)
MCHC: 31.9 g/dL (ref 30.0–36.0)
MCV: 70.6 fL — ABNORMAL LOW (ref 80.0–100.0)
Monocytes Absolute: 0.8 10*3/uL (ref 0.1–1.0)
Monocytes Relative: 13 %
Neutro Abs: 3.7 10*3/uL (ref 1.7–7.7)
Neutrophils Relative %: 61 %
Platelets: 324 10*3/uL (ref 150–400)
RBC: 4.22 MIL/uL (ref 3.87–5.11)
RDW: 18.1 % — ABNORMAL HIGH (ref 11.5–15.5)
WBC: 6 10*3/uL (ref 4.0–10.5)
nRBC: 0 % (ref 0.0–0.2)

## 2019-01-24 LAB — BASIC METABOLIC PANEL
Anion gap: 10 (ref 5–15)
BUN: <5 mg/dL — ABNORMAL LOW (ref 8–23)
CO2: 26 mmol/L (ref 22–32)
Calcium: 9.3 mg/dL (ref 8.9–10.3)
Chloride: 94 mmol/L — ABNORMAL LOW (ref 98–111)
Creatinine, Ser: 0.7 mg/dL (ref 0.44–1.00)
GFR calc Af Amer: >60 mL/min (ref >60)
GFR calc non Af Amer: >60 mL/min (ref >60)
Glucose, Bld: 165 mg/dL — ABNORMAL HIGH (ref 70–99)
Potassium: 3 mmol/L — ABNORMAL LOW (ref 3.5–5.1)
Sodium: 130 mmol/L — ABNORMAL LOW (ref 135–145)

## 2019-01-24 LAB — MAGNESIUM: Magnesium: 1.4 mg/dL — ABNORMAL LOW (ref 1.7–2.4)

## 2019-01-24 MED ORDER — POTASSIUM CHLORIDE CRYS ER 20 MEQ PO TBCR
40.0000 meq | EXTENDED_RELEASE_TABLET | Freq: Once | ORAL | Status: AC
  Administered 2019-01-24: 40 meq via ORAL
  Filled 2019-01-24: qty 2

## 2019-01-24 MED ORDER — SODIUM CHLORIDE 0.9 % IV BOLUS: 500.0000 mL | Freq: Once | INTRAVENOUS | Status: DC

## 2019-01-24 NOTE — ED Provider Notes (Signed)
[facility] Emergency Department Provider Note  ____________________________________________   First MD Initiated Contact with Patient 01/24/19 0007     (approximate)  I have reviewed the triage vital signs and the nursing notes.   HISTORY  Chief Complaint Dizziness    HPI Donna Aguirre is a 63 y.o. female with medical history as listed below who presents by EMS for evaluation of "dizziness".  When asked her what that meant she actually describes more of a generalized weakness.  She says that she was doing some moving earlier today and then tonight she just felt weak all over.  She feels better now.  She denies headache, sore throat, chest pain, shortness of breath, nausea, vomiting, abdominal pain.  She always has a little bit of a cough.  She says she probably does not eat and drink as much as she is supposed to.  She has no weakness in any one arm or leg more than the other.  No numbness or tingling..  No similar symptoms in the past.  Denies history of stroke.  She describes onset is somewhat gradual this evening and nothing particular makes it better or worse, it was severe earlier but now it is mild and almost gone.         Past Medical History:  Diagnosis Date  . Arthritis   . Diabetes mellitus without complication ([place])   . Gout   . Hypertension     Patient Active Problem List   Diagnosis Date Noted  . Hyponatremia 11/16/2018    History reviewed. No pertinent surgical history.  Prior to Admission medications   Medication Sig Start Date End Date Taking? Authorizing Provider  pantoprazole (PROTONIX) 40 MG tablet Take 1 tablet (40 mg total) by mouth 2 (two) times daily before a meal. 11/18/18   Gladstone Lighter, MD    Allergies Patient has no known allergies.  No family history on file.  Social History Social History   Tobacco Use  . Smoking status: Current Every Day Smoker    Types: Cigarettes  . Smokeless tobacco: Current User   Substance Use Topics  . Alcohol use: Yes    Comment: States she drinks a little and sometimes a lot.  . Drug use: No    Review of Systems Constitutional: No fever/chills Eyes: No visual changes. ENT: No sore throat. Cardiovascular: Denies chest pain. Respiratory: Denies shortness of breath. Gastrointestinal: No abdominal pain.  No nausea, no vomiting.  No diarrhea.  No constipation. Genitourinary: Negative for dysuria. Musculoskeletal: Negative for neck pain.  Negative for back pain. Integumentary: Negative for rash. Neurological: Generalized weakness.  Negative for headaches, focal weakness or numbness.   ____________________________________________   PHYSICAL EXAM:  VITAL SIGNS: ED Triage Vitals  Enc Vitals Group     BP 01/23/19 2341 115/71     Pulse Rate 01/23/19 2341 97     Resp 01/23/19 2341 17     Temp 01/23/19 2341 99 F (37.2 C)     Temp Source 01/23/19 2341 Oral     SpO2 01/23/19 2341 99 %     Weight 01/23/19 2337 70.3 kg (155 lb)     Height 01/23/19 2337 1.651 m (5\' 5" )     Head Circumference --      Peak Flow --      Pain Score 01/23/19 2356 0     Pain Loc --      Pain Edu? --      Excl. in [place]? --  Constitutional: Alert and oriented.  Appears older than chronological age but is in no acute distress. Eyes: Conjunctivae are normal.  Head: Atraumatic. Nose: No congestion/rhinnorhea. Mouth/Throat: Mucous membranes are moist. Neck: No stridor.  No meningeal signs.   Cardiovascular: Normal rate, regular rhythm. Good peripheral circulation. Grossly normal heart sounds. Respiratory: Normal respiratory effort.  No retractions. Gastrointestinal: Soft and nontender. No distention.  Musculoskeletal: No lower extremity tenderness nor edema. No gross deformities of extremities. Neurologic:  Normal speech and language. No gross focal neurologic deficits are appreciated.  She has good muscle strength in bilateral arms and legs and is reporting no subjective  sensory loss. Skin:  Skin is warm, dry and intact. Psychiatric: Mood and affect are normal. Speech and behavior are normal.  ____________________________________________   LABS (all labs ordered are listed, but only abnormal results are displayed)  Labs Reviewed  CBC WITH DIFFERENTIAL/PLATELET - Abnormal; Notable for the following components:      Result Value   Hemoglobin 9.5 (*)    HCT 29.8 (*)    MCV 70.6 (*)    MCH 22.5 (*)    RDW 18.1 (*)    All other components within normal limits  BASIC METABOLIC PANEL - Abnormal; Notable for the following components:   Sodium 130 (*)    Potassium 3.0 (*)    Chloride 94 (*)    Glucose, Bld 165 (*)    BUN <5 (*)    All other components within normal limits  MAGNESIUM - Abnormal; Notable for the following components:   Magnesium 1.4 (*)    All other components within normal limits  URINALYSIS, ROUTINE W REFLEX MICROSCOPIC   ____________________________________________  EKG  ED ECG REPORT I, Hinda Kehr, the attending physician, personally viewed and interpreted this ECG.  Date: 01/23/2019 EKG Time: 23:48 Rate: 89 Rhythm: normal sinus rhythm QRS Axis: normal Intervals: normal ST/T Wave abnormalities: Non-specific ST segment / T-wave changes, but no clear evidence of acute ischemia. Narrative Interpretation: no definitive evidence of acute ischemia; does not meet STEMI criteria.   ____________________________________________  RADIOLOGY I, Hinda Kehr, personally viewed and evaluated these images (plain radiographs) as part of my medical decision making, as well as reviewing the written report by the radiologist.  ED MD interpretation: No indication for emergent imaging  Official radiology report(s): No results found.  ____________________________________________   PROCEDURES   Procedure(s) performed (including Critical Care):  Procedures   ____________________________________________   INITIAL IMPRESSION /  MDM / [place] / ED COURSE  As part of my medical decision making, I reviewed the following data within the [place] notes reviewed and incorporated, Labs reviewed , EKG interpreted , Old EKG reviewed, Old chart reviewed, Notes from prior ED visits and [place] Controlled Substance Database   Differential diagnosis includes, but is not limited to, hyponatremia, dehydration, acute infection, less likely ACS or CVA.  The patient has some nonspecific EKG changes but is having absolutely no chest pain or shortness of breath.  No indication of CVA and she is neurologically intact.  She is describing being generally weak all over after moving around some boxes today and she is not used to doing that.  She says she feels better and is ready to go home.  No contact with COVID-19 patients.  She had a prior and relatively recent admission for severe hyponatremia with a sodium of 118 and it was thought to be an issue of volume depletion.  Improved with IV hydration.  Her  lab work tonight is notable for some mild hyponatremia, hypokalemia, and hypomagnesemia.  I think this is likely volume related.  I gave her 500 mL of normal saline and she said that she felt better and again reiterated that she is ready to go home.  No indication to keep her for further evaluation or treatment.  I encouraged her to drink plenty of fluids and try to stick with a good and consistent diet and follow-up with her doctor with a phone call in the morning.  She says she understands and agrees with the plan.  Of note, the patient was discharged during planned epic downtime so the AVS does not reflect exactly what was presented to the patient.          ____________________________________________  FINAL CLINICAL IMPRESSION(S) / ED DIAGNOSES  Final diagnoses:  Generalized weakness  Hypokalemia  Hyponatremia  Hypomagnesemia     MEDICATIONS GIVEN DURING THIS VISIT:  Medications  sodium  chloride 0.9 % bolus 500 mL (0 mLs Intravenous Stopped 01/24/19 0018)  potassium chloride SA (K-DUR) CR tablet 40 mEq (40 mEq Oral Given 01/24/19 0042)     ED Discharge Orders    None      *Please note:  Donna Aguirre was evaluated in Emergency Department on 01/24/2019 for the symptoms described in the history of present illness. She was evaluated in the context of the global COVID-19 pandemic, which necessitated consideration that the patient might be at risk for infection with the SARS-CoV-2 virus that causes COVID-19. Institutional protocols and algorithms that pertain to the evaluation of patients at risk for COVID-19 are in a state of rapid change based on information released by regulatory bodies including the CDC and federal and state organizations. These policies and algorithms were followed during the patient's care in the ED.  Some ED evaluations and interventions may be delayed as a result of limited staffing during the pandemic.*  Note:  This document was prepared using Dragon voice recognition software and may include unintentional dictation errors.   Hinda Kehr, MD 01/24/19 [phone]

## 2019-01-26 ENCOUNTER — Encounter: Payer: Self-pay | Admitting: Emergency Medicine

## 2019-01-26 ENCOUNTER — Other Ambulatory Visit: Payer: Self-pay

## 2019-01-26 ENCOUNTER — Emergency Department: Payer: Medicaid Other

## 2019-01-26 ENCOUNTER — Emergency Department
Admission: EM | Admit: 2019-01-26 | Discharge: 2019-01-26 | Disposition: A | Discharge status: Home / Self Care | Payer: Medicaid Other | Attending: Student | Admitting: Student

## 2019-01-26 DIAGNOSIS — Z20828 Contact with and (suspected) exposure to other viral communicable diseases: Secondary | ICD-10-CM | POA: Diagnosis not present

## 2019-01-26 DIAGNOSIS — I10 Essential (primary) hypertension: Secondary | ICD-10-CM | POA: Diagnosis not present

## 2019-01-26 DIAGNOSIS — R112 Nausea with vomiting, unspecified: Secondary | ICD-10-CM | POA: Insufficient documentation

## 2019-01-26 DIAGNOSIS — E119 Type 2 diabetes mellitus without complications: Secondary | ICD-10-CM | POA: Diagnosis not present

## 2019-01-26 DIAGNOSIS — F17210 Nicotine dependence, cigarettes, uncomplicated: Secondary | ICD-10-CM | POA: Diagnosis not present

## 2019-01-26 DIAGNOSIS — R1084 Generalized abdominal pain: Secondary | ICD-10-CM | POA: Diagnosis not present

## 2019-01-26 LAB — CBC
HCT: 32.7 % — ABNORMAL LOW (ref 36.0–46.0)
Hemoglobin: 10.3 g/dL — ABNORMAL LOW (ref 12.0–15.0)
MCH: 22.1 pg — ABNORMAL LOW (ref 26.0–34.0)
MCHC: 31.5 g/dL (ref 30.0–36.0)
MCV: 70.2 fL — ABNORMAL LOW (ref 80.0–100.0)
Platelets: 275 10*3/uL (ref 150–400)
RBC: 4.66 MIL/uL (ref 3.87–5.11)
RDW: 18.4 % — ABNORMAL HIGH (ref 11.5–15.5)
WBC: 5 10*3/uL (ref 4.0–10.5)
nRBC: 0 % (ref 0.0–0.2)

## 2019-01-26 LAB — TROPONIN I (HIGH SENSITIVITY): Troponin I (High Sensitivity): 5 ng/L (ref <18)

## 2019-01-26 LAB — COMPREHENSIVE METABOLIC PANEL
ALT: 12 U/L (ref 0–44)
AST: 25 U/L (ref 15–41)
Albumin: 3.5 g/dL (ref 3.5–5.0)
Alkaline Phosphatase: 89 U/L (ref 38–126)
Anion gap: 10 (ref 5–15)
BUN: <5 mg/dL — ABNORMAL LOW (ref 8–23)
CO2: 25 mmol/L (ref 22–32)
Calcium: 9.2 mg/dL (ref 8.9–10.3)
Chloride: 96 mmol/L — ABNORMAL LOW (ref 98–111)
Creatinine, Ser: 0.47 mg/dL (ref 0.44–1.00)
GFR calc Af Amer: >60 mL/min (ref >60)
GFR calc non Af Amer: >60 mL/min (ref >60)
Glucose, Bld: 173 mg/dL — ABNORMAL HIGH (ref 70–99)
Potassium: 2.9 mmol/L — ABNORMAL LOW (ref 3.5–5.1)
Sodium: 131 mmol/L — ABNORMAL LOW (ref 135–145)
Total Bilirubin: 1.3 mg/dL — ABNORMAL HIGH (ref 0.3–1.2)
Total Protein: 8.9 g/dL — ABNORMAL HIGH (ref 6.5–8.1)

## 2019-01-26 LAB — LIPASE, BLOOD: Lipase: 21 U/L (ref 11–51)

## 2019-01-26 IMAGING — CT CT ABDOMEN AND PELVIS WITH CONTRAST
2 of 5 series · 15 of 46 positions shown, 17 images · IV contrast (APPLIED)
Comparison: CT scan [DATE]

CLINICAL DATA: Nausea and vomiting. A CT scan from [DATE]
demonstrated a perforation, apparently in the anterior aspect of the
gastric antrum, most consistent with an ulcer.

EXAM:
CT ABDOMEN AND PELVIS WITH CONTRAST
TECHNIQUE: Multidetector CT imaging of the abdomen and pelvis was performed
using the standard protocol following bolus administration of
intravenous contrast.
CONTRAST:  100mL OMNIPAQUE IOHEXOL 300 MG/ML  SOLN

[Series 2: axial st · axial · 0.83mm/px · z∈[-334,+36]mm · 12 of 84 slices shown, 14 images]
[im 5/84  soft-tissue]
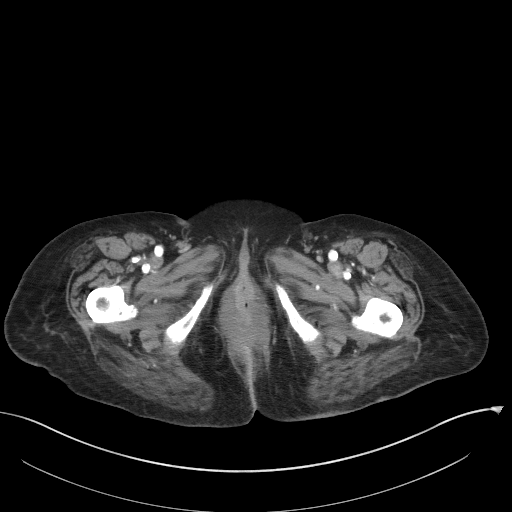
[im 5/84  bone]
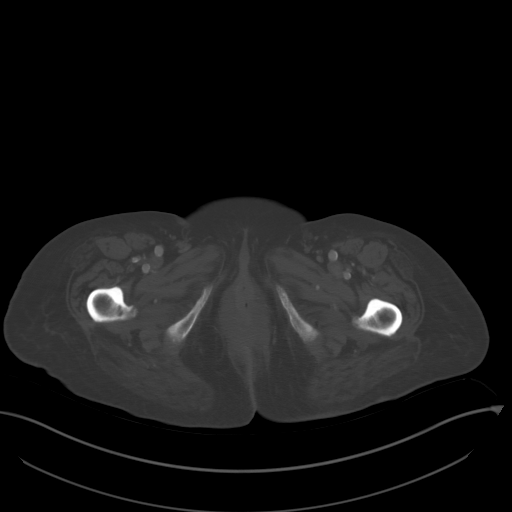
[im 14/84  soft-tissue]
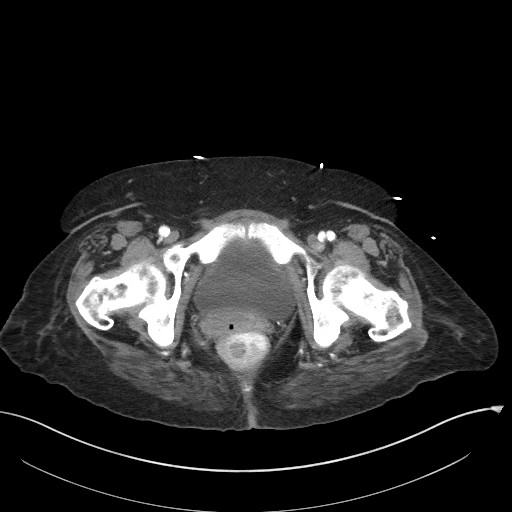
[im 18/84  soft-tissue]
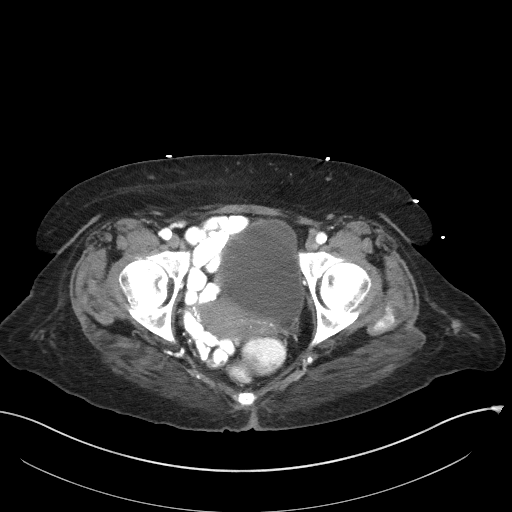
[im 27/84  soft-tissue]
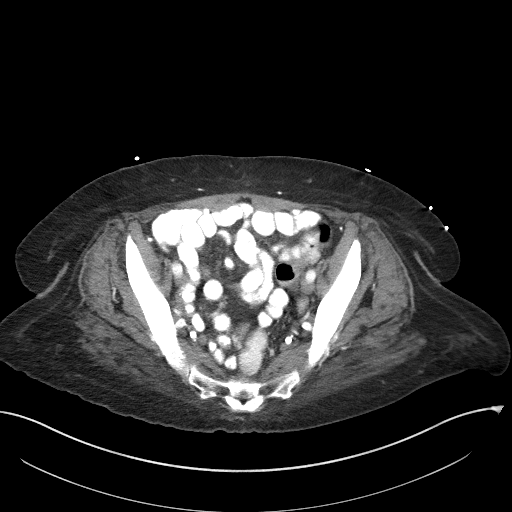
[im 31/84  soft-tissue]
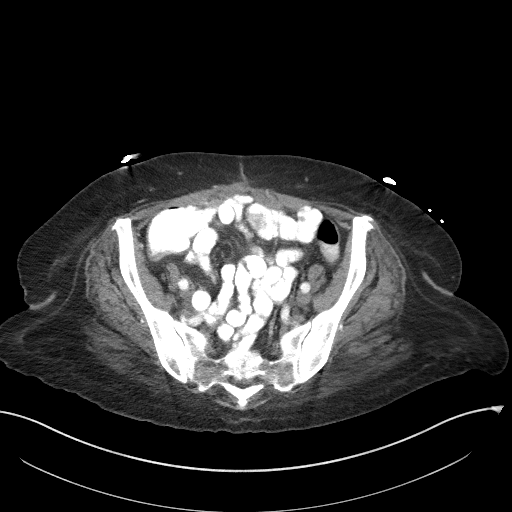
[im 40/84  soft-tissue]
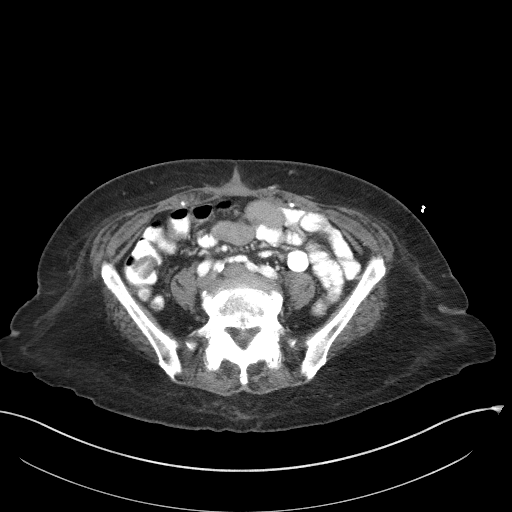
[im 44/84  soft-tissue]
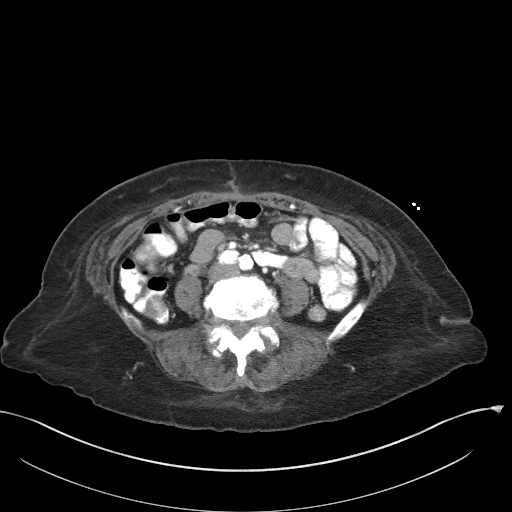
[im 53/84  soft-tissue]
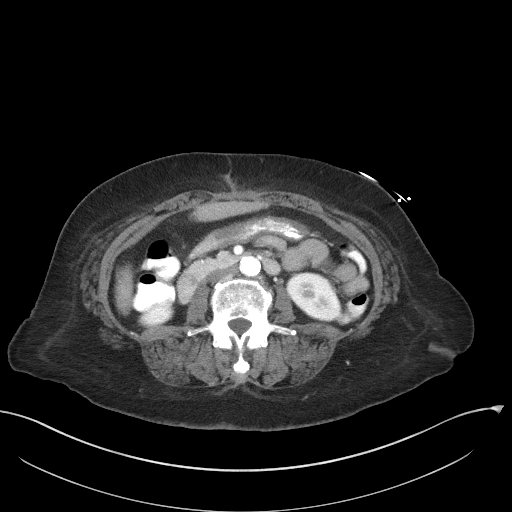
[im 57/84  soft-tissue]
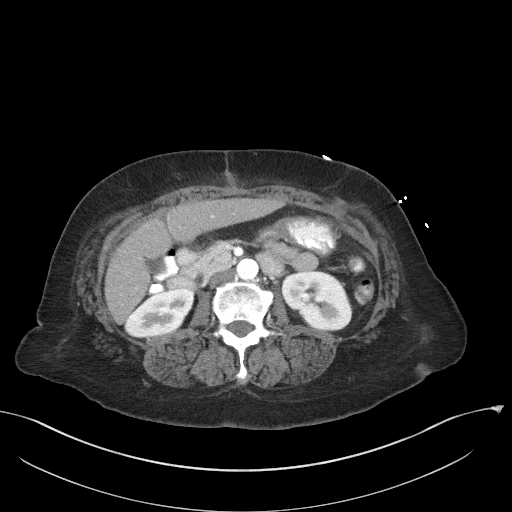
[im 57/84  bone]
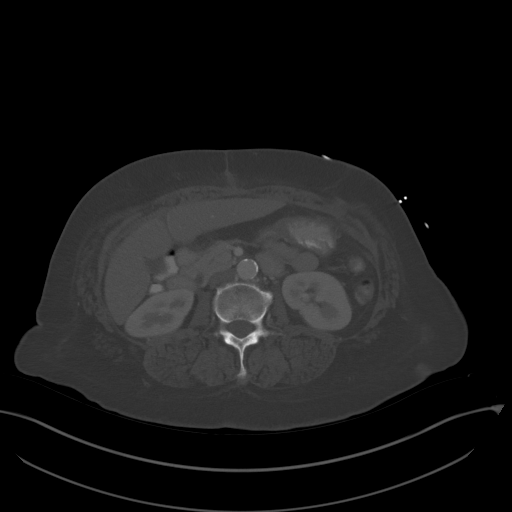
[im 66/84  soft-tissue]
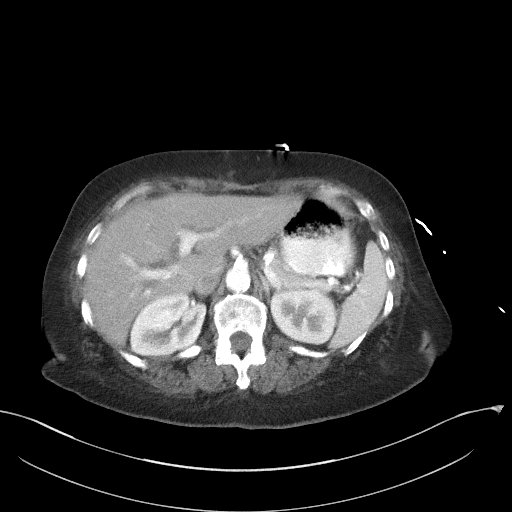
[im 70/84  soft-tissue]
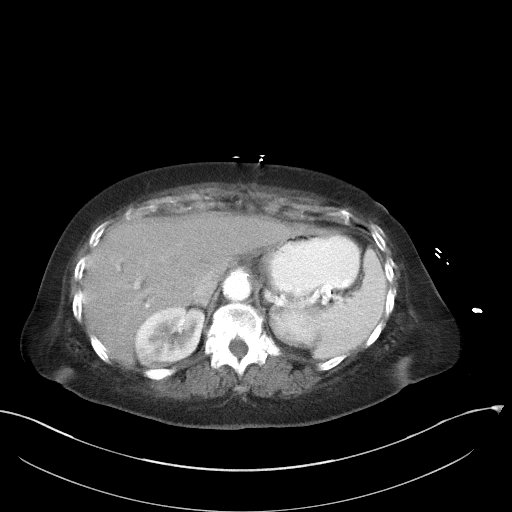
[im 79/84  soft-tissue]
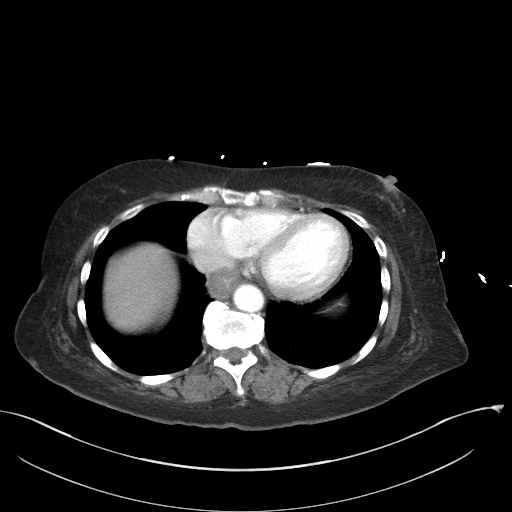

[Series 5: coronal st · coronal · 0.67mm/px · 3 of 77 slices shown]
[im 26/77  soft-tissue]
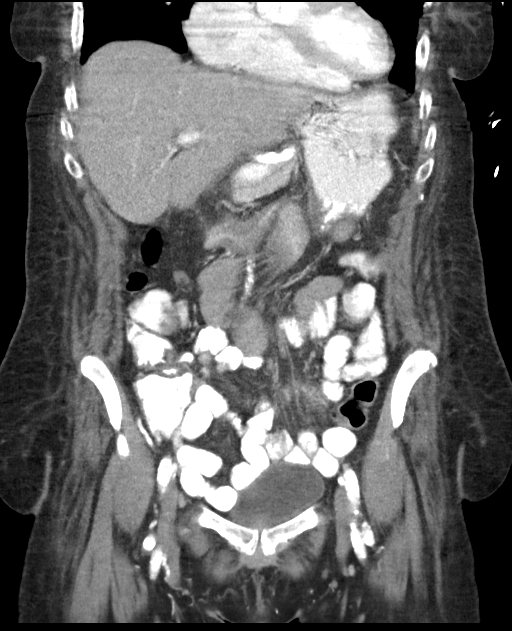
[im 34/77  soft-tissue]
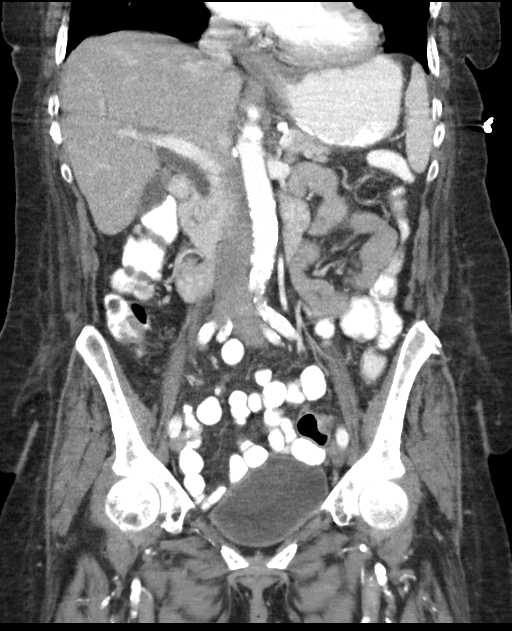
[im 43/77  soft-tissue]
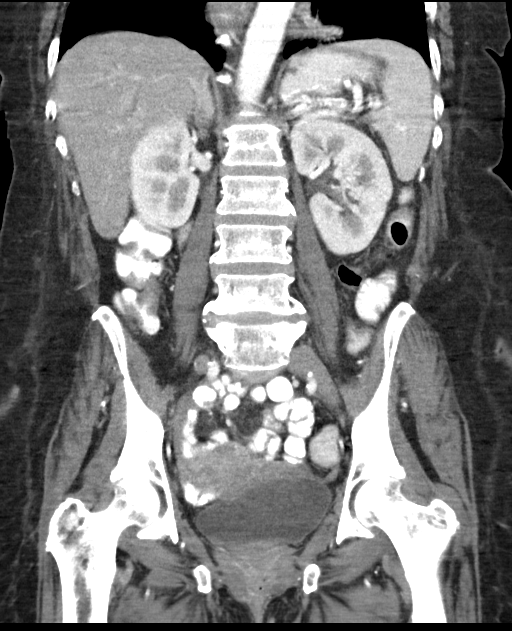

[15 of 46 positions shown; findings below may reference images not displayed]

FINDINGS: Lower chest: The lung bases are normal. There is marked thickening
of the distal esophageal wall which is stable since [DATE]. In
fact, this finding is similar on the [W5] study. The thickening
extends to the GE junction but is more prominent more proximally.
The heart visualized portions of the thoracic aorta are normal. No
other abnormalities identified in the lower chest.

Hepatobiliary: Hepatic steatosis is identified. No liver lesions are
noted. The gallbladder is normal. Portal vein is patent. Intra and
extrahepatic biliary duct dilatation is stable since at least [DATE].

Pancreas: Unremarkable. No pancreatic ductal dilatation or
surrounding inflammatory changes.

Spleen: Normal in size without focal abnormality.

Adrenals/Urinary Tract: Adrenal glands are unremarkable. Kidneys are
normal, without renal calculi, focal lesion, or hydronephrosis.
Bladder is unremarkable.

Stomach/Bowel: Mild increased attenuation in the fat adjacent to the
gastric antrum is probably from the GONZO placed at the time
of the previous ulcer repair. This finding is well seen on series 2,
image 29. The stomach is otherwise unchanged and unremarkable. The
small bowel is normal. A few scattered colonic diverticuli are seen
without diverticulitis. The colon is otherwise within normal limits.
The appendix is normal.

Vascular/Lymphatic: Atherosclerotic changes are seen in the
nonaneurysmal aorta and branching vessels. The atherosclerotic
changes extend into the iliac and femoral vessels. No adenopathy.

Reproductive: Uterus and bilateral adnexa are unremarkable.

Other: No free air is identified on today's study. The previously
identified free air has resolved. No free fluid is identified on
today's study.

Musculoskeletal: No acute or significant osseous findings.
IMPRESSION: 1. No cause for the patient's symptoms identified. The patient is
status post placement of GONZO over the previous perforated
gastric antrum ulcer. The mild increased attenuation in the fat
adjacent to the gastric antrum is likely from the previous surgery
and the omentum used to patch the ulceration. There is no evidence
of perforated ulceration today.
2. Marked thickening of the visualized distal esophagus. This was
also present on the CT scan from [W5]. Infectious, inflammatory, or
malignant causes are possible but the stability for 9 years would
argue against malignancy. Recommend clinical correlation and direct
visualization if clinically warranted.
3. Atherosclerotic changes in the aorta.
4. Stable intra and extrahepatic biliary duct dilatation of
uncertain etiology. Recommend correlation with labs. The stability
is reassuring.
5. Diverticulosis without diverticulitis.

## 2019-01-26 MED ORDER — IOHEXOL 300 MG/ML  SOLN
100.0000 mL | Freq: Once | INTRAMUSCULAR | Status: AC | PRN
  Administered 2019-01-26: 100 mL via INTRAVENOUS

## 2019-01-26 MED ORDER — SODIUM CHLORIDE 0.9 % IV BOLUS
1000.0000 mL | Freq: Once | INTRAVENOUS | Status: AC
  Administered 2019-01-26: 1000 mL via INTRAVENOUS

## 2019-01-26 MED ORDER — POTASSIUM CHLORIDE 10 MEQ/100ML IV SOLN
10.0000 meq | INTRAVENOUS | Status: AC
  Administered 2019-01-26 (×2): 10 meq via INTRAVENOUS
  Filled 2019-01-26 (×2): qty 100

## 2019-01-26 MED ORDER — ONDANSETRON 4 MG PO TBDP
4.0000 mg | ORAL_TABLET | Freq: Once | ORAL | Status: AC | PRN
  Administered 2019-01-26: 4 mg via ORAL

## 2019-01-26 MED ORDER — ONDANSETRON 4 MG PO TBDP
ORAL_TABLET | ORAL | Status: AC
  Filled 2019-01-26: qty 1

## 2019-01-26 MED ORDER — IOHEXOL 240 MG/ML SOLN
50.0000 mL | Freq: Once | INTRAMUSCULAR | Status: AC | PRN
  Administered 2019-01-26: 50 mL via ORAL

## 2019-01-26 MED ORDER — POTASSIUM CHLORIDE CRYS ER 20 MEQ PO TBCR
40.0000 meq | EXTENDED_RELEASE_TABLET | Freq: Once | ORAL | Status: AC
  Administered 2019-01-26: 40 meq via ORAL
  Filled 2019-01-26: qty 2

## 2019-01-26 NOTE — ED Notes (Signed)
Pt had an incontinence (stool) episode. Pt given wipes, new gown and a bag for her clothes, and pt cleaned herself up.

## 2019-01-26 NOTE — ED Notes (Signed)
Patient transported to CT 

## 2019-01-26 NOTE — ED Triage Notes (Signed)
Pt arrives via [facility] for nausea. Pt denies vomiting at this time. Per EMS, pt was 97% RA, BP 192/104, and 98.3 temp. Pt had CBG 188.

## 2019-01-26 NOTE — Discharge Instructions (Addendum)
Thank you for letting us take care of you in the emergency department today.   At this time, your coronavirus swab results are pending. You should hear your results in 2-4 days if they are positive.   In the meantime, it is important to take precautions in case you are positive. This includes quarantining, wearing a mask, and social distancing.   Please continue to take your regular, prescribed medications.   Please follow up with: - Your primary care doctor to review your ER visit and follow up on your symptoms.  - Your GI doctor to talk about your esophageal narrowing  Please return to the ER for any new or worsening symptoms.

## 2019-01-26 NOTE — ED Notes (Signed)
Pt asking about wait times, informed her that she should be the next patient to be roomed to a treatment as soon as one is available.

## 2019-01-26 NOTE — ED Provider Notes (Signed)
[facility] Emergency Department Provider Note  ____________________________________________   First MD Initiated Contact with Patient 01/26/19 [phone]     (approximate)  I have reviewed the triage vital signs and the nursing notes.  History  Chief Complaint Nausea    HPI Donna Aguirre is a 63 y.o. female with history of hypertension, diabetes, gout, history of gastric perforation requiring exploratory laparotomy in May 2020 [facility]), esophageal stenosis, who presents to the emergency department for 2 days of nausea and vomiting.  Patient states for the last 2 days she has been unable to keep anything down.  She feels dehydrated because of this.  She has some mild associated generalized abdominal pain.  She does think she has had a bowel movement over the last 2 days.  She denies any fevers or sick contacts.  She denies any chest pain.  She denies any recent travel, changes in her diet, or antibiotics.  History limited, patient is an extremely poor historian.         Past Medical Hx Past Medical History:  Diagnosis Date  . Arthritis   . Diabetes mellitus without complication ([place])   . Gout   . Hypertension     Problem List Patient Active Problem List   Diagnosis Date Noted  . Hyponatremia 11/16/2018    Past Surgical Hx History reviewed. No pertinent surgical history.  Medications Prior to Admission medications   Medication Sig Start Date End Date Taking? Authorizing Provider  pantoprazole (PROTONIX) 40 MG tablet Take 1 tablet (40 mg total) by mouth 2 (two) times daily before a meal. 11/18/18   Gladstone Lighter, MD    Allergies Patient has no known allergies.  Family Hx No family history on file.  Social Hx Social History   Tobacco Use  . Smoking status: Current Every Day Smoker    Types: Cigarettes  . Smokeless tobacco: Current User  Substance Use Topics  . Alcohol use: Yes    Comment: States she drinks a little and sometimes a  lot.  . Drug use: No     Review of Systems  Constitutional: Negative for fever. Negative for chills. Eyes: Negative for visual changes. ENT: Negative for sore throat. Cardiovascular: Negative for chest pain. Respiratory: Negative for shortness of breath. Gastrointestinal: + abdominal pain, nausea, vomiting Genitourinary: Negative for dysuria. Musculoskeletal: Negative for leg swelling. Skin: Negative for rash. Neurological: Negative for for headaches.   Physical Exam  Vital Signs: ED Triage Vitals  Enc Vitals Group     BP 01/26/19 0514 (!) 193/108     Pulse Rate 01/26/19 0514 100     Resp 01/26/19 0514 18     Temp 01/26/19 0514 98.3 F (36.8 C)     Temp Source 01/26/19 0514 Oral     SpO2 01/26/19 0514 99 %     Weight 01/26/19 0515 155 lb (70.3 kg)     Height 01/26/19 0515 5\' 5"  (1.651 m)     Head Circumference --      Peak Flow --      Pain Score 01/26/19 0515 10     Pain Loc --      Pain Edu? --      Excl. in [place]? --     Constitutional: Alert and oriented.  Eyes: Conjunctivae clear. Sclera anicteric. Head: Normocephalic. Atraumatic. Nose: No congestion. No rhinorrhea. Mouth/Throat: Mucous membranes are slightly dry.  Neck: No stridor.   Cardiovascular: Normal rate, regular rhythm. No murmurs. Extremities well perfused. Respiratory: Normal  respiratory effort.  Lungs CTAB. Gastrointestinal: Soft and non-tender throughout to deep palpation in all 4 quadrants.  Vertical midline scar, well-healed. Musculoskeletal: No lower extremity edema. Neurologic:  Normal speech and language. No gross focal neurologic deficits are appreciated.  Skin: Skin is warm, dry and intact. No rash noted. Psychiatric: Mood and affect are appropriate for situation.  EKG  Personally reviewed.   Rate: 77 Rhythm: sinus Axis: normal Intervals: within normal limits No acute ischemic changes No STEMI    Radiology  CT A/P  IMPRESSION: 1. No cause for the patient's symptoms  identified. The patient is status post placement of a Phillip Heal patch over the previous perforated gastric antrum ulcer. The mild increased attenuation in the fat adjacent to the gastric antrum is likely from the previous surgery and the omentum used to patch the ulceration. There is no evidence of perforated ulceration today. 2. Marked thickening of the visualized distal esophagus. This was also present on the CT scan from 2011. Infectious, inflammatory, or malignant causes are possible but the stability for 9 years would argue against malignancy. Recommend clinical correlation and direct visualization if clinically warranted. 3. Atherosclerotic changes in the aorta. 4. Stable intra and extrahepatic biliary duct dilatation of uncertain etiology. Recommend correlation with labs. The stability is reassuring. 5. Diverticulosis without diverticulitis.   Procedures  Procedure(s) performed (including critical care):  Procedures   Initial Impression / Assessment and Plan / ED Course  63 y.o. female who presents to the ED for nausea and vomiting for 2 days, as noted above.  Ddx: obstruction, postoperative complication, gastroenteritis, viral process, atypical ACS  Plan: Labs, EKG, imaging, symptom control and reassess  Labs reveal mild hyponatremia, hypokalemia, hypochloremia, which are all largely unchanged from prior.  Will replete with NS and potassium.  Troponin negative, EKG without ischemic changes.  CT negative for any acute findings or obstruction.  Updated patient on results.  She states she is feeling much improved after the fluids and electrolyte repletion.  She has tolerated PO in the emergency department.  Given this, will plan for discharge and advised follow-up both with her PCP as well as her GI doctor.  She understands that her COVID swab is pending at discharge and will take 1 to 4 days to result.  Discussed quarantine/hygiene measures in the interim while waiting for swab  results.  Given return precautions.   Final Clinical Impression(s) / ED Diagnosis  Final diagnoses:  Nausea and vomiting in adult      Note:  This document was prepared using Dragon voice recognition software and may include unintentional dictation errors.   Lilia Pro., MD 01/26/19 1435

## 2019-01-26 NOTE — ED Notes (Signed)
Fluids and potassium restarted on pt's return from ct. Hat placed in the toilet for stool sample if she goes again.

## 2019-01-28 LAB — NOVEL CORONAVIRUS, NAA (HOSP ORDER, SEND-OUT TO REF LAB; TAT 18-24 HRS): SARS-CoV-2, NAA: NOT DETECTED

## 2019-03-04 ENCOUNTER — Other Ambulatory Visit: Payer: Self-pay

## 2019-03-04 ENCOUNTER — Emergency Department: Payer: Medicaid Other

## 2019-03-04 DIAGNOSIS — I10 Essential (primary) hypertension: Secondary | ICD-10-CM | POA: Diagnosis present

## 2019-03-04 DIAGNOSIS — Z79899 Other long term (current) drug therapy: Secondary | ICD-10-CM

## 2019-03-04 DIAGNOSIS — E119 Type 2 diabetes mellitus without complications: Secondary | ICD-10-CM | POA: Diagnosis present

## 2019-03-04 DIAGNOSIS — Z23 Encounter for immunization: Secondary | ICD-10-CM

## 2019-03-04 DIAGNOSIS — J189 Pneumonia, unspecified organism: Secondary | ICD-10-CM | POA: Diagnosis present

## 2019-03-04 DIAGNOSIS — Z20828 Contact with and (suspected) exposure to other viral communicable diseases: Secondary | ICD-10-CM | POA: Diagnosis present

## 2019-03-04 DIAGNOSIS — E871 Hypo-osmolality and hyponatremia: Principal | ICD-10-CM | POA: Diagnosis present

## 2019-03-04 DIAGNOSIS — F17210 Nicotine dependence, cigarettes, uncomplicated: Secondary | ICD-10-CM | POA: Diagnosis present

## 2019-03-04 DIAGNOSIS — F1010 Alcohol abuse, uncomplicated: Secondary | ICD-10-CM | POA: Diagnosis present

## 2019-03-04 DIAGNOSIS — E876 Hypokalemia: Secondary | ICD-10-CM | POA: Diagnosis present

## 2019-03-04 DIAGNOSIS — W1830XA Fall on same level, unspecified, initial encounter: Secondary | ICD-10-CM | POA: Diagnosis present

## 2019-03-04 DIAGNOSIS — K222 Esophageal obstruction: Secondary | ICD-10-CM | POA: Diagnosis present

## 2019-03-04 LAB — CBC
HCT: 31.3 % — ABNORMAL LOW (ref 36.0–46.0)
Hemoglobin: 10.4 g/dL — ABNORMAL LOW (ref 12.0–15.0)
MCH: 24.2 pg — ABNORMAL LOW (ref 26.0–34.0)
MCHC: 33.2 g/dL (ref 30.0–36.0)
MCV: 72.8 fL — ABNORMAL LOW (ref 80.0–100.0)
Platelets: 270 10*3/uL (ref 150–400)
RBC: 4.3 MIL/uL (ref 3.87–5.11)
RDW: 20.3 % — ABNORMAL HIGH (ref 11.5–15.5)
WBC: 7.9 10*3/uL (ref 4.0–10.5)
nRBC: 0 % (ref 0.0–0.2)

## 2019-03-04 IMAGING — CR DG CHEST 2V
1 series · 2 of 2 positions shown · non-contrast
Comparison: [DATE]

CLINICAL DATA: Chest pain

EXAM:
CHEST - 2 VIEW

[Series 1: dg chest 2 view · 0.14mm/px · 2 of 2 slices shown]
[im 1/2]
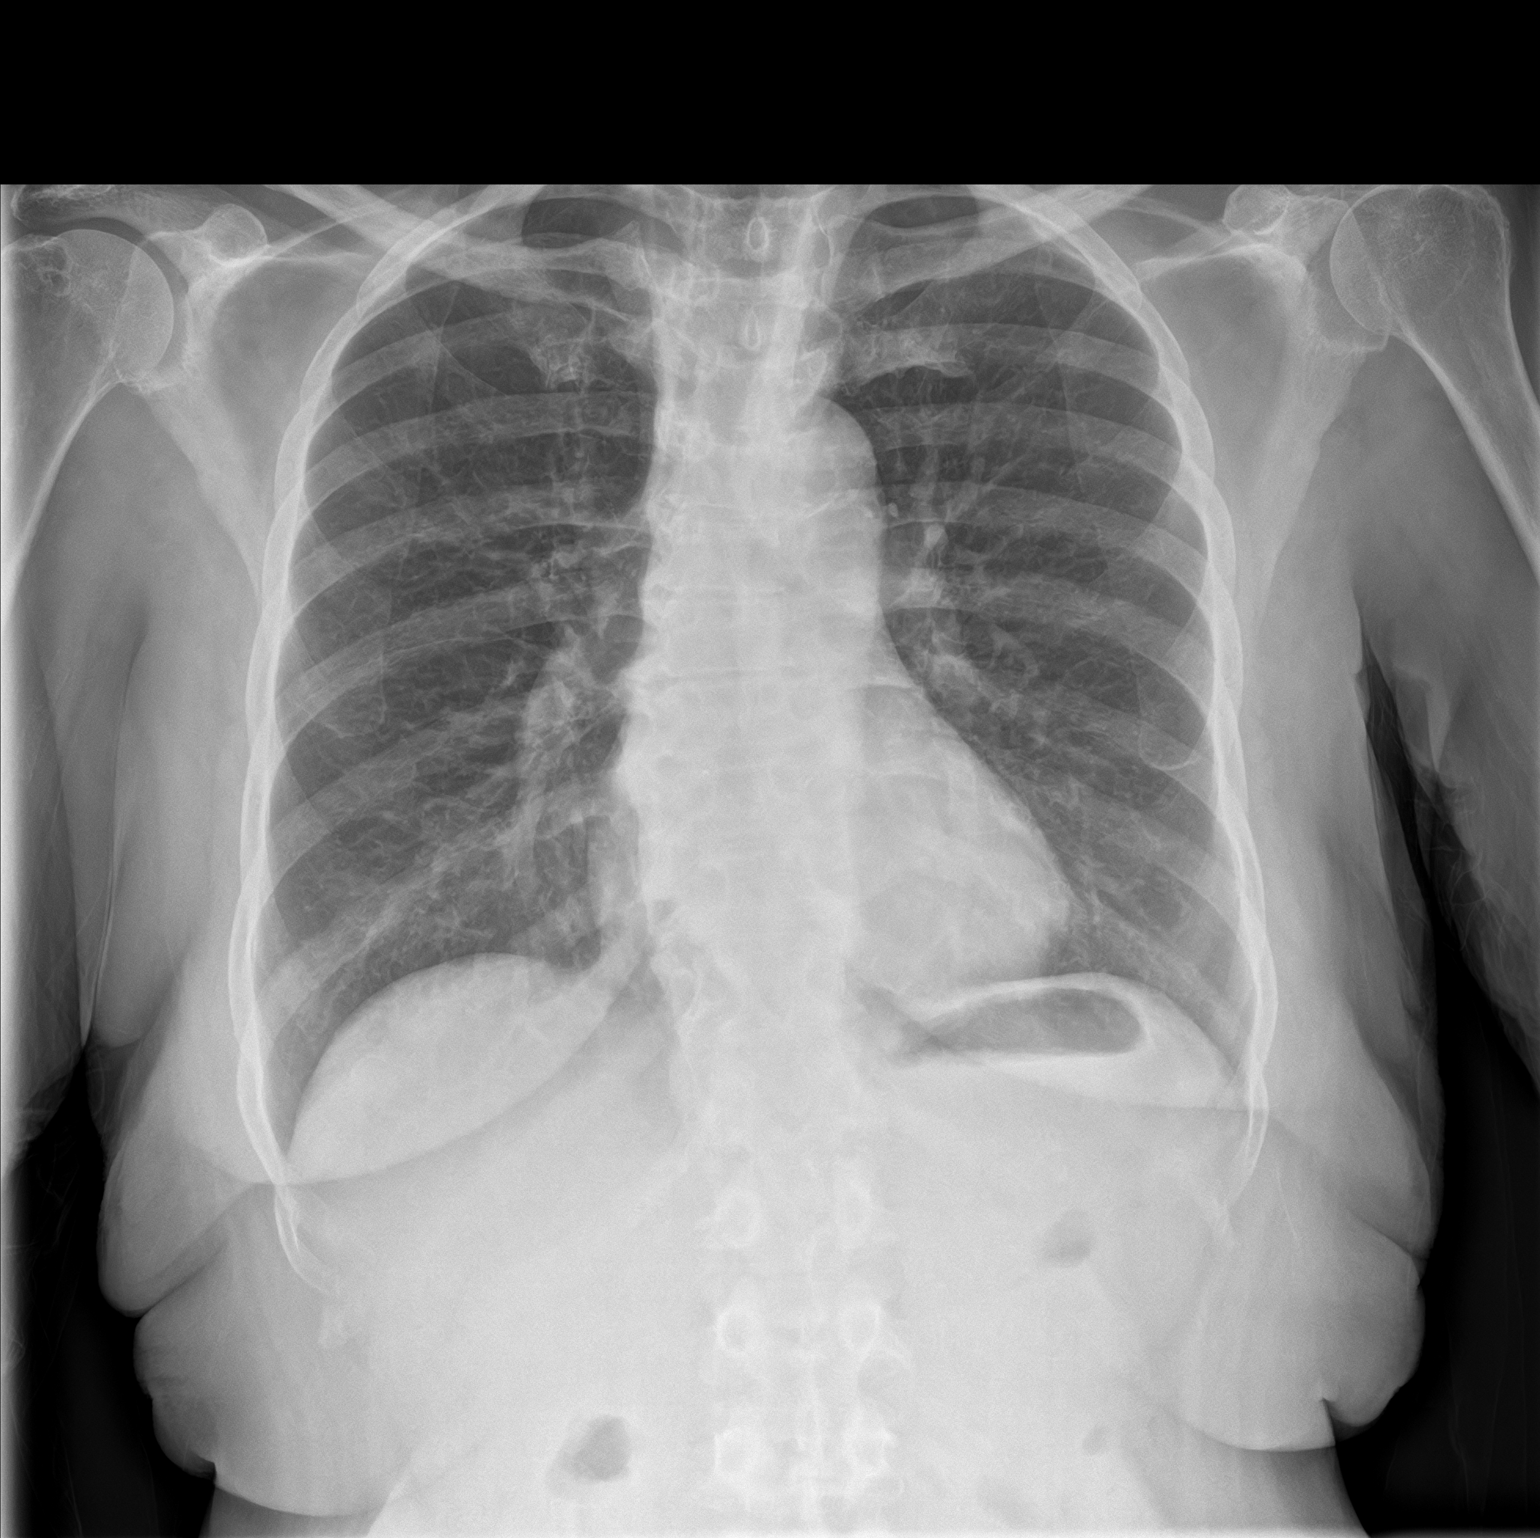
[im 2/2]
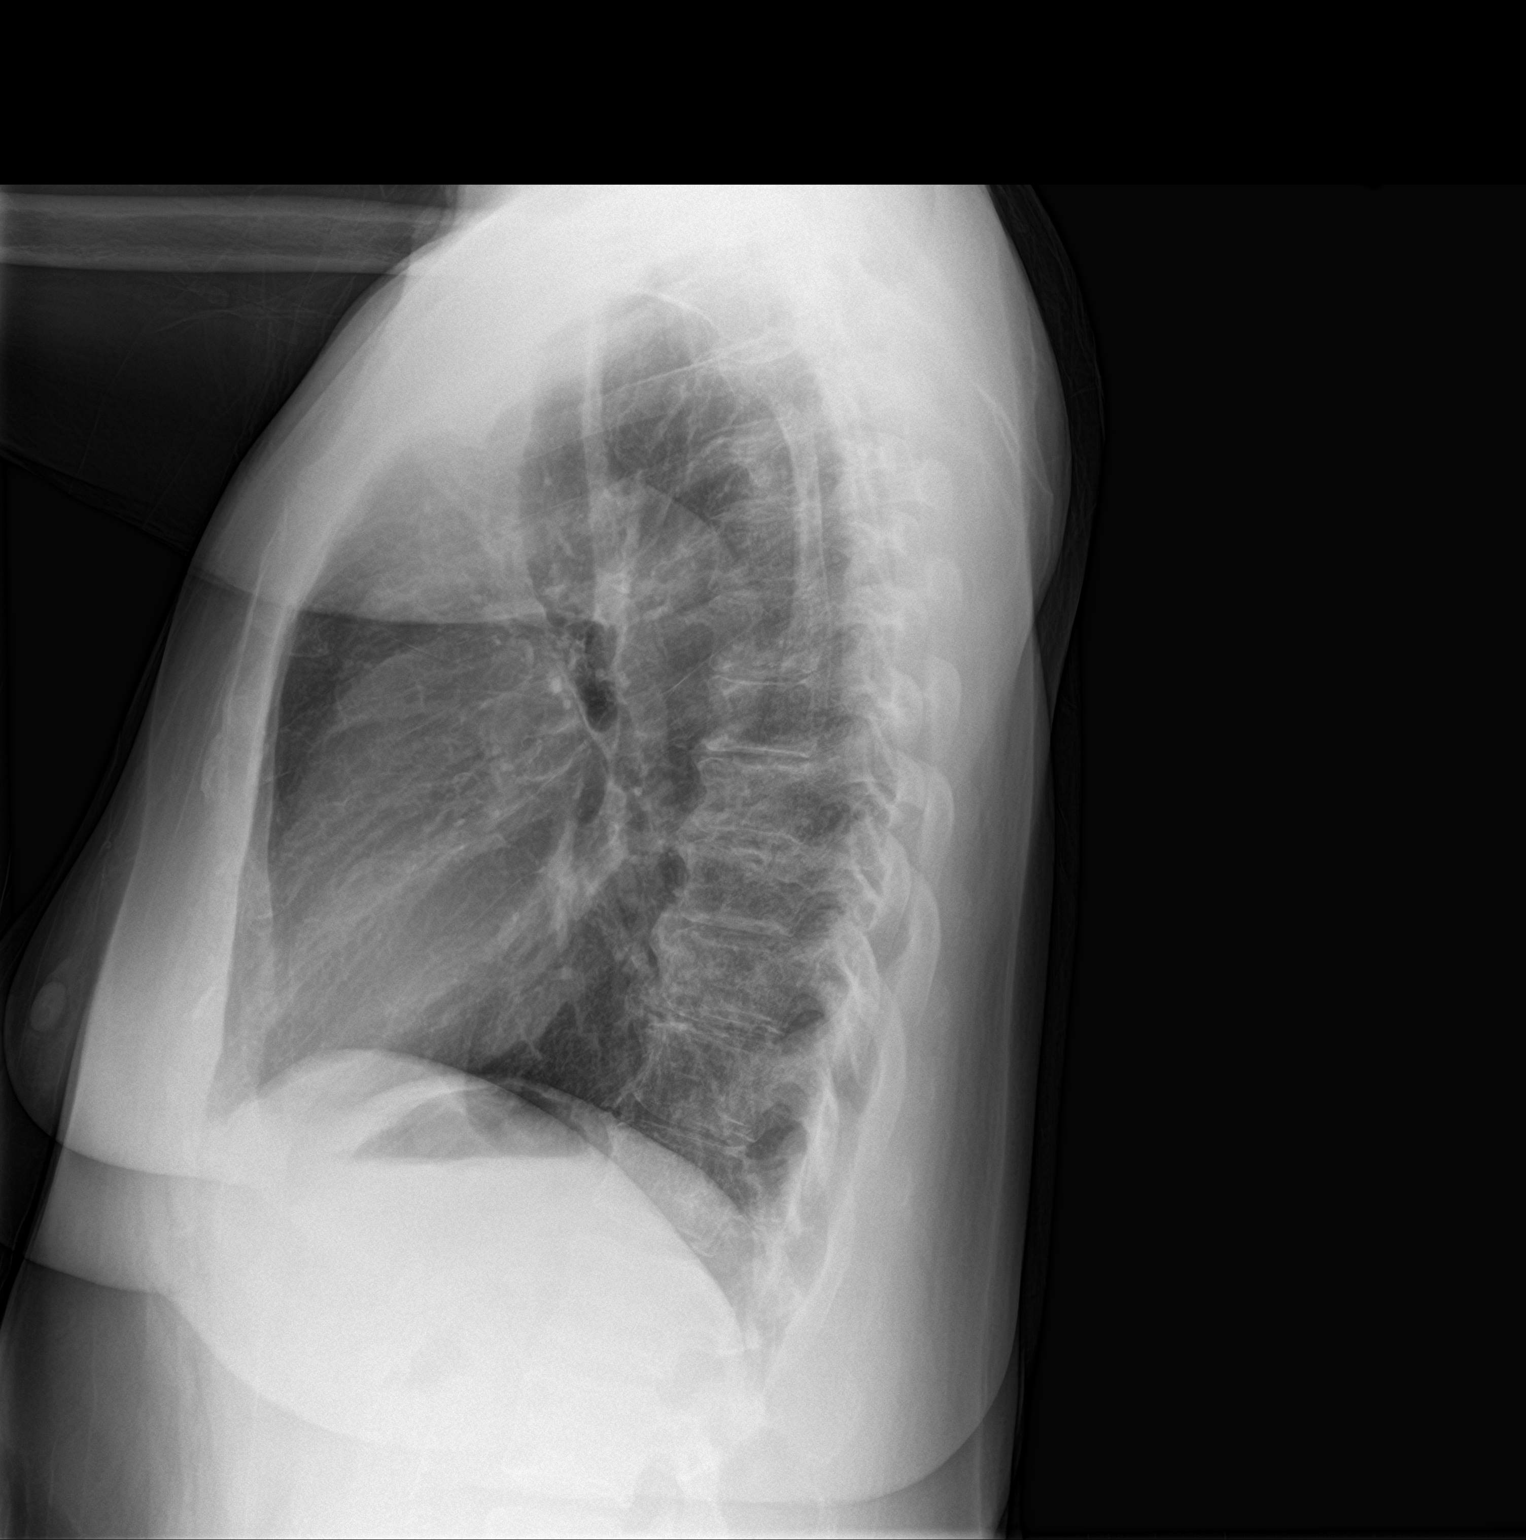

[2 of 2 positions shown; findings below may reference images not displayed]

FINDINGS: The heart size and mediastinal contours are within normal limits.
There is a focal patchy airspace opacity seen within the right lower
lung. The left lung is clear. No pleural effusion. Degenerative
changes seen in the midthoracic spine. No acute osseous findings.
IMPRESSION: Nonspecific patchy airspace opacity in the right lower lung. This
could be due to early atypical infectious etiology or atelectasis.

## 2019-03-04 MED ORDER — SODIUM CHLORIDE 0.9% FLUSH
3.0000 mL | Freq: Once | INTRAVENOUS | Status: AC
  Administered 2019-03-05: 3 mL via INTRAVENOUS

## 2019-03-04 NOTE — ED Triage Notes (Signed)
Pt to the er for pain all over. When asked to specify, pt states she hurts in her rib cage bilaterally. Pt state she had a bad fall 1 week ago. Pt says her leg went out and caused her to fall. Pt also reports nausea but no vomiting.

## 2019-03-05 ENCOUNTER — Inpatient Hospital Stay
Admission: EM | Admit: 2019-03-05 | Discharge: 2019-03-07 | DRG: 640 | Disposition: A | Discharge status: Home Health | Payer: Medicaid Other | Attending: Internal Medicine | Admitting: Internal Medicine

## 2019-03-05 DIAGNOSIS — F17210 Nicotine dependence, cigarettes, uncomplicated: Secondary | ICD-10-CM | POA: Diagnosis present

## 2019-03-05 DIAGNOSIS — K222 Esophageal obstruction: Secondary | ICD-10-CM | POA: Diagnosis present

## 2019-03-05 DIAGNOSIS — R079 Chest pain, unspecified: Secondary | ICD-10-CM

## 2019-03-05 DIAGNOSIS — J189 Pneumonia, unspecified organism: Secondary | ICD-10-CM | POA: Diagnosis present

## 2019-03-05 DIAGNOSIS — E119 Type 2 diabetes mellitus without complications: Secondary | ICD-10-CM | POA: Diagnosis present

## 2019-03-05 DIAGNOSIS — F1010 Alcohol abuse, uncomplicated: Secondary | ICD-10-CM | POA: Diagnosis present

## 2019-03-05 DIAGNOSIS — Z23 Encounter for immunization: Secondary | ICD-10-CM | POA: Diagnosis not present

## 2019-03-05 DIAGNOSIS — E871 Hypo-osmolality and hyponatremia: Secondary | ICD-10-CM | POA: Diagnosis not present

## 2019-03-05 DIAGNOSIS — W1830XA Fall on same level, unspecified, initial encounter: Secondary | ICD-10-CM | POA: Diagnosis present

## 2019-03-05 DIAGNOSIS — R778 Other specified abnormalities of plasma proteins: Secondary | ICD-10-CM

## 2019-03-05 DIAGNOSIS — E876 Hypokalemia: Secondary | ICD-10-CM | POA: Diagnosis present

## 2019-03-05 DIAGNOSIS — I10 Essential (primary) hypertension: Secondary | ICD-10-CM | POA: Diagnosis present

## 2019-03-05 DIAGNOSIS — Z20828 Contact with and (suspected) exposure to other viral communicable diseases: Secondary | ICD-10-CM | POA: Diagnosis present

## 2019-03-05 DIAGNOSIS — Z79899 Other long term (current) drug therapy: Secondary | ICD-10-CM | POA: Diagnosis not present

## 2019-03-05 LAB — TROPONIN I (HIGH SENSITIVITY)
Troponin I (High Sensitivity): 32 ng/L — ABNORMAL HIGH (ref <18)
Troponin I (High Sensitivity): 33 ng/L — ABNORMAL HIGH (ref <18)
Troponin I (High Sensitivity): 33 ng/L — ABNORMAL HIGH (ref <18)
Troponin I (High Sensitivity): 34 ng/L — ABNORMAL HIGH (ref <18)

## 2019-03-05 LAB — BASIC METABOLIC PANEL
Anion gap: 11 (ref 5–15)
Anion gap: 11 (ref 5–15)
BUN: <5 mg/dL — ABNORMAL LOW (ref 8–23)
BUN: <5 mg/dL — ABNORMAL LOW (ref 8–23)
CO2: 25 mmol/L (ref 22–32)
CO2: 25 mmol/L (ref 22–32)
Calcium: 8.2 mg/dL — ABNORMAL LOW (ref 8.9–10.3)
Calcium: 8.4 mg/dL — ABNORMAL LOW (ref 8.9–10.3)
Chloride: 85 mmol/L — ABNORMAL LOW (ref 98–111)
Chloride: 92 mmol/L — ABNORMAL LOW (ref 98–111)
Creatinine, Ser: 0.35 mg/dL — ABNORMAL LOW (ref 0.44–1.00)
Creatinine, Ser: 0.43 mg/dL — ABNORMAL LOW (ref 0.44–1.00)
GFR calc Af Amer: >60 mL/min (ref >60)
GFR calc Af Amer: >60 mL/min (ref >60)
GFR calc non Af Amer: >60 mL/min (ref >60)
GFR calc non Af Amer: >60 mL/min (ref >60)
Glucose, Bld: 110 mg/dL — ABNORMAL HIGH (ref 70–99)
Glucose, Bld: 99 mg/dL (ref 70–99)
Potassium: 2.9 mmol/L — ABNORMAL LOW (ref 3.5–5.1)
Potassium: 3.3 mmol/L — ABNORMAL LOW (ref 3.5–5.1)
Sodium: 121 mmol/L — ABNORMAL LOW (ref 135–145)
Sodium: 128 mmol/L — ABNORMAL LOW (ref 135–145)

## 2019-03-05 LAB — ETHANOL: Alcohol, Ethyl (B): <10 mg/dL (ref <10)

## 2019-03-05 LAB — TSH: TSH: 2.916 u[IU]/mL (ref 0.350–4.500)

## 2019-03-05 LAB — HEMOGLOBIN A1C
Hgb A1c MFr Bld: 5.1 % (ref 4.8–5.6)
Mean Plasma Glucose: 99.67 mg/dL

## 2019-03-05 LAB — SODIUM: Sodium: 124 mmol/L — ABNORMAL LOW (ref 135–145)

## 2019-03-05 LAB — LACTIC ACID, PLASMA: Lactic Acid, Venous: 1.4 mmol/L (ref 0.5–1.9)

## 2019-03-05 LAB — PHOSPHORUS: Phosphorus: 3.5 mg/dL (ref 2.5–4.6)

## 2019-03-05 LAB — SARS CORONAVIRUS 2 (TAT 6-24 HRS): SARS Coronavirus 2: NEGATIVE

## 2019-03-05 LAB — MAGNESIUM: Magnesium: 1.5 mg/dL — ABNORMAL LOW (ref 1.7–2.4)

## 2019-03-05 MED ORDER — ACETAMINOPHEN 650 MG RE SUPP: 650.0000 mg | Freq: Four times a day (QID) | RECTAL | Status: DC | PRN

## 2019-03-05 MED ORDER — SODIUM CHLORIDE 0.9 % IV BOLUS
1000.0000 mL | Freq: Once | INTRAVENOUS | Status: AC
  Administered 2019-03-05: 1000 mL via INTRAVENOUS

## 2019-03-05 MED ORDER — PANTOPRAZOLE SODIUM 40 MG PO TBEC
40.0000 mg | DELAYED_RELEASE_TABLET | Freq: Two times a day (BID) | ORAL | Status: DC
  Administered 2019-03-05 – 2019-03-07 (×6): 40 mg via ORAL
  Filled 2019-03-05 (×6): qty 1

## 2019-03-05 MED ORDER — MAGNESIUM SULFATE 2 GM/50ML IV SOLN
2.0000 g | Freq: Once | INTRAVENOUS | Status: AC
  Administered 2019-03-05: 09:00:00 2 g via INTRAVENOUS
  Filled 2019-03-05: qty 50

## 2019-03-05 MED ORDER — LISINOPRIL 10 MG PO TABS
10.0000 mg | ORAL_TABLET | Freq: Every day | ORAL | Status: DC
  Administered 2019-03-05 – 2019-03-07 (×3): 10 mg via ORAL
  Filled 2019-03-05 (×3): qty 1

## 2019-03-05 MED ORDER — FOLIC ACID 1 MG PO TABS
1.0000 mg | ORAL_TABLET | Freq: Every day | ORAL | Status: DC
  Administered 2019-03-05 – 2019-03-07 (×3): 1 mg via ORAL
  Filled 2019-03-05 (×3): qty 1

## 2019-03-05 MED ORDER — THIAMINE HCL 100 MG/ML IJ SOLN: 100.0000 mg | Freq: Every day | INTRAMUSCULAR | Status: DC

## 2019-03-05 MED ORDER — SODIUM CHLORIDE 0.9 % IV SOLN
500.0000 mg | Freq: Once | INTRAVENOUS | Status: AC
  Administered 2019-03-05: 500 mg via INTRAVENOUS
  Filled 2019-03-05: qty 500

## 2019-03-05 MED ORDER — ONDANSETRON HCL 4 MG PO TABS: 4.0000 mg | ORAL_TABLET | Freq: Four times a day (QID) | ORAL | Status: DC | PRN

## 2019-03-05 MED ORDER — LORAZEPAM 2 MG/ML IJ SOLN
1.0000 mg | INTRAMUSCULAR | Status: DC | PRN
  Administered 2019-03-07: 1 mg via INTRAVENOUS
  Filled 2019-03-05: qty 1

## 2019-03-05 MED ORDER — INFLUENZA VAC SPLIT QUAD 0.5 ML IM SUSY
0.5000 mL | PREFILLED_SYRINGE | INTRAMUSCULAR | Status: AC
  Administered 2019-03-07: 0.5 mL via INTRAMUSCULAR
  Filled 2019-03-05: qty 0.5

## 2019-03-05 MED ORDER — ADULT MULTIVITAMIN W/MINERALS CH
1.0000 | ORAL_TABLET | Freq: Every day | ORAL | Status: DC
  Administered 2019-03-05 – 2019-03-07 (×3): 1 via ORAL
  Filled 2019-03-05 (×3): qty 1

## 2019-03-05 MED ORDER — PNEUMOCOCCAL VAC POLYVALENT 25 MCG/0.5ML IJ INJ
0.5000 mL | INJECTION | INTRAMUSCULAR | Status: AC
  Administered 2019-03-07: 0.5 mL via INTRAMUSCULAR
  Filled 2019-03-05: qty 0.5

## 2019-03-05 MED ORDER — VITAMIN B-1 100 MG PO TABS
100.0000 mg | ORAL_TABLET | Freq: Every day | ORAL | Status: DC
  Administered 2019-03-05 – 2019-03-07 (×3): 100 mg via ORAL
  Filled 2019-03-05 (×3): qty 1

## 2019-03-05 MED ORDER — LORAZEPAM 2 MG/ML IJ SOLN: 0.0000 mg | Freq: Two times a day (BID) | INTRAMUSCULAR | Status: DC

## 2019-03-05 MED ORDER — ACETAMINOPHEN 325 MG PO TABS
650.0000 mg | ORAL_TABLET | Freq: Four times a day (QID) | ORAL | Status: DC | PRN
  Administered 2019-03-05 – 2019-03-06 (×4): 650 mg via ORAL
  Filled 2019-03-05 (×4): qty 2

## 2019-03-05 MED ORDER — LORAZEPAM 2 MG/ML IJ SOLN: 0.0000 mg | INTRAMUSCULAR | Status: AC

## 2019-03-05 MED ORDER — LORAZEPAM 2 MG/ML IJ SOLN
0.0000 mg | Freq: Four times a day (QID) | INTRAMUSCULAR | Status: DC
  Administered 2019-03-05: 1 mg via INTRAVENOUS
  Filled 2019-03-05: qty 1

## 2019-03-05 MED ORDER — DOCUSATE SODIUM 100 MG PO CAPS
100.0000 mg | ORAL_CAPSULE | Freq: Two times a day (BID) | ORAL | Status: DC
  Administered 2019-03-05 – 2019-03-07 (×5): 100 mg via ORAL
  Filled 2019-03-05 (×5): qty 1

## 2019-03-05 MED ORDER — SODIUM CHLORIDE 0.9 % IV SOLN
1.0000 g | Freq: Once | INTRAVENOUS | Status: AC
  Administered 2019-03-05: 1 g via INTRAVENOUS
  Filled 2019-03-05: qty 10

## 2019-03-05 MED ORDER — LORAZEPAM 1 MG PO TABS: 1.0000 mg | ORAL_TABLET | ORAL | Status: DC | PRN

## 2019-03-05 MED ORDER — ENOXAPARIN SODIUM 40 MG/0.4ML ~~LOC~~ SOLN
40.0000 mg | SUBCUTANEOUS | Status: DC
  Administered 2019-03-05 – 2019-03-07 (×3): 40 mg via SUBCUTANEOUS
  Filled 2019-03-05 (×3): qty 0.4

## 2019-03-05 MED ORDER — POTASSIUM CHLORIDE CRYS ER 20 MEQ PO TBCR
40.0000 meq | EXTENDED_RELEASE_TABLET | Freq: Once | ORAL | Status: AC
  Administered 2019-03-05: 03:00:00 40 meq via ORAL
  Filled 2019-03-05: qty 2

## 2019-03-05 MED ORDER — LABETALOL HCL 5 MG/ML IV SOLN
5.0000 mg | INTRAVENOUS | Status: DC | PRN
  Administered 2019-03-05: 10 mg via INTRAVENOUS
  Administered 2019-03-05: 05:00:00 5 mg via INTRAVENOUS
  Filled 2019-03-05 (×2): qty 4

## 2019-03-05 MED ORDER — DOXYCYCLINE HYCLATE 100 MG PO TABS
100.0000 mg | ORAL_TABLET | Freq: Two times a day (BID) | ORAL | Status: DC
  Administered 2019-03-05 – 2019-03-07 (×5): 100 mg via ORAL
  Filled 2019-03-05 (×5): qty 1

## 2019-03-05 MED ORDER — POTASSIUM CHLORIDE IN NACL 40-0.9 MEQ/L-% IV SOLN
INTRAVENOUS | Status: DC
  Administered 2019-03-05: 100 mL/h via INTRAVENOUS
  Administered 2019-03-05 – 2019-03-06 (×2): 75 mL/h via INTRAVENOUS
  Filled 2019-03-05 (×4): qty 1000

## 2019-03-05 MED ORDER — NITROGLYCERIN 2 % TD OINT
0.5000 [in_us] | TOPICAL_OINTMENT | Freq: Four times a day (QID) | TRANSDERMAL | Status: DC
  Administered 2019-03-05 – 2019-03-07 (×8): 0.5 [in_us] via TOPICAL
  Filled 2019-03-05 (×8): qty 1

## 2019-03-05 MED ORDER — ONDANSETRON HCL 4 MG/2ML IJ SOLN
4.0000 mg | Freq: Four times a day (QID) | INTRAMUSCULAR | Status: DC | PRN
  Administered 2019-03-07: 4 mg via INTRAVENOUS
  Filled 2019-03-05: qty 2

## 2019-03-05 NOTE — ED Notes (Signed)
Dr. Sung at the bedside for pt evaluation 

## 2019-03-05 NOTE — ED Provider Notes (Signed)
[facility] Emergency Department Provider Note   ____________________________________________   First MD Initiated Contact with Patient 03/05/19 0202     (approximate)  I have reviewed the triage vital signs and the nursing notes.   HISTORY  Chief Complaint Chest Pain    HPI Donna Aguirre is a 63 y.o. female who presents to the ED from home with a chief complaint of chest pain and generalized weakness.  Patient states she fell last week due to her leg going out.  Thinks she struck her right ribs because she has been hurting in her right rib cage.  Endorses nausea without vomiting.  Admits to daily beer drinking without history of DTs.  Denies fever, cough, shortness of breath, abdominal pain, vomiting, diarrhea.       Past Medical History:  Diagnosis Date  . Arthritis   . Diabetes mellitus without complication ([place])   . Gout   . Hypertension     Patient Active Problem List   Diagnosis Date Noted  . Hyponatremia 11/16/2018    History reviewed. No pertinent surgical history.  Prior to Admission medications   Medication Sig Start Date End Date Taking? Authorizing Provider  pantoprazole (PROTONIX) 40 MG tablet Take 1 tablet (40 mg total) by mouth 2 (two) times daily before a meal. 11/18/18   Gladstone Lighter, MD    Allergies Patient has no known allergies.  No family history on file.  Social History Social History   Tobacco Use  . Smoking status: Current Some Day Smoker    Types: Cigarettes  . Smokeless tobacco: Current User  Substance Use Topics  . Alcohol use: Yes    Comment: one 40 oz  . Drug use: No    Review of Systems  Constitutional: Positive for generalized malaise.  No fever/chills Eyes: No visual changes. ENT: No sore throat. Cardiovascular: Positive for chest pain. Respiratory: Denies shortness of breath. Gastrointestinal: No abdominal pain.  Positive for nausea, no vomiting.  No diarrhea.  No constipation.  Genitourinary: Negative for dysuria. Musculoskeletal: Negative for back pain. Skin: Negative for rash. Neurological: Negative for headaches, focal weakness or numbness.   ____________________________________________   PHYSICAL EXAM:  VITAL SIGNS: ED Triage Vitals [03/04/19 2325]  Enc Vitals Group     BP      Pulse      Resp      Temp      Temp src      SpO2      Weight 150 lb (68 kg)     Height 5\' 6"  (1.676 m)     Head Circumference      Peak Flow      Pain Score 10     Pain Loc      Pain Edu?      Excl. in [place]?     Constitutional: Alert and oriented.  Unkempt appearing and in no acute distress. Eyes: Conjunctivae are normal. PERRL. EOMI. Head: Atraumatic. Nose: Atraumatic. Mouth/Throat: Mucous membranes are moist.  No dental malocclusion. Neck: No stridor.  No cervical spine tenderness to palpation. Cardiovascular: Normal rate, regular rhythm. Grossly normal heart sounds.  Good peripheral circulation. Respiratory: Normal respiratory effort.  No retractions.  Splinting.  No crepitus.  Right lower lateral ribs tender to palpation.  Lungs diminished bilaterally but CTAB. Gastrointestinal: Soft and nontender to light or deep palpation. No distention. No abdominal bruits. No CVA tenderness. Musculoskeletal: No lower extremity tenderness nor edema.  No joint effusions. Neurologic:  Normal speech and  language. No gross focal neurologic deficits are appreciated.  Skin:  Skin is warm, dry and intact. No rash noted. Psychiatric: Mood and affect are normal. Speech and behavior are normal.  ____________________________________________   LABS (all labs ordered are listed, but only abnormal results are displayed)  Labs Reviewed  BASIC METABOLIC PANEL - Abnormal; Notable for the following components:      Result Value   Sodium 121 (*)    Potassium 2.9 (*)    Chloride 85 (*)    BUN <5 (*)    Creatinine, Ser 0.43 (*)    Calcium 8.4 (*)    All other components within normal  limits  CBC - Abnormal; Notable for the following components:   Hemoglobin 10.4 (*)    HCT 31.3 (*)    MCV 72.8 (*)    MCH 24.2 (*)    RDW 20.3 (*)    All other components within normal limits  TROPONIN I (HIGH SENSITIVITY) - Abnormal; Notable for the following components:   Troponin I (High Sensitivity) 33 (*)    All other components within normal limits  CULTURE, BLOOD (ROUTINE X 2)  CULTURE, BLOOD (ROUTINE X 2)  URINE CULTURE  SARS CORONAVIRUS 2 (TAT 6-24 HRS)  LACTIC ACID, PLASMA  LACTIC ACID, PLASMA  URINALYSIS, COMPLETE (UACMP) WITH MICROSCOPIC  ETHANOL  TROPONIN I (HIGH SENSITIVITY)   ____________________________________________  EKG  ED ECG REPORT I, Quanta Roher J, the attending physician, personally viewed and interpreted this ECG.   Date: 03/05/2019  EKG Time: 2324  Rate: 93  Rhythm: normal EKG, normal sinus rhythm  Axis: Normal  Intervals:none  ST&T Change: Nonspecific  ____________________________________________  RADIOLOGY  ED MD interpretation:  RLL patchy airspace opacity  Official radiology report(s): Dg Chest 2 View  Result Date: 03/04/2019 CLINICAL DATA:  Chest pain EXAM: CHEST - 2 VIEW COMPARISON:  April 22, 2017 FINDINGS: The heart size and mediastinal contours are within normal limits. There is a focal patchy airspace opacity seen within the right lower lung. The left lung is clear. No pleural effusion. Degenerative changes seen in the midthoracic spine. No acute osseous findings. IMPRESSION: Nonspecific patchy airspace opacity in the right lower lung. This could be due to early atypical infectious etiology or atelectasis. Electronically Signed   By: Prudencio Pair M.D.   On: 03/04/2019 23:49    ____________________________________________   PROCEDURES  Procedure(s) performed (including Critical Care):  Procedures   ____________________________________________   INITIAL IMPRESSION / ASSESSMENT AND PLAN / ED COURSE  As part of my  medical decision making, I reviewed the following data within the [place] notes reviewed and incorporated, Labs reviewed, EKG interpreted, Old chart reviewed, Radiograph reviewed, Discussed with admitting physician Dr. Marcille Blanco and Notes from prior ED visits     [name] was evaluated in Emergency Department on 03/05/2019 for the symptoms described in the history of present illness. She was evaluated in the context of the global COVID-19 pandemic, which necessitated consideration that the patient might be at risk for infection with the SARS-CoV-2 virus that causes COVID-19. Institutional protocols and algorithms that pertain to the evaluation of patients at risk for COVID-19 are in a state of rapid change based on information released by regulatory bodies including the CDC and federal and state organizations. These policies and algorithms were followed during the patient's care in the ED.    63 year old female who presents with right lower rib pain, chest pain and malaise. Differential diagnosis includes, but is not  limited to, ACS, aortic dissection, pulmonary embolism, cardiac tamponade, pneumothorax, pneumonia, pericarditis, myocarditis, GI-related causes including esophagitis/gastritis, and musculoskeletal chest wall pain.    Laboratory results demonstrate hyponatremia lowest in the past 3 months, hypokalemia and elevated troponin.  IV antibiotics ordered for right lower lobe patchy opacity likely pneumonia status post fall.  Discussed with hospitalist to evaluate patient in the emergency department for admission.      ____________________________________________   FINAL CLINICAL IMPRESSION(S) / ED DIAGNOSES  Final diagnoses:  Nonspecific chest pain  Elevated troponin  Hyponatremia  Hypokalemia     ED Discharge Orders    None       Note:  This document was prepared using Dragon voice recognition software and may include unintentional dictation  errors.   Paulette Blanch, MD 03/05/19 [phone]

## 2019-03-05 NOTE — Progress Notes (Signed)
The patient has no complaints.  Vital signs and lab reviewed. Hypokalemia and hyponatremia each eye improving. Continue current treatment.  Follow cardiologist recommendation.  Discussed with the patient and RN.

## 2019-03-05 NOTE — Progress Notes (Signed)
Unable to complete admission documentation at this time, pt is A&Ox3 at times and not willing to answer some questions.

## 2019-03-05 NOTE — Plan of Care (Signed)
Pt denies CP, NV, SOB.  Pt is drowsy and falls asleep during conversations.   Problem: Education: Goal: Knowledge of General Education information will improve Description: Including pain rating scale, medication(s)/side effects and non-pharmacologic comfort measures Outcome: Progressing   Problem: Health Behavior/Discharge Planning: Goal: Ability to manage health-related needs will improve Outcome: Progressing   Problem: Clinical Measurements: Goal: Ability to maintain clinical measurements within normal limits will improve Outcome: Progressing Goal: Will remain free from infection Outcome: Progressing Goal: Diagnostic test results will improve Outcome: Progressing Goal: Respiratory complications will improve Outcome: Progressing Goal: Cardiovascular complication will be avoided Outcome: Progressing   Problem: Activity: Goal: Risk for activity intolerance will decrease Outcome: Progressing   Problem: Nutrition: Goal: Adequate nutrition will be maintained Outcome: Progressing   Problem: Coping: Goal: Level of anxiety will decrease Outcome: Progressing   Problem: Elimination: Goal: Will not experience complications related to bowel motility Outcome: Progressing Goal: Will not experience complications related to urinary retention Outcome: Progressing   Problem: Pain Managment: Goal: General experience of comfort will improve Outcome: Progressing   Problem: Safety: Goal: Ability to remain free from injury will improve Outcome: Progressing   Problem: Skin Integrity: Goal: Risk for impaired skin integrity will decrease Outcome: Progressing

## 2019-03-05 NOTE — H&P (Signed)
Donna Aguirre is an 63 y.o. female.   Chief Complaint: Chest pain HPI: The patient with past medical history of hypertension and diabetes presents to the emergency department complaining of chest pain.  The patient reports a fall last week.  She denies loss of consciousness or trauma to her head.  She admits that she drinks alcohol does not have a clear recollection of the details of her chest pain but points to her ribs as the source of pain.  Chest x-ray shows no fractures but was concerning for a right lower lobe infiltrate.  The patient denies fever but admits to nausea but no vomiting, shortness of breath or diaphoresis.  She was given ceftriaxone and azithromycin.  Laboratory evaluation also revealed elevated troponin although her EKG showed no signs of ischemia.  Due to ongoing chest pain as well as uncontrolled blood pressure the emergency department staff called the hospitalist service for admission.  Past Medical History:  Diagnosis Date  . Arthritis   . Diabetes mellitus without complication ([place])   . Gout   . Hypertension     History reviewed. No pertinent surgical history. None  No family history on file.  Patient does not seem to remember or is not focused on examiner's questions due to distress secondary to chest pain  Social History:  reports that she has been smoking cigarettes. She uses smokeless tobacco. She reports current alcohol use. She reports that she does not use drugs.  Allergies: No Known Allergies  No medications prior to admission.    Results for orders placed or performed during the hospital encounter of 03/05/19 (from the past 48 hour(s))  Basic metabolic panel     Status: Abnormal   Collection Time: 03/04/19 11:30 PM  Result Value Ref Range   Sodium 121 (L) 135 - 145 mmol/L   Potassium 2.9 (L) 3.5 - 5.1 mmol/L   Chloride 85 (L) 98 - 111 mmol/L   CO2 25 22 - 32 mmol/L   Glucose, Bld 99 70 - 99 mg/dL   BUN <5 (L) 8 - 23 mg/dL   Creatinine, Ser 0.43  (L) 0.44 - 1.00 mg/dL   Calcium 8.4 (L) 8.9 - 10.3 mg/dL   GFR calc non Af Amer >60 >60 mL/min   GFR calc Af Amer >60 >60 mL/min   Anion gap 11 5 - 15    Comment: Performed at [facility], [address] [place], [place] [address]  CBC     Status: Abnormal   Collection Time: 03/04/19 11:30 PM  Result Value Ref Range   WBC 7.9 4.0 - 10.5 K/uL   RBC 4.30 3.87 - 5.11 MIL/uL   Hemoglobin 10.4 (L) 12.0 - 15.0 g/dL   HCT 31.3 (L) 36.0 - 46.0 %   MCV 72.8 (L) 80.0 - 100.0 fL   MCH 24.2 (L) 26.0 - 34.0 pg   MCHC 33.2 30.0 - 36.0 g/dL   RDW 20.3 (H) 11.5 - 15.5 %   Platelets 270 150 - 400 K/uL   nRBC 0.0 0.0 - 0.2 %    Comment: Performed at [facility], [place], [place] [address]  Troponin I (High Sensitivity)     Status: Abnormal   Collection Time: 03/04/19 11:30 PM  Result Value Ref Range   Troponin I (High Sensitivity) 33 (H) <18 ng/L    Comment: (NOTE) Elevated high sensitivity troponin I (hsTnI) values and significant  changes across serial measurements may suggest ACS but many other  chronic and acute conditions are known to elevate hsTnI results.  Refer to the "Links" section for chest pain algorithms and additional  guidance. Performed at [facility], [address] [place], [place] [address]   Lactic acid, plasma     Status: None   Collection Time: 03/05/19  2:15 AM  Result Value Ref Range   Lactic Acid, Venous 1.4 0.5 - 1.9 mmol/L    Comment: Performed at [facility], [address] [place], [place] [address]  Ethanol     Status: None   Collection Time: 03/05/19  2:15 AM  Result Value Ref Range   Alcohol, Ethyl (B) <10 <10 mg/dL    Comment: (NOTE) Lowest detectable limit for serum alcohol is 10 mg/dL. For medical purposes only. Performed at [facility], [address] [place], [place] [address]    Dg Chest 2 View  Result Date: 03/04/2019 CLINICAL DATA:  Chest pain EXAM: CHEST - 2 VIEW  COMPARISON:  April 22, 2017 FINDINGS: The heart size and mediastinal contours are within normal limits. There is a focal patchy airspace opacity seen within the right lower lung. The left lung is clear. No pleural effusion. Degenerative changes seen in the midthoracic spine. No acute osseous findings. IMPRESSION: Nonspecific patchy airspace opacity in the right lower lung. This could be due to early atypical infectious etiology or atelectasis. Electronically Signed   By: Prudencio Pair M.D.   On: 03/04/2019 23:49    Review of Systems  Constitutional: Negative for chills and fever.  HENT: Negative for sore throat and tinnitus.   Eyes: Negative for blurred vision and redness.  Respiratory: Negative for cough and shortness of breath.   Cardiovascular: Positive for chest pain. Negative for palpitations, orthopnea and PND.  Gastrointestinal: Negative for abdominal pain, diarrhea, nausea and vomiting.  Genitourinary: Negative for dysuria, frequency and urgency.  Musculoskeletal: Negative for joint pain and myalgias.  Skin: Negative for rash.       No lesions  Neurological: Negative for speech change, focal weakness and weakness.  Endo/Heme/Allergies: Does not bruise/bleed easily.       No temperature intolerance  Psychiatric/Behavioral: Negative for depression and suicidal ideas.    Blood pressure (!) 173/74, pulse 87, temperature (!) 97.5 F (36.4 C), temperature source Oral, resp. rate 13, height 5\' 6"  (1.676 m), weight 68 kg, SpO2 100 %. Physical Exam  Vitals reviewed. Constitutional: She is oriented to person, place, and time. She appears well-developed and well-nourished. No distress.  HENT:  Head: Normocephalic and atraumatic.  Mouth/Throat: Oropharynx is clear and moist.  Eyes: Pupils are equal, round, and reactive to light. Conjunctivae and EOM are normal. No scleral icterus.  Neck: Normal range of motion. Neck supple. No JVD present. No tracheal deviation present. No thyromegaly  present.  Cardiovascular: Normal rate, regular rhythm and normal heart sounds. Exam reveals no gallop and no friction rub.  No murmur heard. Respiratory: Effort normal and breath sounds normal.  GI: Soft. Bowel sounds are normal. She exhibits no distension. There is no abdominal tenderness.  Genitourinary:    Genitourinary Comments: Deferred   Musculoskeletal: Normal range of motion.        General: No edema.  Lymphadenopathy:    She has no cervical adenopathy.  Neurological: She is alert and oriented to person, place, and time. No cranial nerve deficit. She exhibits normal muscle tone.  Skin: Skin is warm and dry. No rash noted. No erythema.  Psychiatric: She has a normal mood and affect.  Her behavior is normal. Judgment and thought content normal.     Assessment/Plan This is a 63 year old female admitted for chest pain. 1.  Chest pain: Atypical yet ongoing.  Differential diagnosis includes hypertensive urgency as the patient's blood pressure is grossly elevated.  I have given her dose of labetalol and applied Nitropaste to her chest.  Second troponin is elevated.  EKG is nonischemic.  Continue to follow cardiac biomarkers.  Monitor telemetry.  Consult cardiology. 2.  Hypertension: Uncontrolled; start lisinopril. 3.  Community-acquired pneumonia: The patient does not meet criteria for sepsis.  Start doxycycline. 4.  Alcohol abuse: Ongoing; elevated blood pressure may be secondary to withdrawal as well.  Start CIWA.  Ativan as needed. 5.  DVT prophylaxis: Lovenox 6.  GI prophylaxis: None Patient is a full code.  Time spent on admission orders and patient care approximately 45 minutes  Harrie Foreman, MD 03/05/2019, 3:17 AM

## 2019-03-05 NOTE — ED Notes (Signed)
ED TO INPATIENT HANDOFF REPORT  ED Nurse Name and Phone #: Rileigh Kawashima#[phone]  S Name/Age/Gender Donna Aguirre 63 y.o. female Room/Bed: ED19A/ED19A  Code Status   Code Status: Prior  Home/SNF/Other Home Patient oriented to: self, place, time and situation Is this baseline? Yes   Triage Complete: Triage complete  Chief Complaint Pain All Over  Triage Note Pt to the er for pain all over. When asked to specify, pt states she hurts in her rib cage bilaterally. Pt state she had a bad fall 1 week ago. Pt says her leg went out and caused her to fall. Pt also reports nausea but no vomiting.    Allergies No Known Allergies  Level of Care/Admitting Diagnosis ED Disposition    ED Disposition Condition [facility] Area: [place] [[id number]]  Level of Care: Telemetry [5]  Covid Evaluation: Person Under Investigation (PUI)  Diagnosis: Hyponatremia [id number]  Admitting Physician: Harrie Foreman [id number]  Attending Physician: Harrie Foreman [id number]  Estimated length of stay: past midnight tomorrow  Certification:: I certify this patient will need inpatient services for at least 2 midnights  PT Class (Do Not Modify): Inpatient [101]  PT Acc Code (Do Not Modify): Private [1]       B Medical/Surgery History Past Medical History:  Diagnosis Date  . Arthritis   . Diabetes mellitus without complication ([place])   . Gout   . Hypertension    History reviewed. No pertinent surgical history.   A IV Location/Drains/Wounds Patient Lines/Drains/Airways Status   Active Line/Drains/Airways    Name:   Placement date:   Placement time:   Site:   Days:   Peripheral IV 03/05/19 Left Hand   03/05/19    0230    Hand   less than 1   Peripheral IV 03/05/19 Left Antecubital   03/05/19    0250    Antecubital   less than 1          Intake/Output Last 24 hours No intake or output data in the 24 hours ending 03/05/19 [id number]  Labs/Imaging Results for  orders placed or performed during the hospital encounter of 03/05/19 (from the past 48 hour(s))  Basic metabolic panel     Status: Abnormal   Collection Time: 03/04/19 11:30 PM  Result Value Ref Range   Sodium 121 (L) 135 - 145 mmol/L   Potassium 2.9 (L) 3.5 - 5.1 mmol/L   Chloride 85 (L) 98 - 111 mmol/L   CO2 25 22 - 32 mmol/L   Glucose, Bld 99 70 - 99 mg/dL   BUN <5 (L) 8 - 23 mg/dL   Creatinine, Ser 0.43 (L) 0.44 - 1.00 mg/dL   Calcium 8.4 (L) 8.9 - 10.3 mg/dL   GFR calc non Af Amer >60 >60 mL/min   GFR calc Af Amer >60 >60 mL/min   Anion gap 11 5 - 15    Comment: Performed at [facility], [address] [place], [place] [address]  CBC     Status: Abnormal   Collection Time: 03/04/19 11:30 PM  Result Value Ref Range   WBC 7.9 4.0 - 10.5 K/uL   RBC 4.30 3.87 - 5.11 MIL/uL   Hemoglobin 10.4 (L) 12.0 - 15.0 g/dL   HCT 31.3 (L) 36.0 - 46.0 %   MCV 72.8 (L) 80.0 - 100.0 fL   MCH 24.2 (L) 26.0 - 34.0 pg   MCHC 33.2 30.0 - 36.0 g/dL   RDW 20.3 (H)  11.5 - 15.5 %   Platelets 270 150 - 400 K/uL   nRBC 0.0 0.0 - 0.2 %    Comment: Performed at [facility], [place], [place] [address]  Troponin I (High Sensitivity)     Status: Abnormal   Collection Time: 03/04/19 11:30 PM  Result Value Ref Range   Troponin I (High Sensitivity) 33 (H) <18 ng/L    Comment: (NOTE) Elevated high sensitivity troponin I (hsTnI) values and significant  changes across serial measurements may suggest ACS but many other  chronic and acute conditions are known to elevate hsTnI results.  Refer to the "Links" section for chest pain algorithms and additional  guidance. Performed at [facility], [place], [place] [address]   Troponin I (High Sensitivity)     Status: Abnormal   Collection Time: 03/05/19  2:15 AM  Result Value Ref Range   Troponin I (High Sensitivity) 34 (H) <18 ng/L    Comment: (NOTE) Elevated high sensitivity troponin I (hsTnI)  values and significant  changes across serial measurements may suggest ACS but many other  chronic and acute conditions are known to elevate hsTnI results.  Refer to the "Links" section for chest pain algorithms and additional  guidance. Performed at [facility], [address] [place], [place] [address]   Lactic acid, plasma     Status: None   Collection Time: 03/05/19  2:15 AM  Result Value Ref Range   Lactic Acid, Venous 1.4 0.5 - 1.9 mmol/L    Comment: Performed at [facility], [address] [place], [place] [address]  Ethanol     Status: None   Collection Time: 03/05/19  2:15 AM  Result Value Ref Range   Alcohol, Ethyl (B) <10 <10 mg/dL    Comment: (NOTE) Lowest detectable limit for serum alcohol is 10 mg/dL. For medical purposes only. Performed at [facility], [address] [place], [place] [address]    Dg Chest 2 View  Result Date: 03/04/2019 CLINICAL DATA:  Chest pain EXAM: CHEST - 2 VIEW COMPARISON:  April 22, 2017 FINDINGS: The heart size and mediastinal contours are within normal limits. There is a focal patchy airspace opacity seen within the right lower lung. The left lung is clear. No pleural effusion. Degenerative changes seen in the midthoracic spine. No acute osseous findings. IMPRESSION: Nonspecific patchy airspace opacity in the right lower lung. This could be due to early atypical infectious etiology or atelectasis. Electronically Signed   By: Prudencio Pair M.D.   On: 03/04/2019 23:49    Pending Labs Unresulted Labs (From admission, onward)    Start     Ordered   03/05/19 0211  SARS CORONAVIRUS 2 (TAT 6-24 HRS) Nasopharyngeal Nasopharyngeal Swab  (Asymptomatic/Tier 2 Patients Labs)  Once,   STAT    Question Answer Comment  Is this test for diagnosis or screening Screening   Symptomatic for COVID-19 as defined by CDC No   Hospitalized for COVID-19 No   Admitted to ICU for COVID-19 No   Previously tested for COVID-19 No    Resident in a congregate (group) care setting No   Employed in healthcare setting No   Pregnant No      03/05/19 0210   03/05/19 0205  Culture, blood (routine x 2)  BLOOD CULTURE X 2,   STAT     03/05/19 0205   03/05/19 0205  Lactic acid, plasma  Now then every 2 hours,  STAT     03/05/19 0205   03/05/19 0205  Urinalysis, Complete w Microscopic  ONCE - STAT,   STAT     03/05/19 0205   03/05/19 0205  Urine culture  Once,   STAT     03/05/19 0205   Signed and Held  Creatinine, serum  (enoxaparin (LOVENOX)    CrCl >/= 30 ml/min)  Weekly,   R    Comments: while on enoxaparin therapy    Signed and Held   Signed and Held  TSH  Add-on,   R     Signed and Held   Signed and Held  Hemoglobin A1c  Add-on,   R     Signed and Held   Signed and Held  Magnesium  Once,   R     Signed and Held   Signed and Held  Phosphorus  Once,   R     Signed and Held   Signed and Held  Sodium  Now then every 8 hours,   R     Signed and Held   Signed and Held  Sodium  Now then every 12 hours,   R     Signed and Held   Signed and Held  Basic metabolic panel  Daily,   STAT     Signed and Held          Vitals/Pain Today's Vitals   03/04/19 2325 03/05/19 0230 03/05/19 0315  BP:  (!) 173/74 (!) 212/89  Pulse:  87 92  Resp:  13   Temp:  (!) 97.5 F (36.4 C)   TempSrc:  Oral   SpO2:  100%   Weight: 68 kg    Height: 5\' 6"  (1.676 m)    PainSc: 10-Worst pain ever 10-Worst pain ever     Isolation Precautions No active isolations  Medications Medications  sodium chloride flush (NS) 0.9 % injection 3 mL (has no administration in time range)  azithromycin (ZITHROMAX) 500 mg in sodium chloride 0.9 % 250 mL IVPB (500 mg Intravenous New Bag/Given 03/05/19 0324)  thiamine (B-1) injection 100 mg (has no administration in time range)  LORazepam (ATIVAN) injection 0-4 mg (has no administration in time range)  doxycycline (VIBRA-TABS) tablet 100 mg (has no administration in time range)  sodium chloride 0.9  % bolus 1,000 mL (1,000 mLs Intravenous New Bag/Given 03/05/19 0252)  cefTRIAXone (ROCEPHIN) 1 g in sodium chloride 0.9 % 100 mL IVPB (0 g Intravenous Stopped 03/05/19 0325)  potassium chloride SA (KLOR-CON) CR tablet 40 mEq (40 mEq Oral Given 03/05/19 0256)    Mobility walks with device Moderate fall risk   Focused Assessments Cardiac Assessment Handoff:    Lab Results  Component Value Date   TROPONINI <0.03 10/16/2018   No results found for: DDIMER Does the Patient currently have chest pain? No     R Recommendations: See Admitting Provider Note  Report given to:   Additional Notes: n/a

## 2019-03-05 NOTE — Consult Note (Signed)
[facility] Cardiology  CARDIOLOGY CONSULT NOTE  Patient ID: Donna Aguirre MRN: [id number] DOB/AGE: September 19, 1955 63 y.o.  Admit date: 03/05/2019 Referring Physician Dr. Marcille Blanco  Primary Physician N/A Primary Cardiologist N/A Reason for Consultation Chest pain, elevated troponin   HPI: Donna Aguirre is a 63 year old female with a past medical history significant for hypertension who presented to the ED on 03/04/19 after a mechanical fall.  Reports that her legs gave out on her, but denied any precipitating symptoms such as dizziness or lightheadedness.  While in the ED, she admitted to vague, generalized chest pain, which has since resolved.  Workup in the ED included ECG which was negative for acute ischemic changes, troponin borderline elevated at 34, 32 respectively, and chest xray revealing a nonspecific opacity in the right lower lung, representing atypical infection or atelectasis.  Sodium was severely low at 121.   Donna Aguirre is a somewhat poor historian, but denies any recurrent chest pain.  Also denies shortness of breath, palpitations, orthopnea, or PND.  She denies any prior cardiac history.    Review of systems complete and found to be negative unless listed above     Past Medical History:  Diagnosis Date  . Arthritis   . Diabetes mellitus without complication ([place])   . Gout   . Hypertension     History reviewed. No pertinent surgical history.  No medications prior to admission.   Social History   Socioeconomic History  . Marital status: Single    Spouse name: Not on file  . Number of children: Not on file  . Years of education: Not on file  . Highest education level: Not on file  Occupational History  . Not on file  Social Needs  . Financial resource strain: Not on file  . Food insecurity    Worry: Not on file    Inability: Not on file  . Transportation needs    Medical: Not on file    Non-medical: Not on file  Tobacco Use  . Smoking status: Current Some Day Smoker   Types: Cigarettes  . Smokeless tobacco: Current User  Substance and Sexual Activity  . Alcohol use: Yes    Comment: one 40 oz  . Drug use: No  . Sexual activity: Not on file  Lifestyle  . Physical activity    Days per week: Not on file    Minutes per session: Not on file  . Stress: Not on file  Relationships  . Social Herbalist on phone: Not on file    Gets together: Not on file    Attends religious service: Not on file    Active member of club or organization: Not on file    Attends meetings of clubs or organizations: Not on file    Relationship status: Not on file  . Intimate partner violence    Fear of current or ex partner: Not on file    Emotionally abused: Not on file    Physically abused: Not on file    Forced sexual activity: Not on file  Other Topics Concern  . Not on file  Social History Narrative  . Not on file    No family history on file.    Review of systems complete and found to be negative unless listed above      PHYSICAL EXAM  General: Well developed, well nourished, in no acute distress HEENT:  Normocephalic and atramatic Neck:  No JVD.  Lungs: Clear bilaterally to  auscultation and percussion. Heart: HRRR . Normal S1 and S2 without gallops or murmurs.  Abdomen: Bowel sounds are positive, abdomen soft and non-tender  Msk:  Back normal. Normal strength and tone for age. Extremities: No clubbing, cyanosis or edema.   Neuro: Alert and oriented X 3. Psych:  Good affect, responds appropriately  Labs:   Lab Results  Component Value Date   WBC 7.9 03/04/2019   HGB 10.4 (L) 03/04/2019   HCT 31.3 (L) 03/04/2019   MCV 72.8 (L) 03/04/2019   PLT 270 03/04/2019    Recent Labs  Lab 03/04/19 2330  NA 121*  K 2.9*  CL 85*  CO2 25  BUN <5*  CREATININE 0.43*  CALCIUM 8.4*  GLUCOSE 99   Lab Results  Component Value Date   TROPONINI <0.03 10/16/2018   No results found for: CHOL No results found for: HDL No results found for:  LDLCALC No results found for: TRIG No results found for: CHOLHDL No results found for: LDLDIRECT    Radiology: Dg Chest 2 View  Result Date: 03/04/2019 CLINICAL DATA:  Chest pain EXAM: CHEST - 2 VIEW COMPARISON:  April 22, 2017 FINDINGS: The heart size and mediastinal contours are within normal limits. There is a focal patchy airspace opacity seen within the right lower lung. The left lung is clear. No pleural effusion. Degenerative changes seen in the midthoracic spine. No acute osseous findings. IMPRESSION: Nonspecific patchy airspace opacity in the right lower lung. This could be due to early atypical infectious etiology or atelectasis. Electronically Signed   By: Prudencio Pair M.D.   On: 03/04/2019 23:49    EKG: Normal sinus rhythm without evidence of acute ischemic changes, arrhythmias, or ectopy   ASSESSMENT AND PLAN:  1.  Chest pain   -Following a mechanical fall, has since resolved  2.  Elevated troponin   -Borderline elevated, flat; in the absence of ECG abnormalities and no evidence of recurrent chest pain, will defer further ischemic workup at this time   -Likely demand ischemia in the setting of hyponatremia, possible infection   The history, physical exam findings, and plan of care were all discussed with Dr. Bartholome Bill, and all decision making was made in collaboration.    Signed: Avie Arenas PA-C 03/05/2019, 7:43 AM

## 2019-03-06 LAB — BASIC METABOLIC PANEL
Anion gap: 5 (ref 5–15)
BUN: <5 mg/dL — ABNORMAL LOW (ref 8–23)
CO2: 25 mmol/L (ref 22–32)
Calcium: 8.4 mg/dL — ABNORMAL LOW (ref 8.9–10.3)
Chloride: 101 mmol/L (ref 98–111)
Creatinine, Ser: 0.47 mg/dL (ref 0.44–1.00)
GFR calc Af Amer: >60 mL/min (ref >60)
GFR calc non Af Amer: >60 mL/min (ref >60)
Glucose, Bld: 133 mg/dL — ABNORMAL HIGH (ref 70–99)
Potassium: 4.1 mmol/L (ref 3.5–5.1)
Sodium: 131 mmol/L — ABNORMAL LOW (ref 135–145)

## 2019-03-06 LAB — MAGNESIUM: Magnesium: 1.7 mg/dL (ref 1.7–2.4)

## 2019-03-06 LAB — SODIUM
Sodium: 130 mmol/L — ABNORMAL LOW (ref 135–145)
Sodium: 130 mmol/L — ABNORMAL LOW (ref 135–145)

## 2019-03-06 MED ORDER — ALUM & MAG HYDROXIDE-SIMETH 200-200-20 MG/5ML PO SUSP
30.0000 mL | ORAL | Status: DC | PRN
  Administered 2019-03-06: 20:00:00 30 mL via ORAL
  Filled 2019-03-06: qty 30

## 2019-03-06 MED ORDER — SODIUM CHLORIDE 0.9 % IV SOLN
INTRAVENOUS | Status: DC
  Administered 2019-03-06 – 2019-03-07 (×2): via INTRAVENOUS

## 2019-03-06 NOTE — Progress Notes (Signed)
[place] [facility] [place] NAME: Donna Aguirre    MR#:  [id number]  DATE OF BIRTH:  06-Jan-1956  SUBJECTIVE:  CHIEF COMPLAINT:   Chief Complaint  Patient presents with  . Chest Pain   Came with Weakness, leg cramping. Still have low Na.  REVIEW OF SYSTEMS:  CONSTITUTIONAL: No fever, have fatigue or weakness.  EYES: No blurred or double vision.  EARS, NOSE, AND THROAT: No tinnitus or ear pain.  RESPIRATORY: No cough, shortness of breath, wheezing or hemoptysis.  CARDIOVASCULAR: No chest pain, orthopnea, edema.  GASTROINTESTINAL: No nausea, vomiting, diarrhea or abdominal pain.  GENITOURINARY: No dysuria, hematuria.  ENDOCRINE: No polyuria, nocturia,  HEMATOLOGY: No anemia, easy bruising or bleeding SKIN: No rash or lesion. MUSCULOSKELETAL: No joint pain or arthritis.   NEUROLOGIC: No tingling, numbness, weakness.  PSYCHIATRY: No anxiety or depression.   ROS  DRUG ALLERGIES:  No Known Allergies  VITALS:  Blood pressure (!) 145/82, pulse 78, temperature 98.6 F (37 C), temperature source Oral, resp. rate 18, height 5\' 5"  (1.651 m), weight 63.1 kg, SpO2 100 %.  PHYSICAL EXAMINATION:  GENERAL:  63 y.o.-year-old patient lying in the bed with no acute distress.  EYES: Pupils equal, round, reactive to light and accommodation. No scleral icterus. Extraocular muscles intact.  HEENT: Head atraumatic, normocephalic. Oropharynx and nasopharynx clear.  NECK:  Supple, no jugular venous distention. No thyroid enlargement, no tenderness.  LUNGS: Normal breath sounds bilaterally, no wheezing, rales,rhonchi or crepitation. No use of accessory muscles of respiration.  CARDIOVASCULAR: S1, S2 normal. No murmurs, rubs, or gallops.  ABDOMEN: Soft, nontender, nondistended. Bowel sounds present. No organomegaly or mass.  EXTREMITIES: No pedal edema, cyanosis, or clubbing.  NEUROLOGIC: Cranial nerves II through XII are intact. Muscle strength 4/5 in all  extremities. Sensation intact. Gait not checked.  PSYCHIATRIC: The patient is alert and oriented x 3.  SKIN: No obvious rash, lesion, or ulcer.   Physical Exam LABORATORY PANEL:   CBC Recent Labs  Lab 03/04/19 2330  WBC 7.9  HGB 10.4*  HCT 31.3*  PLT 270   ------------------------------------------------------------------------------------------------------------------  Chemistries  Recent Labs  Lab 03/06/19 0508 03/06/19 1158  NA 131* 130*  K 4.1  --   CL 101  --   CO2 25  --   GLUCOSE 133*  --   BUN <5*  --   CREATININE 0.47  --   CALCIUM 8.4*  --   MG 1.7  --    ------------------------------------------------------------------------------------------------------------------  Cardiac Enzymes No results for input(s): TROPONINI in the last 168 hours. ------------------------------------------------------------------------------------------------------------------  RADIOLOGY:  Dg Chest 2 View  Result Date: 03/04/2019 CLINICAL DATA:  Chest pain EXAM: CHEST - 2 VIEW COMPARISON:  April 22, 2017 FINDINGS: The heart size and mediastinal contours are within normal limits. There is a focal patchy airspace opacity seen within the right lower lung. The left lung is clear. No pleural effusion. Degenerative changes seen in the midthoracic spine. No acute osseous findings. IMPRESSION: Nonspecific patchy airspace opacity in the right lower lung. This could be due to early atypical infectious etiology or atelectasis. Electronically Signed   By: Prudencio Pair M.D.   On: 03/04/2019 23:49    ASSESSMENT AND PLAN:   Active Problems:   Hyponatremia  This is a 63 year old female admitted for chest pain. 1.  Chest pain: Atypical - resolved.  EKG is nonischemic.  Continue to follow cardiac biomarkers.  Monitor telemetry.  Consult cardiology appreciated, no further work-up. 2.  Hypertension: Uncontrolled; start lisinopril.  Better controlled now. 3.  Community-acquired pneumonia: The  patient does not meet criteria for sepsis.  Start doxycycline. 4.  Alcohol abuse: Ongoing; elevated blood pressure may be secondary to withdrawal as well.  Started CIWA.  Ativan as needed.  Not much withdrawal symptoms. 5.  DVT prophylaxis: Lovenox 6.  GI prophylaxis: None 7.  Hyponatremia-likely due to alcohol consumption, IV fluids.   All the records are reviewed and case discussed with Care Management/Social Workerr. Management plans discussed with the patient, family and they are in agreement.  CODE STATUS: Full.  TOTAL TIME TAKING CARE OF THIS PATIENT: 35 minutes.     POSSIBLE D/C IN 1-2 DAYS, DEPENDING ON CLINICAL CONDITION.   Vaughan Basta M.D on 03/06/2019   Between 7am to 6pm - Pager - [phone]  After 6pm go to [email or web address] - password [email or web address] [facility]  Office  [phone]  CC: Primary care physician; Patient, No Pcp Per  Note: This dictation was prepared with Dragon dictation along with smaller phrase technology. Any transcriptional errors that result from this process are unintentional.

## 2019-03-07 LAB — GLUCOSE, CAPILLARY
Glucose-Capillary: 91 mg/dL (ref 70–99)
Glucose-Capillary: 94 mg/dL (ref 70–99)

## 2019-03-07 LAB — SODIUM: Sodium: 131 mmol/L — ABNORMAL LOW (ref 135–145)

## 2019-03-07 MED ORDER — LISINOPRIL 10 MG PO TABS: 10.0000 mg | ORAL_TABLET | Freq: Every day | ORAL | 0 refills | Status: DC

## 2019-03-07 MED ORDER — PANTOPRAZOLE SODIUM 40 MG PO TBEC: 40.0000 mg | DELAYED_RELEASE_TABLET | Freq: Two times a day (BID) | ORAL | 2 refills | Status: DC

## 2019-03-07 MED ORDER — THIAMINE HCL 100 MG PO TABS: 100.0000 mg | ORAL_TABLET | Freq: Every day | ORAL | 0 refills | Status: DC

## 2019-03-07 MED ORDER — METOPROLOL TARTRATE 25 MG PO TABS
25.0000 mg | ORAL_TABLET | Freq: Two times a day (BID) | ORAL | Status: DC
  Administered 2019-03-07: 25 mg via ORAL
  Filled 2019-03-07: qty 1

## 2019-03-07 MED ORDER — DOXYCYCLINE HYCLATE 100 MG PO TABS: 100.0000 mg | ORAL_TABLET | Freq: Two times a day (BID) | ORAL | 0 refills | Status: AC

## 2019-03-07 MED ORDER — FOLIC ACID 1 MG PO TABS: 1.0000 mg | ORAL_TABLET | Freq: Every day | ORAL | 0 refills | Status: DC

## 2019-03-07 NOTE — Evaluation (Signed)
Physical Therapy Evaluation Patient Details Name: CAIDEN DOLLARHIDE MRN: [id number] DOB: 10/14/1955 Today's Date: 03/07/2019   History of Present Illness  Pt admitted for hyponatremia with complaints of chest pain. History includes HTN, DM, and gout. Of note, pt reports fall last week, limited details given.  Clinical Impression  Pt is a pleasant 63 year old female who was admitted for hyponatremia. No chest pain during evaluation. Pt demonstrates all bed mobility/transfers/ambulation at baseline level without AD or physical assistance. Pt does not require any further PT needs at this time as she appears to be at baseline level. Pt will be dc in house and does not require follow up. RN aware. Will dc current orders.     Follow Up Recommendations No PT follow up    Equipment Recommendations  None recommended by PT    Recommendations for Other Services       Precautions / Restrictions Precautions Precautions: Fall Restrictions Weight Bearing Restrictions: No      Mobility  Bed Mobility Overal bed mobility: Modified Independent             General bed mobility comments: safe technique without use of railing. Once seated at EOB, upright posture noted  Transfers Overall transfer level: Needs assistance Equipment used: None Transfers: Sit to/from Stand Sit to Stand: Min guard         General transfer comment: slightly unsteady towards R side. No formal LOB. Able to maintain static stance safely  Ambulation/Gait Ambulation/Gait assistance: Min guard Gait Distance (Feet): 125 Feet Assistive device: None Gait Pattern/deviations: Step-through pattern     General Gait Details: ambulated in hallway with slight R leaning/staggering. Pt reports this is her baseline. No formal LOB and no fatigue  Stairs            Wheelchair Mobility    Modified Rankin (Stroke Patients Only)       Balance Overall balance assessment: History of Falls;Needs  assistance Sitting-balance support: Feet supported Sitting balance-Leahy Scale: Good     Standing balance support: Bilateral upper extremity supported Standing balance-Leahy Scale: Good                               Pertinent Vitals/Pain Pain Assessment: No/denies pain    Home Living Family/patient expects to be discharged to:: Private residence Living Arrangements: Alone Available Help at Discharge: Family(has daughter that lives near by) Type of Home: House Home Access: Level entry     Home Layout: One level Home Equipment: Kasandra Knudsen - single point      Prior Function Level of Independence: Independent         Comments: reports she has SPC, however doesn't use. Reports she is independent with all mobility and ADLs     Hand Dominance        Extremity/Trunk Assessment   Upper Extremity Assessment Upper Extremity Assessment: Overall WFL for tasks assessed    Lower Extremity Assessment Lower Extremity Assessment: Overall WFL for tasks assessed       Communication   Communication: No difficulties  Cognition Arousal/Alertness: Awake/alert Behavior During Therapy: WFL for tasks assessed/performed Overall Cognitive Status: Within Functional Limits for tasks assessed                                        General Comments      Exercises  Assessment/Plan    PT Assessment Patent does not need any further PT services  PT Problem List         PT Treatment Interventions      PT Goals (Current goals can be found in the Care Plan section)  Acute Rehab PT Goals Patient Stated Goal: to go home PT Goal Formulation: All assessment and education complete, DC therapy Time For Goal Achievement: 03/07/19 Potential to Achieve Goals: Good    Frequency     Barriers to discharge        Co-evaluation               AM-PAC PT "6 Clicks" Mobility  Outcome Measure Help needed turning from your back to your side while in a flat  bed without using bedrails?: None Help needed moving from lying on your back to sitting on the side of a flat bed without using bedrails?: None Help needed moving to and from a bed to a chair (including a wheelchair)?: A Little Help needed standing up from a chair using your arms (e.g., wheelchair or bedside chair)?: A Little Help needed to walk in hospital room?: A Little Help needed climbing 3-5 steps with a railing? : A Little 6 Click Score: 20    End of Session Equipment Utilized During Treatment: Gait belt Activity Tolerance: Patient tolerated treatment well Patient left: in chair;with chair alarm set Nurse Communication: Mobility status PT Visit Diagnosis: Muscle weakness (generalized) (M62.81);Difficulty in walking, not elsewhere classified (R26.2)    Time: [id number] PT Time Calculation (min) (ACUTE ONLY): 19 min   Charges:   PT Evaluation $PT Eval Low Complexity: 1 Low          Greggory Stallion, PT, DPT [phone]   Emberlin Verner 03/07/2019, 3:17 PM

## 2019-03-07 NOTE — TOC Transition Note (Addendum)
Transition of Care [facility]) - CM/SW Discharge Note   Patient Details  Name: Donna Aguirre MRN: [id number] Date of Birth: 04-20-1956  [facility] CM/SW Contact:  Ross Ludwig, LCSW Phone Number: 03/07/2019, 4:31 PM   Clinical Narrative:     Patient is a 63 year old female who is alert and oriented x4.  CSW met with patient to discuss home health options, CSW discussed with patient that since she only has Medicaid it will be difficult to find an agency that can accept patient.  CSW contacted [facility], [facility], [facility], [facility], [facility], [facility], and [facility] none of the agencies are able to accept patient.    CSW unable to find home health agency, CSW updated physician.   5:40pm  CSW spoke to patient, and told her of the situation, and she said that is fine she does not feel like she needs home health.  Patient stated she does not want home health anyway, CSW updated physician and bedside nurse.   Final next level of care: [place] Barriers to Discharge: Barriers Resolved   Patient Goals and CMS Choice Patient states their goals for this hospitalization and ongoing recovery are:: To return back home with home health. CMS Medicare.gov Compare Post Acute Care list provided to:: Patient Choice offered to / list presented to : Patient  Discharge Placement   CSW trying to set up home health, for patient to receive services at home.                     Discharge Plan and Services                DME Arranged: N/A DME Agency: NA                  Social Determinants of Health (SDOH) Interventions     Readmission Risk Interventions No flowsheet data found.

## 2019-03-07 NOTE — Progress Notes (Signed)
[place] [facility] [place] NAME: Donna Aguirre    MR#:  [id number]  DATE OF BIRTH:  1955-09-17  SUBJECTIVE:  CHIEF COMPLAINT:   Chief Complaint  Patient presents with  . Chest Pain   Came with Weakness, leg cramping.  Feels much better now.  REVIEW OF SYSTEMS:  CONSTITUTIONAL: No fever, have fatigue or weakness.  EYES: No blurred or double vision.  EARS, NOSE, AND THROAT: No tinnitus or ear pain.  RESPIRATORY: No cough, shortness of breath, wheezing or hemoptysis.  CARDIOVASCULAR: No chest pain, orthopnea, edema.  GASTROINTESTINAL: No nausea, vomiting, diarrhea or abdominal pain.  GENITOURINARY: No dysuria, hematuria.  ENDOCRINE: No polyuria, nocturia,  HEMATOLOGY: No anemia, easy bruising or bleeding SKIN: No rash or lesion. MUSCULOSKELETAL: No joint pain or arthritis.   NEUROLOGIC: No tingling, numbness, weakness.  PSYCHIATRY: No anxiety or depression.   ROS  DRUG ALLERGIES:  No Known Allergies  VITALS:  Blood pressure (!) 176/88, pulse 88, temperature 98.6 F (37 C), temperature source Oral, resp. rate 18, height 5\' 5"  (1.651 m), weight 62.1 kg, SpO2 100 %.  PHYSICAL EXAMINATION:  GENERAL:  63 y.o.-year-old patient lying in the bed with no acute distress.  EYES: Pupils equal, round, reactive to light and accommodation. No scleral icterus. Extraocular muscles intact.  HEENT: Head atraumatic, normocephalic. Oropharynx and nasopharynx clear.  NECK:  Supple, no jugular venous distention. No thyroid enlargement, no tenderness.  LUNGS: Normal breath sounds bilaterally, no wheezing, rales,rhonchi or crepitation. No use of accessory muscles of respiration.  CARDIOVASCULAR: S1, S2 normal. No murmurs, rubs, or gallops.  ABDOMEN: Soft, nontender, nondistended. Bowel sounds present. No organomegaly or mass.  EXTREMITIES: No pedal edema, cyanosis, or clubbing.  NEUROLOGIC: Cranial nerves II through XII are intact. Muscle strength 4/5 in all  extremities. Sensation intact. Gait not checked.  PSYCHIATRIC: The patient is alert and oriented x 3.  SKIN: No obvious rash, lesion, or ulcer.   Physical Exam LABORATORY PANEL:   CBC Recent Labs  Lab 03/04/19 2330  WBC 7.9  HGB 10.4*  HCT 31.3*  PLT 270   ------------------------------------------------------------------------------------------------------------------  Chemistries  Recent Labs  Lab 03/06/19 0508  03/07/19 0001  NA 131*   < > 131*  K 4.1  --   --   CL 101  --   --   CO2 25  --   --   GLUCOSE 133*  --   --   BUN <5*  --   --   CREATININE 0.47  --   --   CALCIUM 8.4*  --   --   MG 1.7  --   --    < > = values in this interval not displayed.   ------------------------------------------------------------------------------------------------------------------  Cardiac Enzymes No results for input(s): TROPONINI in the last 168 hours. ------------------------------------------------------------------------------------------------------------------  RADIOLOGY:  No results found.  ASSESSMENT AND PLAN:   Active Problems:   Hyponatremia  This is a 63 year old female admitted for chest pain. 1.  Chest pain: Atypical - resolved.  EKG is nonischemic.  Continue to follow cardiac biomarkers.  Monitor telemetry.  Consult cardiology appreciated, no further work-up. Patient has long time GI complaints, endoscopy was done which showed some esophageal stricture she was advised to have modified diet and follow-up in GI at [facility]. I spoke to daughter at length and called swallow evaluation here. Advised to continue modified diet and continue to follow at [facility]. 2.  Hypertension: Uncontrolled; start lisinopril.  Better controlled now. 3.  Community-acquired pneumonia: The patient does not meet criteria for sepsis.  Start doxycycline. Finish 3-4 more days. 4.  Alcohol abuse: Ongoing; elevated blood pressure may be secondary to withdrawal as well.  Started CIWA.  Ativan as  needed.  Not much withdrawal symptoms.  Spoke to daughter also to provide some support for quitting the alcohol habits. 5.  DVT prophylaxis: Lovenox 6.  GI prophylaxis: None 7.  Hyponatremia-likely due to alcohol consumption, IV fluids.  Patient has chronic low sodium.  Feels better.   All the records are reviewed and case discussed with Care Management/Social Workerr. Management plans discussed with the patient, family and they are in agreement.  CODE STATUS: Full.  TOTAL TIME TAKING CARE OF THIS PATIENT: 35 minutes.    POSSIBLE D/C IN 1-2 DAYS, DEPENDING ON CLINICAL CONDITION.   Vaughan Basta M.D on 03/07/2019   Between 7am to 6pm - Pager - [phone]  After 6pm go to [email or web address] - password [email or web address] [facility] [facility]  Office  [phone]  CC: Primary care physician; Patient, No Pcp Per  Note: This dictation was prepared with Dragon dictation along with smaller phrase technology. Any transcriptional errors that result from this process are unintentional.

## 2019-03-07 NOTE — Evaluation (Signed)
Clinical/Bedside Swallow Evaluation Patient Details  Name: Donna Aguirre MRN: [id number] Date of Birth: 10/05/1955  Today's Date: 03/07/2019 Time: SLP Start Time (ACUTE ONLY): 1520 SLP Stop Time (ACUTE ONLY): 1604 SLP Time Calculation (min) (ACUTE ONLY): 44 min  Past Medical History:  Past Medical History:  Diagnosis Date  . Arthritis   . Diabetes mellitus without complication ([place])   . Gout   . Hypertension    Past Surgical History: History reviewed. No pertinent surgical history. HPI:  Pt is a 63 y.o. female w/ h/o HTN, DM, Reflux issues, tobacco and ETOH use who presents to the ED from home with a chief complaint of chest pain and generalized weakness.  Patient states she fell last week due to her leg going out.  Thinks she struck her right ribs because she has been hurting in her right rib cage.  Endorses nausea without vomiting.  Admits to daily beer drinking without history of DTs.  Pt endorsed having "a lot of phlegm for 20 years".  Noted a CT in 09/2018 revealing: "Marked circumferential thickening in the lower esophagus extending into the esophagogastric junction. This is more prominent than typically seen for esophagitis and is concerning for esophageal neoplasm".  Also, a GI note from [facility] on 12/2018 described Upper Endoscopy: "Likely benign-appearing esophageal stenosis. Dilation was not attempted due to presence of Moderate amount of food in the stomach and inability to intubate patient at [place]".  Per chart notes from [facility], pt has been recommended to f/u w/ GI for assessment, dx, and management of her Esophagus.  She has increased saliva, uses a spit cup baseline.  Pt is Edentulous baseline.  Assessment / Plan / Recommendation Clinical Impression  Pt appears to present w/ adequate oropharyngeal phase swallowing function in light of Edentulous status (baseline) w/ reduced risk for prandial aspiration when following general aspiration precautions. Pt has a baseline of  Esophageal dysmotility and is currently being followed by GI at [facility] for ongoing assessment/dx; recommended to follow GERD/Reflux precautions --- ANY Esophageal dysmotility/stenosis/GERD issues can increase Esophageal irritation and phlegm thus risk for aspiration of Reflux material during/after meals; phlegm.  Pt also endorses having "a lot of phlegm for 20 years" -- suspect this may be related to the Distal Esophageal issues noted in chart notes/Imaging. (Noted a CT in 09/2018 revealing: "Marked circumferential thickening in the lower esophagus extending into the esophagogastric junction. This is more prominent than typically seen for esophagitis and is concerning for esophageal neoplasm". Noted a [place] [facility] note on 10/2018 for Upper GI Endoscopy.  At this eval today, pt exhibited increased saliva, frequently used a spit cup to clear mouth.  Pt accepted a few po trials w/ SLP exhibiting No overt s/s of aspiration during/post po trials of sips of thin liquids via Straw, purees/soft solids; no decline in vocal quality or respiratory status noted.  Oral phase was grossly [facility] for bolus management/straw sipping; timely A-P transfer noted and full oral clearing achieved b/t trials.  Min extra time needed for mashing/gumming of solids -- encouraged use of a puree to moisten, condiments, gravies and to cut solids Small.  OM exam appeared [facility] w/ appropriate lingual/labial strength and ROM.  Pt fed self w/ setup support; she positioned herself in bed for trials appropriately.    Recommend a mech soft diet(moistening foods well, small bites) in light of the Esophageal Dysmotility and pt's Edentulous status baseline -- easier mashing/gumming w/ soft, cooked foods, w/ Thin liquids. Rec. STRICT REFLUX/GERD  Precautions; general aspiration precautions. Pt appears at her baseline w/ no new changes in her oropharyngeal phase swallowing abilities -- she is being followed by GI [facility] currently for GI dx and management  per chart notes/MD. NSG to reconsult if any decline in status while admitted.  Of note, briefly discussed w/ pt food consistencies and preparation; the need to avoid Carbonated drinks w/ meals (wait until 1+hrs after meals d/t Esophageal dysmotility; more frequent, mini meals and possible need for a drink supplement to support(Dietician consult and f/u).  SLP Visit Diagnosis: Dysphagia, pharyngoesophageal phase (R13.14)(Esophageal phase dysmotility baseline)    Aspiration Risk  Mild aspiration risk(of increased phlegm, saliva material from Esophageal Dysmotility) -- see [facility] GI notes   Diet Recommendation  Mech Soft diet w/ thin liquids; general aspiration precautions; STRICT REFLUX precautions  Medication Administration: Whole meds with puree(if needed for easier swallowing, clearing of Esophagus)    Other  Recommendations Recommended Consults: Consider GI evaluation;Consider esophageal assessment(ongoing w/ [facility]) Oral Care Recommendations: Oral care BID;Patient independent with oral care Other Recommendations: (n/a)   Follow up Recommendations None      Frequency and Duration (n/a)  (n/a)       Prognosis Prognosis for Safe Diet Advancement: Fair Barriers to Reach Goals: Time post onset;Severity of deficits(Esophageal dysmotility; Edentulous status)      Swallow Study   General Date of Onset: 03/05/19 HPI: Pt is a 63 y.o. female who presents to the ED from home with a chief complaint of chest pain and generalized weakness.  Patient states she fell last week due to her leg going out.  Thinks she struck her right ribs because she has been hurting in her right rib cage.  Endorses nausea without vomiting.  Admits to daily beer drinking without history of DTs.  Pt endorsed haing "a lot of phlegm for 20 years".  Noted a CT in 09/2018 revealing: "Marked circumferential thickening in the lower esophagus extending into the esophagogastric junction. This is more prominent than  typically seen for esophagitis and is concerning for esophageal neoplasm".  Also, a GI note from [facility] on 12/2018 described Upper Endoscopy: "Likely benign-appearing esophageal stenosis. Dilation was not attempted due to presence of Moderate amount of food in the stomach and inability to intubate patient at [place]".  She was recommended to f/u w/ another Upper Endoscopy and biospy(?).  She has increased saliva, uses a spit cup baseline. Per chart notes from [facility], pt has been recommended to f/u w/ GI for assessment, dx, and management of her Esophagus.  Type of Study: Bedside Swallow Evaluation Previous Swallow Assessment: none but followed by GI at [place] Prior to this Study: Regular;Thin liquids Temperature Spikes Noted: No(wbc 7.9) Respiratory Status: Room air History of Recent Intubation: No Behavior/Cognition: Alert;Cooperative;Requires cueing;Distractible(min) Oral Cavity Assessment: Excessive secretions Oral Care Completed by SLP: No(pt denied need) Oral Cavity - Dentition: Edentulous Vision: Functional for self-feeding Self-Feeding Abilities: Able to feed self(setup given) Patient Positioning: Upright in bed Baseline Vocal Quality: Normal Volitional Cough: Strong Volitional Swallow: Able to elicit    Oral/Motor/Sensory Function Overall Oral Motor/Sensory Function: Within functional limits   Ice Chips Ice chips: Not tested   Thin Liquid Thin Liquid: Within functional limits Presentation: Self Fed;Straw(8-9 trials accepted)    Nectar Thick Nectar Thick Liquid: Not tested   Honey Thick Honey Thick Liquid: Not tested   Puree Puree: Within functional limits Presentation: Self Fed;Spoon(4 trials)   Solid     Solid: Impaired(mild) Presentation:  Self Fed;Spoon(2 trials) Oral Phase Impairments: Impaired mastication(Edentulous status baseline) Oral Phase Functional Implications: Impaired mastication(prolonged time to mash/gum) Pharyngeal Phase Impairments: (none)         Orinda Kenner, MS, CCC-SLP Watson,Katherine 03/07/2019,4:31 PM

## 2019-03-07 NOTE — Clinical Social Work Note (Addendum)
PT saw patient and are not recommending any follow up, however patient would like some home health.  CSW working on trying to find home health agency.  CSW contacted [facility], [facility], [facility], [facility], [facility], and [facility] none of the agencies are able to accept patient.  Jones Broom. Alie Moudy, MSW, LCSW [phone]  03/07/2019 3:35 PM

## 2019-03-10 LAB — CULTURE, BLOOD (ROUTINE X 2)
Culture: NO GROWTH
Culture: NO GROWTH
Special Requests: ADEQUATE

## 2019-03-14 NOTE — Discharge Summary (Signed)
[facility] [place] NAME: Laparis Walloch    MR#:  [id number]  DATE OF BIRTH:  [birth year]  DATE OF ADMISSION:  03/05/2019 ADMITTING PHYSICIAN: Harrie Foreman, MD  DATE OF DISCHARGE: 03/07/2019  6:32 PM  PRIMARY CARE PHYSICIAN: Patient, No Pcp Per    ADMISSION DIAGNOSIS:  Hypokalemia [E87.6] Hyponatremia [E87.1] Elevated troponin [R77.8] Nonspecific chest pain [R07.9]  DISCHARGE DIAGNOSIS:  Active Problems:   Hyponatremia   SECONDARY DIAGNOSIS:   Past Medical History:  Diagnosis Date  . Arthritis   . Diabetes mellitus without complication ([place])   . Gout   . Hypertension     HOSPITAL COURSE:   This is a 63 year old female admitted for chest pain. 1. Chest pain: Atypical - resolved. EKG is nonischemic. Continue to follow cardiac biomarkers. Monitor telemetry. Consult cardiology appreciated, no further work-up. Patient has long time GI complaints, endoscopy was done which showed some esophageal stricture she was advised to have modified diet and follow-up in GI at [facility]. I spoke to daughter at length and called swallow evaluation here. Advised to continue modified diet and continue to follow at [place]. 2. Hypertension: Uncontrolled; start lisinopril.  Better controlled now. 3. Community-acquired pneumonia: The patient does not meet criteria for sepsis. Start doxycycline. Finish 3-4 more days. 4. Alcohol abuse: Ongoing; elevated blood pressure may be secondary to withdrawal as well. Started CIWA. Ativan as needed.  Not much withdrawal symptoms.  Spoke to daughter also to provide some support for quitting the alcohol habits. 5. DVT prophylaxis: Lovenox 6. GI prophylaxis: None 7.  Hyponatremia-likely due to alcohol consumption, IV fluids.  Patient has chronic low sodium.  Feels better.   DISCHARGE CONDITIONS:   Stable.  CONSULTS OBTAINED:    DRUG ALLERGIES:  No Known Allergies  DISCHARGE MEDICATIONS:    Allergies as of 03/07/2019   No Known Allergies     Medication List    TAKE these medications   folic acid 1 MG tablet Commonly known as: FOLVITE Take 1 tablet (1 mg total) by mouth daily.   lisinopril 10 MG tablet Commonly known as: ZESTRIL Take 1 tablet (10 mg total) by mouth daily.   pantoprazole 40 MG tablet Commonly known as: PROTONIX Take 1 tablet (40 mg total) by mouth 2 (two) times daily before a meal.   thiamine 100 MG tablet Take 1 tablet (100 mg total) by mouth daily.     ASK your doctor about these medications   doxycycline 100 MG tablet Commonly known as: VIBRA-TABS Take 1 tablet (100 mg total) by mouth every 12 (twelve) hours for 3 days. Ask about: Should I take this medication?        DISCHARGE INSTRUCTIONS:    Follow with [facility].  If you experience worsening of your admission symptoms, develop shortness of breath, life threatening emergency, suicidal or homicidal thoughts you must seek medical attention immediately by calling 911 or calling your MD immediately  if symptoms less severe.  You Must read complete instructions/literature along with all the possible adverse reactions/side effects for all the Medicines you take and that have been prescribed to you. Take any new Medicines after you have completely understood and accept all the possible adverse reactions/side effects.   Please note  You were cared for by a hospitalist during your hospital stay. If you have any questions about your discharge medications or the care you received while you were in the hospital after you are discharged, you can call  the unit and asked to speak with the hospitalist on call if the hospitalist that took care of you is not available. Once you are discharged, your primary care physician will handle any further medical issues. Please note that NO REFILLS for any discharge medications will be authorized once you are discharged, as it is imperative that you return to your  primary care physician (or establish a relationship with a primary care physician if you do not have one) for your aftercare needs so that they can reassess your need for medications and monitor your lab values.    Today   CHIEF COMPLAINT:   Chief Complaint  Patient presents with  . Chest Pain    HISTORY OF PRESENT ILLNESS:  Olie Teat  is a 63 y.o. female with a known history of  hypertension and diabetes presents to the emergency department complaining of chest pain.  The patient reports a fall last week.  She denies loss of consciousness or trauma to her head.  She admits that she drinks alcohol does not have a clear recollection of the details of her chest pain but points to her ribs as the source of pain.  Chest x-ray shows no fractures but was concerning for a right lower lobe infiltrate.  The patient denies fever but admits to nausea but no vomiting, shortness of breath or diaphoresis.  She was given ceftriaxone and azithromycin.  Laboratory evaluation also revealed elevated troponin although her EKG showed no signs of ischemia.  Due to ongoing chest pain as well as uncontrolled blood pressure the emergency department staff called the hospitalist service for admission.  VITAL SIGNS:  Blood pressure (!) 176/88, pulse 88, temperature 98.6 F (37 C), temperature source Oral, resp. rate 18, height 5\' 5"  (1.651 m), weight 62.1 kg, SpO2 100 %.  I/O:  No intake or output data in the 24 hours ending 03/14/19 0755  PHYSICAL EXAMINATION:  GENERAL:  63 y.o.-year-old patient lying in the bed with no acute distress.  EYES: Pupils equal, round, reactive to light and accommodation. No scleral icterus. Extraocular muscles intact.  HEENT: Head atraumatic, normocephalic. Oropharynx and nasopharynx clear.  NECK:  Supple, no jugular venous distention. No thyroid enlargement, no tenderness.  LUNGS: Normal breath sounds bilaterally, no wheezing, rales,rhonchi or crepitation. No use of accessory  muscles of respiration.  CARDIOVASCULAR: S1, S2 normal. No murmurs, rubs, or gallops.  ABDOMEN: Soft, non-tender, non-distended. Bowel sounds present. No organomegaly or mass.  EXTREMITIES: No pedal edema, cyanosis, or clubbing.  NEUROLOGIC: Cranial nerves II through XII are intact. Muscle strength 5/5 in all extremities. Sensation intact. Gait not checked.  PSYCHIATRIC: The patient is alert and oriented x 3.  SKIN: No obvious rash, lesion, or ulcer.   DATA REVIEW:   CBC No results for input(s): WBC, HGB, HCT, PLT in the last 168 hours.  Chemistries  No results for input(s): NA, K, CL, CO2, GLUCOSE, BUN, CREATININE, CALCIUM, MG, AST, ALT, ALKPHOS, BILITOT in the last 168 hours.  Invalid input(s): GFRCGP  Cardiac Enzymes No results for input(s): TROPONINI in the last 168 hours.  Microbiology Results  Results for orders placed or performed during the hospital encounter of 03/05/19  Culture, blood (routine x 2)     Status: None   Collection Time: 03/05/19  2:15 AM   Specimen: BLOOD  Result Value Ref Range Status   Specimen Description BLOOD LEFT ANTECUBITAL  Final   Special Requests   Final    BOTTLES DRAWN AEROBIC AND ANAEROBIC Blood Culture  adequate volume   Culture   Final    NO GROWTH 5 DAYS Performed at [facility], [address] [place], [place] [address]    Report Status 03/10/2019 FINAL  Final  Culture, blood (routine x 2)     Status: None   Collection Time: 03/05/19  2:15 AM   Specimen: BLOOD  Result Value Ref Range Status   Specimen Description BLOOD BLOOD LEFT HAND  Final   Special Requests   Final    BOTTLES DRAWN AEROBIC AND ANAEROBIC Blood Culture results may not be optimal due to an inadequate volume of blood received in culture bottles   Culture   Final    NO GROWTH 5 DAYS Performed at [facility], [address], [place], [place] [address]    Report Status 03/10/2019 FINAL  Final  SARS CORONAVIRUS 2 (TAT 6-24 HRS) Nasopharyngeal  Nasopharyngeal Swab     Status: None   Collection Time: 03/05/19  2:15 AM   Specimen: Nasopharyngeal Swab  Result Value Ref Range Status   SARS Coronavirus 2 NEGATIVE NEGATIVE Final    Comment: (NOTE) SARS-CoV-2 target nucleic acids are NOT DETECTED. The SARS-CoV-2 RNA is generally detectable in upper and lower respiratory specimens during the acute phase of infection. Negative results do not preclude SARS-CoV-2 infection, do not rule out co-infections with other pathogens, and should not be used as the sole basis for treatment or other patient management decisions. Negative results must be combined with clinical observations, patient history, and epidemiological information. The expected result is Negative. Fact Sheet for Patients: SugarRoll.be Fact Sheet for Healthcare Providers: https://www.woods-mathews.com/ This test is not yet approved or cleared by the [place] FDA and  has been authorized for detection and/or diagnosis of SARS-CoV-2 by FDA under an Emergency Use Authorization (EUA). This EUA will remain  in effect (meaning this test can be used) for the duration of the COVID-19 declaration under Section 56 4(b)(1) of the Act, 21 U.S.C. section 360bbb-3(b)(1), unless the authorization is terminated or revoked sooner. Performed at [facility], [place] [address] [place],  [address]     RADIOLOGY:  No results found.  EKG:   Orders placed or performed during the hospital encounter of 03/05/19  . ED EKG  . EKG 12-Lead  . EKG 12-Lead  . ED EKG  . EKG      Management plans discussed with the patient, family and they are in agreement.  CODE STATUS:  Code Status History    Date Active Date Inactive Code Status Order ID Comments User Context   03/05/2019 0513 03/07/2019 2137 Full Code [id number]  Harrie Foreman, MD Inpatient   11/16/2018 2358 11/18/2018 1741 Full Code [id number]  Demetrios Loll, MD ED   [facility]  Planning Activity      TOTAL TIME TAKING CARE OF THIS PATIENT: 35 minutes.    Vaughan Basta M.D on 03/14/2019 at 7:55 AM  Between 7am to 6pm - Pager - [phone]  After 6pm go to [email or web address] - password [email or web address] [facility]  Office  [phone]  CC: Primary care physician; Patient, No Pcp Per   Note: This dictation was prepared with Dragon dictation along with smaller phrase technology. Any transcriptional errors that result from this process are unintentional.

## 2019-09-24 ENCOUNTER — Emergency Department
Admission: EM | Admit: 2019-09-24 | Discharge: 2019-09-24 | Discharge status: Against Medical Advice | Payer: Medicaid Other

## 2019-11-21 ENCOUNTER — Encounter: Payer: Self-pay | Admitting: *Deleted

## 2019-11-21 ENCOUNTER — Other Ambulatory Visit: Payer: Self-pay

## 2019-11-21 DIAGNOSIS — K222 Esophageal obstruction: Principal | ICD-10-CM | POA: Diagnosis present

## 2019-11-21 DIAGNOSIS — I248 Other forms of acute ischemic heart disease: Secondary | ICD-10-CM | POA: Diagnosis present

## 2019-11-21 DIAGNOSIS — F17210 Nicotine dependence, cigarettes, uncomplicated: Secondary | ICD-10-CM | POA: Diagnosis present

## 2019-11-21 DIAGNOSIS — K2210 Ulcer of esophagus without bleeding: Secondary | ICD-10-CM | POA: Diagnosis present

## 2019-11-21 DIAGNOSIS — F1020 Alcohol dependence, uncomplicated: Secondary | ICD-10-CM | POA: Diagnosis present

## 2019-11-21 DIAGNOSIS — E876 Hypokalemia: Secondary | ICD-10-CM | POA: Diagnosis present

## 2019-11-21 DIAGNOSIS — Z79899 Other long term (current) drug therapy: Secondary | ICD-10-CM

## 2019-11-21 DIAGNOSIS — K5900 Constipation, unspecified: Secondary | ICD-10-CM | POA: Diagnosis present

## 2019-11-21 DIAGNOSIS — E119 Type 2 diabetes mellitus without complications: Secondary | ICD-10-CM | POA: Diagnosis present

## 2019-11-21 DIAGNOSIS — R1314 Dysphagia, pharyngoesophageal phase: Secondary | ICD-10-CM | POA: Diagnosis present

## 2019-11-21 DIAGNOSIS — I10 Essential (primary) hypertension: Secondary | ICD-10-CM | POA: Diagnosis present

## 2019-11-21 DIAGNOSIS — N390 Urinary tract infection, site not specified: Secondary | ICD-10-CM | POA: Diagnosis present

## 2019-11-21 DIAGNOSIS — B9620 Unspecified Escherichia coli [E. coli] as the cause of diseases classified elsewhere: Secondary | ICD-10-CM | POA: Diagnosis present

## 2019-11-21 DIAGNOSIS — K319 Disease of stomach and duodenum, unspecified: Secondary | ICD-10-CM | POA: Diagnosis present

## 2019-11-21 DIAGNOSIS — Z20822 Contact with and (suspected) exposure to covid-19: Secondary | ICD-10-CM | POA: Diagnosis present

## 2019-11-21 DIAGNOSIS — D61818 Other pancytopenia: Secondary | ICD-10-CM | POA: Diagnosis present

## 2019-11-21 DIAGNOSIS — R000 Tachycardia, unspecified: Secondary | ICD-10-CM | POA: Diagnosis present

## 2019-11-21 DIAGNOSIS — M1990 Unspecified osteoarthritis, unspecified site: Secondary | ICD-10-CM | POA: Diagnosis present

## 2019-11-21 DIAGNOSIS — K2960 Other gastritis without bleeding: Secondary | ICD-10-CM | POA: Diagnosis present

## 2019-11-21 DIAGNOSIS — Z8711 Personal history of peptic ulcer disease: Secondary | ICD-10-CM

## 2019-11-21 LAB — BASIC METABOLIC PANEL
Anion gap: 11 (ref 5–15)
BUN: <5 mg/dL — ABNORMAL LOW (ref 8–23)
CO2: 26 mmol/L (ref 22–32)
Calcium: 8.4 mg/dL — ABNORMAL LOW (ref 8.9–10.3)
Chloride: 98 mmol/L (ref 98–111)
Creatinine, Ser: 0.49 mg/dL (ref 0.44–1.00)
GFR calc Af Amer: >60 mL/min (ref >60)
GFR calc non Af Amer: >60 mL/min (ref >60)
Glucose, Bld: 116 mg/dL — ABNORMAL HIGH (ref 70–99)
Potassium: 2.7 mmol/L — CL (ref 3.5–5.1)
Sodium: 135 mmol/L (ref 135–145)

## 2019-11-21 LAB — URINALYSIS, COMPLETE (UACMP) WITH MICROSCOPIC
Bilirubin Urine: NEGATIVE
Glucose, UA: NEGATIVE mg/dL
Hgb urine dipstick: NEGATIVE
Ketones, ur: NEGATIVE mg/dL
Nitrite: NEGATIVE
Protein, ur: NEGATIVE mg/dL
Specific Gravity, Urine: 1.009 (ref 1.005–1.030)
pH: 7 (ref 5.0–8.0)

## 2019-11-21 LAB — CBC
HCT: 31.1 % — ABNORMAL LOW (ref 36.0–46.0)
Hemoglobin: 11 g/dL — ABNORMAL LOW (ref 12.0–15.0)
MCH: 28.4 pg (ref 26.0–34.0)
MCHC: 35.4 g/dL (ref 30.0–36.0)
MCV: 80.4 fL (ref 80.0–100.0)
Platelets: 145 10*3/uL — ABNORMAL LOW (ref 150–400)
RBC: 3.87 MIL/uL (ref 3.87–5.11)
RDW: 18 % — ABNORMAL HIGH (ref 11.5–15.5)
WBC: 2.7 10*3/uL — ABNORMAL LOW (ref 4.0–10.5)
nRBC: 0 % (ref 0.0–0.2)

## 2019-11-21 LAB — LIPASE, BLOOD: Lipase: 17 U/L (ref 11–51)

## 2019-11-21 LAB — TROPONIN I (HIGH SENSITIVITY): Troponin I (High Sensitivity): 14 ng/L (ref <18)

## 2019-11-21 NOTE — ED Triage Notes (Signed)
Pt brought in via ems from home with abd pain and nausea. Denies chest pain or sob  Sx for several months.  Pt last etoh was 2 days ago.  Pt alert.

## 2019-11-22 ENCOUNTER — Emergency Department: Payer: Medicaid Other

## 2019-11-22 ENCOUNTER — Other Ambulatory Visit: Payer: Self-pay

## 2019-11-22 ENCOUNTER — Inpatient Hospital Stay
Admission: EM | Admit: 2019-11-22 | Discharge: 2019-11-25 | DRG: 392 | Disposition: A | Discharge status: Home / Self Care | Payer: Medicaid Other | Attending: Internal Medicine | Admitting: Internal Medicine

## 2019-11-22 DIAGNOSIS — E876 Hypokalemia: Secondary | ICD-10-CM

## 2019-11-22 DIAGNOSIS — F1010 Alcohol abuse, uncomplicated: Secondary | ICD-10-CM

## 2019-11-22 DIAGNOSIS — R1110 Vomiting, unspecified: Secondary | ICD-10-CM

## 2019-11-22 DIAGNOSIS — R778 Other specified abnormalities of plasma proteins: Secondary | ICD-10-CM

## 2019-11-22 DIAGNOSIS — K279 Peptic ulcer, site unspecified, unspecified as acute or chronic, without hemorrhage or perforation: Secondary | ICD-10-CM

## 2019-11-22 DIAGNOSIS — R109 Unspecified abdominal pain: Secondary | ICD-10-CM

## 2019-11-22 DIAGNOSIS — D696 Thrombocytopenia, unspecified: Secondary | ICD-10-CM

## 2019-11-22 DIAGNOSIS — N390 Urinary tract infection, site not specified: Secondary | ICD-10-CM

## 2019-11-22 DIAGNOSIS — Z8719 Personal history of other diseases of the digestive system: Secondary | ICD-10-CM

## 2019-11-22 DIAGNOSIS — I10 Essential (primary) hypertension: Secondary | ICD-10-CM

## 2019-11-22 DIAGNOSIS — R9431 Abnormal electrocardiogram [ECG] [EKG]: Secondary | ICD-10-CM

## 2019-11-22 DIAGNOSIS — K209 Esophagitis, unspecified without bleeding: Secondary | ICD-10-CM

## 2019-11-22 DIAGNOSIS — R7989 Other specified abnormal findings of blood chemistry: Secondary | ICD-10-CM

## 2019-11-22 DIAGNOSIS — R1013 Epigastric pain: Secondary | ICD-10-CM | POA: Diagnosis not present

## 2019-11-22 DIAGNOSIS — F1020 Alcohol dependence, uncomplicated: Secondary | ICD-10-CM

## 2019-11-22 DIAGNOSIS — R1310 Dysphagia, unspecified: Secondary | ICD-10-CM

## 2019-11-22 DIAGNOSIS — D649 Anemia, unspecified: Secondary | ICD-10-CM

## 2019-11-22 DIAGNOSIS — K222 Esophageal obstruction: Secondary | ICD-10-CM

## 2019-11-22 DIAGNOSIS — D61818 Other pancytopenia: Secondary | ICD-10-CM

## 2019-11-22 DIAGNOSIS — D72819 Decreased white blood cell count, unspecified: Secondary | ICD-10-CM

## 2019-11-22 DIAGNOSIS — R000 Tachycardia, unspecified: Secondary | ICD-10-CM

## 2019-11-22 DIAGNOSIS — B9620 Unspecified Escherichia coli [E. coli] as the cause of diseases classified elsewhere: Secondary | ICD-10-CM

## 2019-11-22 HISTORY — DX: Hypokalemia: E87.6

## 2019-11-22 HISTORY — DX: Unspecified abdominal pain: R10.9

## 2019-11-22 HISTORY — DX: Hypomagnesemia: E83.42

## 2019-11-22 LAB — CBC
HCT: 34.8 % — ABNORMAL LOW (ref 36.0–46.0)
HCT: 35 % — ABNORMAL LOW (ref 36.0–46.0)
Hemoglobin: 12.5 g/dL (ref 12.0–15.0)
Hemoglobin: 12.5 g/dL (ref 12.0–15.0)
MCH: 28.6 pg (ref 26.0–34.0)
MCH: 28.5 pg (ref 26.0–34.0)
MCHC: 35.9 g/dL (ref 30.0–36.0)
MCHC: 35.7 g/dL (ref 30.0–36.0)
MCV: 79.6 fL — ABNORMAL LOW (ref 80.0–100.0)
MCV: 79.7 fL — ABNORMAL LOW (ref 80.0–100.0)
Platelets: 126 10*3/uL — ABNORMAL LOW (ref 150–400)
Platelets: 151 10*3/uL (ref 150–400)
RBC: 4.37 MIL/uL (ref 3.87–5.11)
RBC: 4.39 MIL/uL (ref 3.87–5.11)
RDW: 17.8 % — ABNORMAL HIGH (ref 11.5–15.5)
RDW: 18 % — ABNORMAL HIGH (ref 11.5–15.5)
WBC: 3.2 10*3/uL — ABNORMAL LOW (ref 4.0–10.5)
WBC: 2.8 10*3/uL — ABNORMAL LOW (ref 4.0–10.5)
nRBC: 0 % (ref 0.0–0.2)
nRBC: 0 % (ref 0.0–0.2)

## 2019-11-22 LAB — MAGNESIUM
Magnesium: 1.5 mg/dL — ABNORMAL LOW (ref 1.7–2.4)
Magnesium: 1.9 mg/dL (ref 1.7–2.4)

## 2019-11-22 LAB — DIFFERENTIAL
Abs Immature Granulocytes: 0.03 10*3/uL (ref 0.00–0.07)
Basophils Absolute: 0 10*3/uL (ref 0.0–0.1)
Basophils Relative: 1 %
Eosinophils Absolute: 0 10*3/uL (ref 0.0–0.5)
Eosinophils Relative: 0 %
Immature Granulocytes: 1 %
Lymphocytes Relative: 23 %
Lymphs Abs: 0.7 10*3/uL (ref 0.7–4.0)
Monocytes Absolute: 0.4 10*3/uL (ref 0.1–1.0)
Monocytes Relative: 13 %
Neutro Abs: 1.8 10*3/uL (ref 1.7–7.7)
Neutrophils Relative %: 62 %

## 2019-11-22 LAB — BASIC METABOLIC PANEL
Anion gap: 11 (ref 5–15)
BUN: <5 mg/dL — ABNORMAL LOW (ref 8–23)
CO2: 28 mmol/L (ref 22–32)
Calcium: 8.5 mg/dL — ABNORMAL LOW (ref 8.9–10.3)
Chloride: 96 mmol/L — ABNORMAL LOW (ref 98–111)
Creatinine, Ser: 0.38 mg/dL — ABNORMAL LOW (ref 0.44–1.00)
GFR calc Af Amer: >60 mL/min (ref >60)
GFR calc non Af Amer: >60 mL/min (ref >60)
Glucose, Bld: 109 mg/dL — ABNORMAL HIGH (ref 70–99)
Potassium: 3.3 mmol/L — ABNORMAL LOW (ref 3.5–5.1)
Sodium: 135 mmol/L (ref 135–145)

## 2019-11-22 LAB — HEPATIC FUNCTION PANEL
ALT: 14 U/L (ref 0–44)
AST: 27 U/L (ref 15–41)
Albumin: 3 g/dL — ABNORMAL LOW (ref 3.5–5.0)
Alkaline Phosphatase: 72 U/L (ref 38–126)
Bilirubin, Direct: 0.3 mg/dL — ABNORMAL HIGH (ref 0.0–0.2)
Indirect Bilirubin: 0.8 mg/dL (ref 0.3–0.9)
Total Bilirubin: 1.1 mg/dL (ref 0.3–1.2)
Total Protein: 7.8 g/dL (ref 6.5–8.1)

## 2019-11-22 LAB — SARS CORONAVIRUS 2 BY RT PCR (HOSPITAL ORDER, PERFORMED IN ~~LOC~~ HOSPITAL LAB): SARS Coronavirus 2: NEGATIVE

## 2019-11-22 LAB — PROTIME-INR
INR: 1.2 (ref 0.8–1.2)
Prothrombin Time: 14.9 seconds (ref 11.4–15.2)

## 2019-11-22 LAB — VITAMIN B12: Vitamin B-12: 798 pg/mL (ref 180–914)

## 2019-11-22 LAB — PHOSPHORUS: Phosphorus: 3.2 mg/dL (ref 2.5–4.6)

## 2019-11-22 LAB — TROPONIN I (HIGH SENSITIVITY): Troponin I (High Sensitivity): 30 ng/L — ABNORMAL HIGH (ref <18)

## 2019-11-22 LAB — FOLATE: Folate: 40 ng/mL (ref >5.9)

## 2019-11-22 LAB — ETHANOL: Alcohol, Ethyl (B): <10 mg/dL (ref <10)

## 2019-11-22 LAB — HIV ANTIBODY (ROUTINE TESTING W REFLEX): HIV Screen 4th Generation wRfx: NONREACTIVE

## 2019-11-22 IMAGING — CT CT ABD-PELV W/ CM
2 of 5 series · 14 of 46 positions shown, 16 images · IV contrast (omnipaque)
Comparison: CT [DATE]

CLINICAL DATA: Abdominal pain and nausea

EXAM:
CT ABDOMEN AND PELVIS WITH CONTRAST
TECHNIQUE: Multidetector CT imaging of the abdomen and pelvis was performed
using the standard protocol following bolus administration of
intravenous contrast.
CONTRAST:  100mL OMNIPAQUE IOHEXOL 300 MG/ML  SOLN

[Series 2: axial st · axial · 0.73mm/px · z∈[-167,+198]mm · 11 of 83 slices shown, 13 images]
[im 5/83  soft-tissue]
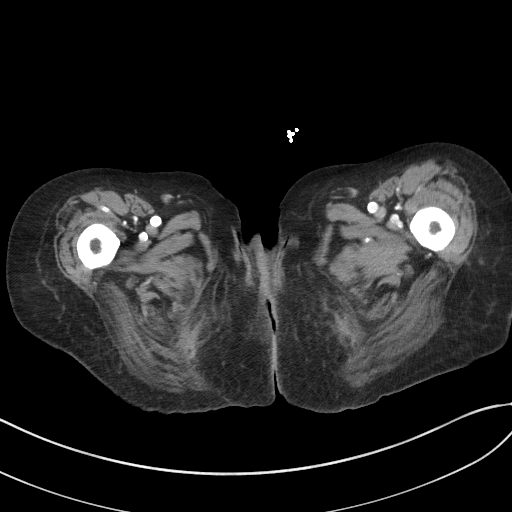
[im 5/83  bone]
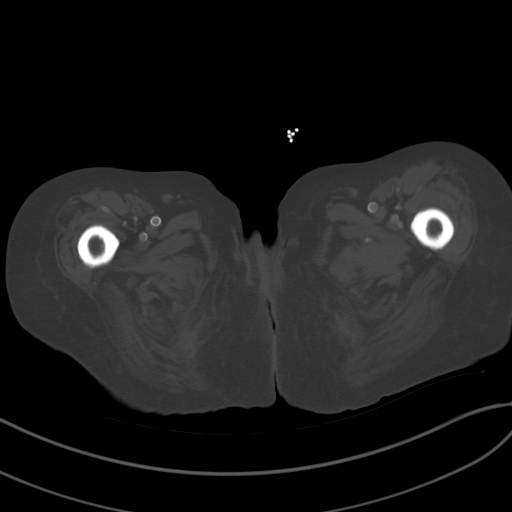
[im 13/83  soft-tissue]
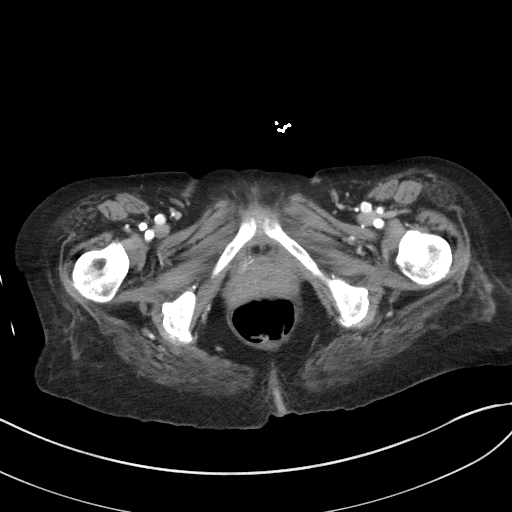
[im 21/83  soft-tissue]
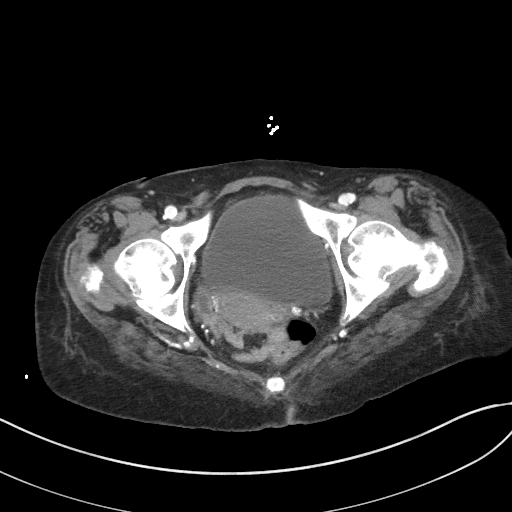
[im 29/83  soft-tissue]
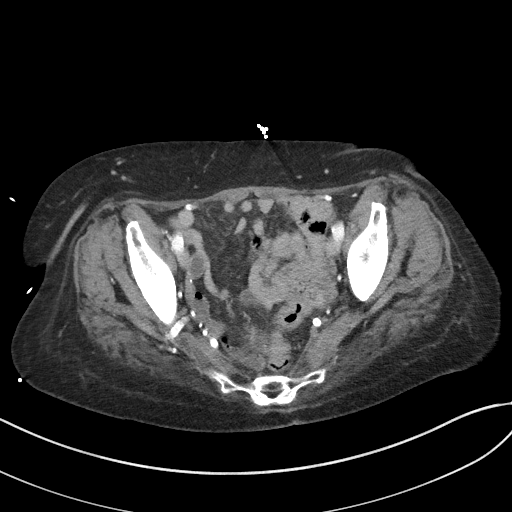
[im 33/83  soft-tissue]
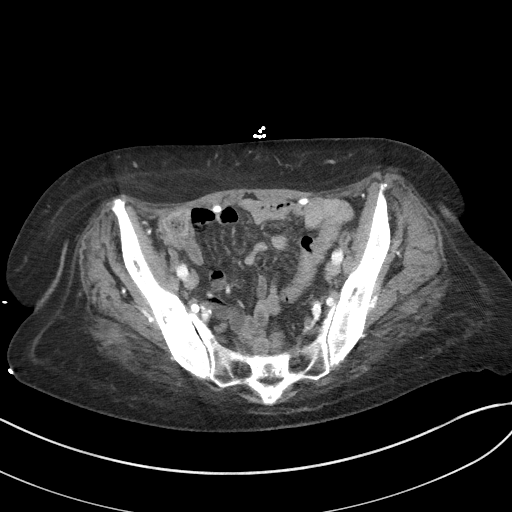
[im 42/83  soft-tissue]
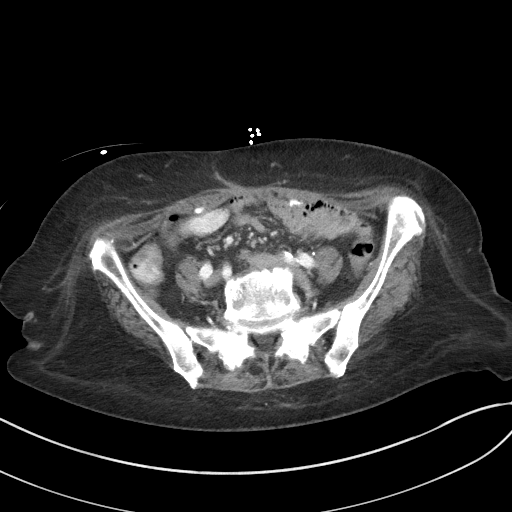
[im 50/83  soft-tissue]
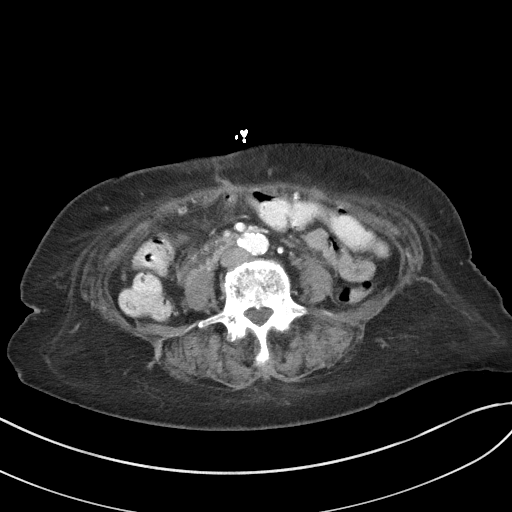
[im 54/83  soft-tissue]
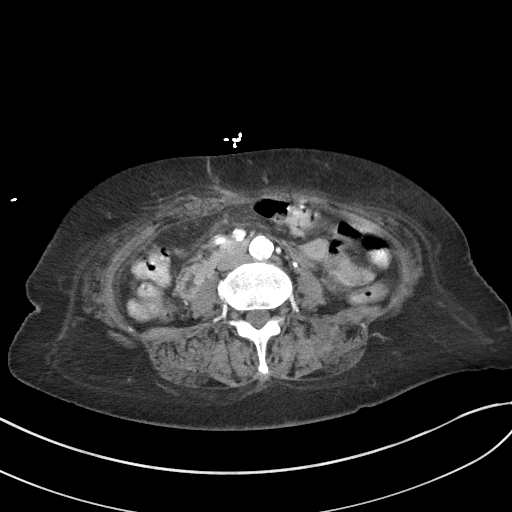
[im 62/83  soft-tissue]
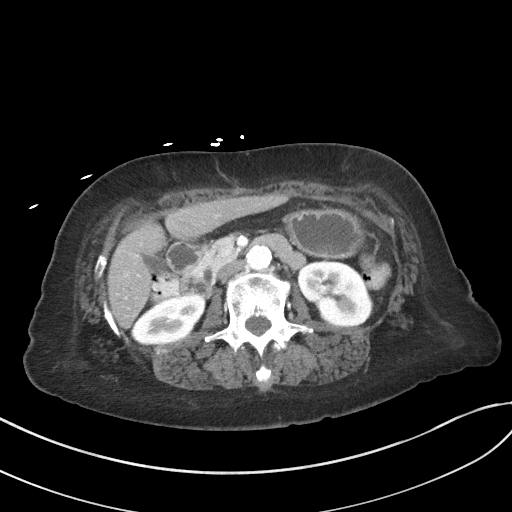
[im 62/83  bone]
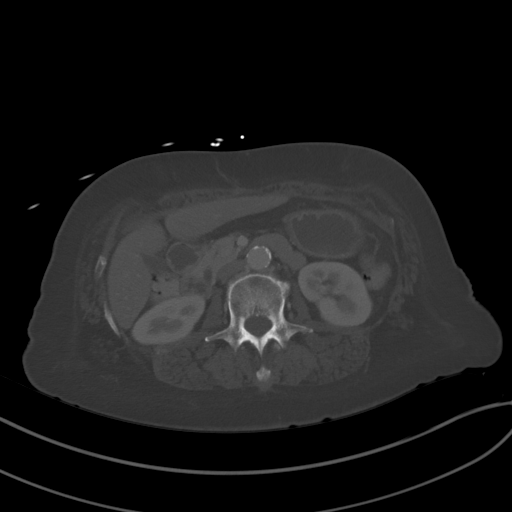
[im 70/83  soft-tissue]
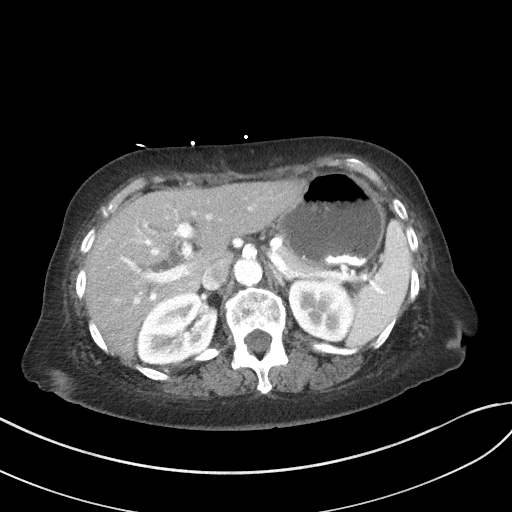
[im 78/83  soft-tissue]
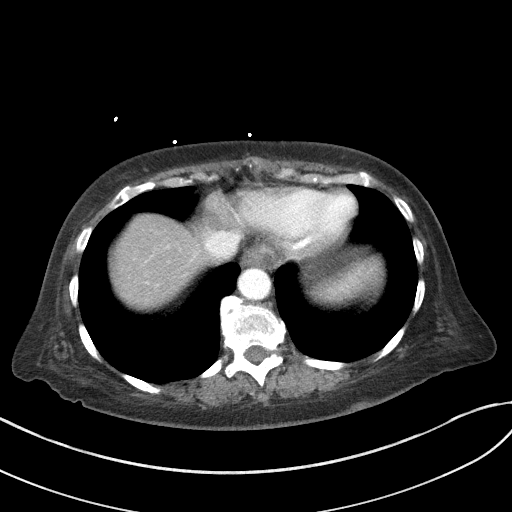

[Series 5: coronal st · coronal · 0.65mm/px · 3 of 65 slices shown]
[im 22/65  soft-tissue]
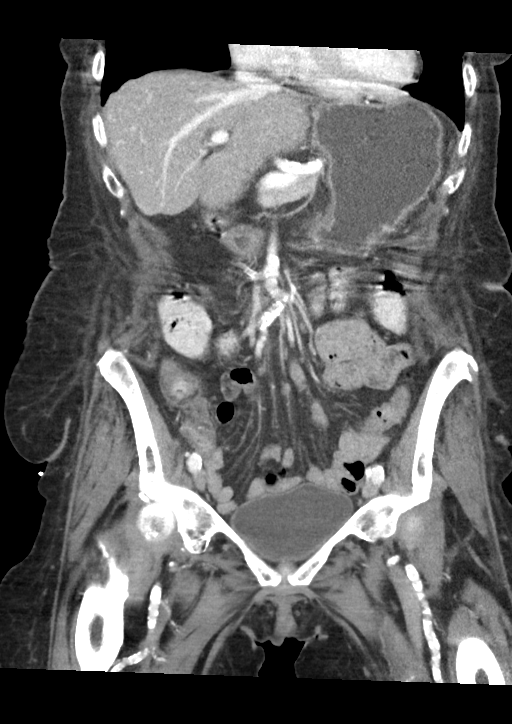
[im 29/65  soft-tissue]
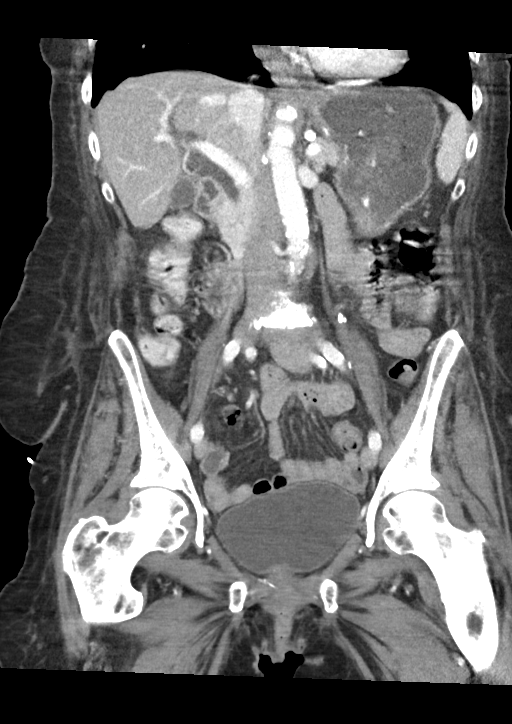
[im 36/65  soft-tissue]
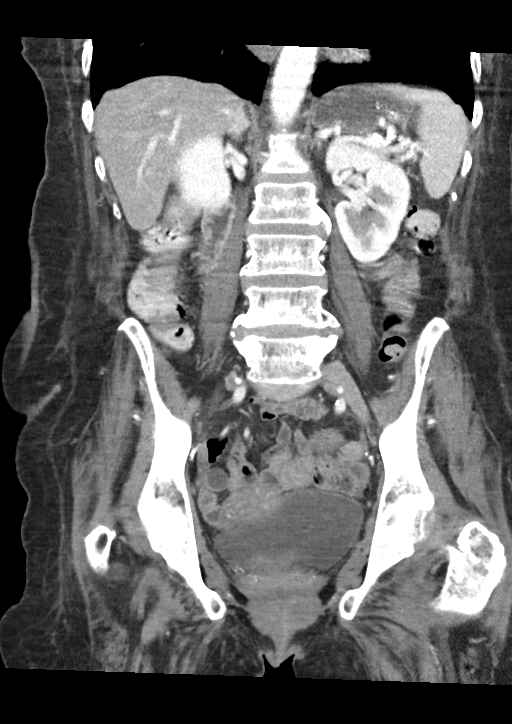

[14 of 46 positions shown; findings below may reference images not displayed]

FINDINGS: Lower chest: Mild centrilobular emphysematous changes in the
otherwise clear lung bases. No consolidation or pleural effusion.
Cardiac size at the upper limits of normal. Persistent marked
thickening of the distal thoracic esophagus and GE junction. Suspect
some varicosities as well with serpiginous hyperdensity.

Hepatobiliary: Diffuse hepatic hypoattenuation compatible with
hepatic steatosis. Gallbladder decompressed. No pericholecystic
fluid or inflammation. No visible calcified gallstones. Moderate
intra and extrahepatic biliary ductal dilatation with distension of
the common bile duct to 10 mm in maximal diameter although this is
similar to the comparison study with smooth distal tapering at the
level of the head of the pancreas. Similar appearance as remote is
[8X]. No visible calcified intraductal gallstones.

Pancreas: Moderate pancreatic atrophy. No inflammation, ductal
dilatation or discernible lesions.

Spleen: Normal in size without focal abnormality.

Adrenals/Urinary Tract: Normal adrenal glands. Kidneys enhance and
excrete symmetrically and uniformly. No concerning renal lesions. No
visible urolithiasis or hydronephrosis. Urinary bladder is
unremarkable.

Stomach/Bowel: Distal esophageal thickening, as above. Nonspecific
layering hyperdense material within the dependent portion of the
stomach could reflect ingested material such as bismuth containing
products. However more intermediate hyperdense material in a pseudo
vessel configuration could reflect active contrast extravasation in
the stomach arising from a small intramural enhancing focus. With
numerous adjacent gastrosplenic collaterals. Distal stomach is more
unremarkable in appearance. Duodenum takes a normal course across
the midline abdomen. No small bowel thickening or dilatation. A
normal appendix is visualized. No colonic dilatation or wall
thickening.

Vascular/Lymphatic: Atherosclerotic calcifications throughout the
abdominal aorta and branch vessels. No aneurysm or ectasia.
Paraesophageal and gastric collaterals, as above with vascular
findings in the stomach detailed above as well. Major venous
structures are grossly patent.No enlarged abdominopelvic lymph
nodes.

Reproductive: Anteverted uterus with parametrial vascular
calcification, typically seen in the setting of diabetes. No
concerning adnexal abnormalities.

Other: Mild body wall edema. Anterior abdominal wall laxity.
Postsurgical changes from prior vertical midline incision.

Musculoskeletal: The osseous structures appear diffusely
demineralized which may limit detection of small or nondisplaced
fractures. No acute osseous abnormality or suspicious osseous
lesion. Multilevel degenerative changes are present in the imaged
portions of the spine. Additional degenerative changes in the hips
and pelvis.
IMPRESSION: 1. Persistent marked thickening of the distal thoracic esophagus and
GE junction, with some adjacent varicosities and serpiginous
hyperdensity. Could reflect esophagitis though should be further
evaluated with endoscopy.
2. Nonspecific serpiginous hyperdensity in the stomach, arising from
an intramural hyperdense focus, could raise suspicion for possible
bleeding gastric varix or ZUR lesion. Of note patient has
previously undergone bleeding gastric ulcer consider NG placement
and assessment of the gastric contents. If hemorrhagic, urgent
endoscopy is warranted.
3. Hepatic steatosis.
4. Moderate intra and extrahepatic biliary ductal dilatation with
smooth distal tapering at the level of the head of the pancreas.
Similar appearance as remote is [8X]. Correlate for finding
5. Aortic Atherosclerosis ([8X]-[8X]).

## 2019-11-22 IMAGING — DX DG ABDOMEN 1V
1 series · 1 of 1 positions shown · non-contrast
Comparison: None.

CLINICAL DATA: NG tube placement

EXAM:
ABDOMEN - 1 VIEW

[abdomen supine]
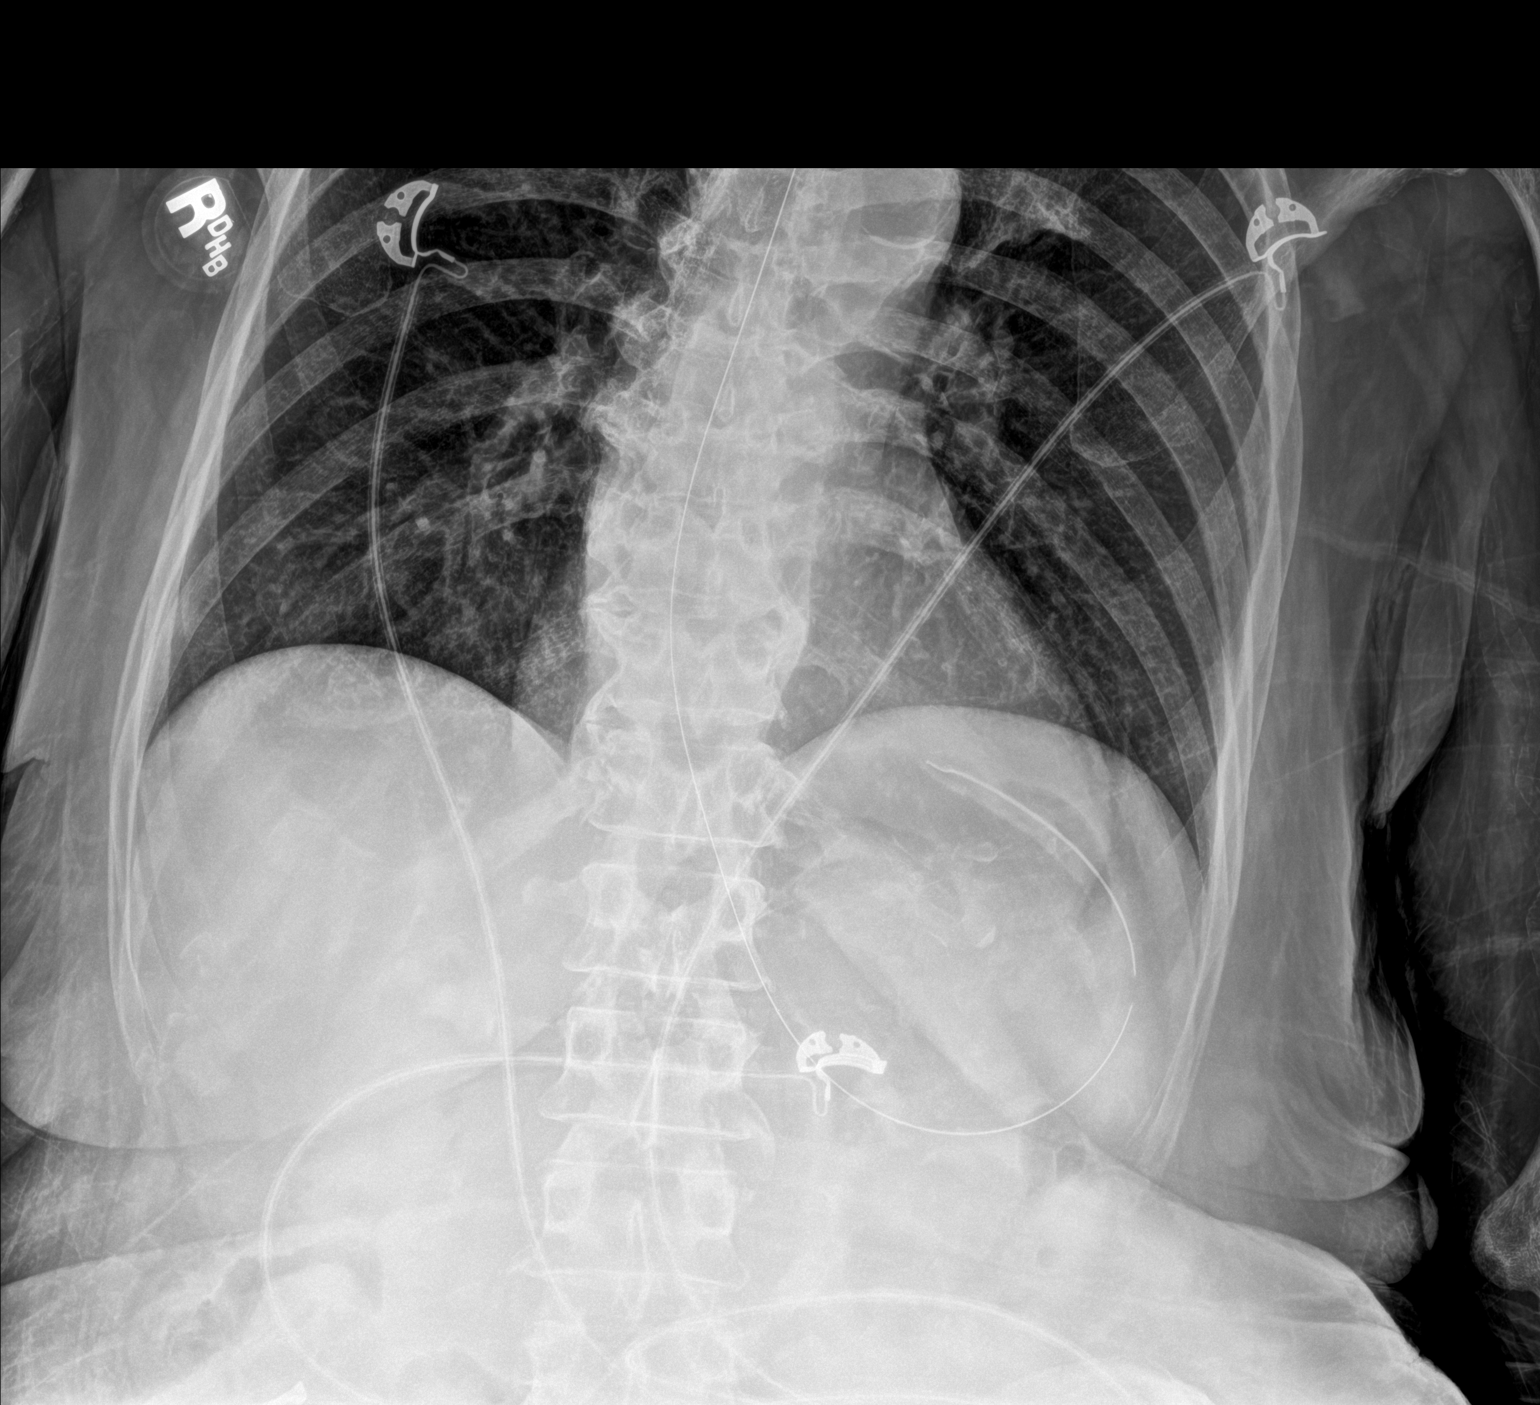

[1 of 1 positions shown; findings below may reference images not displayed]

FINDINGS: NG tube tip and side port project within the stomach. Lung bases are
clear.
IMPRESSION: NG tube tip and side port in the stomach.

## 2019-11-22 MED ORDER — LORAZEPAM 2 MG/ML IJ SOLN: 0.0000 mg | Freq: Two times a day (BID) | INTRAMUSCULAR | Status: DC

## 2019-11-22 MED ORDER — SODIUM CHLORIDE 0.9 % IV SOLN: INTRAVENOUS | Status: DC

## 2019-11-22 MED ORDER — LORAZEPAM 2 MG/ML IJ SOLN: 0.0000 mg | Freq: Four times a day (QID) | INTRAMUSCULAR | Status: AC

## 2019-11-22 MED ORDER — POTASSIUM CHLORIDE 20 MEQ PO PACK
40.0000 meq | PACK | Freq: Once | ORAL | Status: AC
  Administered 2019-11-22: 40 meq via ORAL
  Filled 2019-11-22: qty 2

## 2019-11-22 MED ORDER — POTASSIUM CHLORIDE CRYS ER 20 MEQ PO TBCR
40.0000 meq | EXTENDED_RELEASE_TABLET | Freq: Once | ORAL | Status: AC
  Administered 2019-11-22: 40 meq via ORAL
  Filled 2019-11-22: qty 2

## 2019-11-22 MED ORDER — MAGNESIUM SULFATE 2 GM/50ML IV SOLN
2.0000 g | Freq: Once | INTRAVENOUS | Status: AC
  Administered 2019-11-22: 2 g via INTRAVENOUS
  Filled 2019-11-22: qty 50

## 2019-11-22 MED ORDER — MORPHINE SULFATE (PF) 2 MG/ML IV SOLN: 2.0000 mg | INTRAVENOUS | Status: DC | PRN

## 2019-11-22 MED ORDER — LORAZEPAM 1 MG PO TABS: 1.0000 mg | ORAL_TABLET | ORAL | Status: AC | PRN

## 2019-11-22 MED ORDER — AMLODIPINE BESYLATE 5 MG PO TABS
5.0000 mg | ORAL_TABLET | Freq: Every day | ORAL | Status: DC
  Administered 2019-11-22: 5 mg via ORAL
  Filled 2019-11-22: qty 1

## 2019-11-22 MED ORDER — POTASSIUM CHLORIDE 10 MEQ/100ML IV SOLN
10.0000 meq | Freq: Once | INTRAVENOUS | Status: AC
  Administered 2019-11-22: 10 meq via INTRAVENOUS
  Filled 2019-11-22: qty 100

## 2019-11-22 MED ORDER — OCTREOTIDE LOAD VIA INFUSION
50.0000 ug | Freq: Once | INTRAVENOUS | Status: AC
  Administered 2019-11-22: 50 ug via INTRAVENOUS
  Filled 2019-11-22: qty 25

## 2019-11-22 MED ORDER — IOHEXOL 300 MG/ML  SOLN
100.0000 mL | Freq: Once | INTRAMUSCULAR | Status: AC | PRN
  Administered 2019-11-22: 100 mL via INTRAVENOUS

## 2019-11-22 MED ORDER — SODIUM CHLORIDE 0.9 % IV SOLN
50.0000 ug/h | INTRAVENOUS | Status: DC
  Administered 2019-11-22: 50 ug/h via INTRAVENOUS
  Filled 2019-11-22 (×2): qty 1

## 2019-11-22 MED ORDER — THIAMINE HCL 100 MG/ML IJ SOLN
Freq: Once | INTRAVENOUS | Status: AC
  Filled 2019-11-22: qty 1000

## 2019-11-22 MED ORDER — POTASSIUM CHLORIDE 10 MEQ/100ML IV SOLN: 10.0000 meq | INTRAVENOUS | Status: DC

## 2019-11-22 MED ORDER — FOLIC ACID 1 MG PO TABS
1.0000 mg | ORAL_TABLET | Freq: Every day | ORAL | Status: DC
  Administered 2019-11-22 – 2019-11-25 (×4): 1 mg via ORAL
  Filled 2019-11-22 (×4): qty 1

## 2019-11-22 MED ORDER — PANTOPRAZOLE SODIUM 40 MG IV SOLR
40.0000 mg | INTRAVENOUS | Status: DC
  Administered 2019-11-22 – 2019-11-23 (×2): 40 mg via INTRAVENOUS
  Filled 2019-11-22 (×2): qty 40

## 2019-11-22 MED ORDER — THIAMINE HCL 100 MG PO TABS
100.0000 mg | ORAL_TABLET | Freq: Every day | ORAL | Status: DC
  Administered 2019-11-22 – 2019-11-25 (×4): 100 mg via ORAL
  Filled 2019-11-22 (×4): qty 1

## 2019-11-22 MED ORDER — ADULT MULTIVITAMIN W/MINERALS CH
1.0000 | ORAL_TABLET | Freq: Every day | ORAL | Status: DC
  Administered 2019-11-22 – 2019-11-25 (×5): 1 via ORAL
  Filled 2019-11-22 (×5): qty 1

## 2019-11-22 MED ORDER — THIAMINE HCL 100 MG/ML IJ SOLN
100.0000 mg | Freq: Every day | INTRAMUSCULAR | Status: DC
  Administered 2019-11-25: 100 mg via INTRAVENOUS
  Filled 2019-11-22: qty 2

## 2019-11-22 MED ORDER — LORAZEPAM 2 MG/ML IJ SOLN: 1.0000 mg | INTRAMUSCULAR | Status: AC | PRN

## 2019-11-22 MED ORDER — OXYCODONE HCL 5 MG PO TABS
5.0000 mg | ORAL_TABLET | Freq: Four times a day (QID) | ORAL | Status: DC | PRN
  Administered 2019-11-22: 22:00:00 5 mg via ORAL
  Filled 2019-11-22: qty 1

## 2019-11-22 MED ORDER — LABETALOL HCL 5 MG/ML IV SOLN
10.0000 mg | INTRAVENOUS | Status: DC | PRN
  Administered 2019-11-22: 5 mg via INTRAVENOUS
  Filled 2019-11-22: qty 4

## 2019-11-22 NOTE — Consult Note (Signed)
Jonathon Bellows , MD [address] [place], [place], [place], [address] [address], [place], [place], [place], [address] Phone: [phone]  Fax: [phone]  Consultation  Referring Provider:     Dr Leslye Peer Primary Care Physician:  Patient, No Pcp Per Primary Gastroenterologist:  Dr. Clovis Cao at [facility]      Reason for Consultation:     Abnormal imaging   Date of Admission:  11/22/2019 Date of Consultation:  11/22/2019         HPI:   Donna Aguirre is a 64 y.o. female previously been seen by GI [facility]. She has a history of alcohol abuse , EGD 10/2018 : iron deficiency anemia and follow up for gastric ulcer with perforation in 09/2018 . Stricture seen in mid esophagus but was not dilated due to food in the stomach and inability to intubate at that facility , plan was for repeat EGD and colonoscopy for screening , was iron deficiency with anemia at that time. She presented to the ER with abdominal pain and vomiting. CT abdomen and pelvis showed marked thickening of the distal esophagus and GE junction , ? Serpiginous density in the stomach , dilated intra and extra hepatic ductal dilation similar to appearance to 2017 .   On admission Hb 12.5 grams , BUN <5. Hepatic function panel was normal.    She states she has upper abdominal pain long term and dysphagia for months. No weight loss. When I went into see her she was having a liquid diet, no other complaints. Denies any NSAID use.  Past Medical History:  Diagnosis Date  . Arthritis   . Diabetes mellitus without complication ([place])   . Gout   . Hypertension     Past Surgical History:  Procedure Laterality Date  . perforated gastric ulcer      Prior to Admission medications   Medication Sig Start Date End Date Taking? Authorizing Provider  folic acid (FOLVITE) 1 MG tablet Take 1 tablet (1 mg total) by mouth daily. Patient not taking: Reported on 11/22/2019 03/08/19   Vaughan Basta, MD  lisinopril (ZESTRIL) 10 MG tablet Take 1  tablet (10 mg total) by mouth daily. Patient not taking: Reported on 11/22/2019 03/08/19   Vaughan Basta, MD  pantoprazole (PROTONIX) 40 MG tablet Take 1 tablet (40 mg total) by mouth 2 (two) times daily before a meal. Patient not taking: Reported on 11/22/2019 03/07/19   Vaughan Basta, MD  thiamine 100 MG tablet Take 1 tablet (100 mg total) by mouth daily. Patient not taking: Reported on 11/22/2019 03/08/19   Vaughan Basta, MD    History reviewed. No pertinent family history.   Social History   Tobacco Use  . Smoking status: Current Some Day Smoker    Types: Cigarettes  . Smokeless tobacco: Current User  Substance Use Topics  . Alcohol use: Yes    Comment: one 40 oz  . Drug use: No    Allergies as of 11/21/2019  . (No Known Allergies)    Review of Systems:    All systems reviewed and negative except where noted in HPI.   Physical Exam:  Vital signs in last 24 hours: Temp:  [98.2 F (36.8 C)-98.4 F (36.9 C)] 98.2 F (36.8 C) (06/24 2322) Pulse Rate:  [64-91] 70 (06/25 0659) Resp:  [14-19] 16 (06/25 0754) BP: (125-187)/(76-98) 125/76 (06/25 0754) SpO2:  [100 %] 100 % (06/25 0754) Weight:  [66.2 kg] 66.2 kg (06/24 2120)   General:   Pleasant, cooperative  in NAD Head:  Normocephalic and atraumatic. Eyes:   No icterus.   Conjunctiva pink. PERRLA. Ears:  Normal auditory acuity. Neck:  Supple; no masses or thyroidomegaly Lungs: Respirations even and unlabored. Lungs clear to auscultation bilaterally.   No wheezes, crackles, or rhonchi.  Heart:  Regular rate and rhythm;  Without murmur, clicks, rubs or gallops Abdomen: midline scar Soft, nondistended, nontender. Normal bowel sounds. No appreciable masses or hepatomegaly.  No rebound or guarding.  Neurologic:  Alert and oriented x3;  grossly normal neurologically. Skin:  Intact without significant lesions or rashes. Cervical Nodes:  No significant cervical adenopathy. Psych:  Alert and cooperative.  Normal affect.  LAB RESULTS: Recent Labs    11/21/19 2122 11/22/19 0558 11/22/19 0957  WBC 2.7* 3.2* 2.8*  HGB 11.0* 12.5 12.5  HCT 31.1* 34.8* 35.0*  PLT 145* 126* 151   BMET Recent Labs    11/21/19 2122 11/22/19 0957  NA 135 135  K 2.7* 3.3*  CL 98 96*  CO2 26 28  GLUCOSE 116* 109*  BUN <5* <5*  CREATININE 0.49 0.38*  CALCIUM 8.4* 8.5*   LFT Recent Labs    11/21/19 2332  PROT 7.8  ALBUMIN 3.0*  AST 27  ALT 14  ALKPHOS 72  BILITOT 1.1  BILIDIR 0.3*  IBILI 0.8   PT/INR Recent Labs    11/21/19 2122  LABPROT 14.9  INR 1.2    STUDIES: DG Abdomen 1 View  Result Date: 11/22/2019 CLINICAL DATA:  NG tube placement EXAM: ABDOMEN - 1 VIEW COMPARISON:  None. FINDINGS: NG tube tip and side port project within the stomach. Lung bases are clear. IMPRESSION: NG tube tip and side port in the stomach. Electronically Signed   By: Ulyses Jarred M.D.   On: 11/22/2019 02:22   CT ABDOMEN PELVIS W CONTRAST  Result Date: 11/22/2019 CLINICAL DATA:  Abdominal pain and nausea EXAM: CT ABDOMEN AND PELVIS WITH CONTRAST TECHNIQUE: Multidetector CT imaging of the abdomen and pelvis was performed using the standard protocol following bolus administration of intravenous contrast. CONTRAST:  155mL OMNIPAQUE IOHEXOL 300 MG/ML  SOLN COMPARISON:  CT 01/26/2019 FINDINGS: Lower chest: Mild centrilobular emphysematous changes in the otherwise clear lung bases. No consolidation or pleural effusion. Cardiac size at the upper limits of normal. Persistent marked thickening of the distal thoracic esophagus and GE junction. Suspect some varicosities as well with serpiginous hyperdensity. Hepatobiliary: Diffuse hepatic hypoattenuation compatible with hepatic steatosis. Gallbladder decompressed. No pericholecystic fluid or inflammation. No visible calcified gallstones. Moderate intra and extrahepatic biliary ductal dilatation with distension of the common bile duct to 10 mm in maximal diameter although this  is similar to the comparison study with smooth distal tapering at the level of the head of the pancreas. Similar appearance as remote is 2017. No visible calcified intraductal gallstones. Pancreas: Moderate pancreatic atrophy. No inflammation, ductal dilatation or discernible lesions. Spleen: Normal in size without focal abnormality. Adrenals/Urinary Tract: Normal adrenal glands. Kidneys enhance and excrete symmetrically and uniformly. No concerning renal lesions. No visible urolithiasis or hydronephrosis. Urinary bladder is unremarkable. Stomach/Bowel: Distal esophageal thickening, as above. Nonspecific layering hyperdense material within the dependent portion of the stomach could reflect ingested material such as bismuth containing products. However more intermediate hyperdense material in a pseudo vessel configuration could reflect active contrast extravasation in the stomach arising from a small intramural enhancing focus. With numerous adjacent gastrosplenic collaterals. Distal stomach is more unremarkable in appearance. Duodenum takes a normal course across the midline abdomen. No small bowel  thickening or dilatation. A normal appendix is visualized. No colonic dilatation or wall thickening. Vascular/Lymphatic: Atherosclerotic calcifications throughout the abdominal aorta and branch vessels. No aneurysm or ectasia. Paraesophageal and gastric collaterals, as above with vascular findings in the stomach detailed above as well. Major venous structures are grossly patent.No enlarged abdominopelvic lymph nodes. Reproductive: Anteverted uterus with parametrial vascular calcification, typically seen in the setting of diabetes. No concerning adnexal abnormalities. Other: Mild body wall edema. Anterior abdominal wall laxity. Postsurgical changes from prior vertical midline incision. Musculoskeletal: The osseous structures appear diffusely demineralized which may limit detection of small or nondisplaced fractures. No  acute osseous abnormality or suspicious osseous lesion. Multilevel degenerative changes are present in the imaged portions of the spine. Additional degenerative changes in the hips and pelvis. IMPRESSION: 1. Persistent marked thickening of the distal thoracic esophagus and GE junction, with some adjacent varicosities and serpiginous hyperdensity. Could reflect esophagitis though should be further evaluated with endoscopy. 2. Nonspecific serpiginous hyperdensity in the stomach, arising from an intramural hyperdense focus, could raise suspicion for possible bleeding gastric varix or Dieulafoy lesion. Of note patient has previously undergone bleeding gastric ulcer consider NG placement and assessment of the gastric contents. If hemorrhagic, urgent endoscopy is warranted. 3. Hepatic steatosis. 4. Moderate intra and extrahepatic biliary ductal dilatation with smooth distal tapering at the level of the head of the pancreas. Similar appearance as remote is 2017. Correlate for finding 5. Aortic Atherosclerosis (ICD10-I70.0). Electronically Signed   By: Lovena Le M.D.   On: 11/22/2019 01:37      Impression / Plan:   Donna Aguirre is a 64 y.o. 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 Ct scan shows significant thickening of the esophagus and GE junction and some abnormality in the stomach concerning for bleeding. Unlikely to be bleeding as the Hb is stable and no elevated in BUN/CR ratio. Very likely esophagitis but would need to rule out malignancy . H/o dysphagia for over a year.   Plan  1. IV PPI  2. EGD tomorrow with Dr Marius Ditch- would need dilation . (cannot do EGD today as she was having a meal when I went into see her )  3. Check iron studies, b12 ,folate as she has microcytosis 4. Stop all alcohol use, IV vitamins.  5. H pylori stool antigen .   I have  discussed alternative options, risks & benefits,  which include, but are not limited to, bleeding, infection, perforation,respiratory complication & drug reaction.  The patient agrees with this plan & written consent will be obtained.    Thank you for involving me in the care of this patient.      LOS: 0 days   Jonathon Bellows, MD  11/22/2019, 2:01 PM

## 2019-11-22 NOTE — ED Notes (Signed)
NG placed with this RN and Mickle Plumb, placement confirmed with auscultation, KUB ordered verbal Dr Alfred Levins. 174ml black drainage noted, Dr Alfred Levins aware

## 2019-11-22 NOTE — Progress Notes (Signed)
Report given to [facility]. Patient transported to room.

## 2019-11-22 NOTE — Plan of Care (Signed)
  Problem: Clinical Measurements: Goal: Respiratory complications will improve Outcome: Progressing Goal: Cardiovascular complication will be avoided Outcome: Progressing   

## 2019-11-22 NOTE — ED Notes (Signed)
Pt spitting into a hat. Pt given an emesis bag. Pt was able to swallow potassium tabs. Pt asking if she will stay overnight. Told pt maybe.

## 2019-11-22 NOTE — ED Notes (Signed)
EDP gave VORB to remove NG tube.

## 2019-11-22 NOTE — ED Provider Notes (Signed)
[facility] Emergency Department Provider Note  ____________________________________________  Time seen: Approximately 12:49 AM  I have reviewed the triage vital signs and the nursing notes.   HISTORY  Chief Complaint Abdominal Pain   HPI Donna Aguirre is a 64 y.o. female history of alcohol abuse, diabetes, hypertension, esophageal stenosis who presents for evaluation of abdominal pain.  Patient is extremely poor historian.   She is complaining of upper abdominal pain and nausea.  She reports that her symptoms come and go for several months.  She comes in today because she is tired of having pain.  She denies any acute changes on her pain today.  She is really unable to describe her pain other than "it feels like a kidney stone."  She reports that the pain does not go to her flank or her back.  No chest pain, no shortness of breath, no cough, no fever or chills.  She reports having a couple of episodes of nonbloody nonbilious emesis today.  She will not provide any details on her drinking history.  She does report not drinking anything over the last 48 hours.  She denies coffee-ground emesis, melena, hematochezia, or hematemesis.  No diarrhea or constipation.  Past Medical History:  Diagnosis Date  . Arthritis   . Diabetes mellitus without complication ([place])   . Gout   . Hypertension     Patient Active Problem List   Diagnosis Date Noted  . Hyponatremia 11/16/2018    No past surgical history on file.  Prior to Admission medications   Medication Sig Start Date End Date Taking? Authorizing Provider  folic acid (FOLVITE) 1 MG tablet Take 1 tablet (1 mg total) by mouth daily. 03/08/19   Vaughan Basta, MD  lisinopril (ZESTRIL) 10 MG tablet Take 1 tablet (10 mg total) by mouth daily. 03/08/19   Vaughan Basta, MD  pantoprazole (PROTONIX) 40 MG tablet Take 1 tablet (40 mg total) by mouth 2 (two) times daily before a meal. 03/07/19   Vaughan Basta, MD  thiamine 100 MG tablet Take 1 tablet (100 mg total) by mouth daily. 03/08/19   Vaughan Basta, MD    Allergies Patient has no known allergies.  No family history on file.  Social History Social History   Tobacco Use  . Smoking status: Current Some Day Smoker    Types: Cigarettes  . Smokeless tobacco: Current User  Substance Use Topics  . Alcohol use: Yes    Comment: one 40 oz  . Drug use: No    Review of Systems  Constitutional: Negative for fever. Eyes: Negative for visual changes. ENT: Negative for sore throat. Neck: No neck pain  Cardiovascular: Negative for chest pain. Respiratory: Negative for shortness of breath. Gastrointestinal: + abdominal pain, nausea, and vomiting. No diarrhea. Genitourinary: Negative for dysuria. Musculoskeletal: Negative for back pain. Skin: Negative for rash. Neurological: Negative for headaches, weakness or numbness. Psych: No SI or HI  ____________________________________________   PHYSICAL EXAM:  VITAL SIGNS: ED Triage Vitals [11/21/19 2120]  Enc Vitals Group     BP (!) 182/91     Pulse Rate 91     Resp 18     Temp 98.4 F (36.9 C)     Temp Source Oral     SpO2 100 %     Weight 146 lb (66.2 kg)     Height 5\' 4"  (1.626 m)     Head Circumference      Peak Flow  Pain Score 10     Pain Loc      Pain Edu?      Excl. in [place]?     Constitutional: Alert and oriented to self and place, poor historian.  HEENT:      Head: Normocephalic and atraumatic.         Eyes: Conjunctivae are normal. Sclera is non-icteric.       Mouth/Throat: Mucous membranes are moist.       Neck: Supple with no signs of meningismus. Cardiovascular: Regular rate and rhythm. No murmurs, gallops, or rubs. 2+ symmetrical distal pulses are present in all extremities. No JVD. Respiratory: Normal respiratory effort. Lungs are clear to auscultation bilaterally. No wheezes, crackles, or rhonchi.  Gastrointestinal: Soft, no  significant tenderness illicit on abdominal exam. No rebound or guarding. Genitourinary: No CVA tenderness. Musculoskeletal: No edema, cyanosis, or erythema of extremities. Neurologic: Slurred speech. Face is symmetric. Moving all extremities. No gross focal neurologic deficits are appreciated. Skin: Skin is warm, dry and intact. No rash noted. Psychiatric: Mood and affect are normal. Speech and behavior are normal.  ____________________________________________   LABS (all labs ordered are listed, but only abnormal results are displayed)  Labs Reviewed  BASIC METABOLIC PANEL - Abnormal; Notable for the following components:      Result Value   Potassium 2.7 (*)    Glucose, Bld 116 (*)    BUN <5 (*)    Calcium 8.4 (*)    All other components within normal limits  CBC - Abnormal; Notable for the following components:   WBC 2.7 (*)    Hemoglobin 11.0 (*)    HCT 31.1 (*)    RDW 18.0 (*)    Platelets 145 (*)    All other components within normal limits  URINALYSIS, COMPLETE (UACMP) WITH MICROSCOPIC - Abnormal; Notable for the following components:   Color, Urine YELLOW (*)    APPearance HAZY (*)    Leukocytes,Ua SMALL (*)    Bacteria, UA RARE (*)    All other components within normal limits  HEPATIC FUNCTION PANEL - Abnormal; Notable for the following components:   Albumin 3.0 (*)    Bilirubin, Direct 0.3 (*)    All other components within normal limits  MAGNESIUM - Abnormal; Notable for the following components:   Magnesium 1.5 (*)    All other components within normal limits  TROPONIN I (HIGH SENSITIVITY) - Abnormal; Notable for the following components:   Troponin I (High Sensitivity) 30 (*)    All other components within normal limits  URINE CULTURE  LIPASE, BLOOD  DIFFERENTIAL  PROTIME-INR  ETHANOL  TROPONIN I (HIGH SENSITIVITY)   ____________________________________________  EKG  ED ECG REPORT I, Rudene Re, the attending physician, personally viewed and  interpreted this ECG.  Normal sinus rhythm, rate of 87, prolonged QTC, normal axis, anterior Q waves, no ST elevations or depressions.  Prolonged QTC is new when compared to prior. ____________________________________________  RADIOLOGY  I have personally reviewed the images performed during this visit and I agree with the Radiologist's read.   Interpretation by Radiologist:  No results found.    ____________________________________________   PROCEDURES  Procedure(s) performed:yes .1-3 Lead EKG Interpretation Performed by: Rudene Re, MD Authorized by: Rudene Re, MD     Interpretation: non-specific     ECG rate assessment: normal     Rhythm: sinus rhythm     Ectopy: none     Critical Care performed: yes  CRITICAL CARE Performed by: Rudene Re  ?  Total critical care time: 35 min  Critical care time was exclusive of separately billable procedures and treating other patients.  Critical care was necessary to treat or prevent imminent or life-threatening deterioration.  Critical care was time spent personally by me on the following activities: development of treatment plan with patient and/or surrogate as well as nursing, discussions with consultants, evaluation of patient's response to treatment, examination of patient, obtaining history from patient or surrogate, ordering and performing treatments and interventions, ordering and review of laboratory studies, ordering and review of radiographic studies, pulse oximetry and re-evaluation of patient's condition.  ____________________________________________   INITIAL IMPRESSION / ASSESSMENT AND PLAN / ED COURSE   64 y.o. female history of alcohol abuse, diabetes, hypertension, esophageal stenosis who presents for evaluation of abdominal pain and nausea.  Patient is extremely poor historian however does report that her symptoms have been ongoing for several months and she came in today because she is  tired of hurting.  On exam she does not seem to be in any distress, her abdomen is soft with no distention, normal bowel signs, no significant tenderness on palpation.  Differential diagnosis including GERD, gastritis versus peptic ulcer disease versus pancreatitis versus gallbladder etiology versus SBO versus diverticulitis versus appendicitis versus kidney stone versus hepatitis.  Labs showing hypokalemia with a potassium of 2.7 and new prolonged QT.  Mag levels are pending.  Will supplement p.o. and IV.  UA negative for UTI.  LFTs are within normal limits.  Lipase negative.  Patient has significant abnormalities on her CBC including leukopenia with normal differential, anemia, and thrombocytopenia.  Anemia is unchanged from prior however leukopenia and thrombocytopenia are new when compared to prior.  Her initial troponin was negative at 14 with a repeat of 30.  EKG with no acute ischemic changes.  We will get a CT abdomen pelvis, supplement potassium.  Magnesium is pending.  Old medical records reviewed.  Patient placed on telemetry for close monitoring.  Anticipate admission.     _________________________ 1:36 AM on 11/22/2019 -----------------------------------------  Radiologist called me with concerns of possible contrast into patient's stomach which could be due to active arterial bleed.  Patient reports that her emesis has been yellow, no coffee-ground emesis or hematemesis.  We will place an NG tube for better evaluation.   _________________________ 1:50 AM on 11/22/2019 -----------------------------------------  NG tube was placed and suction green tinged liquid secretion which is Hemoccult negative.  No coffee-ground emesis or blood. _____________________________________________ Please note:  Patient was evaluated in Emergency Department today for the symptoms described in the history of present illness. Patient was evaluated in the context of the global COVID-19 pandemic, which  necessitated consideration that the patient might be at risk for infection with the SARS-CoV-2 virus that causes COVID-19. Institutional protocols and algorithms that pertain to the evaluation of patients at risk for COVID-19 are in a state of rapid change based on information released by regulatory bodies including the CDC and federal and state organizations. These policies and algorithms were followed during the patient's care in the ED.  Some ED evaluations and interventions may be delayed as a result of limited staffing during the pandemic.   [place] Controlled Substance Database was reviewed by me. ____________________________________________   FINAL CLINICAL IMPRESSION(S) / ED DIAGNOSES   Final diagnoses:  Thrombocytopenia (HCC)  Anemia, unspecified type  Leukopenia, unspecified type  Hypokalemia  Prolonged Q-T interval on ECG      NEW MEDICATIONS STARTED DURING THIS VISIT:  ED Discharge Orders  None       Note:  This document was prepared using Dragon voice recognition software and may include unintentional dictation errors.    Alfred Levins, [place], MD 11/22/19 [phone]

## 2019-11-22 NOTE — Progress Notes (Signed)
Patient ID: Donna Aguirre, female   DOB: 1955/10/28, 64 y.o.   MRN: [id number] [facility] PROGRESS NOTE  SUZANNA ZAHN TZG:[id number] DOB: [birth year] DOA: 11/22/2019 PCP: Patient, No Pcp Per  HPI/Subjective: Patient feeling better now than when she came in.  Patient came in with abdominal pain and vomiting.  The patient states that she felt better once they placed the NG tube.  Currently no abdominal pain.  States she has trouble eating.  Objective: Vitals:   11/22/19 0754 11/22/19 1412  BP: 125/76 (!) 124/112  Pulse:  84  Resp: 16 18  Temp:  (!) 97.5 F (36.4 C)  SpO2: 100% 100%    Intake/Output Summary (Last 24 hours) at 11/22/2019 1430 Last data filed at 11/22/2019 1410 Gross per 24 hour  Intake 47.14 ml  Output 1 ml  Net 46.14 ml   Filed Weights   11/21/19 2120  Weight: 66.2 kg    ROS: Review of Systems  Respiratory: Negative for shortness of breath.   Cardiovascular: Negative for chest pain.  Gastrointestinal: Negative for abdominal pain, nausea and vomiting.   Exam: Physical Exam  HENT:  Nose: No mucosal edema.  Mouth/Throat: No oropharyngeal exudate.  Eyes: Pupils are equal, round, and reactive to light. Conjunctivae and lids are normal.  Cardiovascular: Normal rate, regular rhythm, S1 normal and S2 normal. Exam reveals no gallop.  No murmur heard. Respiratory: No respiratory distress. She has no wheezes. She has no rhonchi. She has no rales.  GI: Soft. There is no abdominal tenderness.  Musculoskeletal:     Right ankle: No swelling.     Left ankle: No swelling.  Neurological: She is alert. No cranial nerve deficit.  Skin: Skin is warm. No rash noted.      Data Reviewed: Basic Metabolic Panel: Recent Labs  Lab 11/21/19 2122 11/22/19 0558 11/22/19 0957  NA 135  --  135  K 2.7*  --  3.3*  CL 98  --  96*  CO2 26  --  28  GLUCOSE 116*  --  109*  BUN <5*  --  <5*  CREATININE 0.49  --  0.38*  CALCIUM 8.4*  --  8.5*  MG 1.5*  --  1.9  PHOS   --  3.2  --    Liver Function Tests: Recent Labs  Lab 11/21/19 2332  AST 27  ALT 14  ALKPHOS 72  BILITOT 1.1  PROT 7.8  ALBUMIN 3.0*   Recent Labs  Lab 11/21/19 2122  LIPASE 17   CBC: Recent Labs  Lab 11/21/19 2122 11/22/19 0558 11/22/19 0957  WBC 2.7* 3.2* 2.8*  NEUTROABS 1.8  --   --   HGB 11.0* 12.5 12.5  HCT 31.1* 34.8* 35.0*  MCV 80.4 79.6* 79.7*  PLT 145* 126* 151      Recent Results (from the past 240 hour(s))  SARS Coronavirus 2 by RT PCR (hospital order, performed in [facility] hospital lab) Nasopharyngeal Nasopharyngeal Swab     Status: None   Collection Time: 11/22/19  4:00 AM   Specimen: Nasopharyngeal Swab  Result Value Ref Range Status   SARS Coronavirus 2 NEGATIVE NEGATIVE Final    Comment: (NOTE) SARS-CoV-2 target nucleic acids are NOT DETECTED.  The SARS-CoV-2 RNA is generally detectable in upper and lower respiratory specimens during the acute phase of infection. The lowest concentration of SARS-CoV-2 viral copies this assay can detect is 250 copies / mL. A negative result does not preclude SARS-CoV-2 infection and should not be  used as the sole basis for treatment or other patient management decisions.  A negative result may occur with improper specimen collection / handling, submission of specimen other than nasopharyngeal swab, presence of viral mutation(s) within the areas targeted by this assay, and inadequate number of viral copies (<250 copies / mL). A negative result must be combined with clinical observations, patient history, and epidemiological information.  Fact Sheet for Patients:   StrictlyIdeas.no  Fact Sheet for Healthcare Providers: BankingDealers.co.za  This test is not yet approved or  cleared by the [place] FDA and has been authorized for detection and/or diagnosis of SARS-CoV-2 by FDA under an Emergency Use Authorization (EUA).  This EUA will remain in effect  (meaning this test can be used) for the duration of the COVID-19 declaration under Section 564(b)(1) of the Act, 21 U.S.C. section 360bbb-3(b)(1), unless the authorization is terminated or revoked sooner.  Performed at [facility], [address] [place], [place] [address]      Studies: DG Abdomen 1 View  Result Date: 11/22/2019 CLINICAL DATA:  NG tube placement EXAM: ABDOMEN - 1 VIEW COMPARISON:  None. FINDINGS: NG tube tip and side port project within the stomach. Lung bases are clear. IMPRESSION: NG tube tip and side port in the stomach. Electronically Signed   By: Ulyses Jarred M.D.   On: 11/22/2019 02:22   CT ABDOMEN PELVIS W CONTRAST  Result Date: 11/22/2019 CLINICAL DATA:  Abdominal pain and nausea EXAM: CT ABDOMEN AND PELVIS WITH CONTRAST TECHNIQUE: Multidetector CT imaging of the abdomen and pelvis was performed using the standard protocol following bolus administration of intravenous contrast. CONTRAST:  139mL OMNIPAQUE IOHEXOL 300 MG/ML  SOLN COMPARISON:  CT 01/26/2019 FINDINGS: Lower chest: Mild centrilobular emphysematous changes in the otherwise clear lung bases. No consolidation or pleural effusion. Cardiac size at the upper limits of normal. Persistent marked thickening of the distal thoracic esophagus and GE junction. Suspect some varicosities as well with serpiginous hyperdensity. Hepatobiliary: Diffuse hepatic hypoattenuation compatible with hepatic steatosis. Gallbladder decompressed. No pericholecystic fluid or inflammation. No visible calcified gallstones. Moderate intra and extrahepatic biliary ductal dilatation with distension of the common bile duct to 10 mm in maximal diameter although this is similar to the comparison study with smooth distal tapering at the level of the head of the pancreas. Similar appearance as remote is 2017. No visible calcified intraductal gallstones. Pancreas: Moderate pancreatic atrophy. No inflammation, ductal dilatation or discernible  lesions. Spleen: Normal in size without focal abnormality. Adrenals/Urinary Tract: Normal adrenal glands. Kidneys enhance and excrete symmetrically and uniformly. No concerning renal lesions. No visible urolithiasis or hydronephrosis. Urinary bladder is unremarkable. Stomach/Bowel: Distal esophageal thickening, as above. Nonspecific layering hyperdense material within the dependent portion of the stomach could reflect ingested material such as bismuth containing products. However more intermediate hyperdense material in a pseudo vessel configuration could reflect active contrast extravasation in the stomach arising from a small intramural enhancing focus. With numerous adjacent gastrosplenic collaterals. Distal stomach is more unremarkable in appearance. Duodenum takes a normal course across the midline abdomen. No small bowel thickening or dilatation. A normal appendix is visualized. No colonic dilatation or wall thickening. Vascular/Lymphatic: Atherosclerotic calcifications throughout the abdominal aorta and branch vessels. No aneurysm or ectasia. Paraesophageal and gastric collaterals, as above with vascular findings in the stomach detailed above as well. Major venous structures are grossly patent.No enlarged abdominopelvic lymph nodes. Reproductive: Anteverted uterus with parametrial vascular calcification, typically seen in the setting of diabetes. No concerning adnexal abnormalities. Other:  Mild body wall edema. Anterior abdominal wall laxity. Postsurgical changes from prior vertical midline incision. Musculoskeletal: The osseous structures appear diffusely demineralized which may limit detection of small or nondisplaced fractures. No acute osseous abnormality or suspicious osseous lesion. Multilevel degenerative changes are present in the imaged portions of the spine. Additional degenerative changes in the hips and pelvis. IMPRESSION: 1. Persistent marked thickening of the distal thoracic esophagus and GE  junction, with some adjacent varicosities and serpiginous hyperdensity. Could reflect esophagitis though should be further evaluated with endoscopy. 2. Nonspecific serpiginous hyperdensity in the stomach, arising from an intramural hyperdense focus, could raise suspicion for possible bleeding gastric varix or Dieulafoy lesion. Of note patient has previously undergone bleeding gastric ulcer consider NG placement and assessment of the gastric contents. If hemorrhagic, urgent endoscopy is warranted. 3. Hepatic steatosis. 4. Moderate intra and extrahepatic biliary ductal dilatation with smooth distal tapering at the level of the head of the pancreas. Similar appearance as remote is 2017. Correlate for finding 5. Aortic Atherosclerosis (ICD10-I70.0). Electronically Signed   By: Lovena Le M.D.   On: 11/22/2019 01:37    Scheduled Meds: . folic acid  1 mg Oral Daily  . LORazepam  0-4 mg Intravenous Q6H   Followed by  . [START ON 11/24/2019] LORazepam  0-4 mg Intravenous Q12H  . multivitamin with minerals  1 tablet Oral Daily  . pantoprazole (PROTONIX) IV  40 mg Intravenous Q24H  . thiamine  100 mg Oral Daily   Or  . thiamine  100 mg Intravenous Daily   Continuous Infusions:  Assessment/Plan:  1. Abdominal pain and vomiting. Recent diagnosis of esophageal stricture. Case get assessed with gastroenterology and endoscopy will be done tomorrow morning. Put on clear liquid diet today. With the patient stating that the discomfort went away after the NG tube wondering if there was food in the esophagus that just Pushed down through to the stomach with the NG tube. IV Protonix. As per GI can get rid of octreotide drip. 2. Hypokalemia and hypomagnesemia. Magnesium was replaced. Potassium low on repeat we will give K-Lor. 3. Elevated troponin likely demand ischemia with abdominal pain. 4. Pancytopenia likely secondary to alcohol abuse 5. Essential hypertension. Blood pressure very variable during the hospital  course    Code Status:     Code Status Orders  (From admission, onward)         Start     Ordered   11/22/19 0357  Full code  Continuous        11/22/19 0359        Code Status History    Date Active Date Inactive Code Status Order ID Comments User Context   03/05/2019 0513 03/07/2019 2137 Full Code [id number]  Harrie Foreman, MD Inpatient   11/16/2018 2358 11/18/2018 1741 Full Code [id number]  Demetrios Loll, MD ED   Advance Care Planning Activity     Family Communication: Patient deferred since her family was just here Disposition Plan: Status is: Observation  Dispo: The patient is from: Home              Anticipated d/c is to: Home              Anticipated d/c date is: Potentially 11/23/2019 depending on endoscopy results              Patient currently being treated for abdominal pain and vomiting with history of esophageal stricture. Will have endoscopy tomorrow with dilation. Depending on results will depend on how  long she stays.  Consultants:  Gastroenterology  Time spent: 34 minutes, case discussed with ER nursing staff and gastroenterology  [place]  [facility]

## 2019-11-22 NOTE — H&P (Signed)
History and Physical    Donna Aguirre GYJ:[id number] DOB: 1955/08/01 DOA: 11/22/2019  PCP: Patient, No Pcp Per   Patient coming from: Home  I have personally briefly reviewed patient's old medical records in [place]  Chief Complaint: Abdominal pain and vomiting  HPI: Donna Aguirre is a 64 y.o. female with medical history significant for alcohol abuse, hypertension, history of surgery for perforated gastric ulcer and esophageal stenosis on endoscopy August 2020 who presents to the emergency room with a history of ongoing upper abdominal pain and nausea for the past several months which has acutely worsened over the past 2 days.  States she started having trouble swallowing her food, feeling like she was choking on it then started having abdominal pain, spitting up then vomiting.  Vomitus was nonbloody nonbilious emesis and without coffee grounds.  Denies black or bloody stools.  She denies fever or chills.  Did not drank alcohol in the past 2 days because of the symptoms.  She denies diarrhea and dysuria.  Denies cough chest pain or shortness of breath ED Course: On arrival, blood pressure was 182/91, pulse 91, vitals otherwise normal.  She had several abnormalities on blood work including mild pancytopenia, hypomagnesemia and hypokalemia.  She also had a troponin of 14>>30.  LFTs unremarkable.  Urinalysis unremarkable.  EKG showed normal sinus rhythm with no acute ST-T wave changes.  CT abdomen and pelvis, unofficial report showing esophageal stenosis and finding that was concerning for possible leak of contrast into the stomach with recommendation for placement of NG tube.  NG tube was placed and the contents aspirated with negative Gastroccult.  Patient received IV and oral supplementation of potassium and magnesium.  Hospitalist consulted for admission.  Review of Systems: As per HPI otherwise all other systems on review of systems negative.    Past Medical History:  Diagnosis Date    . Arthritis   . Diabetes mellitus without complication ([place])   . Gout   . Hypertension     Past Surgical History:  Procedure Laterality Date  . perforated gastric ulcer       reports that she has been smoking cigarettes. She uses smokeless tobacco. She reports current alcohol use. She reports that she does not use drugs.  No Known Allergies  Family history reviewed and not pertinent   Prior to Admission medications   Medication Sig Start Date End Date Taking? Authorizing Provider  folic acid (FOLVITE) 1 MG tablet Take 1 tablet (1 mg total) by mouth daily. 03/08/19   Vaughan Basta, MD  lisinopril (ZESTRIL) 10 MG tablet Take 1 tablet (10 mg total) by mouth daily. 03/08/19   Vaughan Basta, MD  pantoprazole (PROTONIX) 40 MG tablet Take 1 tablet (40 mg total) by mouth 2 (two) times daily before a meal. 03/07/19   Vaughan Basta, MD  thiamine 100 MG tablet Take 1 tablet (100 mg total) by mouth daily. 03/08/19   Vaughan Basta, MD    Physical Exam: Vitals:   11/21/19 2120 11/21/19 2322  BP: (!) 182/91 (!) 176/93  Pulse: 91 78  Resp: 18 18  Temp: 98.4 F (36.9 C) 98.2 F (36.8 C)  TempSrc: Oral Oral  SpO2: 100% 100%  Weight: 66.2 kg   Height: 5\' 4"  (1.626 m)      Vitals:   11/21/19 2120 11/21/19 2322  BP: (!) 182/91 (!) 176/93  Pulse: 91 78  Resp: 18 18  Temp: 98.4 F (36.9 C) 98.2 F (36.8 C)  TempSrc: Oral  Oral  SpO2: 100% 100%  Weight: 66.2 kg   Height: 5\' 4"  (1.626 m)       Constitutional:  Somewhat ill looking, oriented x3 .  Appears to be in pain discomfort and in discomfort from NG tube HEENT:      Head: Normocephalic and atraumatic.         Eyes: PERLA, EOMI, Conjunctivae are normal. Sclera is non-icteric.       Mouth/Throat: Mucous membranes are moist.  NG tube      Neck: Supple with no signs of meningismus. Cardiovascular: Regular rate and rhythm. No murmurs, gallops, or rubs. 2+ symmetrical distal pulses are present .  No JVD. No LE edema Respiratory: Respiratory effort normal .Lungs sounds clear bilaterally. No wheezes, crackles, or rhonchi.  Gastrointestinal: Long midline abdominal surgical scar. Soft, epigastric tenderness, and non distended with positive bowel sounds. No rebound or guarding. Genitourinary: No CVA tenderness. Musculoskeletal: Nontender with normal range of motion in all extremities. No cyanosis, or erythema of extremities. Neurologic: Normal speech and language. Face is symmetric. Moving all extremities. No gross focal neurologic deficits . Skin: Skin is warm, dry.  No rash or ulcers Psychiatric: Mood and affect are normal Speech and behavior are normal   Labs on Admission: I have personally reviewed following labs and imaging studies  CBC: Recent Labs  Lab 11/21/19 2122  WBC 2.7*  NEUTROABS 1.8  HGB 11.0*  HCT 31.1*  MCV 80.4  PLT 341*   Basic Metabolic Panel: Recent Labs  Lab 11/21/19 2122  NA 135  K 2.7*  CL 98  CO2 26  GLUCOSE 116*  BUN <5*  CREATININE 0.49  CALCIUM 8.4*  MG 1.5*   GFR: Estimated Creatinine Clearance: 66.5 mL/min (by C-G formula based on SCr of 0.49 mg/dL). Liver Function Tests: Recent Labs  Lab 11/21/19 2332  AST 27  ALT 14  ALKPHOS 72  BILITOT 1.1  PROT 7.8  ALBUMIN 3.0*   Recent Labs  Lab 11/21/19 2122  LIPASE 17   No results for input(s): AMMONIA in the last 168 hours. Coagulation Profile: Recent Labs  Lab 11/21/19 2122  INR 1.2   Cardiac Enzymes: No results for input(s): CKTOTAL, CKMB, CKMBINDEX, TROPONINI in the last 168 hours. BNP (last 3 results) No results for input(s): PROBNP in the last 8760 hours. HbA1C: No results for input(s): HGBA1C in the last 72 hours. CBG: No results for input(s): GLUCAP in the last 168 hours. Lipid Profile: No results for input(s): CHOL, HDL, LDLCALC, TRIG, CHOLHDL, LDLDIRECT in the last 72 hours. Thyroid Function Tests: No results for input(s): TSH, T4TOTAL, FREET4, T3FREE,  THYROIDAB in the last 72 hours. Anemia Panel: No results for input(s): VITAMINB12, FOLATE, FERRITIN, TIBC, IRON, RETICCTPCT in the last 72 hours. Urine analysis:    Component Value Date/Time   COLORURINE YELLOW (A) 11/21/2019 2309   APPEARANCEUR HAZY (A) 11/21/2019 2309   LABSPEC 1.009 11/21/2019 2309   PHURINE 7.0 11/21/2019 2309   GLUCOSEU NEGATIVE 11/21/2019 2309   HGBUR NEGATIVE 11/21/2019 2309   BILIRUBINUR NEGATIVE 11/21/2019 2309   KETONESUR NEGATIVE 11/21/2019 2309   PROTEINUR NEGATIVE 11/21/2019 2309   NITRITE NEGATIVE 11/21/2019 2309   LEUKOCYTESUR SMALL (A) 11/21/2019 2309    Radiological Exams on Admission: No results found.  EKG: Independently reviewed. Interpretation : Normal sinus rhythm  Assessment/Plan  64 year old female with a history of alcohol abuse and hypertension and history of peptic ulcer disease and esophageal stricture presenting with abdominal pain and vomiting, work-up with electrolyte abnormalities,  elevated troponin,.  Preliminary CT abdomen suspicious for leak of contrast.    Abdominal pain and vomiting, in the setting of history ofHistory of esophageal stricture on EGD 12/2018 [facility] and history of surgery for perforated gastric ulcer -Differentials include acute gastritis, PUD and esophageal stricture.  No evidence of SBO on preliminary CT report -Follow-up official CT report -Continue NG tube -IV hydration -IV Protonix on hold due to prolonged QTc pending correction -Consider GI consult in the a.m.    Elevated troponin -Troponin with upward trend 14>>30. -No complaints of chest pain and EKG shows no acute ST-T wave changes -Suspect demand ischemia related to acute condition above -Can continue to trend to peak      Hypokalemia -Potassium 2.4, likely secondary to vomiting -Continue IV supplementation started in the emergency room and continue to monitor    Hypomagnesemia -Likely related to alcohol use, vomiting -Magnesium was 1.5. -IV  magnesium supplementation was given in the ER    Alcohol use disorder, moderate, dependence (HCC) -Alcohol level less than 10.  Patient states she did not take a drink in 2 days -CIWA withdrawal protocol -Banana bag    Pancytopenia (HCC) -Mild decrease in hemoglobin to 11, WBC to 2.7 and platelets to 145 -GI bleed not suspected given negative Gastroccult in the ER but will get stool for occult blood -Possibly related to alcohol use -Continue to monitor    HTN (hypertension) -Labetalol IV as needed as patient n.p.o.    DVT prophylaxis: SCDs Code Status: full code  Family Communication:  none  Disposition Plan: Back to previous home environment Consults called: GI  Status:obs    Athena Masse MD [facility]     11/22/2019, 3:59 AM

## 2019-11-23 ENCOUNTER — Observation Stay: Payer: Medicaid Other | Admitting: Anesthesiology

## 2019-11-23 ENCOUNTER — Encounter: Payer: Self-pay | Admitting: Internal Medicine

## 2019-11-23 ENCOUNTER — Encounter
Admission: EM | Disposition: A | Discharge status: Home / Self Care | Payer: Self-pay | Source: Home / Self Care | Attending: Internal Medicine

## 2019-11-23 DIAGNOSIS — K319 Disease of stomach and duodenum, unspecified: Secondary | ICD-10-CM | POA: Diagnosis present

## 2019-11-23 DIAGNOSIS — Z20822 Contact with and (suspected) exposure to covid-19: Secondary | ICD-10-CM | POA: Diagnosis present

## 2019-11-23 DIAGNOSIS — F1020 Alcohol dependence, uncomplicated: Secondary | ICD-10-CM | POA: Diagnosis present

## 2019-11-23 DIAGNOSIS — M1990 Unspecified osteoarthritis, unspecified site: Secondary | ICD-10-CM | POA: Diagnosis present

## 2019-11-23 DIAGNOSIS — R1310 Dysphagia, unspecified: Secondary | ICD-10-CM

## 2019-11-23 DIAGNOSIS — I10 Essential (primary) hypertension: Secondary | ICD-10-CM | POA: Diagnosis present

## 2019-11-23 DIAGNOSIS — K2960 Other gastritis without bleeding: Secondary | ICD-10-CM | POA: Diagnosis present

## 2019-11-23 DIAGNOSIS — K209 Esophagitis, unspecified without bleeding: Secondary | ICD-10-CM | POA: Diagnosis not present

## 2019-11-23 DIAGNOSIS — B9620 Unspecified Escherichia coli [E. coli] as the cause of diseases classified elsewhere: Secondary | ICD-10-CM | POA: Diagnosis present

## 2019-11-23 DIAGNOSIS — R000 Tachycardia, unspecified: Secondary | ICD-10-CM | POA: Diagnosis present

## 2019-11-23 DIAGNOSIS — K5900 Constipation, unspecified: Secondary | ICD-10-CM | POA: Diagnosis present

## 2019-11-23 DIAGNOSIS — N390 Urinary tract infection, site not specified: Secondary | ICD-10-CM | POA: Diagnosis present

## 2019-11-23 DIAGNOSIS — D61818 Other pancytopenia: Secondary | ICD-10-CM | POA: Diagnosis present

## 2019-11-23 DIAGNOSIS — Z8711 Personal history of peptic ulcer disease: Secondary | ICD-10-CM | POA: Diagnosis not present

## 2019-11-23 DIAGNOSIS — R1314 Dysphagia, pharyngoesophageal phase: Secondary | ICD-10-CM | POA: Diagnosis present

## 2019-11-23 DIAGNOSIS — I248 Other forms of acute ischemic heart disease: Secondary | ICD-10-CM | POA: Diagnosis present

## 2019-11-23 DIAGNOSIS — E876 Hypokalemia: Secondary | ICD-10-CM | POA: Diagnosis present

## 2019-11-23 DIAGNOSIS — E119 Type 2 diabetes mellitus without complications: Secondary | ICD-10-CM | POA: Diagnosis present

## 2019-11-23 DIAGNOSIS — K222 Esophageal obstruction: Principal | ICD-10-CM

## 2019-11-23 DIAGNOSIS — K2210 Ulcer of esophagus without bleeding: Secondary | ICD-10-CM | POA: Diagnosis present

## 2019-11-23 DIAGNOSIS — Z79899 Other long term (current) drug therapy: Secondary | ICD-10-CM | POA: Diagnosis not present

## 2019-11-23 DIAGNOSIS — F17210 Nicotine dependence, cigarettes, uncomplicated: Secondary | ICD-10-CM | POA: Diagnosis present

## 2019-11-23 DIAGNOSIS — D649 Anemia, unspecified: Secondary | ICD-10-CM | POA: Diagnosis not present

## 2019-11-23 HISTORY — PX: ESOPHAGOGASTRODUODENOSCOPY (EGD) WITH PROPOFOL: SHX5813

## 2019-11-23 SURGERY — ESOPHAGOGASTRODUODENOSCOPY (EGD) WITH PROPOFOL
Anesthesia: General

## 2019-11-23 MED ORDER — ESMOLOL HCL 100 MG/10ML IV SOLN
INTRAVENOUS | Status: DC | PRN
  Administered 2019-11-23: 20 mg via INTRAVENOUS

## 2019-11-23 MED ORDER — FENTANYL CITRATE (PF) 100 MCG/2ML IJ SOLN
INTRAMUSCULAR | Status: AC
  Filled 2019-11-23: qty 2

## 2019-11-23 MED ORDER — METOPROLOL TARTRATE 25 MG PO TABS
25.0000 mg | ORAL_TABLET | Freq: Two times a day (BID) | ORAL | Status: DC
  Administered 2019-11-23 – 2019-11-24 (×3): 25 mg via ORAL
  Filled 2019-11-23 (×3): qty 1

## 2019-11-23 MED ORDER — LIDOCAINE HCL (CARDIAC) PF 100 MG/5ML IV SOSY
PREFILLED_SYRINGE | INTRAVENOUS | Status: DC | PRN
  Administered 2019-11-23: 60 mg via INTRAVENOUS

## 2019-11-23 MED ORDER — ESMOLOL HCL 100 MG/10ML IV SOLN
INTRAVENOUS | Status: AC
  Filled 2019-11-23: qty 10

## 2019-11-23 MED ORDER — ONDANSETRON HCL 4 MG/2ML IJ SOLN
4.0000 mg | Freq: Three times a day (TID) | INTRAMUSCULAR | Status: DC | PRN
  Administered 2019-11-23: 4 mg via INTRAVENOUS
  Filled 2019-11-23: qty 2

## 2019-11-23 MED ORDER — CALCIUM CARBONATE ANTACID 500 MG PO CHEW
1.0000 | CHEWABLE_TABLET | Freq: Three times a day (TID) | ORAL | Status: DC | PRN
  Administered 2019-11-23: 19:00:00 200 mg via ORAL
  Filled 2019-11-23: qty 1

## 2019-11-23 MED ORDER — PROPOFOL 500 MG/50ML IV EMUL
INTRAVENOUS | Status: AC
  Filled 2019-11-23: qty 50

## 2019-11-23 MED ORDER — LIDOCAINE HCL (PF) 2 % IJ SOLN
INTRAMUSCULAR | Status: AC
  Filled 2019-11-23: qty 5

## 2019-11-23 MED ORDER — AMLODIPINE BESYLATE 5 MG PO TABS: 2.5000 mg | ORAL_TABLET | Freq: Every day | ORAL | Status: DC

## 2019-11-23 MED ORDER — METOPROLOL TARTRATE 25 MG PO TABS
12.5000 mg | ORAL_TABLET | Freq: Once | ORAL | Status: AC
  Administered 2019-11-23: 12.5 mg via ORAL
  Filled 2019-11-23: qty 1

## 2019-11-23 MED ORDER — SODIUM CHLORIDE 0.9 % IV SOLN
80.0000 mg | Freq: Once | INTRAVENOUS | Status: AC
  Administered 2019-11-23: 13:00:00 80 mg via INTRAVENOUS
  Filled 2019-11-23 (×2): qty 80

## 2019-11-23 MED ORDER — PROPOFOL 10 MG/ML IV BOLUS
INTRAVENOUS | Status: DC | PRN
  Administered 2019-11-23: 40 mg via INTRAVENOUS

## 2019-11-23 MED ORDER — FENTANYL CITRATE (PF) 100 MCG/2ML IJ SOLN
INTRAMUSCULAR | Status: DC | PRN
  Administered 2019-11-23 (×2): 25 ug via INTRAVENOUS

## 2019-11-23 MED ORDER — PANTOPRAZOLE SODIUM 40 MG IV SOLR
40.0000 mg | Freq: Two times a day (BID) | INTRAVENOUS | Status: DC
  Filled 2019-11-23: qty 40

## 2019-11-23 MED ORDER — SUCRALFATE 1 GM/10ML PO SUSP
1.0000 g | Freq: Three times a day (TID) | ORAL | Status: DC
  Administered 2019-11-23 – 2019-11-25 (×8): 1 g via ORAL
  Filled 2019-11-23 (×8): qty 10

## 2019-11-23 MED ORDER — METOPROLOL TARTRATE 25 MG PO TABS
12.5000 mg | ORAL_TABLET | Freq: Two times a day (BID) | ORAL | Status: DC
  Administered 2019-11-23: 12.5 mg via ORAL
  Filled 2019-11-23 (×2): qty 1

## 2019-11-23 MED ORDER — PROPOFOL 500 MG/50ML IV EMUL
INTRAVENOUS | Status: DC | PRN
  Administered 2019-11-23: 75 ug/kg/min via INTRAVENOUS

## 2019-11-23 MED ORDER — SODIUM CHLORIDE 0.9 % IV SOLN
8.0000 mg/h | INTRAVENOUS | Status: DC
  Administered 2019-11-23 – 2019-11-24 (×4): 8 mg/h via INTRAVENOUS
  Filled 2019-11-23 (×4): qty 80

## 2019-11-23 NOTE — Transfer of Care (Signed)
Immediate Anesthesia Transfer of Care Note  Patient: Donna Aguirre  Procedure(s) Performed: ESOPHAGOGASTRODUODENOSCOPY (EGD) WITH PROPOFOL (N/A )  Patient Location: PACU  Anesthesia Type:General  Level of Consciousness: awake  Airway & Oxygen Therapy: Patient Spontanous Breathing and Patient connected to nasal cannula oxygen  Post-op Assessment: Report given to RN  Post vital signs: Reviewed  Last Vitals:  Vitals Value Taken Time  BP    Temp    Pulse 79 11/23/19 1046  Resp 13 11/23/19 1046  SpO2 100 % 11/23/19 1046  Vitals shown include unvalidated device data.  Last Pain:  Vitals:   11/23/19 0835  TempSrc: Oral  PainSc:          Complications: No complications documented.

## 2019-11-23 NOTE — Op Note (Signed)
[facility] Patient Name: Donna Aguirre Procedure Date: 11/23/2019 10:18 AM MRN: [id number] Account #: [id number] Date of Birth: 1956-02-14 Admit Type: Inpatient Age: 64 Room: [facility] ENDO ROOM 4 Gender: Female Note Status: Finalized Procedure:             Upper GI endoscopy Indications:           Esophageal dysphagia Providers:             [name] MD, MD Referring MD:          No Local Md, MD (Referring MD) Medicines:             Monitored Anesthesia Care Complications:         No immediate complications. Estimated blood loss:                         Minimal. Procedure:             Pre-Anesthesia Assessment:                        - Prior to the procedure, a History and Physical was                         performed, and patient medications and allergies were                         reviewed. The patient is competent. The risks and                         benefits of the procedure and the sedation options and                         risks were discussed with the patient. All questions                         were answered and informed consent was obtained.                         Patient identification and proposed procedure were                         verified by the physician, the nurse, the                         anesthesiologist, the anesthetist and the technician                         in the pre-procedure area in the procedure room in the                         endoscopy suite. Mental Status Examination: alert and                         oriented. Airway Examination: normal oropharyngeal                         airway and neck mobility. Respiratory Examination:  clear to auscultation. CV Examination: normal.                         Prophylactic Antibiotics: The patient does not require                         prophylactic antibiotics. Prior Anticoagulants: The                         patient has taken no  previous anticoagulant or                         antiplatelet agents. ASA Grade Assessment: III - A                         patient with severe systemic disease. After reviewing                         the risks and benefits, the patient was deemed in                         satisfactory condition to undergo the procedure. The                         anesthesia plan was to use monitored anesthesia care                         (MAC). Immediately prior to administration of                         medications, the patient was re-assessed for adequacy                         to receive sedatives. The heart rate, respiratory                         rate, oxygen saturations, blood pressure, adequacy of                         pulmonary ventilation, and response to care were                         monitored throughout the procedure. The physical                         status of the patient was re-assessed after the                         procedure.                        After obtaining informed consent, the endoscope was                         passed under direct vision. Throughout the procedure,                         the patient's blood pressure, pulse, and oxygen  saturations were monitored continuously. The Endoscope                         was introduced through the mouth, and advanced to the                         second part of duodenum. The upper GI endoscopy was                         accomplished without difficulty. The patient tolerated                         the procedure well. Findings:      Two benign-appearing, intrinsic severe (stenosis; an endoscope cannot       pass) stenoses were found at 25 and 35 cm from the incisors       respectively. Scope was traversed with mild resistance across the       proximal stricture. The narrowest stenosis at 35cm measured less than       one cm (in length). The distal stenoses were traversed after dilation. A        TTS dilator was passed through the scope. Dilation with an 01-05-09 mm       balloon dilator was performed to 9 mm. The dilation site was examined       following endoscope reinsertion and showed moderate mucosal disruption       and moderate improvement in luminal narrowing. Estimated blood loss was       minimal.      LA Grade D (one or more mucosal breaks involving at least 75% of       esophageal circumference) esophagitis with no bleeding was found in the       lower and middle third of the esophagus with superficial ulcers.       Multiple divertula in mid esophagus      Multiple dispersed small erosions with no bleeding and no stigmata of       recent bleeding were found in the entire examined stomach.      The duodenal bulb and second portion of the duodenum were normal. Impression:            - Benign-appearing esophageal stenoses. Dilated.                        - LA Grade D erosive esophagitis with no bleeding.                        - Erosive gastropathy with no bleeding and no stigmata                         of recent bleeding.                        - Normal duodenal bulb and second portion of the                         duodenum.                        - No specimens collected. Recommendation:        - Return patient to hospital ward for ongoing  care.                        - Clear liquid diet today.                        - Continue present medications.                        - Give Protonix (pantoprazole): initiate therapy with                         80 mg IV bolus, then 8 mg/hr IV by continuous infusion                         for 3 days.                        - Indefinite omeprazole 31m BID                        - Complete abstinance frm ETOH, NSAIDs                        - Repeat upper endoscopy in 4 weeks for retreatment.                        - Return to GI clinic in 4 weeks. Procedure Code(s):     --- Professional ---                        [id number]  Esophagogastroduodenoscopy, flexible,                         transoral; with transendoscopic balloon dilation of                         esophagus (less than 30 mm diameter) Diagnosis Code(s):     --- Professional ---                        K20.80, Other esophagitis without bleeding                        K22.2, Esophageal obstruction                        K31.89, Other diseases of stomach and duodenum                        R13.14, Dysphagia, pharyngoesophageal phase CPT copyright 2019 American Medical Association. All rights reserved. The codes documented in this report are preliminary and upon coder review may  be revised to meet current compliance requirements. Dr. RUlyess MortRLin LandsmanMD, MD 11/23/2019 10:47:51 AM This report has been signed electronically. Number of Addenda: 0 Note Initiated On: 11/23/2019 10:18 AM Estimated Blood Loss:  Estimated blood loss was minimal.      [facility]

## 2019-11-23 NOTE — Anesthesia Preprocedure Evaluation (Addendum)
Anesthesia Evaluation  Patient identified by MRN, date of birth, ID band Patient awake    Reviewed: Allergy & Precautions, NPO status , Patient's Chart, lab work & pertinent test results  History of Anesthesia Complications Negative for: history of anesthetic complications  Airway Mallampati: II  TM Distance: >3 FB Neck ROM: Full    Dental  (+) Edentulous Lower, Edentulous Upper   Pulmonary neg sleep apnea, neg COPD, Current Smoker and Patient abstained from smoking.,    Pulmonary exam normal breath sounds clear to auscultation       Cardiovascular Exercise Tolerance: Good METShypertension, (-) CAD and (-) Past MI (-) dysrhythmias  Rhythm:Regular Rate:Normal - Systolic murmurs Echo 7/86/7672:  Borderline left ventricular systolic function, ejection fraction 50%  Aortic sclerosis  Dilated right ventricle - mild  Normal right ventricular systolic function  Dilated right atrium - mild  Tricuspid regurgitation - mild  Mildly elevated right atrial pressure    Neuro/Psych negative neurological ROS  negative psych ROS   GI/Hepatic PUD, neg GERD  ,(+)     substance abuse  alcohol use, S/p abdominal surgeries for perforate gastric ulcer, esophageal stenosis   Endo/Other  diabetes  Renal/GU negative Renal ROS     Musculoskeletal   Abdominal   Peds  Hematology   Anesthesia Other Findings Past Medical History: No date: Arthritis No date: Diabetes mellitus without complication (HCC) No date: Gout No date: Hypertension  Reproductive/Obstetrics                            Anesthesia Physical Anesthesia Plan  ASA: III  Anesthesia Plan: General   Post-op Pain Management:    Induction: Intravenous  PONV Risk Score and Plan: 3 and Ondansetron, Propofol infusion and TIVA  Airway Management Planned: Nasal Cannula  Additional Equipment: None  Intra-op Plan:   Post-operative Plan:    Informed Consent: I have reviewed the patients History and Physical, chart, labs and discussed the procedure including the risks, benefits and alternatives for the proposed anesthesia with the patient or authorized representative who has indicated his/her understanding and acceptance.     Dental advisory given  Plan Discussed with: CRNA and Surgeon  Anesthesia Plan Comments: (Discussed risks of anesthesia with patient, including possibility of difficulty with spontaneous ventilation under anesthesia necessitating airway intervention, PONV, and rare risks such as cardiac or respiratory or neurological events. Patient understands. Denies nausea vomiting)       Anesthesia Quick Evaluation

## 2019-11-23 NOTE — Anesthesia Postprocedure Evaluation (Signed)
Anesthesia Post Note  Patient: Donna Aguirre  Procedure(s) Performed: ESOPHAGOGASTRODUODENOSCOPY (EGD) WITH PROPOFOL (N/A )  Patient location during evaluation: PACU Anesthesia Type: General Level of consciousness: awake and alert Pain management: pain level controlled Vital Signs Assessment: post-procedure vital signs reviewed and stable Respiratory status: spontaneous breathing, nonlabored ventilation, respiratory function stable and patient connected to nasal cannula oxygen Cardiovascular status: blood pressure returned to baseline and stable Postop Assessment: no apparent nausea or vomiting Anesthetic complications: no   No complications documented.   Last Vitals:  Vitals:   11/23/19 1047 11/23/19 1058  BP: (!) 96/56 (!) 106/59  Pulse: 79 93  Resp: 13 18  Temp: 36.6 C   SpO2: 100% 100%    Last Pain:  Vitals:   11/23/19 1058  TempSrc:   PainSc: 0-No pain                 Arita Miss

## 2019-11-23 NOTE — Progress Notes (Signed)
Patient ID: Donna Aguirre, female   DOB: Jan 20, 1956, 64 y.o.   MRN: [id number] [facility] PROGRESS NOTE  Donna Aguirre XTG:[id number] DOB: [birth year] DOA: 11/22/2019 PCP: Patient, No Pcp Per  HPI/Subjective: Patient came in with abdominal pain and vomiting.  This has resolved.  Patient felt well this morning and offers no complaints.  She states that she can has not been able to eat very well.  Objective: Vitals:   11/23/19 1116 11/23/19 1131  BP:  107/61  Pulse: 75 76  Resp: 20   Temp:  97.6 F (36.4 C)  SpO2: 100% 100%    Intake/Output Summary (Last 24 hours) at 11/23/2019 1231 Last data filed at 11/23/2019 1105 Gross per 24 hour  Intake 643.88 ml  Output 1 ml  Net 642.88 ml   Filed Weights   11/21/19 2120  Weight: 66.2 kg    ROS: Review of Systems  Respiratory: Negative for shortness of breath.   Cardiovascular: Negative for chest pain.  Gastrointestinal: Negative for abdominal pain, nausea and vomiting.   Exam: Physical Exam HENT:     Head: Normocephalic.     Nose: No mucosal edema.     Mouth/Throat:     Pharynx: No oropharyngeal exudate.  Eyes:     General: Lids are normal.     Pupils: Pupils are equal, round, and reactive to light.  Cardiovascular:     Rate and Rhythm: Normal rate and regular rhythm.     Heart sounds: Normal heart sounds, S1 normal and S2 normal. No murmur heard.   Pulmonary:     Breath sounds: No decreased breath sounds, wheezing, rhonchi or rales.  Abdominal:     Palpations: Abdomen is soft.     Tenderness: There is no abdominal tenderness.  Musculoskeletal:     Right lower leg: No swelling.     Left lower leg: No swelling.  Skin:    General: Skin is warm.     Findings: No rash.  Neurological:     Mental Status: She is alert.     Cranial Nerves: Cranial nerves are intact.       Data Reviewed: Basic Metabolic Panel: Recent Labs  Lab 11/21/19 2122 11/22/19 0558 11/22/19 0957  NA 135  --  135  K 2.7*  --  3.3*   CL 98  --  96*  CO2 26  --  28  GLUCOSE 116*  --  109*  BUN <5*  --  <5*  CREATININE 0.49  --  0.38*  CALCIUM 8.4*  --  8.5*  MG 1.5*  --  1.9  PHOS  --  3.2  --    Liver Function Tests: Recent Labs  Lab 11/21/19 2332  AST 27  ALT 14  ALKPHOS 72  BILITOT 1.1  PROT 7.8  ALBUMIN 3.0*   Recent Labs  Lab 11/21/19 2122  LIPASE 17   CBC: Recent Labs  Lab 11/21/19 2122 11/22/19 0558 11/22/19 0957  WBC 2.7* 3.2* 2.8*  NEUTROABS 1.8  --   --   HGB 11.0* 12.5 12.5  HCT 31.1* 34.8* 35.0*  MCV 80.4 79.6* 79.7*  PLT 145* 126* 151     Recent Results (from the past 240 hour(s))  Urine culture     Status: Abnormal (Preliminary result)   Collection Time: 11/21/19 11:09 PM   Specimen: Urine, Random  Result Value Ref Range Status   Specimen Description   Final    URINE, RANDOM Performed at [facility], [place]  [address], [place], [place] [address]    Special Requests   Final    NONE Performed at [facility], [address] [place], [place] [address]    Culture >=100,000 COLONIES/mL ESCHERICHIA COLI (A)  Final   Report Status PENDING  Incomplete  SARS Coronavirus 2 by RT PCR (hospital order, performed in [facility] hospital lab) Nasopharyngeal Nasopharyngeal Swab     Status: None   Collection Time: 11/22/19  4:00 AM   Specimen: Nasopharyngeal Swab  Result Value Ref Range Status   SARS Coronavirus 2 NEGATIVE NEGATIVE Final    Comment: (NOTE) SARS-CoV-2 target nucleic acids are NOT DETECTED.  The SARS-CoV-2 RNA is generally detectable in upper and lower respiratory specimens during the acute phase of infection. The lowest concentration of SARS-CoV-2 viral copies this assay can detect is 250 copies / mL. A negative result does not preclude SARS-CoV-2 infection and should not be used as the sole basis for treatment or other patient management decisions.  A negative result may occur with improper specimen collection / handling, submission of specimen  other than nasopharyngeal swab, presence of viral mutation(s) within the areas targeted by this assay, and inadequate number of viral copies (<250 copies / mL). A negative result must be combined with clinical observations, patient history, and epidemiological information.  Fact Sheet for Patients:   StrictlyIdeas.no  Fact Sheet for Healthcare Providers: BankingDealers.co.za  This test is not yet approved or  cleared by the [place] FDA and has been authorized for detection and/or diagnosis of SARS-CoV-2 by FDA under an Emergency Use Authorization (EUA).  This EUA will remain in effect (meaning this test can be used) for the duration of the COVID-19 declaration under Section 564(b)(1) of the Act, 21 U.S.C. section 360bbb-3(b)(1), unless the authorization is terminated or revoked sooner.  Performed at [facility], [address] [place], [place] [address]      Studies: DG Abdomen 1 View  Result Date: 11/22/2019 CLINICAL DATA:  NG tube placement EXAM: ABDOMEN - 1 VIEW COMPARISON:  None. FINDINGS: NG tube tip and side port project within the stomach. Lung bases are clear. IMPRESSION: NG tube tip and side port in the stomach. Electronically Signed   By: Ulyses Jarred M.D.   On: 11/22/2019 02:22   CT ABDOMEN PELVIS W CONTRAST  Result Date: 11/22/2019 CLINICAL DATA:  Abdominal pain and nausea EXAM: CT ABDOMEN AND PELVIS WITH CONTRAST TECHNIQUE: Multidetector CT imaging of the abdomen and pelvis was performed using the standard protocol following bolus administration of intravenous contrast. CONTRAST:  158mL OMNIPAQUE IOHEXOL 300 MG/ML  SOLN COMPARISON:  CT 01/26/2019 FINDINGS: Lower chest: Mild centrilobular emphysematous changes in the otherwise clear lung bases. No consolidation or pleural effusion. Cardiac size at the upper limits of normal. Persistent marked thickening of the distal thoracic esophagus and GE junction.  Suspect some varicosities as well with serpiginous hyperdensity. Hepatobiliary: Diffuse hepatic hypoattenuation compatible with hepatic steatosis. Gallbladder decompressed. No pericholecystic fluid or inflammation. No visible calcified gallstones. Moderate intra and extrahepatic biliary ductal dilatation with distension of the common bile duct to 10 mm in maximal diameter although this is similar to the comparison study with smooth distal tapering at the level of the head of the pancreas. Similar appearance as remote is 2017. No visible calcified intraductal gallstones. Pancreas: Moderate pancreatic atrophy. No inflammation, ductal dilatation or discernible lesions. Spleen: Normal in size without focal abnormality. Adrenals/Urinary Tract: Normal adrenal glands. Kidneys enhance and excrete symmetrically and uniformly. No concerning renal lesions.  No visible urolithiasis or hydronephrosis. Urinary bladder is unremarkable. Stomach/Bowel: Distal esophageal thickening, as above. Nonspecific layering hyperdense material within the dependent portion of the stomach could reflect ingested material such as bismuth containing products. However more intermediate hyperdense material in a pseudo vessel configuration could reflect active contrast extravasation in the stomach arising from a small intramural enhancing focus. With numerous adjacent gastrosplenic collaterals. Distal stomach is more unremarkable in appearance. Duodenum takes a normal course across the midline abdomen. No small bowel thickening or dilatation. A normal appendix is visualized. No colonic dilatation or wall thickening. Vascular/Lymphatic: Atherosclerotic calcifications throughout the abdominal aorta and branch vessels. No aneurysm or ectasia. Paraesophageal and gastric collaterals, as above with vascular findings in the stomach detailed above as well. Major venous structures are grossly patent.No enlarged abdominopelvic lymph nodes. Reproductive:  Anteverted uterus with parametrial vascular calcification, typically seen in the setting of diabetes. No concerning adnexal abnormalities. Other: Mild body wall edema. Anterior abdominal wall laxity. Postsurgical changes from prior vertical midline incision. Musculoskeletal: The osseous structures appear diffusely demineralized which may limit detection of small or nondisplaced fractures. No acute osseous abnormality or suspicious osseous lesion. Multilevel degenerative changes are present in the imaged portions of the spine. Additional degenerative changes in the hips and pelvis. IMPRESSION: 1. Persistent marked thickening of the distal thoracic esophagus and GE junction, with some adjacent varicosities and serpiginous hyperdensity. Could reflect esophagitis though should be further evaluated with endoscopy. 2. Nonspecific serpiginous hyperdensity in the stomach, arising from an intramural hyperdense focus, could raise suspicion for possible bleeding gastric varix or Dieulafoy lesion. Of note patient has previously undergone bleeding gastric ulcer consider NG placement and assessment of the gastric contents. If hemorrhagic, urgent endoscopy is warranted. 3. Hepatic steatosis. 4. Moderate intra and extrahepatic biliary ductal dilatation with smooth distal tapering at the level of the head of the pancreas. Similar appearance as remote is 2017. Correlate for finding 5. Aortic Atherosclerosis (ICD10-I70.0). Electronically Signed   By: Lovena Le M.D.   On: 11/22/2019 01:37    Scheduled Meds: . amLODipine  2.5 mg Oral Daily  . folic acid  1 mg Oral Daily  . LORazepam  0-4 mg Intravenous Q6H   Followed by  . [START ON 11/24/2019] LORazepam  0-4 mg Intravenous Q12H  . multivitamin with minerals  1 tablet Oral Daily  . [START ON 11/26/2019] pantoprazole  40 mg Intravenous Q12H  . sucralfate  1 g Oral TID WC & HS  . thiamine  100 mg Oral Daily   Or  . thiamine  100 mg Intravenous Daily   Continuous  Infusions: . pantoprozole (PROTONIX) infusion    . pantoprazole (PROTONIX) IVPB      Assessment/Plan:  1. Esophageal stricture with severe esophagitis.  GI recommends IV Protonix for 3 days.  Status post dilatation procedure by Dr. Marius Ditch today. Continue clear liquid diet.  Abdominal pain and vomiting has resolved. 2. Hypomagnesemia and hypokalemia.  Magnesium replaced yesterday potassium replaced IV and orally yesterday. 3. Elevated troponin demand ischemia with abdominal pain 4. Pancytopenia secondary to alcohol abuse 5. Essential hypertension blood pressure very variable during the hospital course.  I did give Norvasc yesterday and today since blood pressure little lower I will cut back the dose on the Norvasc and continue to monitor closely      Code Status:     Code Status Orders  (From admission, onward)         Start     Ordered   11/22/19 0357  Full code  Continuous        11/22/19 0359        Code Status History    Date Active Date Inactive Code Status Order ID Comments User Context   03/05/2019 0513 03/07/2019 2137 Full Code [id number]  Harrie Foreman, MD Inpatient   11/16/2018 2358 11/18/2018 1741 Full Code [id number]  Demetrios Loll, MD ED   Advance Care Planning Activity     Family Communication: Tried to call 2 daughters but no answer on any of the phones that I called. Disposition Plan: Status is: Inpatient  Dispo: The patient is from: Home              Anticipated d/c is to: Home              Anticipated d/c date is: In 3 to 4 days dependent on hospital course              Patient currently switched to IV Protonix drip for 3 days as per gastroenterology.  Consultants:  Gastroenterology  Time spent: 27 minutes  [place]

## 2019-11-24 DIAGNOSIS — N390 Urinary tract infection, site not specified: Secondary | ICD-10-CM

## 2019-11-24 DIAGNOSIS — R000 Tachycardia, unspecified: Secondary | ICD-10-CM

## 2019-11-24 DIAGNOSIS — D649 Anemia, unspecified: Secondary | ICD-10-CM

## 2019-11-24 DIAGNOSIS — B9620 Unspecified Escherichia coli [E. coli] as the cause of diseases classified elsewhere: Secondary | ICD-10-CM

## 2019-11-24 LAB — BASIC METABOLIC PANEL
Anion gap: 7 (ref 5–15)
BUN: <5 mg/dL — ABNORMAL LOW (ref 8–23)
CO2: 28 mmol/L (ref 22–32)
Calcium: 8.9 mg/dL (ref 8.9–10.3)
Chloride: 100 mmol/L (ref 98–111)
Creatinine, Ser: 0.47 mg/dL (ref 0.44–1.00)
GFR calc Af Amer: >60 mL/min (ref >60)
GFR calc non Af Amer: >60 mL/min (ref >60)
Glucose, Bld: 97 mg/dL (ref 70–99)
Potassium: 4.1 mmol/L (ref 3.5–5.1)
Sodium: 135 mmol/L (ref 135–145)

## 2019-11-24 LAB — URINE CULTURE: Culture: >=100000 — AB

## 2019-11-24 LAB — MAGNESIUM: Magnesium: 1.5 mg/dL — ABNORMAL LOW (ref 1.7–2.4)

## 2019-11-24 LAB — PHOSPHORUS: Phosphorus: 2.5 mg/dL (ref 2.5–4.6)

## 2019-11-24 MED ORDER — SODIUM CHLORIDE 0.9 % IV SOLN
INTRAVENOUS | Status: DC | PRN
  Administered 2019-11-24: 16:00:00 250 mL via INTRAVENOUS

## 2019-11-24 MED ORDER — SODIUM CHLORIDE 0.9 % IV SOLN
1.0000 g | INTRAVENOUS | Status: DC
  Administered 2019-11-24: 1 g via INTRAVENOUS
  Filled 2019-11-24 (×2): qty 10
  Filled 2019-11-24: qty 1

## 2019-11-24 MED ORDER — MAGNESIUM SULFATE 2 GM/50ML IV SOLN
2.0000 g | Freq: Once | INTRAVENOUS | Status: AC
  Administered 2019-11-24: 08:00:00 2 g via INTRAVENOUS
  Filled 2019-11-24: qty 50

## 2019-11-24 MED ORDER — OXYCODONE HCL 5 MG PO TABS
5.0000 mg | ORAL_TABLET | Freq: Four times a day (QID) | ORAL | Status: DC | PRN
  Administered 2019-11-24: 5 mg via ORAL
  Filled 2019-11-24: qty 1

## 2019-11-24 MED ORDER — POLYETHYLENE GLYCOL 3350 17 G PO PACK
17.0000 g | PACK | Freq: Every day | ORAL | Status: DC
  Administered 2019-11-24 – 2019-11-25 (×2): 17 g via ORAL
  Filled 2019-11-24 (×2): qty 1

## 2019-11-24 NOTE — Progress Notes (Signed)
Jonathon Bellows , MD [address] [place], [place], [place], [address] [address], [place], [place], [place], [address] Phone: [phone]  Fax: [phone]   STEPHAIE DARDIS is being followed for abnormal CT scan  Day 2 of follow up   Subjective: Doing well tolerated a full liquid diet well, no complaints    Objective: Vital signs in last 24 hours: Vitals:   11/23/19 1732 11/23/19 2021 11/24/19 0653 11/24/19 0841  BP: (!) 175/93 (!) 178/98 97/69 (!) 153/81  Pulse: 82 77 70 72  Resp: 16 16 18 14   Temp: 98.3 F (36.8 C) 98.2 F (36.8 C) 98.5 F (36.9 C) 98.2 F (36.8 C)  TempSrc: Oral Oral  Oral  SpO2: 100% 100% 100% 100%  Weight:      Height:       Weight change:   Intake/Output Summary (Last 24 hours) at 11/24/2019 4403 Last data filed at 11/23/2019 1500 Gross per 24 hour  Intake 215.13 ml  Output --  Net 215.13 ml     Exam: Heart:: Regular rate and rhythm, S1S2 present or without murmur or extra heart sounds Lungs: normal, clear to auscultation and clear to auscultation and percussion Abdomen: soft, nontender, normal bowel sounds   Lab Results: @LABTEST2 @ Micro Results: Recent Results (from the past 240 hour(s))  Urine culture     Status: Abnormal (Preliminary result)   Collection Time: 11/21/19 11:09 PM   Specimen: Urine, Random  Result Value Ref Range Status   Specimen Description   Final    URINE, RANDOM Performed at [facility], [address], [place], [place] [address]    Special Requests   Final    NONE Performed at [facility], [address] [place], [place] [address]    Culture >=100,000 COLONIES/mL ESCHERICHIA COLI (A)  Final   Report Status PENDING  Incomplete  SARS Coronavirus 2 by RT PCR (hospital order, performed in [place] hospital lab) Nasopharyngeal Nasopharyngeal Swab     Status: None   Collection Time: 11/22/19  4:00 AM   Specimen: Nasopharyngeal Swab  Result Value Ref Range Status   SARS  Coronavirus 2 NEGATIVE NEGATIVE Final    Comment: (NOTE) SARS-CoV-2 target nucleic acids are NOT DETECTED.  The SARS-CoV-2 RNA is generally detectable in upper and lower respiratory specimens during the acute phase of infection. The lowest concentration of SARS-CoV-2 viral copies this assay can detect is 250 copies / mL. A negative result does not preclude SARS-CoV-2 infection and should not be used as the sole basis for treatment or other patient management decisions.  A negative result may occur with improper specimen collection / handling, submission of specimen other than nasopharyngeal swab, presence of viral mutation(s) within the areas targeted by this assay, and inadequate number of viral copies (<250 copies / mL). A negative result must be combined with clinical observations, patient history, and epidemiological information.  Fact Sheet for Patients:   StrictlyIdeas.no  Fact Sheet for Healthcare Providers: BankingDealers.co.za  This test is not yet approved or  cleared by the [place] FDA and has been authorized for detection and/or diagnosis of SARS-CoV-2 by FDA under an Emergency Use Authorization (EUA).  This EUA will remain in effect (meaning this test can be used) for the duration of the COVID-19 declaration under Section 564(b)(1) of the Act, 21 U.S.C. section 360bbb-3(b)(1), unless the authorization is terminated or revoked sooner.  Performed at [facility], [address], [place], [place] [address]  Studies/Results: No results found. Medications: I have reviewed the patient's current medications. Scheduled Meds: . folic acid  1 mg Oral Daily  . LORazepam  0-4 mg Intravenous Q12H  . metoprolol tartrate  25 mg Oral BID  . multivitamin with minerals  1 tablet Oral Daily  . [START ON 11/26/2019] pantoprazole  40 mg Intravenous Q12H  . sucralfate  1 g Oral TID WC & HS  . thiamine  100 mg Oral Daily    Or  . thiamine  100 mg Intravenous Daily   Continuous Infusions: . pantoprozole (PROTONIX) infusion 8 mg/hr (11/24/19 0849)   PRN Meds:.calcium carbonate, LORazepam **OR** LORazepam, morphine injection, ondansetron (ZOFRAN) IV, oxyCODONE  CBC Latest Ref Rng & Units 11/22/2019 11/22/2019 11/21/2019  WBC 4.0 - 10.5 K/uL 2.8(L) 3.2(L) 2.7(L)  Hemoglobin 12.0 - 15.0 g/dL 12.5 12.5 11.0(L)  Hematocrit 36 - 46 % 35.0(L) 34.8(L) 31.1(L)  Platelets 150 - 400 K/uL 151 126(L) 145(L)    Assessment: Principal Problem:   Abdominal pain Active Problems:   Alcohol use disorder, moderate, dependence (HCC)   Elevated troponin   Pancytopenia (HCC)   HTN (hypertension)   History of esophageal stricture on EGD 12/2018 [facility]   PUD (peptic ulcer disease), with hx of perforated gastric ulcer   Vomiting   Hypokalemia   Hypomagnesemia   Alcohol abuse   Esophageal dysphagia   Esophageal stricture   Esophagitis TANAJA GANGER is a 64 y.o. 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 Ct scan shows significant thickening of the esophagus and GE junction and some abnormality in the stomach concerning for bleeding. Unlikely to be bleeding as the Hb is stable and no elevated in BUN/CR ratio. Underwent EGD yesterday and an esophageal stricture was dilated to 10 mm , LA grade D esophagitis , erosive gastritis . Hb stable   Plan  1. Suggest Prilosec 40 mg BID long term  2. Can start on soft diet  - avoid any meat or large chunky food till next dilation in 2 weeks with Dr Marius Ditch since she has very tight strictures.  3. Check iron studies, b12 as she has microcytosis 4. Stop all alcohol use      LOS: 1 day   Jonathon Bellows, MD 11/24/2019, 9:07 AM

## 2019-11-24 NOTE — Progress Notes (Signed)
Patient ID: Donna Aguirre, female   DOB: November 15, 1955, 64 y.o.   MRN: [id number] [facility] PROGRESS NOTE  KHADEEJAH CASTNER WUJ:[id number] DOB: [birth year] DOA: 11/22/2019 PCP: Patient, No Pcp Per  HPI/Subjective: Patient came in with abdominal pain and nausea.  Feeling well now.  Tolerating liquid diet.  Objective: Vitals:   11/24/19 1144 11/24/19 1257  BP: 105/79 107/73  Pulse:  (!) 57  Resp:    Temp: 98.4 F (36.9 C)   SpO2: 98% 97%    Intake/Output Summary (Last 24 hours) at 11/24/2019 1447 Last data filed at 11/23/2019 1500 Gross per 24 hour  Intake 115.13 ml  Output --  Net 115.13 ml   Filed Weights   11/21/19 2120  Weight: 66.2 kg    ROS: Review of Systems  Respiratory: Negative for shortness of breath.   Cardiovascular: Negative for chest pain.  Gastrointestinal: Negative for abdominal pain, nausea and vomiting.   Exam: Physical Exam HENT:     Nose: No mucosal edema.     Mouth/Throat:     Pharynx: No oropharyngeal exudate.  Eyes:     General: Lids are normal.     Pupils: Pupils are equal, round, and reactive to light.  Cardiovascular:     Rate and Rhythm: Normal rate and regular rhythm.     Heart sounds: Normal heart sounds, S1 normal and S2 normal.  Pulmonary:     Breath sounds: No decreased breath sounds, wheezing, rhonchi or rales.  Abdominal:     Palpations: Abdomen is soft.     Tenderness: There is no abdominal tenderness.  Musculoskeletal:     Right ankle: No swelling.     Left ankle: No swelling.  Skin:    General: Skin is warm.     Findings: No rash.  Neurological:     Mental Status: She is alert and oriented to person, place, and time.       Data Reviewed: Basic Metabolic Panel: Recent Labs  Lab 11/21/19 2122 11/22/19 0558 11/22/19 0957 11/24/19 0524  NA 135  --  135 135  K 2.7*  --  3.3* 4.1  CL 98  --  96* 100  CO2 26  --  28 28  GLUCOSE 116*  --  109* 97  BUN <5*  --  <5* <5*  CREATININE 0.49  --  0.38* 0.47  CALCIUM  8.4*  --  8.5* 8.9  MG 1.5*  --  1.9 1.5*  PHOS  --  3.2  --  2.5   Liver Function Tests: Recent Labs  Lab 11/21/19 2332  AST 27  ALT 14  ALKPHOS 72  BILITOT 1.1  PROT 7.8  ALBUMIN 3.0*   Recent Labs  Lab 11/21/19 2122  LIPASE 17   CBC: Recent Labs  Lab 11/21/19 2122 11/22/19 0558 11/22/19 0957  WBC 2.7* 3.2* 2.8*  NEUTROABS 1.8  --   --   HGB 11.0* 12.5 12.5  HCT 31.1* 34.8* 35.0*  MCV 80.4 79.6* 79.7*  PLT 145* 126* 151     Recent Results (from the past 240 hour(s))  Urine culture     Status: Abnormal   Collection Time: 11/21/19 11:09 PM   Specimen: Urine, Random  Result Value Ref Range Status   Specimen Description   Final    URINE, RANDOM Performed at [facility], [address], [place], [place] [address]    Special Requests   Final    NONE Performed at [facility], [address] [place], [place] [address]    Culture >=100,000 COLONIES/mL ESCHERICHIA COLI (A)  Final   Report Status 11/24/2019 FINAL  Final   Organism ID, Bacteria ESCHERICHIA COLI (A)  Final      Susceptibility   Escherichia coli - MIC*    AMPICILLIN >=32 RESISTANT Resistant     CEFAZOLIN <=4 SENSITIVE Sensitive     CEFTRIAXONE <=0.25 SENSITIVE Sensitive     CIPROFLOXACIN <=0.25 SENSITIVE Sensitive     GENTAMICIN >=16 RESISTANT Resistant     IMIPENEM <=0.25 SENSITIVE Sensitive     NITROFURANTOIN <=16 SENSITIVE Sensitive     TRIMETH/SULFA >=320 RESISTANT Resistant     AMPICILLIN/SULBACTAM >=32 RESISTANT Resistant     PIP/TAZO <=4 SENSITIVE Sensitive     * >=100,000 COLONIES/mL ESCHERICHIA COLI  SARS Coronavirus 2 by RT PCR (hospital order, performed in [place] hospital lab) Nasopharyngeal Nasopharyngeal Swab     Status: None   Collection Time: 11/22/19  4:00 AM   Specimen: Nasopharyngeal Swab  Result Value Ref Range Status   SARS Coronavirus 2 NEGATIVE NEGATIVE Final    Comment: (NOTE) SARS-CoV-2 target nucleic acids are NOT DETECTED.  The  SARS-CoV-2 RNA is generally detectable in upper and lower respiratory specimens during the acute phase of infection. The lowest concentration of SARS-CoV-2 viral copies this assay can detect is 250 copies / mL. A negative result does not preclude SARS-CoV-2 infection and should not be used as the sole basis for treatment or other patient management decisions.  A negative result may occur with improper specimen collection / handling, submission of specimen other than nasopharyngeal swab, presence of viral mutation(s) within the areas targeted by this assay, and inadequate number of viral copies (<250 copies / mL). A negative result must be combined with clinical observations, patient history, and epidemiological information.  Fact Sheet for Patients:   StrictlyIdeas.no  Fact Sheet for Healthcare Providers: BankingDealers.co.za  This test is not yet approved or  cleared by the [place] FDA and has been authorized for detection and/or diagnosis of SARS-CoV-2 by FDA under an Emergency Use Authorization (EUA).  This EUA will remain in effect (meaning this test can be used) for the duration of the COVID-19 declaration under Section 564(b)(1) of the Act, 21 U.S.C. section 360bbb-3(b)(1), unless the authorization is terminated or revoked sooner.  Performed at [facility], [address] [place], [place] [address]      Scheduled Meds:  folic acid  1 mg Oral Daily   LORazepam  0-4 mg Intravenous Q12H   metoprolol tartrate  25 mg Oral BID   multivitamin with minerals  1 tablet Oral Daily   [START ON 11/26/2019] pantoprazole  40 mg Intravenous Q12H   polyethylene glycol  17 g Oral Daily   sucralfate  1 g Oral TID WC & HS   thiamine  100 mg Oral Daily   Or   thiamine  100 mg Intravenous Daily   Continuous Infusions:  pantoprozole (PROTONIX) infusion 8 mg/hr (11/24/19 0849)    Assessment/Plan:  1. Esophageal  stricture with severe esophagitis.  Status post dilatation procedure.  On IV Protonix drip.  Gastroenterology okay with advancing to soft diet. 2. Hypomagnesemia and hypokalemia.  Potassium replaced into the normal range replace magnesium IV. 3. UTI with E. Coli.  Will give Rocephin. 4. Constipation.  MiraLAX worked 5. Essential hypertension tachycardia on metoprolol 6. Pancytopenia secondary to alcohol abuse.  Code Status:     Code Status Orders  (From admission, onward)  Start     Ordered   11/22/19 0357  Full code  Continuous        11/22/19 0359        Code Status History    Date Active Date Inactive Code Status Order ID Comments User Context   03/05/2019 0513 03/07/2019 2137 Full Code [id number]  Harrie Foreman, MD Inpatient   11/16/2018 2358 11/18/2018 1741 Full Code [id number]  Demetrios Loll, MD ED   Advance Care Planning Activity     Family Communication: Tried to call patients daughter without answer Disposition Plan: Status is: Inpatient  Dispo: The patient is from: Home              Anticipated d/c is to: Home              Anticipated d/c date is: 11/26/2019              Patient currently being treated for esophagitis and esophageal stricture.  Status post dilatation here.  GI recommended IV Protonix drip for 3 days.  Consultants:  Gastroenterology  Procedures:  Esophageal dilatation  Antibiotics:  Rocephin  Time spent: 26 minutes  [place]

## 2019-11-25 ENCOUNTER — Encounter: Payer: Self-pay | Admitting: Gastroenterology

## 2019-11-25 LAB — GLUCOSE, CAPILLARY: Glucose-Capillary: 165 mg/dL — ABNORMAL HIGH (ref 70–99)

## 2019-11-25 MED ORDER — METOPROLOL SUCCINATE ER 25 MG PO TB24: 12.5000 mg | ORAL_TABLET | Freq: Every day | ORAL | 0 refills | Status: DC

## 2019-11-25 MED ORDER — MAGNESIUM OXIDE -MG SUPPLEMENT 200 MG PO TABS: 200.0000 mg | ORAL_TABLET | Freq: Every morning | ORAL | 0 refills | Status: DC

## 2019-11-25 MED ORDER — FOLIC ACID 1 MG PO TABS: 1.0000 mg | ORAL_TABLET | Freq: Every day | ORAL | 0 refills | Status: DC

## 2019-11-25 MED ORDER — PANTOPRAZOLE SODIUM 40 MG PO TBEC
40.0000 mg | DELAYED_RELEASE_TABLET | Freq: Two times a day (BID) | ORAL | Status: DC
  Administered 2019-11-25: 40 mg via ORAL
  Filled 2019-11-25: qty 1

## 2019-11-25 MED ORDER — METOPROLOL SUCCINATE ER 25 MG PO TB24: 25.0000 mg | ORAL_TABLET | Freq: Every day | ORAL | Status: DC

## 2019-11-25 MED ORDER — POLYETHYLENE GLYCOL 3350 17 G PO PACK: 17.0000 g | PACK | Freq: Every day | ORAL | 0 refills | Status: DC | PRN

## 2019-11-25 MED ORDER — ONDANSETRON HCL 4 MG PO TABS: 4.0000 mg | ORAL_TABLET | Freq: Three times a day (TID) | ORAL | 0 refills | Status: DC | PRN

## 2019-11-25 MED ORDER — CEPHALEXIN 500 MG PO CAPS: 500.0000 mg | ORAL_CAPSULE | Freq: Three times a day (TID) | ORAL | 0 refills | Status: AC

## 2019-11-25 MED ORDER — OMEPRAZOLE 40 MG PO CPDR: 40.0000 mg | DELAYED_RELEASE_CAPSULE | Freq: Two times a day (BID) | ORAL | 0 refills | Status: DC

## 2019-11-25 MED ORDER — THIAMINE HCL 100 MG PO TABS: 100.0000 mg | ORAL_TABLET | Freq: Every day | ORAL | 0 refills | Status: DC

## 2019-11-25 MED ORDER — SUCRALFATE 1 GM/10ML PO SUSP: 1.0000 g | Freq: Three times a day (TID) | ORAL | 0 refills | Status: DC

## 2019-11-25 NOTE — Discharge Summary (Signed)
[place] [facility] [place] NAME: Donna Aguirre    MR#:  [id number]  DATE OF BIRTH:  [birth year]  DATE OF ADMISSION:  11/22/2019 ADMITTING PHYSICIAN: Loletha Grayer, MD  DATE OF DISCHARGE: 11/25/2019  2:10 PM  PRIMARY CARE PHYSICIAN: Patient, No Pcp Per    ADMISSION DIAGNOSIS:  Hypokalemia [E87.6] Hypomagnesemia [E83.42] Thrombocytopenia (HCC) [D69.6] Prolonged Q-T interval on ECG [R94.31] Abdominal pain [R10.9] Anemia, unspecified type [D64.9] Leukopenia, unspecified type [D72.819] Esophageal stricture [K22.2]  DISCHARGE DIAGNOSIS:  Principal Problem:   Abdominal pain Active Problems:   Alcohol use disorder, moderate, dependence (HCC)   Elevated troponin   Pancytopenia (HCC)   HTN (hypertension)   History of esophageal stricture on EGD 12/2018 [facility]   PUD (peptic ulcer disease), with hx of perforated gastric ulcer   Vomiting   Hypokalemia   Hypomagnesemia   Alcohol abuse   Esophageal dysphagia   Esophageal stricture   Esophagitis   E-coli UTI   Tachycardia   SECONDARY DIAGNOSIS:   Past Medical History:  Diagnosis Date  . Arthritis   . Diabetes mellitus without complication ([place])   . Gout   . Hypertension     HOSPITAL COURSE:   1.  Esophageal stricture with severe esophagitis.  Patient had an endoscopy on 11/23/2019 with dilatation.  She was placed on IV Protonix drip after that.  Gastroenterology saw on 11/24/2019 and was okay with advancing to soft diet and discharging the following day.  Follow-up with GI as outpatient because patient will likely need further dilatation procedures in the future.  Soft diet.  Omeprazole 40 mg twice daily.  Carafate 4 times daily. 2.  Hypomagnesemia and hypokalemia potassium replaced into the normal range and patient was given magnesium IV during the hospital course will also give magnesium orally upon going home. 3.  UTI with E. coli.  I did give couple doses of Rocephin will give 2 more days of  Keflex upon going home 4.  Constipation.  MiraLAX helped will have as needed as outpatient 5.  Essential hypertension and tachycardia.  Since heart rate and blood pressure did come down I will decrease the dose of the metoprolol down to 12.5 mg nightly 6.  Pancytopenia secondary to alcohol abuse.  Upon discharge white blood cell count 2.8 platelet count of 151 and hemoglobin 12.5.  Patient advised not to drink any alcohol.  DISCHARGE CONDITIONS:   Satisfactory  CONSULTS OBTAINED:  Treatment Team:  Jonathon Bellows, MD  DRUG ALLERGIES:  No Known Allergies  DISCHARGE MEDICATIONS:   Allergies as of 11/25/2019   No Known Allergies     Medication List    STOP taking these medications   lisinopril 10 MG tablet Commonly known as: ZESTRIL   pantoprazole 40 MG tablet Commonly known as: PROTONIX     TAKE these medications   cephALEXin 500 MG capsule Commonly known as: KEFLEX Take 1 capsule (500 mg total) by mouth 3 (three) times daily for 2 days.   folic acid 1 MG tablet Commonly known as: FOLVITE Take 1 tablet (1 mg total) by mouth daily.   Magnesium Oxide 200 MG Tabs Take 1 tablet (200 mg total) by mouth every morning.   metoprolol succinate 25 MG 24 hr tablet Commonly known as: TOPROL-XL Take 0.5 tablets (12.5 mg total) by mouth at bedtime.   omeprazole 40 MG capsule Commonly known as: PRILOSEC Take 1 capsule (40 mg total) by mouth 2 (two) times daily.   ondansetron 4 MG tablet  Commonly known as: Zofran Take 1 tablet (4 mg total) by mouth every 8 (eight) hours as needed for nausea or vomiting.   polyethylene glycol 17 g packet Commonly known as: MIRALAX / GLYCOLAX Take 17 g by mouth daily as needed for moderate constipation.   sucralfate 1 GM/10ML suspension Commonly known as: CARAFATE Take 10 mLs (1 g total) by mouth 4 (four) times daily -  with meals and at bedtime.   thiamine 100 MG tablet Take 1 tablet (100 mg total) by mouth daily.        DISCHARGE  INSTRUCTIONS:   Follow-up with Dr. On your Medicaid card 1 week Follow-up with gastroenterology Dr. Marius Ditch in 2 weeks  If you experience worsening of your admission symptoms, develop shortness of breath, life threatening emergency, suicidal or homicidal thoughts you must seek medical attention immediately by calling 911 or calling your MD immediately  if symptoms less severe.  You Must read complete instructions/literature along with all the possible adverse reactions/side effects for all the Medicines you take and that have been prescribed to you. Take any new Medicines after you have completely understood and accept all the possible adverse reactions/side effects.   Please note  You were cared for by a hospitalist during your hospital stay. If you have any questions about your discharge medications or the care you received while you were in the hospital after you are discharged, you can call the unit and asked to speak with the hospitalist on call if the hospitalist that took care of you is not available. Once you are discharged, your primary care physician will handle any further medical issues. Please note that NO REFILLS for any discharge medications will be authorized once you are discharged, as it is imperative that you return to your primary care physician (or establish a relationship with a primary care physician if you do not have one) for your aftercare needs so that they can reassess your need for medications and monitor your lab values.    Today   CHIEF COMPLAINT:   Chief Complaint  Patient presents with  . Abdominal Pain    HISTORY OF PRESENT ILLNESS:  Donna Aguirre  is a 64 y.o. female came in with abdominal pain and vomiting   VITAL SIGNS:  Blood pressure 103/66, pulse (!) 59, temperature 98.1 F (36.7 C), temperature source Oral, resp. rate 17, height 5\' 4"  (1.626 m), weight 66.2 kg, SpO2 100 %.   PHYSICAL EXAMINATION:  GENERAL:  64 y.o.-year-old patient lying in the  bed with no acute distress.  EYES: Pupils equal, round, reactive to light and accommodation. No scleral icterus. HEENT: Head atraumatic, normocephalic. Oropharynx and nasopharynx clear.   LUNGS: Normal breath sounds bilaterally, no wheezing, rales,rhonchi or crepitation. No use of accessory muscles of respiration.  CARDIOVASCULAR: S1, S2 normal. No murmurs, rubs, or gallops.  ABDOMEN: Soft, non-tender, non-distended.  EXTREMITIES: No pedal edema.  NEUROLOGIC: Cranial nerves II through XII are intact. Muscle strength 5/5 in all extremities. Sensation intact. Gait not checked.  PSYCHIATRIC: The patient is alert and oriented x 3.  SKIN: No obvious rash, lesion, or ulcer.   DATA REVIEW:   CBC Recent Labs  Lab 11/22/19 0957  WBC 2.8*  HGB 12.5  HCT 35.0*  PLT 151    Chemistries  Recent Labs  Lab 11/21/19 2332 11/22/19 0957 11/24/19 0524  NA  --    < > 135  K  --    < > 4.1  CL  --    < >  100  CO2  --    < > 28  GLUCOSE  --    < > 97  BUN  --    < > <5*  CREATININE  --    < > 0.47  CALCIUM  --    < > 8.9  MG  --    < > 1.5*  AST 27  --   --   ALT 14  --   --   ALKPHOS 72  --   --   BILITOT 1.1  --   --    < > = values in this interval not displayed.    Microbiology Results  Results for orders placed or performed during the hospital encounter of 11/22/19  Urine culture     Status: Abnormal   Collection Time: 11/21/19 11:09 PM   Specimen: Urine, Random  Result Value Ref Range Status   Specimen Description   Final    URINE, RANDOM Performed at [facility], [address] [place], [place] [address]    Special Requests   Final    NONE Performed at [facility], [address] [place], [place] [address]    Culture >=100,000 COLONIES/mL ESCHERICHIA COLI (A)  Final   Report Status 11/24/2019 FINAL  Final   Organism ID, Bacteria ESCHERICHIA COLI (A)  Final      Susceptibility   Escherichia coli - MIC*    AMPICILLIN >=32 RESISTANT Resistant      CEFAZOLIN <=4 SENSITIVE Sensitive     CEFTRIAXONE <=0.25 SENSITIVE Sensitive     CIPROFLOXACIN <=0.25 SENSITIVE Sensitive     GENTAMICIN >=16 RESISTANT Resistant     IMIPENEM <=0.25 SENSITIVE Sensitive     NITROFURANTOIN <=16 SENSITIVE Sensitive     TRIMETH/SULFA >=320 RESISTANT Resistant     AMPICILLIN/SULBACTAM >=32 RESISTANT Resistant     PIP/TAZO <=4 SENSITIVE Sensitive     * >=100,000 COLONIES/mL ESCHERICHIA COLI  SARS Coronavirus 2 by RT PCR (hospital order, performed in [place] hospital lab) Nasopharyngeal Nasopharyngeal Swab     Status: None   Collection Time: 11/22/19  4:00 AM   Specimen: Nasopharyngeal Swab  Result Value Ref Range Status   SARS Coronavirus 2 NEGATIVE NEGATIVE Final    Comment: (NOTE) SARS-CoV-2 target nucleic acids are NOT DETECTED.  The SARS-CoV-2 RNA is generally detectable in upper and lower respiratory specimens during the acute phase of infection. The lowest concentration of SARS-CoV-2 viral copies this assay can detect is 250 copies / mL. A negative result does not preclude SARS-CoV-2 infection and should not be used as the sole basis for treatment or other patient management decisions.  A negative result may occur with improper specimen collection / handling, submission of specimen other than nasopharyngeal swab, presence of viral mutation(s) within the areas targeted by this assay, and inadequate number of viral copies (<250 copies / mL). A negative result must be combined with clinical observations, patient history, and epidemiological information.  Fact Sheet for Patients:   StrictlyIdeas.no  Fact Sheet for Healthcare Providers: BankingDealers.co.za  This test is not yet approved or  cleared by the [place] FDA and has been authorized for detection and/or diagnosis of SARS-CoV-2 by FDA under an Emergency Use Authorization (EUA).  This EUA will remain in effect (meaning this test can be  used) for the duration of the COVID-19 declaration under Section 564(b)(1) of the Act, 21 U.S.C. section 360bbb-3(b)(1), unless the authorization is terminated or revoked sooner.  Performed at [facility],  [place], [place] [address]     Management plans discussed with the patient, family (daughter Ivin Booty on the phone at [phone])and they are in agreement.  CODE STATUS:     Code Status Orders  (From admission, onward)         Start     Ordered   11/22/19 0357  Full code  Continuous        11/22/19 0359        Code Status History    Date Active Date Inactive Code Status Order ID Comments User Context   03/05/2019 0513 03/07/2019 2137 Full Code [id number]  Harrie Foreman, MD Inpatient   11/16/2018 2358 11/18/2018 1741 Full Code [id number]  Demetrios Loll, MD ED   Advance Care Planning Activity      TOTAL TIME TAKING CARE OF THIS PATIENT: 34 minutes.    Loletha Grayer M.D on 11/25/2019 at 3:16 PM  Between 7am to 6pm - Pager - [phone]  After 6pm go to [email or web address] - password [email or web address]  [facility]  CC: Primary care physician; Patient, No Pcp Per

## 2019-11-25 NOTE — Discharge Instructions (Signed)
Take short frequent walks to prevent pneumonia and blood clots

## 2019-11-25 NOTE — Progress Notes (Signed)
Patient received and understands discharge instuctions

## 2019-11-25 NOTE — TOC Initial Note (Signed)
Transition of Care [facility]) - Initial/Assessment Note    Patient Details  Name: Donna Aguirre MRN: [id number] Date of Birth: Jun 05, 1955  Transition of Care [facility]) CM/SW Contact:    Shelbie Hutching, RN Phone Number: 11/25/2019, 11:13 AM  Clinical Narrative:                 RNCM received consult for substance abuse resources.  Patient reports that she is already going through a program but she accepted the resources given for outpatient and inpatient programs in [place].  Patient reports that he daughter lives with her and will be coming to pick her up today.   Expected Discharge Plan: Home/Self Care Barriers to Discharge: Barriers Resolved   Patient Goals and CMS Choice        Expected Discharge Plan and Services Expected Discharge Plan: Home/Self Care   Discharge Planning Services: CM Consult, Other - See comment (substance abuse resources)   Living arrangements for the past 2 months: Apartment Expected Discharge Date: 11/25/19                                    Prior Living Arrangements/Services Living arrangements for the past 2 months: Apartment Lives with:: Adult Children Patient language and need for interpreter reviewed:: Yes Do you feel safe going back to the place where you live?: Yes      Need for Family Participation in Patient Care: Yes (Comment) (ETOH abuse) Care giver support system in place?: Yes (comment) (daughter)   Criminal Activity/Legal Involvement Pertinent to Current Situation/Hospitalization: No - Comment as needed  Activities of Daily Living Home Assistive Devices/Equipment: None ADL Screening (condition at time of admission) Patient's cognitive ability adequate to safely complete daily activities?: Yes Is the patient deaf or have difficulty hearing?: No Does the patient have difficulty seeing, even when wearing glasses/contacts?: No Does the patient have difficulty concentrating, remembering, or making decisions?: No Patient able  to express need for assistance with ADLs?: Yes Does the patient have difficulty dressing or bathing?: No Independently performs ADLs?: Yes (appropriate for developmental age) Does the patient have difficulty walking or climbing stairs?: Yes Weakness of Legs: Both Weakness of Arms/Hands: None  Permission Sought/Granted                  Emotional Assessment Appearance:: Appears older than stated age Attitude/Demeanor/Rapport: Engaged Affect (typically observed): Accepting Orientation: : Oriented to Self, Oriented to Place, Oriented to  Time, Oriented to Situation Alcohol / Substance Use: Alcohol Use Psych Involvement: No (comment)  Admission diagnosis:  Hypokalemia [E87.6] Hypomagnesemia [E83.42] Thrombocytopenia (HCC) [D69.6] Prolonged Q-T interval on ECG [R94.31] Abdominal pain [R10.9] Anemia, unspecified type [D64.9] Leukopenia, unspecified type [D72.819] Esophageal stricture [K22.2] Patient Active Problem List   Diagnosis Date Noted  . E-coli UTI   . Tachycardia   . Esophageal stricture 11/23/2019  . Esophageal dysphagia   . Esophagitis   . Abdominal pain 11/22/2019  . Alcohol use disorder, moderate, dependence ([place]) 11/22/2019  . Elevated troponin 11/22/2019  . Pancytopenia ([place]) 11/22/2019  . HTN (hypertension) 11/22/2019  . History of esophageal stricture on EGD 12/2018 [facility] 11/22/2019  . PUD (peptic ulcer disease), with hx of perforated gastric ulcer 11/22/2019  . Vomiting 11/22/2019  . Hypokalemia 11/22/2019  . Hypomagnesemia 11/22/2019  . Alcohol abuse   . Hyponatremia 11/16/2018   PCP:  Patient, No Pcp Per Pharmacy:   [facility] (  N), [place] [facility] [place] [facility]) [place] [address] Phone: [phone] Fax: [phone]     Social Determinants of Health (SDOH) Interventions    Readmission Risk Interventions No flowsheet data found.

## 2019-12-03 ENCOUNTER — Telehealth: Payer: Self-pay

## 2019-12-03 NOTE — Telephone Encounter (Addendum)
Tried to call daughter on both numbers and they are disconnected

## 2019-12-03 NOTE — Telephone Encounter (Signed)
Did you try reaching out to her daughter  RV

## 2019-12-03 NOTE — Telephone Encounter (Signed)
Per last EGD patient needed a EGD in 4 weeks. Called home number and voicemail is not set up Mobile number states that it is the wrong number. Will try again at a later time

## 2019-12-04 NOTE — Telephone Encounter (Signed)
Mailed letter to patient

## 2019-12-04 NOTE — Telephone Encounter (Signed)
Patient voicemail was not set up. Tried patient daughter on both numbers they are both disconnected.

## 2020-01-22 ENCOUNTER — Other Ambulatory Visit: Payer: Self-pay

## 2020-01-22 ENCOUNTER — Encounter: Payer: Self-pay | Admitting: Gastroenterology

## 2020-01-22 ENCOUNTER — Ambulatory Visit (INDEPENDENT_AMBULATORY_CARE_PROVIDER_SITE_OTHER): Payer: Medicaid Other | Admitting: Gastroenterology

## 2020-01-22 VITALS — BP 125/83 | HR 68 | Temp 98.2°F | Ht 64.0 in | Wt 138.2 lb

## 2020-01-22 DIAGNOSIS — Z9889 Other specified postprocedural states: Secondary | ICD-10-CM | POA: Diagnosis not present

## 2020-01-22 DIAGNOSIS — K209 Esophagitis, unspecified without bleeding: Secondary | ICD-10-CM

## 2020-01-22 DIAGNOSIS — K222 Esophageal obstruction: Secondary | ICD-10-CM

## 2020-01-22 DIAGNOSIS — K2210 Ulcer of esophagus without bleeding: Secondary | ICD-10-CM | POA: Diagnosis not present

## 2020-01-22 MED ORDER — OMEPRAZOLE 40 MG PO CPDR: 40.0000 mg | DELAYED_RELEASE_CAPSULE | Freq: Two times a day (BID) | ORAL | 1 refills | Status: DC

## 2020-01-22 NOTE — Progress Notes (Signed)
Donna Darby, MD [address]  [place]  [place], [place] [address]  Main: [phone]  Fax: [phone]    Gastroenterology Consultation  Referring Provider:     No ref. provider found Primary Care Physician:  Patient, No Pcp Per Primary Gastroenterologist:  Dr. Cephas Aguirre Reason for Consultation:     Dysphagia, esophageal stricture and esophagitis        HPI:   Donna Aguirre is a 64 y.o. female referred by Dr. Patient, No Pcp Per  for consultation & management of chronic dysphagia secondary to esophageal stricture and erosive esophagitis.  Patient was originally admitted to [facility] in 10/2019 secondary to abdominal pain and vomiting.  CT revealed of the esophagus.  Subsequently, she underwent upper endoscopy which revealed 2 strictures in proximal and distal esophagus, stricture in the distal esophagus at 35 cm was dilated to 10 mm.  She also has severe erosive esophagitis, LA grade D.  She was discharged on omeprazole 40 mg 2 times daily and sucralfate.  Patient was recommended to have a repeat UTI progress.  However, she did not follow-up for the procedure.  She is here today for hospital discharge follow-up.  She reports that she ran out of medication about a month ago when she went to pharmacy to check for bleeding.  She has been gaining weight, she denies any difficulty swallowing.  Patient came by herself.  She says she has cut back on alcohol and smoking significantly.  However, could not quantify how much she drinks.  She denies any other GI symptoms today  NSAIDs: None  Antiplts/Anticoagulants/Anti thrombotics: None  GI Procedures:  EGD 11/23/2019 - Benign-appearing esophageal stenoses. Dilated to 10 mm. - LA Grade D erosive esophagitis with no bleeding. - Erosive gastropathy with no bleeding and no stigmata of recent bleeding. - Normal duodenal bulb and second portion of the duodenum. - No specimens collected.  Past Medical History:  Diagnosis Date  .  Abdominal pain 11/22/2019  . Arthritis   . Diabetes mellitus without complication ([place])   . Gout   . Hypertension   . Hypokalemia 11/22/2019  . Hypomagnesemia 11/22/2019  . Hyponatremia 11/16/2018    Past Surgical History:  Procedure Laterality Date  . ESOPHAGOGASTRODUODENOSCOPY (EGD) WITH PROPOFOL N/A 11/23/2019   Procedure: ESOPHAGOGASTRODUODENOSCOPY (EGD) WITH PROPOFOL;  Surgeon: [name], MD;  Location: [facility];  Service: Gastroenterology;  Laterality: N/A;  . perforated gastric ulcer      Current Outpatient Medications:  .  folic acid (FOLVITE) 1 MG tablet, Take 1 tablet (1 mg total) by mouth daily., Disp: 30 tablet, Rfl: 0 .  Magnesium Oxide 200 MG TABS, Take 1 tablet (200 mg total) by mouth every morning., Disp: 30 tablet, Rfl: 0 .  metoprolol succinate (TOPROL-XL) 25 MG 24 hr tablet, Take 0.5 tablets (12.5 mg total) by mouth at bedtime., Disp: 15 tablet, Rfl: 0 .  omeprazole (PRILOSEC) 40 MG capsule, Take 1 capsule (40 mg total) by mouth 2 (two) times daily before a meal., Disp: 180 capsule, Rfl: 1 .  ondansetron (ZOFRAN) 4 MG tablet, Take 1 tablet (4 mg total) by mouth every 8 (eight) hours as needed for nausea or vomiting., Disp: 20 tablet, Rfl: 0 .  polyethylene glycol (MIRALAX / GLYCOLAX) 17 g packet, Take 17 g by mouth daily as needed for moderate constipation., Disp: 14 each, Rfl: 0 .  sucralfate (CARAFATE) 1 GM/10ML suspension, Take 10 mLs (1 g total) by mouth 4 (four) times daily -  with meals and at bedtime., Disp: 420 mL, Rfl: 0 .  thiamine 100 MG tablet, Take 1 tablet (100 mg total) by mouth daily., Disp: 30 tablet, Rfl: 0   History reviewed. No pertinent family history.   Social History   Tobacco Use  . Smoking status: Current Some Day Smoker    Types: Cigarettes  . Smokeless tobacco: Current User    Types: Snuff  Substance Use Topics  . Alcohol use: Yes    Alcohol/week: 7.0 standard drinks    Types: 7 Cans of beer per week    Comment: one 40  oz. states she drinks everytime some one brings her something   . Drug use: No    Allergies as of 01/22/2020  . (No Known Allergies)    Review of Systems:    All systems reviewed and negative except where noted in HPI.   Physical Exam:  BP 125/83 (BP Location: Left Arm, Patient Position: Sitting, Cuff Size: Normal)   Pulse 68   Temp 98.2 F (36.8 C) (Oral)   Ht 5\' 4"  (1.626 m)   Wt 138 lb 4 oz (62.7 kg)   BMI 23.73 kg/m  No LMP recorded. Patient is postmenopausal.  General:   Alert, ill groomed, well-developed, well-nourished, pleasant and cooperative in NAD Head:  Normocephalic and atraumatic. Eyes:  Sclera clear, no icterus.   Conjunctiva pink. Ears:  Normal auditory acuity. Nose:  No deformity, discharge, or lesions. Mouth:  No deformity or lesions,oropharynx pink & moist. Neck:  Supple; no masses or thyromegaly. Lungs:  Respirations even and unlabored.  Clear throughout to auscultation.   No wheezes, crackles, or rhonchi. No acute distress. Heart:  Regular rate and rhythm; no murmurs, clicks, rubs, or gallops. Abdomen:  Normal bowel sounds. Soft, non-tender and non-distended without masses, hepatosplenomegaly or hernias noted.  No guarding or rebound tenderness.   Rectal: Not performed Msk:  Symmetrical without gross deformities. Good, equal movement & strength bilaterally. Pulses:  Normal pulses noted. Extremities:  No clubbing or edema.  No cyanosis. Neurologic:  Alert and oriented x3;  grossly normal neurologically. Skin:  Intact without significant lesions or rashes. No jaundice. Psych:  Alert and cooperative. Normal mood and affect.  Imaging Studies: Reviewed  Assessment and Plan:   [name] is a 64 y.o. African-American female with history of tobacco and alcohol use is seen in consultation for follow-up of esophageal stricture, erosive esophagitis s/p balloon dilation of the distal esophageal stricture to 10 mm  Dysphagia secondary to peptic stricture  in esophagus Reiterated to the patient about importance of repeat upper endoscopy to confirm healing of erosive esophagitis as well as to rule out underlying Barrett's and dilation of the stricture.  Patient, reluctantantly refused to undergo EGD as she states she is feeling significantly better Refilled omeprazole 40 mg twice daily before meals  Patient is also overdue for screening colonoscopy   Follow up in 3 to 4 months   Donna Darby, MD

## 2020-01-23 ENCOUNTER — Telehealth: Payer: Self-pay

## 2020-01-23 NOTE — Telephone Encounter (Signed)
-----   Message from [name], MD sent at 01/22/2020  2:39 PM EDT ----- Regarding: Re: EGD and colonoscopy Please call patient's daughter tomorrow to discuss about my recommendations for her history of esophageal stricture, to undergo upper endoscopyColonoscopy as she is past due for colon cancer screening  Thanks RV

## 2020-01-23 NOTE — Telephone Encounter (Signed)
Received fax that patient Omeprazole 40mg  taking twice a day required a PA. Did PA through cover my meds. Sent information to [facility]

## 2020-01-23 NOTE — Telephone Encounter (Signed)
Tried to call sharon patient daughter and her home number and mobile number were disconnected.

## 2020-02-10 ENCOUNTER — Other Ambulatory Visit: Admission: RE | Admit: 2020-02-10 | Payer: Medicaid Other | Source: Ambulatory Visit

## 2020-02-11 ENCOUNTER — Telehealth: Payer: Self-pay

## 2020-02-11 NOTE — Telephone Encounter (Signed)
Tried to call patient on home number and voicemail is not set up and tried to call mobile number and that number is the wrong number for patient   Tried to call  Emergency contact daughter and the number is disconnected. Patient did not go for covid test so procedure will have to be canceled

## 2020-02-12 ENCOUNTER — Ambulatory Visit: Admission: RE | Admit: 2020-02-12 | Payer: Medicaid Other | Source: Home / Self Care | Admitting: Gastroenterology

## 2020-02-12 ENCOUNTER — Encounter: Admission: RE | Payer: Self-pay | Source: Home / Self Care

## 2020-02-12 SURGERY — ESOPHAGOGASTRODUODENOSCOPY (EGD) WITH PROPOFOL
Anesthesia: General

## 2020-07-19 ENCOUNTER — Inpatient Hospital Stay
Admission: EM | Admit: 2020-07-19 | Discharge: 2020-07-21 | DRG: 178 | Disposition: A | Discharge status: Home / Self Care | Payer: Medicaid Other | Attending: Hospitalist | Admitting: Hospitalist

## 2020-07-19 ENCOUNTER — Other Ambulatory Visit: Payer: Self-pay

## 2020-07-19 ENCOUNTER — Emergency Department: Payer: Medicaid Other

## 2020-07-19 ENCOUNTER — Observation Stay: Payer: Medicaid Other

## 2020-07-19 DIAGNOSIS — F17200 Nicotine dependence, unspecified, uncomplicated: Secondary | ICD-10-CM | POA: Diagnosis present

## 2020-07-19 DIAGNOSIS — A0839 Other viral enteritis: Secondary | ICD-10-CM | POA: Diagnosis present

## 2020-07-19 DIAGNOSIS — I10 Essential (primary) hypertension: Secondary | ICD-10-CM | POA: Diagnosis present

## 2020-07-19 DIAGNOSIS — R1084 Generalized abdominal pain: Secondary | ICD-10-CM

## 2020-07-19 DIAGNOSIS — M109 Gout, unspecified: Secondary | ICD-10-CM | POA: Diagnosis present

## 2020-07-19 DIAGNOSIS — U071 COVID-19: Principal | ICD-10-CM | POA: Diagnosis present

## 2020-07-19 DIAGNOSIS — E876 Hypokalemia: Secondary | ICD-10-CM | POA: Diagnosis present

## 2020-07-19 DIAGNOSIS — F1010 Alcohol abuse, uncomplicated: Secondary | ICD-10-CM

## 2020-07-19 DIAGNOSIS — R112 Nausea with vomiting, unspecified: Secondary | ICD-10-CM

## 2020-07-19 DIAGNOSIS — R9431 Abnormal electrocardiogram [ECG] [EKG]: Secondary | ICD-10-CM

## 2020-07-19 DIAGNOSIS — R0602 Shortness of breath: Secondary | ICD-10-CM

## 2020-07-19 DIAGNOSIS — E119 Type 2 diabetes mellitus without complications: Secondary | ICD-10-CM | POA: Diagnosis present

## 2020-07-19 DIAGNOSIS — F1020 Alcohol dependence, uncomplicated: Secondary | ICD-10-CM | POA: Diagnosis present

## 2020-07-19 DIAGNOSIS — R400 Somnolence: Secondary | ICD-10-CM | POA: Diagnosis present

## 2020-07-19 DIAGNOSIS — F17210 Nicotine dependence, cigarettes, uncomplicated: Secondary | ICD-10-CM | POA: Diagnosis present

## 2020-07-19 DIAGNOSIS — Z79899 Other long term (current) drug therapy: Secondary | ICD-10-CM

## 2020-07-19 LAB — URINALYSIS, COMPLETE (UACMP) WITH MICROSCOPIC
Bilirubin Urine: NEGATIVE
Glucose, UA: NEGATIVE mg/dL
Hgb urine dipstick: NEGATIVE
Ketones, ur: 5 mg/dL — AB
Leukocytes,Ua: NEGATIVE
Nitrite: NEGATIVE
Protein, ur: NEGATIVE mg/dL
Specific Gravity, Urine: 1.008 (ref 1.005–1.030)
pH: 7 (ref 5.0–8.0)

## 2020-07-19 LAB — COMPREHENSIVE METABOLIC PANEL
ALT: 10 U/L (ref 0–44)
AST: 25 U/L (ref 15–41)
Albumin: 3.7 g/dL (ref 3.5–5.0)
Alkaline Phosphatase: 65 U/L (ref 38–126)
Anion gap: 13 (ref 5–15)
BUN: <5 mg/dL — ABNORMAL LOW (ref 8–23)
CO2: 28 mmol/L (ref 22–32)
Calcium: 8.7 mg/dL — ABNORMAL LOW (ref 8.9–10.3)
Chloride: 91 mmol/L — ABNORMAL LOW (ref 98–111)
Creatinine, Ser: 0.52 mg/dL (ref 0.44–1.00)
GFR, Estimated: >60 mL/min (ref >60)
Glucose, Bld: 116 mg/dL — ABNORMAL HIGH (ref 70–99)
Potassium: 2.4 mmol/L — CL (ref 3.5–5.1)
Sodium: 132 mmol/L — ABNORMAL LOW (ref 135–145)
Total Bilirubin: 1 mg/dL (ref 0.3–1.2)
Total Protein: 8.5 g/dL — ABNORMAL HIGH (ref 6.5–8.1)

## 2020-07-19 LAB — CBC WITH DIFFERENTIAL/PLATELET
Abs Immature Granulocytes: 0.01 10*3/uL (ref 0.00–0.07)
Basophils Absolute: 0 10*3/uL (ref 0.0–0.1)
Basophils Relative: 1 %
Eosinophils Absolute: 0.1 10*3/uL (ref 0.0–0.5)
Eosinophils Relative: 1 %
HCT: 34.7 % — ABNORMAL LOW (ref 36.0–46.0)
Hemoglobin: 11.8 g/dL — ABNORMAL LOW (ref 12.0–15.0)
Immature Granulocytes: 0 %
Lymphocytes Relative: 24 %
Lymphs Abs: 0.9 10*3/uL (ref 0.7–4.0)
MCH: 26.9 pg (ref 26.0–34.0)
MCHC: 34 g/dL (ref 30.0–36.0)
MCV: 79.2 fL — ABNORMAL LOW (ref 80.0–100.0)
Monocytes Absolute: 0.4 10*3/uL (ref 0.1–1.0)
Monocytes Relative: 10 %
Neutro Abs: 2.4 10*3/uL (ref 1.7–7.7)
Neutrophils Relative %: 64 %
Platelets: 183 10*3/uL (ref 150–400)
RBC: 4.38 MIL/uL (ref 3.87–5.11)
RDW: 16.7 % — ABNORMAL HIGH (ref 11.5–15.5)
WBC: 3.8 10*3/uL — ABNORMAL LOW (ref 4.0–10.5)
nRBC: 0 % (ref 0.0–0.2)

## 2020-07-19 LAB — CBC
HCT: 37.3 % (ref 36.0–46.0)
Hemoglobin: 12.5 g/dL (ref 12.0–15.0)
MCH: 26.8 pg (ref 26.0–34.0)
MCHC: 33.5 g/dL (ref 30.0–36.0)
MCV: 79.9 fL — ABNORMAL LOW (ref 80.0–100.0)
Platelets: 191 10*3/uL (ref 150–400)
RBC: 4.67 MIL/uL (ref 3.87–5.11)
RDW: 16.9 % — ABNORMAL HIGH (ref 11.5–15.5)
WBC: 4.2 10*3/uL (ref 4.0–10.5)
nRBC: 0 % (ref 0.0–0.2)

## 2020-07-19 LAB — LACTIC ACID, PLASMA: Lactic Acid, Venous: 1.6 mmol/L (ref 0.5–1.9)

## 2020-07-19 LAB — LIPASE, BLOOD: Lipase: 20 U/L (ref 11–51)

## 2020-07-19 LAB — MAGNESIUM
Magnesium: 1.4 mg/dL — ABNORMAL LOW (ref 1.7–2.4)
Magnesium: 2.4 mg/dL (ref 1.7–2.4)

## 2020-07-19 LAB — POC SARS CORONAVIRUS 2 AG -  ED: SARS Coronavirus 2 Ag: NEGATIVE

## 2020-07-19 LAB — PROTIME-INR
INR: 1.3 — ABNORMAL HIGH (ref 0.8–1.2)
Prothrombin Time: 16.1 seconds — ABNORMAL HIGH (ref 11.4–15.2)

## 2020-07-19 LAB — RESP PANEL BY RT-PCR (FLU A&B, COVID) ARPGX2
Influenza A by PCR: NEGATIVE
Influenza B by PCR: NEGATIVE
SARS Coronavirus 2 by RT PCR: POSITIVE — AB

## 2020-07-19 LAB — PHOSPHORUS: Phosphorus: 2.6 mg/dL (ref 2.5–4.6)

## 2020-07-19 IMAGING — CR DG ABDOMEN ACUTE W/ 1V CHEST
4 series · 4 of 4 positions shown · non-contrast
Comparison: Abdominal CT [DATE]

CLINICAL DATA: Abdominal pain and vomiting

EXAM:
DG ABDOMEN ACUTE WITH 1 VIEW CHEST

[abdomen erect]
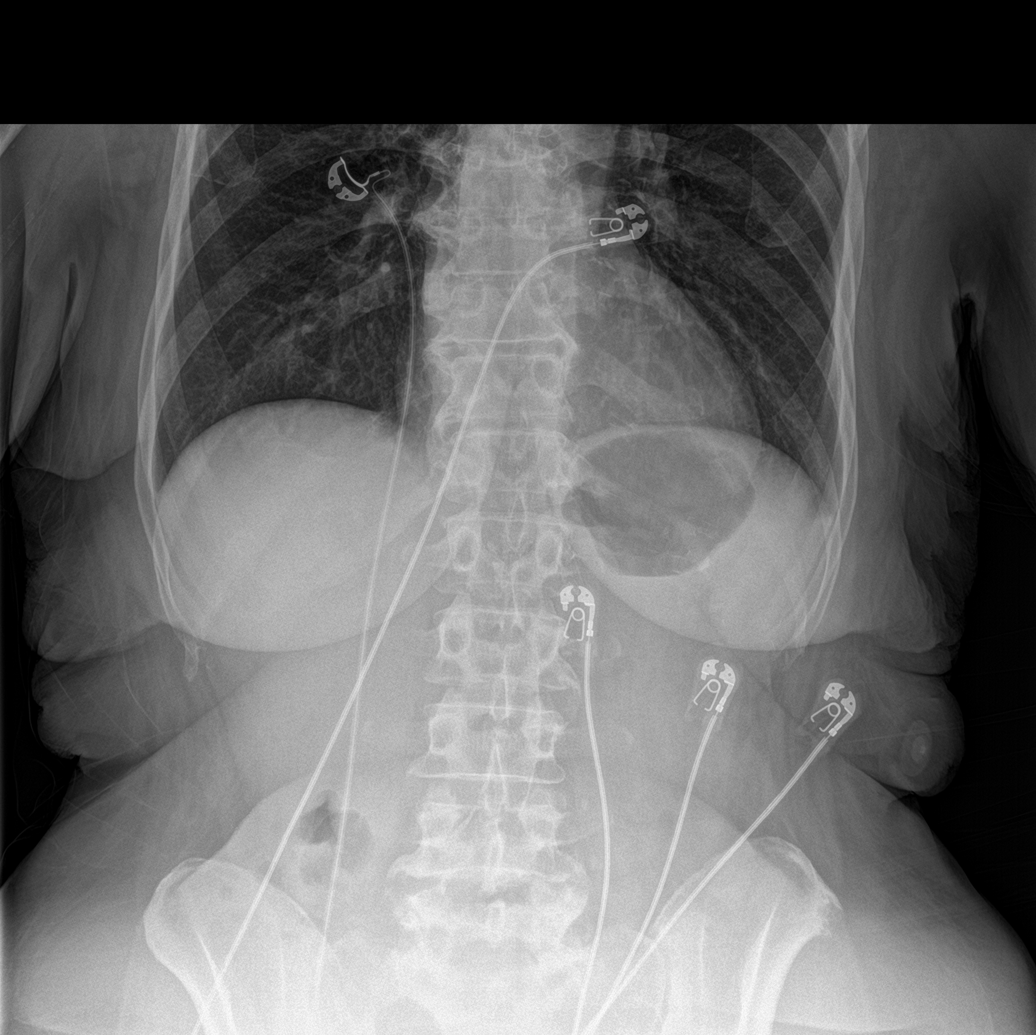

[abdomen kub (1 of 2)]
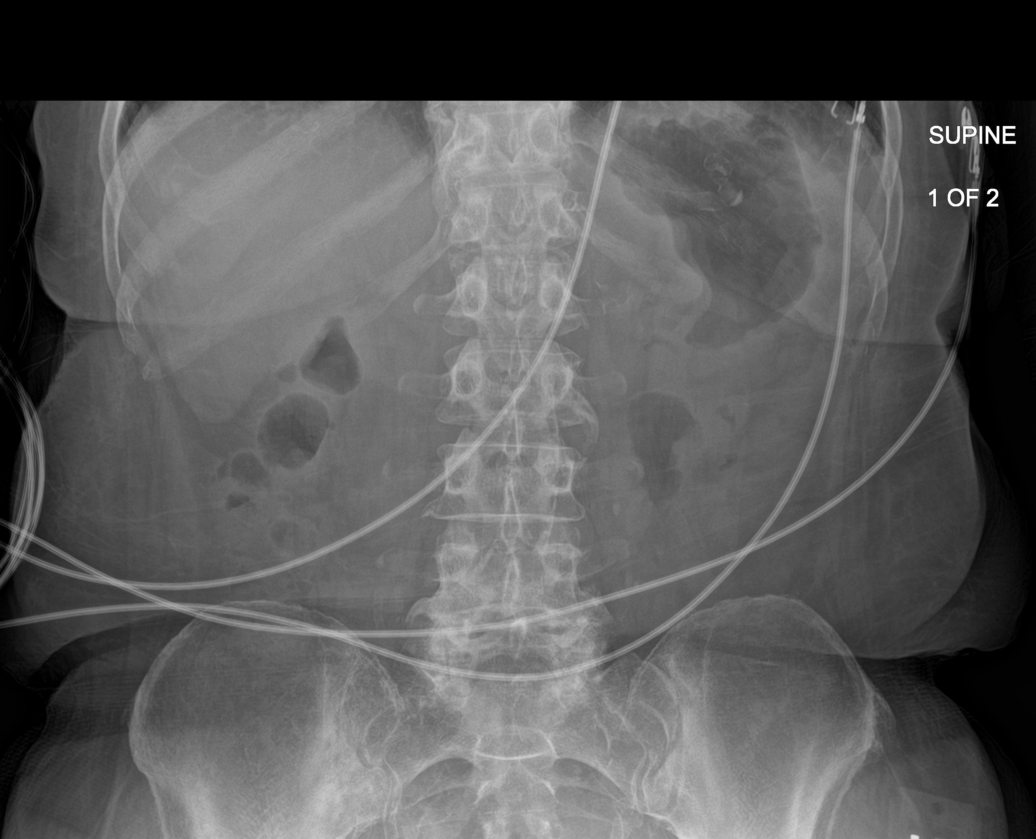

[abdomen kub (2 of 2)]
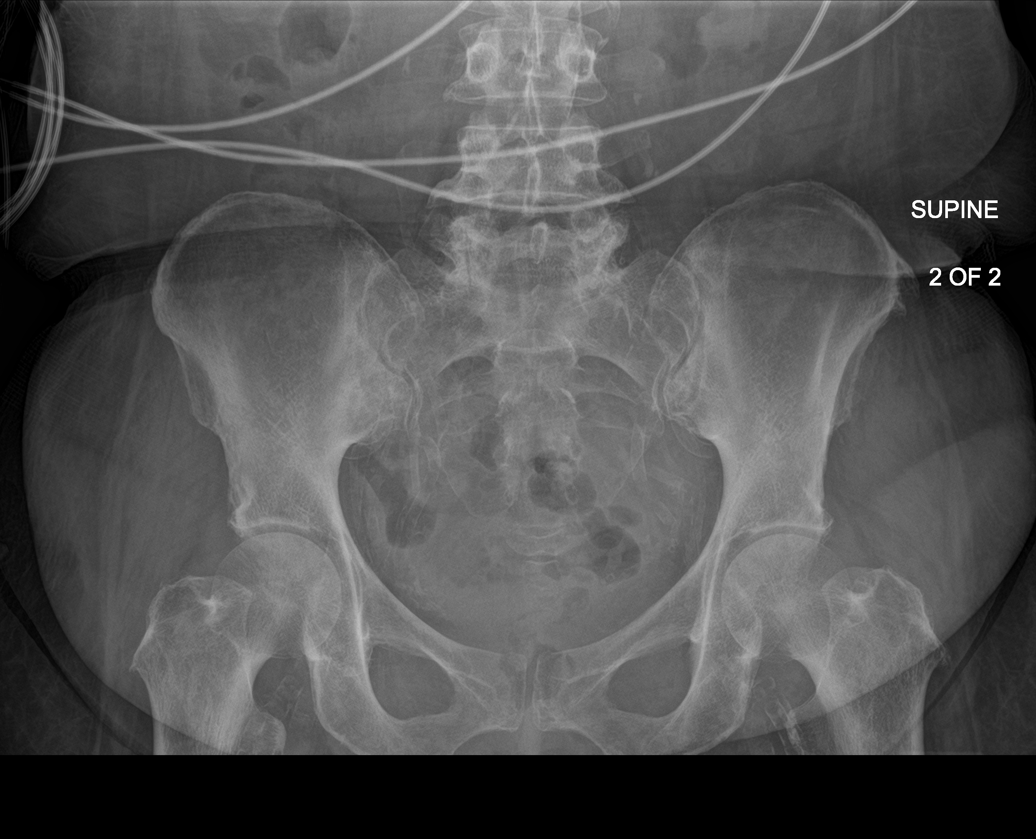

[chest pa]
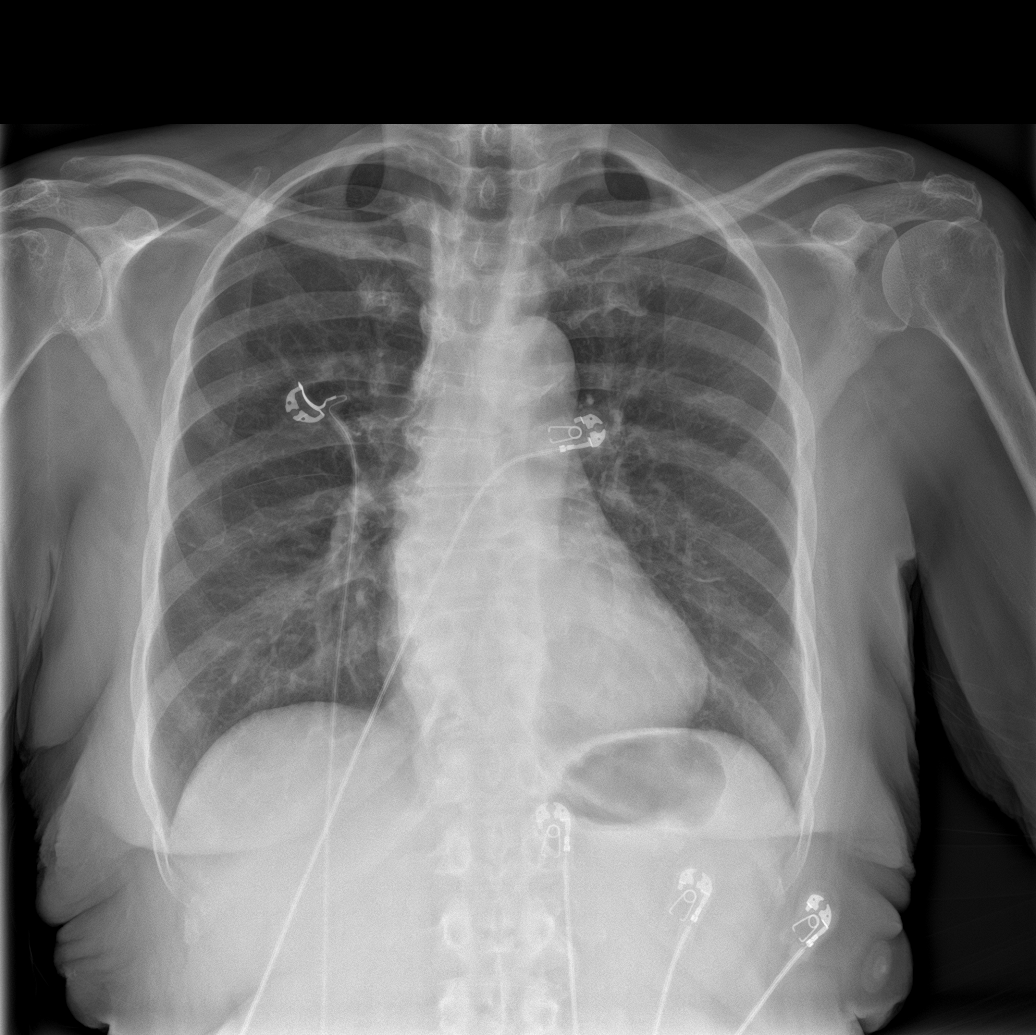

[4 of 4 positions shown; findings below may reference images not displayed]

FINDINGS: Normal bowel gas pattern. Atheromatous calcification. No concerning
mass effect or gas collection.

Normal heart size and mediastinal contours. No acute infiltrate or
edema. No effusion or pneumothorax.

Generalized degenerative endplate spurring.

Artifact from EKG leads.
IMPRESSION: No acute finding in the chest or abdomen

## 2020-07-19 IMAGING — DX DG CHEST 1V
1 series · 1 of 1 positions shown · non-contrast
Comparison: [DATE]

CLINICAL DATA: 64-year-old female with malaise and nausea

EXAM:
CHEST  1 VIEW

[chest ap]
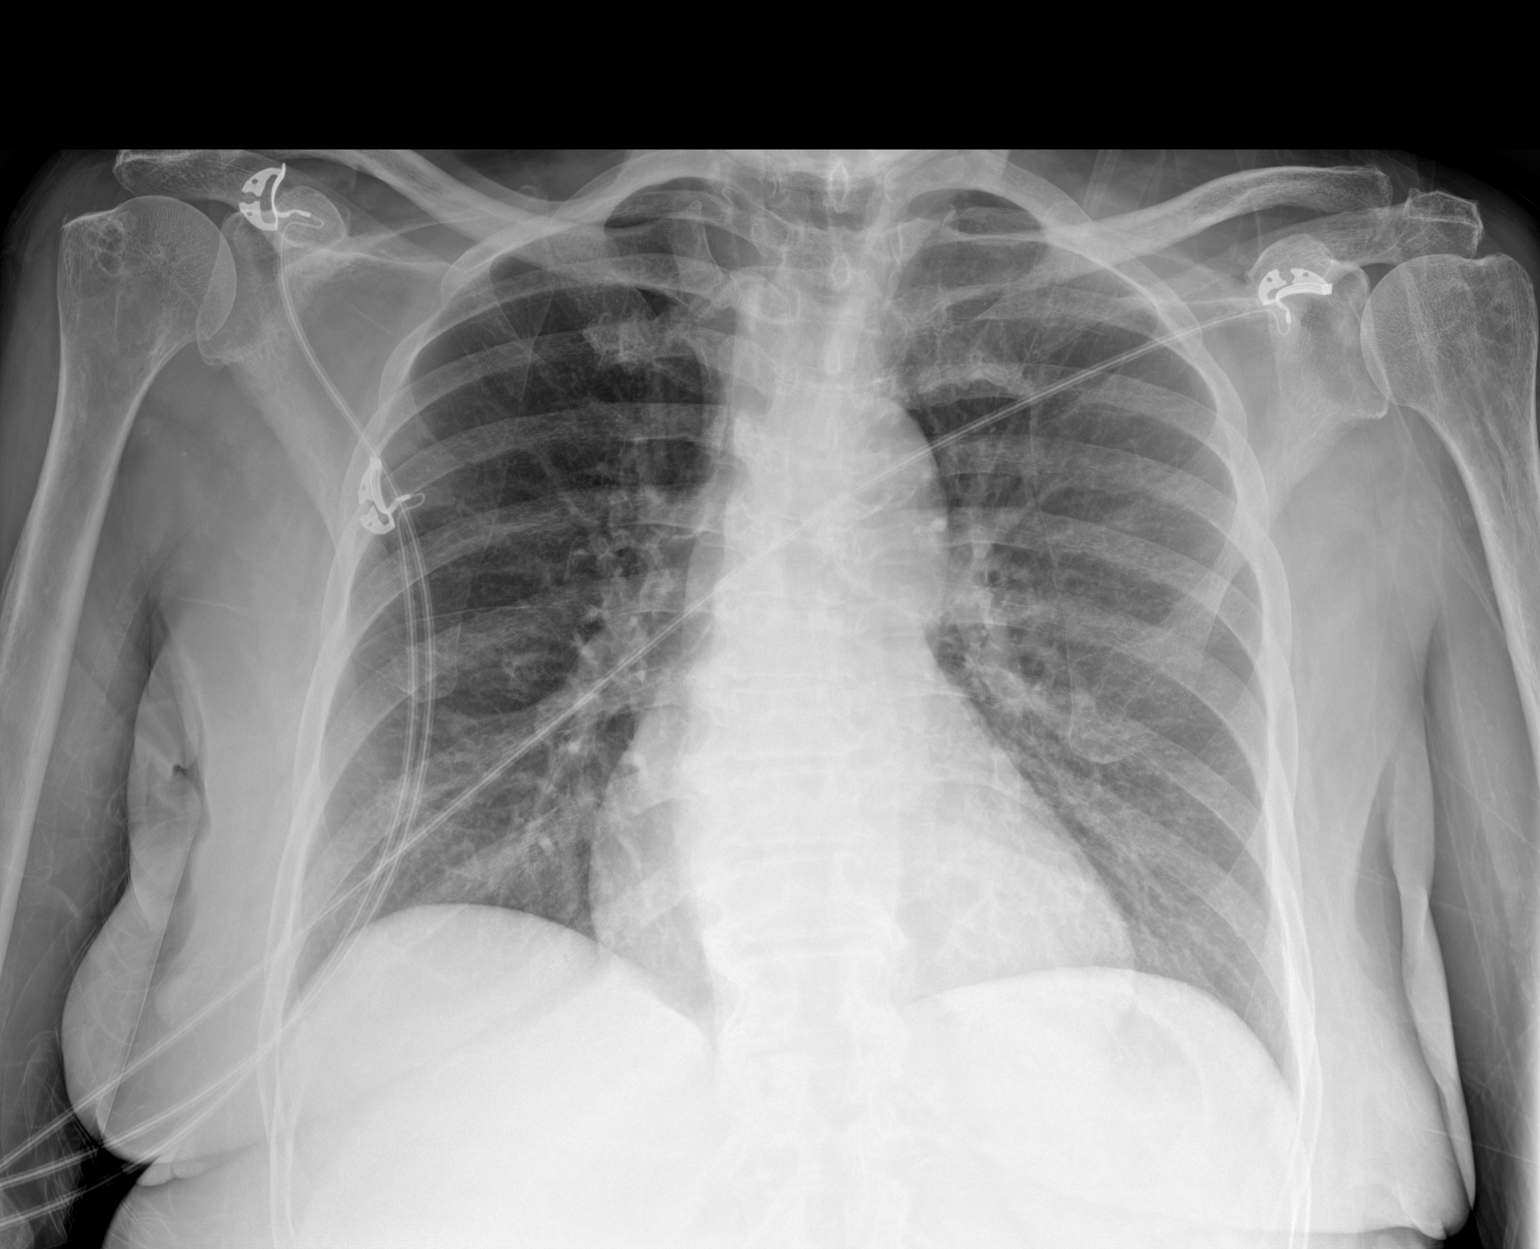

[1 of 1 positions shown; findings below may reference images not displayed]

FINDINGS: Cardiomediastinal silhouette unchanged in size and contour. No
pneumothorax. No pleural effusion. No confluent airspace disease.
Coarsened interstitial markings throughout the lungs, similar to the
comparison.

No displaced fracture
IMPRESSION: Chronic lung changes without evidence of acute cardiopulmonary
disease

## 2020-07-19 MED ORDER — POTASSIUM CHLORIDE 10 MEQ/100ML IV SOLN
10.0000 meq | INTRAVENOUS | Status: DC
  Administered 2020-07-19: 10 meq via INTRAVENOUS
  Filled 2020-07-19: qty 100

## 2020-07-19 MED ORDER — LORAZEPAM 1 MG PO TABS: 1.0000 mg | ORAL_TABLET | ORAL | Status: DC | PRN

## 2020-07-19 MED ORDER — POTASSIUM CHLORIDE CRYS ER 20 MEQ PO TBCR
40.0000 meq | EXTENDED_RELEASE_TABLET | Freq: Two times a day (BID) | ORAL | Status: AC
  Administered 2020-07-19 (×2): 40 meq via ORAL
  Filled 2020-07-19 (×3): qty 2

## 2020-07-19 MED ORDER — POTASSIUM CHLORIDE CRYS ER 20 MEQ PO TBCR
40.0000 meq | EXTENDED_RELEASE_TABLET | Freq: Once | ORAL | Status: AC
  Administered 2020-07-19: 40 meq via ORAL
  Filled 2020-07-19: qty 2

## 2020-07-19 MED ORDER — FOLIC ACID 1 MG PO TABS
1.0000 mg | ORAL_TABLET | Freq: Every day | ORAL | Status: DC
  Filled 2020-07-19: qty 1

## 2020-07-19 MED ORDER — ENOXAPARIN SODIUM 40 MG/0.4ML ~~LOC~~ SOLN
40.0000 mg | SUBCUTANEOUS | Status: DC
  Administered 2020-07-19 – 2020-07-21 (×3): 40 mg via SUBCUTANEOUS
  Filled 2020-07-19 (×3): qty 0.4

## 2020-07-19 MED ORDER — SODIUM CHLORIDE 0.9 % IV SOLN: Freq: Once | INTRAVENOUS | Status: AC

## 2020-07-19 MED ORDER — THIAMINE HCL 100 MG/ML IJ SOLN
Freq: Once | INTRAVENOUS | Status: AC
  Filled 2020-07-19: qty 1000

## 2020-07-19 MED ORDER — HYDRALAZINE HCL 20 MG/ML IJ SOLN
10.0000 mg | Freq: Four times a day (QID) | INTRAMUSCULAR | Status: DC | PRN
  Administered 2020-07-19 – 2020-07-20 (×2): 10 mg via INTRAVENOUS
  Filled 2020-07-19 (×3): qty 1

## 2020-07-19 MED ORDER — SUCRALFATE 1 GM/10ML PO SUSP
1.0000 g | Freq: Three times a day (TID) | ORAL | Status: DC
  Administered 2020-07-19 – 2020-07-21 (×8): 1 g via ORAL
  Filled 2020-07-19 (×11): qty 10

## 2020-07-19 MED ORDER — POLYETHYLENE GLYCOL 3350 17 G PO PACK: 17.0000 g | PACK | Freq: Every day | ORAL | Status: DC | PRN

## 2020-07-19 MED ORDER — METOPROLOL SUCCINATE ER 25 MG PO TB24
12.5000 mg | ORAL_TABLET | Freq: Every day | ORAL | Status: DC
  Administered 2020-07-19: 12.5 mg via ORAL
  Filled 2020-07-19: qty 1
  Filled 2020-07-19: qty 0.5

## 2020-07-19 MED ORDER — MAGNESIUM SULFATE 2 GM/50ML IV SOLN
2.0000 g | Freq: Once | INTRAVENOUS | Status: AC
  Administered 2020-07-19: 2 g via INTRAVENOUS
  Filled 2020-07-19: qty 50

## 2020-07-19 MED ORDER — LORAZEPAM 2 MG/ML IJ SOLN
1.0000 mg | INTRAMUSCULAR | Status: DC | PRN
  Administered 2020-07-19 – 2020-07-20 (×2): 2 mg via INTRAVENOUS
  Filled 2020-07-19 (×2): qty 1

## 2020-07-19 NOTE — ED Provider Notes (Signed)
[facility] Emergency Department Provider Note  ____________________________________________   Event Date/Time   First MD Initiated Contact with Patient 07/19/20 [phone]     (approximate)  I have reviewed the triage vital signs and the nursing notes.   HISTORY  Chief Complaint Abdominal Pain and Nausea    HPI Donna Aguirre is a 65 y.o. female with medical history as listed below which notably includes chronic esophageal issues and the need for previous esophageal dilatation. She presents for general malaise and nausea and vomiting that she said has been going on for the last few days. Occasionally she has pain in her abdomen but not currently. She thinks that most of the abdominal pain comes from throwing up so much. Nothing particular makes the symptoms better or worse and she describes them as severe. In addition to the nausea and vomiting she feels tired  and worn out. She denies fever, sore throat, nasal congestion/runny nose, shortness of breath, cough, and chest pain.        Past Medical History:  Diagnosis Date  . Abdominal pain 11/22/2019  . Arthritis   . Diabetes mellitus without complication ([place])   . Gout   . Hypertension   . Hypokalemia 11/22/2019  . Hypomagnesemia 11/22/2019  . Hyponatremia 11/16/2018    Patient Active Problem List   Diagnosis Date Noted  . Hypokalemia 07/19/2020  . E-coli UTI   . Tachycardia   . Esophageal stricture 11/23/2019  . Esophageal dysphagia   . Esophagitis   . Alcohol use disorder, moderate, dependence ([place]) 11/22/2019  . Elevated troponin 11/22/2019  . Pancytopenia ([place]) 11/22/2019  . HTN (hypertension) 11/22/2019  . History of esophageal stricture on EGD 12/2018 [facility] 11/22/2019  . PUD (peptic ulcer disease), with hx of perforated gastric ulcer 11/22/2019  . Vomiting 11/22/2019  . Alcohol abuse   . Delirium due to another medical condition, acute, mixed level of activity 10/23/2018    Past Surgical  History:  Procedure Laterality Date  . ESOPHAGOGASTRODUODENOSCOPY (EGD) WITH PROPOFOL N/A 11/23/2019   Procedure: ESOPHAGOGASTRODUODENOSCOPY (EGD) WITH PROPOFOL;  Surgeon: [name], MD;  Location: [place];  Service: Gastroenterology;  Laterality: N/A;  . perforated gastric ulcer      Prior to Admission medications   Medication Sig Start Date End Date Taking? Authorizing Provider  folic acid (FOLVITE) 1 MG tablet Take 1 tablet (1 mg total) by mouth daily. 11/25/19   Loletha Grayer, MD  Magnesium Oxide 200 MG TABS Take 1 tablet (200 mg total) by mouth every morning. 11/25/19   Loletha Grayer, MD  metoprolol succinate (TOPROL-XL) 25 MG 24 hr tablet Take 0.5 tablets (12.5 mg total) by mouth at bedtime. 11/25/19   Loletha Grayer, MD  omeprazole (PRILOSEC) 40 MG capsule Take 1 capsule (40 mg total) by mouth 2 (two) times daily before a meal. 01/22/20 04/21/20  Vanga, Tally Due, MD  ondansetron (ZOFRAN) 4 MG tablet Take 1 tablet (4 mg total) by mouth every 8 (eight) hours as needed for nausea or vomiting. 11/25/19   Loletha Grayer, MD  polyethylene glycol (MIRALAX / GLYCOLAX) 17 g packet Take 17 g by mouth daily as needed for moderate constipation. 11/25/19   Loletha Grayer, MD  sucralfate (CARAFATE) 1 GM/10ML suspension Take 10 mLs (1 g total) by mouth 4 (four) times daily -  with meals and at bedtime. 11/25/19   Loletha Grayer, MD  thiamine 100 MG tablet Take 1 tablet (100 mg total) by mouth daily. 11/25/19   [place],  Richard, MD    Allergies Patient has no known allergies.  No family history on file.  Social History Social History   Tobacco Use  . Smoking status: Current Some Day Smoker    Types: Cigarettes  . Smokeless tobacco: Current User    Types: Snuff  Substance Use Topics  . Alcohol use: Yes    Alcohol/week: 7.0 standard drinks    Types: 7 Cans of beer per week    Comment: one 40 oz. states she drinks everytime some one brings her something   . Drug  use: No    Review of Systems Constitutional: No fever/chills Eyes: No visual changes. ENT: No sore throat. Cardiovascular: Denies chest pain. Respiratory: Denies shortness of breath. Gastrointestinal: Nausea and vomiting with intermittent abdominal pain. Genitourinary: Negative for dysuria. Musculoskeletal: Negative for neck pain.  Negative for back pain. Integumentary: Negative for rash. Neurological: Negative for headaches, focal weakness or numbness.   ____________________________________________   PHYSICAL EXAM:  VITAL SIGNS: ED Triage Vitals  Enc Vitals Group     BP 07/19/20 0454 134/77     Pulse Rate 07/19/20 0454 80     Resp 07/19/20 0454 18     Temp 07/19/20 0454 97.7 F (36.5 C)     Temp src --      SpO2 07/19/20 0454 98 %     Weight 07/19/20 0456 65.8 kg (145 lb)     Height 07/19/20 0456 1.651 m (5\' 5" )     Head Circumference --      Peak Flow --      Pain Score 07/19/20 0456 0     Pain Loc --      Pain Edu? --      Excl. in [place]? --     Constitutional: Alert and oriented.  Eyes: Conjunctivae are normal.  Head: Atraumatic. Nose: No congestion/rhinnorhea. Mouth/Throat: Patient is wearing a mask. Neck: No stridor.  No meningeal signs.   Cardiovascular: Normal rate, regular rhythm. Good peripheral circulation. Respiratory: Normal respiratory effort.  No retractions. Gastrointestinal: Soft and nontender. No distention. Patient reports some nausea but is not actively vomiting. Musculoskeletal: No lower extremity tenderness nor edema. No gross deformities of extremities. Neurologic:  Normal speech and language. No gross focal neurologic deficits are appreciated.  Skin:  Skin is warm, dry and intact.   ____________________________________________   LABS (all labs ordered are listed, but only abnormal results are displayed)  Labs Reviewed  COMPREHENSIVE METABOLIC PANEL - Abnormal; Notable for the following components:      Result Value   Sodium 132 (*)     Potassium 2.4 (*)    Chloride 91 (*)    Glucose, Bld 116 (*)    BUN <5 (*)    Calcium 8.7 (*)    Total Protein 8.5 (*)    All other components within normal limits  CBC WITH DIFFERENTIAL/PLATELET - Abnormal; Notable for the following components:   WBC 3.8 (*)    Hemoglobin 11.8 (*)    HCT 34.7 (*)    MCV 79.2 (*)    RDW 16.7 (*)    All other components within normal limits  PROTIME-INR - Abnormal; Notable for the following components:   Prothrombin Time 16.1 (*)    INR 1.3 (*)    All other components within normal limits  MAGNESIUM - Abnormal; Notable for the following components:   Magnesium 1.4 (*)    All other components within normal limits  CULTURE, BLOOD (SINGLE)  URINE CULTURE  RESP  PANEL BY RT-PCR (FLU A&B, COVID) ARPGX2  LACTIC ACID, PLASMA  LIPASE, BLOOD  URINALYSIS, COMPLETE (UACMP) WITH MICROSCOPIC  MAGNESIUM  PHOSPHORUS  CBC  POC SARS CORONAVIRUS 2 AG -  ED   ____________________________________________  EKG  ED ECG REPORT I, Hinda Kehr, the attending physician, personally viewed and interpreted this ECG.  Date: 07/19/2020 EKG Time: 5:05 AM Rate: 74 Rhythm: normal sinus rhythm QRS Axis: normal Intervals: normal ST/T Wave abnormalities: Non-specific ST segment / T-wave changes, but no clear evidence of acute ischemia. Narrative Interpretation: no definitive evidence of acute ischemia; does not meet STEMI criteria.   ____________________________________________  RADIOLOGY I, Hinda Kehr, personally viewed and evaluated these images (plain radiographs) as part of my medical decision making, as well as reviewing the written report by the radiologist.  ED MD interpretation: No acute abnormality identified on radiographs of the chest and abdomen.  Official radiology report(s): DG Abdomen Acute W/Chest  Result Date: 07/19/2020 CLINICAL DATA:  Abdominal pain and vomiting EXAM: DG ABDOMEN ACUTE WITH 1 VIEW CHEST COMPARISON:  Abdominal CT 11/22/2019  FINDINGS: Normal bowel gas pattern. Atheromatous calcification. No concerning mass effect or gas collection. Normal heart size and mediastinal contours. No acute infiltrate or edema. No effusion or pneumothorax. Generalized degenerative endplate spurring. Artifact from EKG leads. IMPRESSION: No acute finding in the chest or abdomen Electronically Signed   By: Monte Fantasia M.D.   On: 07/19/2020 05:54    ____________________________________________   PROCEDURES   Procedure(s) performed (including Critical Care):  Procedures   ____________________________________________   INITIAL IMPRESSION / MDM / [place] / ED COURSE  As part of my medical decision making, I reviewed the following data within the [place] notes reviewed and incorporated, Labs reviewed , EKG interpreted , Old chart reviewed, Radiograph reviewed , Discussed with admitting physician  (Dr. Francine Graven) and Notes from prior ED visits   Differential diagnosis includes, but is not limited to, nonspecific viral illness, gastritis, PUD, nonspecific esophagitis, SBO/ileus, acute infection.  Vital signs are stable other than the patient being hypertensive. She reports that she was told that she had hypertension before and that was taken off her medication and no longer has hypertension. She has no headache or visual changes. She has had persistent vomiting for several days and her lab work is notable for a profoundly decreased potassium of 2.4. Of note she has EKG changes with prolonged QTC compared to old EKGs which is concerning given the profound electrolyte imbalance.  Patient will require admission for potassium repletion. She also might benefit from GI consult given her history of GI issues. I am not concerned she has an SBO or ileus; she has no abdominal tenderness to palpation or distention, and I personally reviewed the patient's imaging and agree with the radiologist's interpretation that  there is no evidence of air-fluid levels or dilatation to suggest an obstruction.  Holding off on antiemetics given the QTC prolongation. I ordered potassium 40 mEq by mouth, potassium 10 mEq x 6 doses by IV, and magnesium 2 g IV to assist with potassium repletion. The patient is not actively vomiting. I will consult the hospitalist for admission.       Clinical Course as of 07/19/20 2585  Nancy Fetter Jul 19, 2020  0607 Lactic Acid, Venous: 1.6 [CF]  0626 SARS Coronavirus 2 Ag: NEGATIVE [CF]  2778 Discussed case by phone with the hospitalist and she will admit for further repletion of hypokalemia and possible GI consultation. [CF]  Clinical Course User Index [CF] Hinda Kehr, MD     ____________________________________________  FINAL CLINICAL IMPRESSION(S) / ED DIAGNOSES  Final diagnoses:  Intractable nausea and vomiting  Generalized abdominal pain  Hypokalemia  Prolonged Q-T interval on ECG  Hypertension, unspecified type     MEDICATIONS GIVEN DURING THIS VISIT:  Medications  magnesium sulfate IVPB 2 g 50 mL (2 g Intravenous New Bag/Given 07/19/20 0826)  metoprolol succinate (TOPROL-XL) 24 hr tablet 12.5 mg (has no administration in time range)  polyethylene glycol (MIRALAX / GLYCOLAX) packet 17 g (has no administration in time range)  sucralfate (CARAFATE) 1 GM/10ML suspension 1 g (1 g Oral Given 07/19/20 0835)  enoxaparin (LOVENOX) injection 40 mg (40 mg Subcutaneous Given 07/19/20 0834)  potassium chloride SA (KLOR-CON) CR tablet 40 mEq (has no administration in time range)  hydrALAZINE (APRESOLINE) injection 10 mg (has no administration in time range)  sodium chloride 0.9 % 1,000 mL with thiamine 409 mg, folic acid 1 mg, multivitamins adult 10 mL, potassium chloride 40 mEq infusion (has no administration in time range)  LORazepam (ATIVAN) tablet 1-4 mg (has no administration in time range)    Or  LORazepam (ATIVAN) injection 1-4 mg (has no administration in time range)   potassium chloride SA (KLOR-CON) CR tablet 40 mEq (40 mEq Oral Given 07/19/20 0657)  0.9 %  sodium chloride infusion ( Intravenous New Bag/Given 07/19/20 7353)     ED Discharge Orders    None      *Please note:  FLECIA SHUTTER was evaluated in Emergency Department on 07/19/2020 for the symptoms described in the history of present illness. She was evaluated in the context of the global COVID-19 pandemic, which necessitated consideration that the patient might be at risk for infection with the SARS-CoV-2 virus that causes COVID-19. Institutional protocols and algorithms that pertain to the evaluation of patients at risk for COVID-19 are in a state of rapid change based on information released by regulatory bodies including the CDC and federal and state organizations. These policies and algorithms were followed during the patient's care in the ED.  Some ED evaluations and interventions may be delayed as a result of limited staffing during and after the pandemic.*  Note:  This document was prepared using Dragon voice recognition software and may include unintentional dictation errors.   Hinda Kehr, MD 07/19/20 [phone]

## 2020-07-19 NOTE — ED Notes (Addendum)
Repositioned in bed with blanket.

## 2020-07-19 NOTE — ED Notes (Signed)
Pt sitting up in bed moaning and rocking slightly. Pt denies pain. Pt continues to spit clear spit into emesis bag. Provided with water and tea. No further needs stated at this time.

## 2020-07-19 NOTE — ED Notes (Signed)
Spoke with Pharmacy regarding if Mag could be ran with thiamine bag , they advised yes

## 2020-07-19 NOTE — ED Notes (Signed)
Dr Agbata at bedside. 

## 2020-07-19 NOTE — H&P (Signed)
History and Physical    Donna Aguirre GUY:[id number] DOB: 01-13-1956 DOA: 07/19/2020  PCP: Patient, No Pcp Per   Patient coming from: Home  I have personally briefly reviewed patient's old medical records in [place]  Chief Complaint: Abdominal pain                                Nausea/vomiting  HPI: Donna Aguirre is a 65 y.o. female with medical history significant for alcohol and nicotine dependence, hypertension, gout who presents to the ER for evaluation of abdominal pain which is mostly in the periumbilical area.  She rated her pain a 6 x 10 in intensity at its worst and the pain was nonradiating was associated with nausea and multiple episodes of emesis.  By the time she arrived to the ER her abdominal pain had resolved, she remained nauseous but had no further episodes of emesis.  She complains of feeling very weak and tired but denies having any fever, no sore throat, no nasal congestion, no cough, no shortness of breath, no myalgias.  She denies having any loss of consciousness or any falls.  She admits to continued alcohol use and denies having symptoms of alcohol withdrawal when she does not drink.  She denies having any dysphagia with solids or liquids. She denies having any chest pain, no orthopnea, no paroxysmal nocturnal dyspnea, no leg swelling, no headache, no blurred vision, no dizziness, no lightheadedness. Labs show sodium 132, potassium 2.4, chloride 91, bicarb 28, glucose 116, BUN 5, creatinine 0.5, calcium 8.7, magnesium 1.4, alkaline phosphatase 65, albumin 3.7, AST 25, ALT 10, total protein 8.5, lactic acid 1.6, white count 3.8, hemoglobin 11.8, hematocrit 34.7, MCV 79.2, RDW 16.7, platelet count 183, PT 16.1, INR 1.3, Patient's SARS coronavirus 2 POC test is positive Urine analysis is sterile X-ray of the chest and abdomen reviewed by me shows no acute findings. Twelve-lead EKG shows sinus rhythm with T wave inversions anterior lateral leads   ED Course:  Patient is a 65 year old African-American female with a history of alcohol abuse and history of esophageal stricture status post dilatation who presented to the ER for evaluation of abdominal pain, nausea and vomiting as well as generalized weakness.  Labs revealed hypokalemia with potassium of 2.4.  Patient's COVID-19 point-of-care testing is also positive.  She has EKG changes which showed T wave inversions in the anterior lateral leads.  Patient will be referred to observation status for further evaluation.  Review of Systems: As per HPI otherwise all other systems reviewed and negative.    Past Medical History:  Diagnosis Date  . Abdominal pain 11/22/2019  . Arthritis   . Diabetes mellitus without complication ([place])   . Gout   . Hypertension   . Hypokalemia 11/22/2019  . Hypomagnesemia 11/22/2019  . Hyponatremia 11/16/2018    Past Surgical History:  Procedure Laterality Date  . ESOPHAGOGASTRODUODENOSCOPY (EGD) WITH PROPOFOL N/A 11/23/2019   Procedure: ESOPHAGOGASTRODUODENOSCOPY (EGD) WITH PROPOFOL;  Surgeon: [name], MD;  Location: [place];  Service: Gastroenterology;  Laterality: N/A;  . perforated gastric ulcer       reports that she has been smoking cigarettes. Her smokeless tobacco use includes snuff. She reports current alcohol use of about 7.0 standard drinks of alcohol per week. She reports that she does not use drugs.  No Known Allergies  Family History  Problem Relation Age of Onset  . Hypertension Mother   .  Hypertension Father       Prior to Admission medications   Medication Sig Start Date End Date Taking? Authorizing Provider  folic acid (FOLVITE) 1 MG tablet Take 1 tablet (1 mg total) by mouth daily. 11/25/19   Loletha Grayer, MD  Magnesium Oxide 200 MG TABS Take 1 tablet (200 mg total) by mouth every morning. 11/25/19   Loletha Grayer, MD  metoprolol succinate (TOPROL-XL) 25 MG 24 hr tablet Take 0.5 tablets (12.5 mg total) by mouth at bedtime.  11/25/19   Loletha Grayer, MD  omeprazole (PRILOSEC) 40 MG capsule Take 1 capsule (40 mg total) by mouth 2 (two) times daily before a meal. 01/22/20 04/21/20  Vanga, Tally Due, MD  ondansetron (ZOFRAN) 4 MG tablet Take 1 tablet (4 mg total) by mouth every 8 (eight) hours as needed for nausea or vomiting. 11/25/19   Loletha Grayer, MD  polyethylene glycol (MIRALAX / GLYCOLAX) 17 g packet Take 17 g by mouth daily as needed for moderate constipation. 11/25/19   Loletha Grayer, MD  sucralfate (CARAFATE) 1 GM/10ML suspension Take 10 mLs (1 g total) by mouth 4 (four) times daily -  with meals and at bedtime. 11/25/19   Loletha Grayer, MD  thiamine 100 MG tablet Take 1 tablet (100 mg total) by mouth daily. 11/25/19   Loletha Grayer, MD    Physical Exam: Vitals:   07/19/20 0800 07/19/20 0830 07/19/20 0841 07/19/20 0913  BP: (!) 204/101 (!) 172/108 (!) 211/102   Pulse: 78 86 86   Resp: 18 18    Temp:    98.8 F (37.1 C)  TempSrc:    Oral  SpO2: 100% 99%    Weight:      Height:         Vitals:   07/19/20 0800 07/19/20 0830 07/19/20 0841 07/19/20 0913  BP: (!) 204/101 (!) 172/108 (!) 211/102   Pulse: 78 86 86   Resp: 18 18    Temp:    98.8 F (37.1 C)  TempSrc:    Oral  SpO2: 100% 99%    Weight:      Height:          Constitutional: Alert and oriented x 3 . Not in any apparent distress.  Chronically ill-appearing HEENT:      Head: Normocephalic and atraumatic.         Eyes: PERLA, EOMI, Conjunctivae are normal. Sclera is non-icteric.       Mouth/Throat: Mucous membranes are moist.       Neck: Supple with no signs of meningismus. Cardiovascular: Regular rate and rhythm. No murmurs, gallops, or rubs. 2+ symmetrical distal pulses are present . No JVD. No LE edema Respiratory: Respiratory effort normal .Lungs sounds clear bilaterally. No wheezes, crackles, or rhonchi.  Gastrointestinal: Soft, non tender, and non distended with positive bowel sounds.  Genitourinary: No CVA  tenderness. Musculoskeletal: Nontender with normal range of motion in all extremities No cyanosis, or erythema of extremities. Neurologic:  Face is symmetric. Moving all extremities. No gross focal neurologic deficits  Skin: Skin is warm, dry.  No rash or ulcers Psychiatric: Mood and affect are normal   Labs on Admission: I have personally reviewed following labs and imaging studies  CBC: Recent Labs  Lab 07/19/20 0504  WBC 3.8*  NEUTROABS 2.4  HGB 11.8*  HCT 34.7*  MCV 79.2*  PLT 751   Basic Metabolic Panel: Recent Labs  Lab 07/19/20 0504  NA 132*  K 2.4*  CL 91*  CO2 28  GLUCOSE 116*  BUN <5*  CREATININE 0.52  CALCIUM 8.7*  MG 1.4*   GFR: Estimated Creatinine Clearance: 63.9 mL/min (by C-G formula based on SCr of 0.52 mg/dL). Liver Function Tests: Recent Labs  Lab 07/19/20 0504  AST 25  ALT 10  ALKPHOS 65  BILITOT 1.0  PROT 8.5*  ALBUMIN 3.7   Recent Labs  Lab 07/19/20 0504  LIPASE 20   No results for input(s): AMMONIA in the last 168 hours. Coagulation Profile: Recent Labs  Lab 07/19/20 0504  INR 1.3*   Cardiac Enzymes: No results for input(s): CKTOTAL, CKMB, CKMBINDEX, TROPONINI in the last 168 hours. BNP (last 3 results) No results for input(s): PROBNP in the last 8760 hours. HbA1C: No results for input(s): HGBA1C in the last 72 hours. CBG: No results for input(s): GLUCAP in the last 168 hours. Lipid Profile: No results for input(s): CHOL, HDL, LDLCALC, TRIG, CHOLHDL, LDLDIRECT in the last 72 hours. Thyroid Function Tests: No results for input(s): TSH, T4TOTAL, FREET4, T3FREE, THYROIDAB in the last 72 hours. Anemia Panel: No results for input(s): VITAMINB12, FOLATE, FERRITIN, TIBC, IRON, RETICCTPCT in the last 72 hours. Urine analysis:    Component Value Date/Time   COLORURINE YELLOW (A) 07/19/2020 0802   APPEARANCEUR CLEAR (A) 07/19/2020 0802   LABSPEC 1.008 07/19/2020 0802   PHURINE 7.0 07/19/2020 0802   GLUCOSEU NEGATIVE  07/19/2020 0802   HGBUR NEGATIVE 07/19/2020 0802   BILIRUBINUR NEGATIVE 07/19/2020 0802   KETONESUR 5 (A) 07/19/2020 0802   PROTEINUR NEGATIVE 07/19/2020 0802   NITRITE NEGATIVE 07/19/2020 0802   LEUKOCYTESUR NEGATIVE 07/19/2020 0802    Radiological Exams on Admission: DG Chest 1 View  Result Date: 07/19/2020 CLINICAL DATA:  65 year old female with malaise and nausea EXAM: CHEST  1 VIEW COMPARISON:  03/04/2019 FINDINGS: Cardiomediastinal silhouette unchanged in size and contour. No pneumothorax. No pleural effusion. No confluent airspace disease. Coarsened interstitial markings throughout the lungs, similar to the comparison. No displaced fracture IMPRESSION: Chronic lung changes without evidence of acute cardiopulmonary disease Electronically Signed   By: Corrie Mckusick D.O.   On: 07/19/2020 09:43   DG Abdomen Acute W/Chest  Result Date: 07/19/2020 CLINICAL DATA:  Abdominal pain and vomiting EXAM: DG ABDOMEN ACUTE WITH 1 VIEW CHEST COMPARISON:  Abdominal CT 11/22/2019 FINDINGS: Normal bowel gas pattern. Atheromatous calcification. No concerning mass effect or gas collection. Normal heart size and mediastinal contours. No acute infiltrate or edema. No effusion or pneumothorax. Generalized degenerative endplate spurring. Artifact from EKG leads. IMPRESSION: No acute finding in the chest or abdomen Electronically Signed   By: Monte Fantasia M.D.   On: 07/19/2020 05:54     Assessment/Plan Principal Problem:   Hypokalemia Active Problems:   HTN (hypertension)   Hypomagnesemia   Alcohol abuse   COVID-19 virus detected   Nicotine dependence    Hypokalemia/hypomagnesemia Most likely related to GI losses from nausea and vomiting Patient noted to have EKG changes with T wave inversions in the anterolateral leads We will place patient on a cardiac monitor for this 24 hours We will supplement potassium and magnesium   Hypertension Uncontrolled Continue metoprolol Place patient on IV  hydralazine for systolic blood pressure greater than 161mmHg   History of alcohol dependence/abuse Patient admits to continued alcohol use but denies having symptoms of alcohol withdrawal when she does not drink We will place patient on lorazepam and administer for CIWA score of 8 or greater Place patient on thiamine, MVI and folic acid.    Nicotine dependence Smoking  cessation was discussed with patient in detail She declines a nicotine transdermal patch at this time    COVID-19 virus detected  Patient has a positive COVID-19 virus point-of-care test but is asymptomatic Vaccination status is unknown at this time We will place patient on isolation Ideally patient will benefit from administration of monoclonal antibody but this is currently unavailable for inpatients   DVT prophylaxis: Lovenox Code Status: full code Family Communication: Greater than 50% of time was spent discussing patient's condition and plan of care with her at the bedside.  All questions and concerns have been addressed.  She verbalizes understanding and agrees with the plan Disposition Plan: Back to previous home environment Consults called: none Status: Observation    Jahbari Repinski MD [facility]     07/19/2020, 9:51 AM

## 2020-07-19 NOTE — ED Notes (Signed)
Spoke with pharmacy. They are currently preparing IVF with thiamine, MTV, potassium etc and will send when ready.

## 2020-07-19 NOTE — ED Notes (Addendum)
Error, note entered in wrong pt chart.

## 2020-07-19 NOTE — ED Notes (Signed)
Messaged Dr Francine Graven re: BP trends. Pt denies HA. Pt states drank "1 or 2 beers" yesterday and that was last drink. Unable to provide specific amount of daily alcohol intake when questioned. Pt c/o "my stomach" but denies pain.

## 2020-07-19 NOTE — ED Triage Notes (Signed)
Pt arrives to ED from home via [facility] with c/c of abdominal pain and nausea. EMS reports transport vitals of p100, 195/100, O2 100% on room air. Upon arrival, pt A&Ox4, NAD. Dr Karma Greaser at patient bedside.

## 2020-07-19 NOTE — Social Work (Addendum)
TOC CM/SW made several attempts to reach patinet at telephone number listed [phone]. Not able to reach pt.   5:30 Patient sleeping.

## 2020-07-20 DIAGNOSIS — F17210 Nicotine dependence, cigarettes, uncomplicated: Secondary | ICD-10-CM | POA: Diagnosis present

## 2020-07-20 DIAGNOSIS — E119 Type 2 diabetes mellitus without complications: Secondary | ICD-10-CM | POA: Diagnosis present

## 2020-07-20 DIAGNOSIS — E876 Hypokalemia: Secondary | ICD-10-CM | POA: Diagnosis not present

## 2020-07-20 DIAGNOSIS — U071 COVID-19: Secondary | ICD-10-CM | POA: Diagnosis not present

## 2020-07-20 DIAGNOSIS — R400 Somnolence: Secondary | ICD-10-CM | POA: Diagnosis present

## 2020-07-20 DIAGNOSIS — M109 Gout, unspecified: Secondary | ICD-10-CM | POA: Diagnosis present

## 2020-07-20 DIAGNOSIS — A0839 Other viral enteritis: Secondary | ICD-10-CM | POA: Diagnosis present

## 2020-07-20 DIAGNOSIS — I10 Essential (primary) hypertension: Secondary | ICD-10-CM | POA: Diagnosis present

## 2020-07-20 DIAGNOSIS — Z79899 Other long term (current) drug therapy: Secondary | ICD-10-CM | POA: Diagnosis not present

## 2020-07-20 DIAGNOSIS — F1020 Alcohol dependence, uncomplicated: Secondary | ICD-10-CM | POA: Diagnosis present

## 2020-07-20 LAB — CBC WITH DIFFERENTIAL/PLATELET
Abs Immature Granulocytes: 0.01 10*3/uL (ref 0.00–0.07)
Basophils Absolute: 0 10*3/uL (ref 0.0–0.1)
Basophils Relative: 1 %
Eosinophils Absolute: 0 10*3/uL (ref 0.0–0.5)
Eosinophils Relative: 0 %
HCT: 39.1 % (ref 36.0–46.0)
Hemoglobin: 13 g/dL (ref 12.0–15.0)
Immature Granulocytes: 0 %
Lymphocytes Relative: 20 %
Lymphs Abs: 0.8 10*3/uL (ref 0.7–4.0)
MCH: 26.7 pg (ref 26.0–34.0)
MCHC: 33.2 g/dL (ref 30.0–36.0)
MCV: 80.3 fL (ref 80.0–100.0)
Monocytes Absolute: 0.4 10*3/uL (ref 0.1–1.0)
Monocytes Relative: 11 %
Neutro Abs: 2.6 10*3/uL (ref 1.7–7.7)
Neutrophils Relative %: 68 %
Platelets: 209 10*3/uL (ref 150–400)
RBC: 4.87 MIL/uL (ref 3.87–5.11)
RDW: 17.4 % — ABNORMAL HIGH (ref 11.5–15.5)
WBC: 3.8 10*3/uL — ABNORMAL LOW (ref 4.0–10.5)
nRBC: 0 % (ref 0.0–0.2)

## 2020-07-20 LAB — COMPREHENSIVE METABOLIC PANEL
ALT: 8 U/L (ref 0–44)
AST: 19 U/L (ref 15–41)
Albumin: 3.5 g/dL (ref 3.5–5.0)
Alkaline Phosphatase: 62 U/L (ref 38–126)
Anion gap: 10 (ref 5–15)
BUN: <5 mg/dL — ABNORMAL LOW (ref 8–23)
CO2: 24 mmol/L (ref 22–32)
Calcium: 9.1 mg/dL (ref 8.9–10.3)
Chloride: 100 mmol/L (ref 98–111)
Creatinine, Ser: 0.41 mg/dL — ABNORMAL LOW (ref 0.44–1.00)
GFR, Estimated: >60 mL/min (ref >60)
Glucose, Bld: 111 mg/dL — ABNORMAL HIGH (ref 70–99)
Potassium: 4.2 mmol/L (ref 3.5–5.1)
Sodium: 134 mmol/L — ABNORMAL LOW (ref 135–145)
Total Bilirubin: 1.1 mg/dL (ref 0.3–1.2)
Total Protein: 8.6 g/dL — ABNORMAL HIGH (ref 6.5–8.1)

## 2020-07-20 LAB — URINE CULTURE

## 2020-07-20 LAB — FERRITIN: Ferritin: 19 ng/mL (ref 11–307)

## 2020-07-20 LAB — C-REACTIVE PROTEIN: CRP: 0.7 mg/dL (ref <1.0)

## 2020-07-20 LAB — MAGNESIUM: Magnesium: 1.7 mg/dL (ref 1.7–2.4)

## 2020-07-20 LAB — PHOSPHORUS: Phosphorus: 2.4 mg/dL — ABNORMAL LOW (ref 2.5–4.6)

## 2020-07-20 MED ORDER — SODIUM CHLORIDE 0.9 % IV BOLUS
1000.0000 mL | Freq: Once | INTRAVENOUS | Status: AC
  Administered 2020-07-20: 1000 mL via INTRAVENOUS

## 2020-07-20 MED ORDER — POTASSIUM & SODIUM PHOSPHATES 280-160-250 MG PO PACK
2.0000 | PACK | Freq: Three times a day (TID) | ORAL | Status: AC
  Administered 2020-07-20 (×3): 2 via ORAL
  Filled 2020-07-20 (×3): qty 2

## 2020-07-20 MED ORDER — LISINOPRIL 20 MG PO TABS
20.0000 mg | ORAL_TABLET | Freq: Every day | ORAL | Status: DC
  Administered 2020-07-20 – 2020-07-21 (×2): 20 mg via ORAL
  Filled 2020-07-20 (×2): qty 1

## 2020-07-20 MED ORDER — METOPROLOL SUCCINATE ER 25 MG PO TB24
25.0000 mg | ORAL_TABLET | Freq: Every day | ORAL | Status: DC
  Administered 2020-07-20 – 2020-07-21 (×2): 25 mg via ORAL
  Filled 2020-07-20 (×2): qty 1

## 2020-07-20 MED ORDER — HYDRALAZINE HCL 20 MG/ML IJ SOLN
10.0000 mg | INTRAMUSCULAR | Status: DC | PRN
  Administered 2020-07-20 (×2): 10 mg via INTRAVENOUS
  Filled 2020-07-20: qty 1

## 2020-07-20 NOTE — Progress Notes (Signed)
   07/20/20 1122  Assess: MEWS Score  Temp 98.3 F (36.8 C)  BP (!) 84/49  Resp 20  Level of Consciousness Alert  SpO2 100 %  O2 Device Room Air  Assess: MEWS Score  MEWS Temp 0  MEWS Systolic 1  MEWS Pulse 1  MEWS RR 0  MEWS LOC 0  MEWS Score 2  MEWS Score Color Yellow  Assess: if the MEWS score is Yellow or Red  Were vital signs taken at a resting state? Yes  Focused Assessment No change from prior assessment  Early Detection of Sepsis Score *See Row Information* Low  MEWS guidelines implemented *See Row Information* Yes  Take Vital Signs  Increase Vital Sign Frequency  Yellow: Q 2hr X 2 then Q 4hr X 2, if remains yellow, continue Q 4hrs  Escalate  MEWS: Escalate Yellow: discuss with charge nurse/RN and consider discussing with provider and RRT  Notify: Charge Nurse/RN  Name of Charge Nurse/RN Notified Duncan, RN  Date Charge Nurse/RN Notified 07/20/20  Time Charge Nurse/RN Notified 1129  Notify: Provider  Provider Name/Title Billie Ruddy  Date Provider Notified 07/20/20  Time Provider Notified 1126  Notification Type Page  Notification Reason Change in status  Provider response See new orders  Date of Provider Response 07/20/20  Time of Provider Response 1130  Pt BP is low at this time, gave metoprolol and lisinopril this am for elevated BP and tachycardia. Pt received ativan during night shift. At this time, she is sleeping. She wakens on command and no distress noted at this time. MD Billie Ruddy made aware of hypotension. Ordered NS bolus. Awaiting IV team to place IV since this writer not able to find a vein to stick and her current IV does not work. Will start bolus when IV placed.

## 2020-07-20 NOTE — Progress Notes (Signed)
PROGRESS NOTE    RIN GORTON  SWN:[id number] DOB: 10-01-1955 DOA: 07/19/2020 PCP: Patient, No Pcp Per    Assessment & Plan:   Principal Problem:   Hypokalemia Active Problems:   HTN (hypertension)   Hypomagnesemia   Alcohol abuse   COVID-19 virus detected   Nicotine dependence   Gastroenteritis due to COVID-19 virus   KATLYNE NISHIDA is a 65 y.o. female with medical history significant for alcohol and nicotine dependence, hypertension, gout who presents to the ER for evaluation of abdominal pain which is mostly in the periumbilical area.  She rated her pain a 6 x 10 in intensity at its worst and the pain was nonradiating was associated with nausea and multiple episodes of emesis.  By the time she arrived to the ER her abdominal pain had resolved, she remained nauseous but had no further episodes of emesis.  She complains of feeling very weak and tired but denies having any fever, no sore throat, no nasal congestion, no cough, no shortness of breath, no myalgias.  She denies having any loss of consciousness or any falls.  She admits to continued alcohol use and denies having symptoms of alcohol withdrawal when she does not drink.   COVID-19 gastroenteritis  --No respiratory symptoms.  No hypoxia. --No need to treat. --supportive care. --another liter of NS today  Somnolence likely due to ativan --avoid excessive sedatives  Hypokalemia/hypomagnesemia Most likely related to GI losses from nausea and vomiting --Monitor and replete PRN  Hypertension --BP very labile --increase Toprol to 25 mg daily (new) --Start Lisinopril 20 mg daily --IV hydralazine PRN  History of alcohol dependence/abuse Patient admits to continued alcohol use but denies having symptoms of alcohol withdrawal when she does not drink --thiamine, MVI and folic acid. --CIWA, but avoid excess ativan  Nicotine dependence Smoking cessation was discussed with patient in detail She declines a nicotine  transdermal patch at this time   DVT prophylaxis: Lovenox SQ Code Status: Full code  Family Communication:  Level of care: Med-Surg Dispo:   The patient is from: home Anticipated d/c is to: home Anticipated d/c date is: tomorrow Patient currently is not medically ready to d/c due to: too somnolent, need to ensure proper oral intake prior to discharge.   Subjective and Interval History:  Pt was very somnolent all day today, likely due to ativan given overnight.  No more N/V.  No noted diarrhea.   Objective: Vitals:   07/20/20 1122 07/20/20 1322 07/20/20 1507 07/20/20 1522  BP: (!) 84/49 (!) 106/53 (!) 157/77 (!) 164/87  Pulse: (!) 108 91 99 99  Resp: 20 20 16 18   Temp: 98.3 F (36.8 C) 98 F (36.7 C) 98.2 F (36.8 C) 98 F (36.7 C)  TempSrc: Oral Oral  Oral  SpO2: 100% 100% 100% 100%  Weight:      Height:        Intake/Output Summary (Last 24 hours) at 07/20/2020 1911 Last data filed at 07/20/2020 1509 Gross per 24 hour  Intake 1001.21 ml  Output 350 ml  Net 651.21 ml   Filed Weights   07/19/20 0456  Weight: 65.8 kg    Examination:   Constitutional: NAD, difficult to awake CV: No cyanosis.   RESP: normal respiratory effort, on RA Extremities: No effusions, edema in BLE SKIN: warm, dry   Data Reviewed: I have personally reviewed following labs and imaging studies  CBC: Recent Labs  Lab 07/19/20 0504 07/19/20 1240 07/20/20 0554  WBC 3.8* 4.2 3.8*  NEUTROABS 2.4  --  2.6  HGB 11.8* 12.5 13.0  HCT 34.7* 37.3 39.1  MCV 79.2* 79.9* 80.3  PLT 183 191 976   Basic Metabolic Panel: Recent Labs  Lab 07/19/20 0504 07/19/20 1318 07/20/20 0554  NA 132*  --  134*  K 2.4*  --  4.2  CL 91*  --  100  CO2 28  --  24  GLUCOSE 116*  --  111*  BUN <5*  --  <5*  CREATININE 0.52  --  0.41*  CALCIUM 8.7*  --  9.1  MG 1.4* 2.4 1.7  PHOS  --  2.6 2.4*   GFR: Estimated Creatinine Clearance: 63.9 mL/min (A) (by C-G formula based on SCr of 0.41 mg/dL  (L)). Liver Function Tests: Recent Labs  Lab 07/19/20 0504 07/20/20 0554  AST 25 19  ALT 10 8  ALKPHOS 65 62  BILITOT 1.0 1.1  PROT 8.5* 8.6*  ALBUMIN 3.7 3.5   Recent Labs  Lab 07/19/20 0504  LIPASE 20   No results for input(s): AMMONIA in the last 168 hours. Coagulation Profile: Recent Labs  Lab 07/19/20 0504  INR 1.3*   Cardiac Enzymes: No results for input(s): CKTOTAL, CKMB, CKMBINDEX, TROPONINI in the last 168 hours. BNP (last 3 results) No results for input(s): PROBNP in the last 8760 hours. HbA1C: No results for input(s): HGBA1C in the last 72 hours. CBG: No results for input(s): GLUCAP in the last 168 hours. Lipid Profile: No results for input(s): CHOL, HDL, LDLCALC, TRIG, CHOLHDL, LDLDIRECT in the last 72 hours. Thyroid Function Tests: No results for input(s): TSH, T4TOTAL, FREET4, T3FREE, THYROIDAB in the last 72 hours. Anemia Panel: Recent Labs    07/20/20 0554  FERRITIN 19   Sepsis Labs: Recent Labs  Lab 07/19/20 0504  LATICACIDVEN 1.6    Recent Results (from the past 240 hour(s))  Blood culture (routine single)     Status: None (Preliminary result)   Collection Time: 07/19/20  5:06 AM   Specimen: BLOOD  Result Value Ref Range Status   Specimen Description BLOOD LEFT ANTECUBITAL  Final   Special Requests   Final    BOTTLES DRAWN AEROBIC AND ANAEROBIC Blood Culture adequate volume   Culture   Final    NO GROWTH 1 DAY Performed at [facility], [address], [place], [place] [address]    Report Status PENDING  Incomplete  Resp Panel by RT-PCR (Flu A&B, Covid) Nasopharyngeal Swab     Status: Abnormal   Collection Time: 07/19/20  7:56 AM   Specimen: Nasopharyngeal Swab; Nasopharyngeal(NP) swabs in vial transport medium  Result Value Ref Range Status   SARS Coronavirus 2 by RT PCR POSITIVE (A) NEGATIVE Final    Comment: RESULT CALLED TO, READ BACK BY AND VERIFIED WITH: Charlotte Crumb RN AT 0901 ON 07/19/20 SNG (NOTE) SARS-CoV-2  target nucleic acids are DETECTED.  The SARS-CoV-2 RNA is generally detectable in upper respiratory specimens during the acute phase of infection. Positive results are indicative of the presence of the identified virus, but do not rule out bacterial infection or co-infection with other pathogens not detected by the test. Clinical correlation with patient history and other diagnostic information is necessary to determine patient infection status. The expected result is Negative.  Fact Sheet for Patients: EntrepreneurPulse.com.au  Fact Sheet for Healthcare Providers: IncredibleEmployment.be  This test is not yet approved or cleared by the [place] FDA and  has been authorized for detection and/or diagnosis of SARS-CoV-2 by FDA  under an Emergency Use Authorization (EUA).  This EUA will remain in effect (meaning this test  can be used) for the duration of  the COVID-19 declaration under Section 564(b)(1) of the Act, 21 U.S.C. section 360bbb-3(b)(1), unless the authorization is terminated or revoked sooner.     Influenza A by PCR NEGATIVE NEGATIVE Final   Influenza B by PCR NEGATIVE NEGATIVE Final    Comment: (NOTE) The Xpert Xpress SARS-CoV-2/FLU/RSV plus assay is intended as an aid in the diagnosis of influenza from Nasopharyngeal swab specimens and should not be used as a sole basis for treatment. Nasal washings and aspirates are unacceptable for Xpert Xpress SARS-CoV-2/FLU/RSV testing.  Fact Sheet for Patients: EntrepreneurPulse.com.au  Fact Sheet for Healthcare Providers: IncredibleEmployment.be  This test is not yet approved or cleared by the [place] FDA and has been authorized for detection and/or diagnosis of SARS-CoV-2 by FDA under an Emergency Use Authorization (EUA). This EUA will remain in effect (meaning this test can be used) for the duration of the COVID-19 declaration under Section  564(b)(1) of the Act, 21 U.S.C. section 360bbb-3(b)(1), unless the authorization is terminated or revoked.  Performed at [facility], [address] [place], [place] [address]   Urine culture     Status: Abnormal   Collection Time: 07/19/20  8:02 AM   Specimen: In/Out Cath Urine  Result Value Ref Range Status   Specimen Description   Final    IN/OUT CATH URINE Performed at [facility], [address], [place], [place] [address]    Special Requests   Final    NONE Performed at [facility], [address] [place], [place] [address]    Culture MULTIPLE SPECIES PRESENT, SUGGEST RECOLLECTION (A)  Final   Report Status 07/20/2020 FINAL  Final      Radiology Studies: DG Chest 1 View  Result Date: 07/19/2020 CLINICAL DATA:  65 year old female with malaise and nausea EXAM: CHEST  1 VIEW COMPARISON:  03/04/2019 FINDINGS: Cardiomediastinal silhouette unchanged in size and contour. No pneumothorax. No pleural effusion. No confluent airspace disease. Coarsened interstitial markings throughout the lungs, similar to the comparison. No displaced fracture IMPRESSION: Chronic lung changes without evidence of acute cardiopulmonary disease Electronically Signed   By: Corrie Mckusick D.O.   On: 07/19/2020 09:43   DG Abdomen Acute W/Chest  Result Date: 07/19/2020 CLINICAL DATA:  Abdominal pain and vomiting EXAM: DG ABDOMEN ACUTE WITH 1 VIEW CHEST COMPARISON:  Abdominal CT 11/22/2019 FINDINGS: Normal bowel gas pattern. Atheromatous calcification. No concerning mass effect or gas collection. Normal heart size and mediastinal contours. No acute infiltrate or edema. No effusion or pneumothorax. Generalized degenerative endplate spurring. Artifact from EKG leads. IMPRESSION: No acute finding in the chest or abdomen Electronically Signed   By: Monte Fantasia M.D.   On: 07/19/2020 05:54     Scheduled Meds: . enoxaparin (LOVENOX) injection  40 mg Subcutaneous Q24H  .  lisinopril  20 mg Oral Daily  . metoprolol succinate  25 mg Oral Daily  . potassium & sodium phosphates  2 packet Oral TID  . sucralfate  1 g Oral TID WC & HS   Continuous Infusions:   LOS: 0 days     [name], MD [facility] If 7PM-7AM, please contact night-coverage 07/20/2020, 7:11 PM

## 2020-07-21 LAB — BASIC METABOLIC PANEL
Anion gap: 6 (ref 5–15)
BUN: 9 mg/dL (ref 8–23)
CO2: 26 mmol/L (ref 22–32)
Calcium: 9.3 mg/dL (ref 8.9–10.3)
Chloride: 104 mmol/L (ref 98–111)
Creatinine, Ser: 0.57 mg/dL (ref 0.44–1.00)
GFR, Estimated: >60 mL/min (ref >60)
Glucose, Bld: 133 mg/dL — ABNORMAL HIGH (ref 70–99)
Potassium: 4.1 mmol/L (ref 3.5–5.1)
Sodium: 136 mmol/L (ref 135–145)

## 2020-07-21 LAB — MAGNESIUM: Magnesium: 1.7 mg/dL (ref 1.7–2.4)

## 2020-07-21 LAB — CBC
HCT: 33.5 % — ABNORMAL LOW (ref 36.0–46.0)
Hemoglobin: 11.3 g/dL — ABNORMAL LOW (ref 12.0–15.0)
MCH: 27 pg (ref 26.0–34.0)
MCHC: 33.7 g/dL (ref 30.0–36.0)
MCV: 80 fL (ref 80.0–100.0)
Platelets: 230 10*3/uL (ref 150–400)
RBC: 4.19 MIL/uL (ref 3.87–5.11)
RDW: 17.6 % — ABNORMAL HIGH (ref 11.5–15.5)
WBC: 4.9 10*3/uL (ref 4.0–10.5)
nRBC: 0 % (ref 0.0–0.2)

## 2020-07-21 LAB — C-REACTIVE PROTEIN: CRP: 0.7 mg/dL (ref <1.0)

## 2020-07-21 MED ORDER — ONDANSETRON HCL 4 MG PO TABS: 4.0000 mg | ORAL_TABLET | Freq: Three times a day (TID) | ORAL | 0 refills | Status: AC | PRN

## 2020-07-21 MED ORDER — METOPROLOL SUCCINATE ER 25 MG PO TB24: 25.0000 mg | ORAL_TABLET | Freq: Every day | ORAL | 2 refills | Status: DC

## 2020-07-21 MED ORDER — LISINOPRIL 20 MG PO TABS: 20.0000 mg | ORAL_TABLET | Freq: Every day | ORAL | 2 refills | Status: DC

## 2020-07-21 NOTE — Progress Notes (Signed)
AVS given at this tim, daughter denied questions. IV removed. Pt to daughter car with Probation officer assistance.

## 2020-07-21 NOTE — Care Management (Signed)
Patient admitted from home with hypokalemia Due to covid isolation assessment completed via phone  Patient states that she lives with her daughter Ivin Booty 2 other daughters live locally  Patient states she does not have PCP.  TOC contacted Medicaid Office and confirmed that Patients PCP is Dr Amalia Hailey at [facility].  I have updated the chart with this information, and added follow up appointment recommendations to AVS.  Unit secretary to attempt to make an appointment prior to discharge   Daughters to rotate providing care for the patient.  Patient states that she has a RW and cane in the home  Patient declined substance abuse resources  Daughter Ivin Booty picked up at discharge.  She request a copy of the substance abuse resources for a referance.  Provided hard copy with discharge paper work

## 2020-07-21 NOTE — Progress Notes (Signed)
Patient had incontinent episode during the night, patient was taken to the bathroom where she refused to sit down on the toilet and wanted to squat, urinating all over the floor. Patient had intermittent confusion during the night but was not given ativan due to previous sedation. No needs at this time.

## 2020-07-21 NOTE — Evaluation (Signed)
Physical Therapy Evaluation Patient Details Name: Donna Aguirre MRN: [id number] DOB: 1956-04-01 Today's Date: 07/21/2020   History of Present Illness  65 y.o. female with medical history significant for alcohol and nicotine dependence, hypertension, gout who presents to the ER for evaluation of abdominal pain which is mostly in the periumbilical area  Clinical Impression  Pt with some general impulsivity with activity and struggled to stay on task with PT asking PLOF and home living questions.  She did not show any overt LOBs but again general unpredictability with mobility in room w/o AD.  She reports being roughly at her baseline, questionable how much HHPT would benefit her, however given confusion supervision with activity is suggested going forward.   Follow Up Recommendations No PT follow up;Supervision/Assistance - 24 hour;Supervision for mobility/OOB    Equipment Recommendations  None recommended by PT    Recommendations for Other Services       Precautions / Restrictions Precautions Precautions: Fall Restrictions Weight Bearing Restrictions: No      Mobility  Bed Mobility Overal bed mobility: Modified Independent             General bed mobility comments: Pt able to get to sitting EOB confidently    Transfers Overall transfer level: Modified independent Equipment used:  (offered walker, pt refused)             General transfer comment: Pt was able to rise to standing w/o assist, showed some minimal unsteadiness but no LOBs.  Bigger issues was general awareness; safety and otherwise.  Ambulation/Gait Ambulation/Gait assistance: Supervision Gait Distance (Feet): 45 Feet Assistive device: None       General Gait Details: Pt was able to ambulate the the door and back w/o AD or assist, but again did show some impulsivity and general lack of awareness.  Stairs            Wheelchair Mobility    Modified Rankin (Stroke Patients Only)        Balance Overall balance assessment: Modified Independent                                           Pertinent Vitals/Pain Pain Assessment: No/denies pain    Home Living Family/patient expects to be discharged to:: Private residence Living Arrangements: Children;Other relatives (apparently 3 daughters help, one stays there)               Additional Comments: difficult to get a lot of consistent, viable history/background out of pt.  A lot of tangential rambling, etc    Prior Function           Comments: per pt she is able to get out and be active w/o AD     Hand Dominance        Extremity/Trunk Assessment   Upper Extremity Assessment Upper Extremity Assessment: Generalized weakness;Overall [facility] for tasks assessed    Lower Extremity Assessment Lower Extremity Assessment: Generalized weakness;Overall WFL for tasks assessed       Communication   Communication:  (pt talking (mumbling) nearly the entire time PT in the room, struggled to stay on task)  Cognition Arousal/Alertness: Awake/alert Behavior During Therapy: Impulsive;Restless Overall Cognitive Status: Difficult to assess  General Comments      Exercises     Assessment/Plan    PT Assessment    PT Problem List         PT Treatment Interventions      PT Goals (Current goals can be found in the Care Plan section)  Acute Rehab PT Goals Patient Stated Goal: Go home PT Goal Formulation: With patient Time For Goal Achievement: 07/21/20    Frequency     Barriers to discharge        Co-evaluation               AM-PAC PT "6 Clicks" Mobility  Outcome Measure Help needed turning from your back to your side while in a flat bed without using bedrails?: None Help needed moving from lying on your back to sitting on the side of a flat bed without using bedrails?: None Help needed moving to and from a bed to a chair  (including a wheelchair)?: None Help needed standing up from a chair using your arms (e.g., wheelchair or bedside chair)?: None Help needed to walk in hospital room?: A Little Help needed climbing 3-5 steps with a railing? : A Little 6 Click Score: 22    End of Session   Activity Tolerance: Patient tolerated treatment well Patient left: with bed alarm set;with call bell/phone within reach Nurse Communication: Mobility status PT Visit Diagnosis: Difficulty in walking, not elsewhere classified (R26.2);Unsteadiness on feet (R26.81)    Time: 5093-2671 PT Time Calculation (min) (ACUTE ONLY): 26 min   Charges:   PT Evaluation $PT Eval Low Complexity: 1 Low          Kreg Shropshire, DPT 07/21/2020, 1:10 PM

## 2020-07-21 NOTE — Discharge Summary (Signed)
Physician Discharge Summary   Donna Aguirre  female DOB: Mar 21, 1956  DGU:[id number]  PCP: Ardine Eng, MD  Admit date: 07/19/2020 Discharge date: 07/21/2020  Admitted From: home Disposition:  home Daughter updated on the phone about discharge plans prior to discharge.  CODE STATUS: Full code  Discharge Instructions    Discharge instructions   Complete by: As directed    You have COVID infection, but no breathing problems, so you can go home to recover.  Your blood pressure is high.  I have prescribed you 2 blood pressure medications, Toprol and Lisinopril.  Please take them as directed, and also follow up with primary care doctor for further adjustment.   Dr. [name] - -      27 Day Unplanned Readmission Risk Score   Flowsheet Row ED to Hosp-Admission (Current) from 07/19/2020 in [place]  30 Day Unplanned Readmission Risk Score (%) 12.49 Filed at 07/21/2020 0801     This score is the patient's risk of an unplanned readmission within 30 days of being discharged (0 -100%). The score is based on dignosis, age, lab data, medications, orders, and past utilization.   Low:  0-14.9   Medium: 15-21.9   High: 22-29.9   Extreme: 30 and above         Hospital Course:  For full details, please see H&P, progress notes, consult notes and ancillary notes.  Briefly,  Donna Moskowitz Hesteris a 65 y.o.femalewith medical history significant foralcohol and nicotine dependence, hypertension, gout who presented to the ER for evaluation of abdominal pain which is mostly in the periumbilical area. She rated her pain a 6 x 10 in intensity at its worst and the pain was nonradiating was associated with nausea and multiple episodes of emesis. By the time she arrived to the ER her abdominal pain had resolved, she remained nauseous but had no further episodes of emesis. She complains of feeling very weak and tired but denies having any fever, no sore  throat, no nasal congestion, no cough, no shortness of breath, no myalgias. She denies having any loss of consciousness or any falls. She admits to continued alcohol use and denies having symptoms of alcohol withdrawal when she does not drink.   COVID-19 gastroenteritis  --No respiratory symptoms.  No hypoxia. --No need to treat. --supportive care given including IVF. Pt's symptoms improved prior to discharge.  Somnolence likely due to ativan Pt became very somnolent after receiving IV ativan 2 mg per CIWA protocol.  Ativan was held, and pt became more alert.  Hypokalemia/hypomagnesemia Most likely related to GI losses from nausea and vomiting.  Both were repleted PRN.  Hypertension BP varied widely, sometimes up to 956'L systolic.  Pt was not taking BP medication at home.  Pt was started on Toprol to 25 mg daily and Lisinopril 20 mg daily.  alcohol dependence/abuse Patient admits to continued alcohol use but denies having symptoms of alcohol withdrawal when she does not drink.  Daughter was concerned about pt's alcohol abuse and asked for resources for alcohol cessation.  Continued thiamine, MVI and folic acid.  Nicotine dependence Smoking cessation was discussed with patient in detail. She declines a nicotine transdermal patch at this time   Discharge Diagnoses:  Principal Problem:   Hypokalemia Active Problems:   HTN (hypertension)   Hypomagnesemia   Alcohol abuse   COVID-19 virus detected   Nicotine dependence   Gastroenteritis due to COVID-19 virus    Discharge Instructions:  Allergies  as of 07/21/2020   No Known Allergies     Medication List    TAKE these medications   lisinopril 20 MG tablet Commonly known as: ZESTRIL Take 1 tablet (20 mg total) by mouth daily. Start taking on: July 22, 2020   metoprolol succinate 25 MG 24 hr tablet Commonly known as: TOPROL-XL Take 1 tablet (25 mg total) by mouth daily. What changed:   how much to  take  when to take this   omeprazole 40 MG capsule Commonly known as: PRILOSEC Take 1 capsule (40 mg total) by mouth 2 (two) times daily before a meal.   ondansetron 4 MG tablet Commonly known as: Zofran Take 1 tablet (4 mg total) by mouth every 8 (eight) hours as needed for up to 5 days for nausea or vomiting.        Follow-up Information    Go to [facility], Neldon Labella, MD.   Specialty: Family Medicine Why: hospital follow up appointment  Contact information: [id number] [facility] [place] [address] [phone]               No Known Allergies   The results of significant diagnostics from this hospitalization (including imaging, microbiology, ancillary and laboratory) are listed below for reference.   Consultations:   Procedures/Studies: DG Chest 1 View  Result Date: 07/19/2020 CLINICAL DATA:  65 year old female with malaise and nausea EXAM: CHEST  1 VIEW COMPARISON:  03/04/2019 FINDINGS: Cardiomediastinal silhouette unchanged in size and contour. No pneumothorax. No pleural effusion. No confluent airspace disease. Coarsened interstitial markings throughout the lungs, similar to the comparison. No displaced fracture IMPRESSION: Chronic lung changes without evidence of acute cardiopulmonary disease Electronically Signed   By: Corrie Mckusick D.O.   On: 07/19/2020 09:43   DG Abdomen Acute W/Chest  Result Date: 07/19/2020 CLINICAL DATA:  Abdominal pain and vomiting EXAM: DG ABDOMEN ACUTE WITH 1 VIEW CHEST COMPARISON:  Abdominal CT 11/22/2019 FINDINGS: Normal bowel gas pattern. Atheromatous calcification. No concerning mass effect or gas collection. Normal heart size and mediastinal contours. No acute infiltrate or edema. No effusion or pneumothorax. Generalized degenerative endplate spurring. Artifact from EKG leads. IMPRESSION: No acute finding in the chest or abdomen Electronically Signed   By: Monte Fantasia M.D.   On: 07/19/2020 05:54       Labs: BNP (last 3 results) No results for input(s): BNP in the last 8760 hours. Basic Metabolic Panel: Recent Labs  Lab 07/19/20 0504 07/19/20 1318 07/20/20 0554 07/21/20 0528  NA 132*  --  134* 136  K 2.4*  --  4.2 4.1  CL 91*  --  100 104  CO2 28  --  24 26  GLUCOSE 116*  --  111* 133*  BUN <5*  --  <5* 9  CREATININE 0.52  --  0.41* 0.57  CALCIUM 8.7*  --  9.1 9.3  MG 1.4* 2.4 1.7 1.7  PHOS  --  2.6 2.4*  --    Liver Function Tests: Recent Labs  Lab 07/19/20 0504 07/20/20 0554  AST 25 19  ALT 10 8  ALKPHOS 65 62  BILITOT 1.0 1.1  PROT 8.5* 8.6*  ALBUMIN 3.7 3.5   Recent Labs  Lab 07/19/20 0504  LIPASE 20   No results for input(s): AMMONIA in the last 168 hours. CBC: Recent Labs  Lab 07/19/20 0504 07/19/20 1240 07/20/20 0554 07/21/20 0528  WBC 3.8* 4.2 3.8* 4.9  NEUTROABS 2.4  --  2.6  --  HGB 11.8* 12.5 13.0 11.3*  HCT 34.7* 37.3 39.1 33.5*  MCV 79.2* 79.9* 80.3 80.0  PLT 183 191 209 230   Cardiac Enzymes: No results for input(s): CKTOTAL, CKMB, CKMBINDEX, TROPONINI in the last 168 hours. BNP: Invalid input(s): POCBNP CBG: No results for input(s): GLUCAP in the last 168 hours. D-Dimer No results for input(s): DDIMER in the last 72 hours. Hgb A1c No results for input(s): HGBA1C in the last 72 hours. Lipid Profile No results for input(s): CHOL, HDL, LDLCALC, TRIG, CHOLHDL, LDLDIRECT in the last 72 hours. Thyroid function studies No results for input(s): TSH, T4TOTAL, T3FREE, THYROIDAB in the last 72 hours.  Invalid input(s): FREET3 Anemia work up Recent Labs    07/20/20 0554  FERRITIN 19   Urinalysis    Component Value Date/Time   COLORURINE YELLOW (A) 07/19/2020 0802   APPEARANCEUR CLEAR (A) 07/19/2020 0802   LABSPEC 1.008 07/19/2020 0802   PHURINE 7.0 07/19/2020 0802   GLUCOSEU NEGATIVE 07/19/2020 0802   HGBUR NEGATIVE 07/19/2020 0802   BILIRUBINUR NEGATIVE 07/19/2020 0802   KETONESUR 5 (A) 07/19/2020 0802   PROTEINUR  NEGATIVE 07/19/2020 0802   NITRITE NEGATIVE 07/19/2020 0802   LEUKOCYTESUR NEGATIVE 07/19/2020 0802   Sepsis Labs Invalid input(s): PROCALCITONIN,  WBC,  LACTICIDVEN Microbiology Recent Results (from the past 240 hour(s))  Blood culture (routine single)     Status: None (Preliminary result)   Collection Time: 07/19/20  5:06 AM   Specimen: BLOOD  Result Value Ref Range Status   Specimen Description BLOOD LEFT ANTECUBITAL  Final   Special Requests   Final    BOTTLES DRAWN AEROBIC AND ANAEROBIC Blood Culture adequate volume   Culture   Final    NO GROWTH 2 DAYS Performed at [facility], [address] [place], [place] [address]    Report Status PENDING  Incomplete  Resp Panel by RT-PCR (Flu A&B, Covid) Nasopharyngeal Swab     Status: Abnormal   Collection Time: 07/19/20  7:56 AM   Specimen: Nasopharyngeal Swab; Nasopharyngeal(NP) swabs in vial transport medium  Result Value Ref Range Status   SARS Coronavirus 2 by RT PCR POSITIVE (A) NEGATIVE Final    Comment: RESULT CALLED TO, READ BACK BY AND VERIFIED WITH: Charlotte Crumb RN AT 0901 ON 07/19/20 SNG (NOTE) SARS-CoV-2 target nucleic acids are DETECTED.  The SARS-CoV-2 RNA is generally detectable in upper respiratory specimens during the acute phase of infection. Positive results are indicative of the presence of the identified virus, but do not rule out bacterial infection or co-infection with other pathogens not detected by the test. Clinical correlation with patient history and other diagnostic information is necessary to determine patient infection status. The expected result is Negative.  Fact Sheet for Patients: EntrepreneurPulse.com.au  Fact Sheet for Healthcare Providers: IncredibleEmployment.be  This test is not yet approved or cleared by the [place] FDA and  has been authorized for detection and/or diagnosis of SARS-CoV-2 by FDA under an Emergency Use Authorization  (EUA).  This EUA will remain in effect (meaning this test  can be used) for the duration of  the COVID-19 declaration under Section 564(b)(1) of the Act, 21 U.S.C. section 360bbb-3(b)(1), unless the authorization is terminated or revoked sooner.     Influenza A by PCR NEGATIVE NEGATIVE Final   Influenza B by PCR NEGATIVE NEGATIVE Final    Comment: (NOTE) The Xpert Xpress SARS-CoV-2/FLU/RSV plus assay is intended as an aid in the diagnosis of influenza from Nasopharyngeal swab specimens and should not  be used as a sole basis for treatment. Nasal washings and aspirates are unacceptable for Xpert Xpress SARS-CoV-2/FLU/RSV testing.  Fact Sheet for Patients: EntrepreneurPulse.com.au  Fact Sheet for Healthcare Providers: IncredibleEmployment.be  This test is not yet approved or cleared by the [place] FDA and has been authorized for detection and/or diagnosis of SARS-CoV-2 by FDA under an Emergency Use Authorization (EUA). This EUA will remain in effect (meaning this test can be used) for the duration of the COVID-19 declaration under Section 564(b)(1) of the Act, 21 U.S.C. section 360bbb-3(b)(1), unless the authorization is terminated or revoked.  Performed at [facility], [address] [place], [place] [address]   Urine culture     Status: Abnormal   Collection Time: 07/19/20  8:02 AM   Specimen: In/Out Cath Urine  Result Value Ref Range Status   Specimen Description   Final    IN/OUT CATH URINE Performed at [facility], [address], [place], [place] [address]    Special Requests   Final    NONE Performed at [facility], [address] [place],  [address]    Culture MULTIPLE SPECIES PRESENT, SUGGEST RECOLLECTION (A)  Final   Report Status 07/20/2020 FINAL  Final     Total time spend on discharging this patient, including the last patient exam, discussing the hospital stay, instructions  for ongoing care as it relates to all pertinent caregivers, as well as preparing the medical discharge records, prescriptions, and/or referrals as applicable, is 45 minutes.    [name], MD  [facility] 07/21/2020, 11:12 AM

## 2020-07-24 LAB — CULTURE, BLOOD (SINGLE)
Culture: NO GROWTH
Special Requests: ADEQUATE

## 2020-08-24 ENCOUNTER — Other Ambulatory Visit: Payer: Self-pay

## 2020-08-24 ENCOUNTER — Emergency Department: Payer: Medicaid Other

## 2020-08-24 DIAGNOSIS — Z8616 Personal history of COVID-19: Secondary | ICD-10-CM

## 2020-08-24 DIAGNOSIS — F17210 Nicotine dependence, cigarettes, uncomplicated: Secondary | ICD-10-CM | POA: Diagnosis present

## 2020-08-24 DIAGNOSIS — K219 Gastro-esophageal reflux disease without esophagitis: Secondary | ICD-10-CM | POA: Diagnosis present

## 2020-08-24 DIAGNOSIS — I440 Atrioventricular block, first degree: Secondary | ICD-10-CM | POA: Diagnosis present

## 2020-08-24 DIAGNOSIS — E871 Hypo-osmolality and hyponatremia: Secondary | ICD-10-CM | POA: Diagnosis present

## 2020-08-24 DIAGNOSIS — E860 Dehydration: Secondary | ICD-10-CM | POA: Diagnosis present

## 2020-08-24 DIAGNOSIS — R4781 Slurred speech: Secondary | ICD-10-CM | POA: Diagnosis present

## 2020-08-24 DIAGNOSIS — E119 Type 2 diabetes mellitus without complications: Secondary | ICD-10-CM | POA: Diagnosis present

## 2020-08-24 DIAGNOSIS — F1010 Alcohol abuse, uncomplicated: Secondary | ICD-10-CM | POA: Diagnosis present

## 2020-08-24 DIAGNOSIS — K222 Esophageal obstruction: Secondary | ICD-10-CM | POA: Diagnosis present

## 2020-08-24 DIAGNOSIS — E876 Hypokalemia: Secondary | ICD-10-CM | POA: Diagnosis present

## 2020-08-24 DIAGNOSIS — I161 Hypertensive emergency: Secondary | ICD-10-CM | POA: Diagnosis present

## 2020-08-24 DIAGNOSIS — R0982 Postnasal drip: Secondary | ICD-10-CM | POA: Diagnosis present

## 2020-08-24 DIAGNOSIS — Z8249 Family history of ischemic heart disease and other diseases of the circulatory system: Secondary | ICD-10-CM

## 2020-08-24 DIAGNOSIS — Z79899 Other long term (current) drug therapy: Secondary | ICD-10-CM

## 2020-08-24 DIAGNOSIS — E861 Hypovolemia: Secondary | ICD-10-CM | POA: Diagnosis present

## 2020-08-24 DIAGNOSIS — I10 Essential (primary) hypertension: Secondary | ICD-10-CM | POA: Diagnosis present

## 2020-08-24 DIAGNOSIS — Z20822 Contact with and (suspected) exposure to covid-19: Secondary | ICD-10-CM | POA: Diagnosis present

## 2020-08-24 DIAGNOSIS — I674 Hypertensive encephalopathy: Principal | ICD-10-CM | POA: Diagnosis present

## 2020-08-24 LAB — LIPASE, BLOOD: Lipase: 59 U/L — ABNORMAL HIGH (ref 11–51)

## 2020-08-24 LAB — CBC
HCT: 33.8 % — ABNORMAL LOW (ref 36.0–46.0)
Hemoglobin: 11.3 g/dL — ABNORMAL LOW (ref 12.0–15.0)
MCH: 26.1 pg (ref 26.0–34.0)
MCHC: 33.4 g/dL (ref 30.0–36.0)
MCV: 78.1 fL — ABNORMAL LOW (ref 80.0–100.0)
Platelets: 302 10*3/uL (ref 150–400)
RBC: 4.33 MIL/uL (ref 3.87–5.11)
RDW: 15.2 % (ref 11.5–15.5)
WBC: 5.6 10*3/uL (ref 4.0–10.5)
nRBC: 0 % (ref 0.0–0.2)

## 2020-08-24 LAB — COMPREHENSIVE METABOLIC PANEL
ALT: 9 U/L (ref 0–44)
AST: 17 U/L (ref 15–41)
Albumin: 4.1 g/dL (ref 3.5–5.0)
Alkaline Phosphatase: 76 U/L (ref 38–126)
Anion gap: 10 (ref 5–15)
BUN: <5 mg/dL — ABNORMAL LOW (ref 8–23)
CO2: 26 mmol/L (ref 22–32)
Calcium: 9.1 mg/dL (ref 8.9–10.3)
Chloride: 92 mmol/L — ABNORMAL LOW (ref 98–111)
Creatinine, Ser: 0.41 mg/dL — ABNORMAL LOW (ref 0.44–1.00)
GFR, Estimated: >60 mL/min (ref >60)
Glucose, Bld: 117 mg/dL — ABNORMAL HIGH (ref 70–99)
Potassium: 3 mmol/L — ABNORMAL LOW (ref 3.5–5.1)
Sodium: 128 mmol/L — ABNORMAL LOW (ref 135–145)
Total Bilirubin: 1.2 mg/dL (ref 0.3–1.2)
Total Protein: 9 g/dL — ABNORMAL HIGH (ref 6.5–8.1)

## 2020-08-24 LAB — TROPONIN I (HIGH SENSITIVITY): Troponin I (High Sensitivity): 23 ng/L — ABNORMAL HIGH (ref <18)

## 2020-08-24 IMAGING — CT CT ABD-PELV W/ CM
2 of 5 series · 15 of 46 positions shown, 17 images · IV contrast (APPLIED)
Comparison: [DATE]

CLINICAL DATA: Nausea and vomiting with abdominal and chest pain,
history of prior [D1] infection

EXAM:
CT ANGIOGRAPHY CHEST
CT ABDOMEN AND PELVIS WITH CONTRAST
TECHNIQUE: Multidetector CT imaging of the chest was performed using the
standard protocol during bolus administration of intravenous
contrast. Multiplanar CT image reconstructions and MIPs were
obtained to evaluate the vascular anatomy. Multidetector CT imaging
of the abdomen and pelvis was performed using the standard protocol
during bolus administration of intravenous contrast.
CONTRAST:  100mL OMNIPAQUE IOHEXOL 350 MG/ML SOLN

[Series 2: axial st · axial · 0.82mm/px · z∈[-311,+59]mm · 12 of 84 slices shown, 14 images]
[im 5/84  soft-tissue]
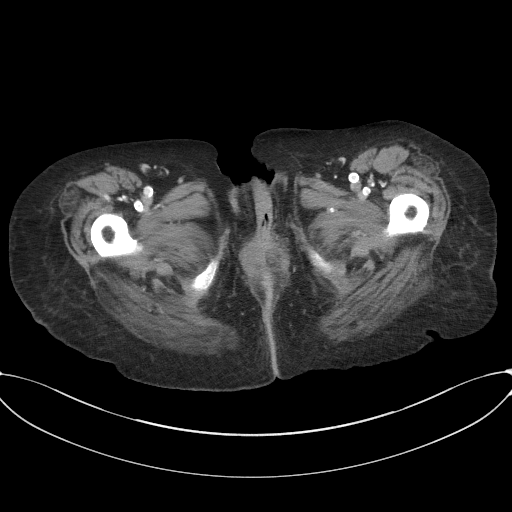
[im 5/84  bone]
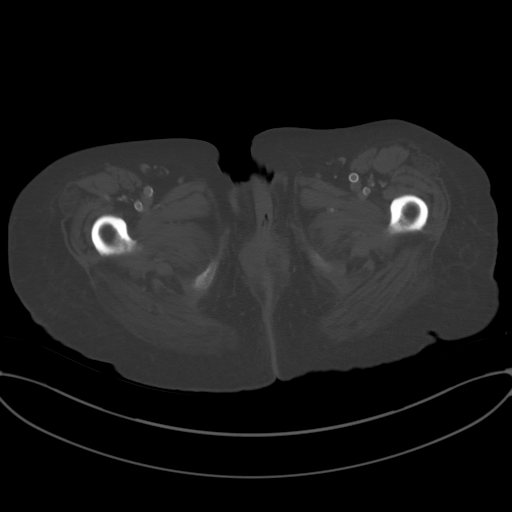
[im 15/84  soft-tissue]
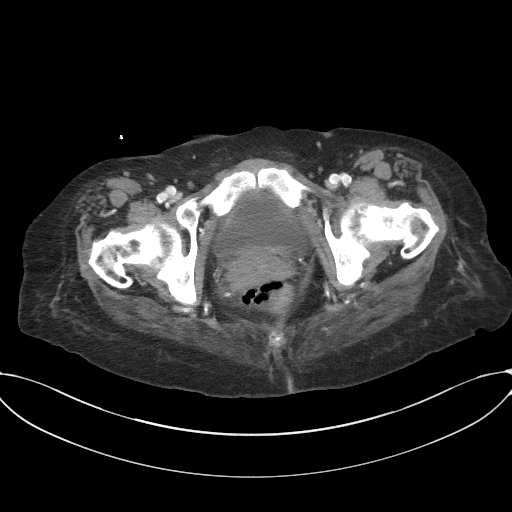
[im 20/84  soft-tissue]
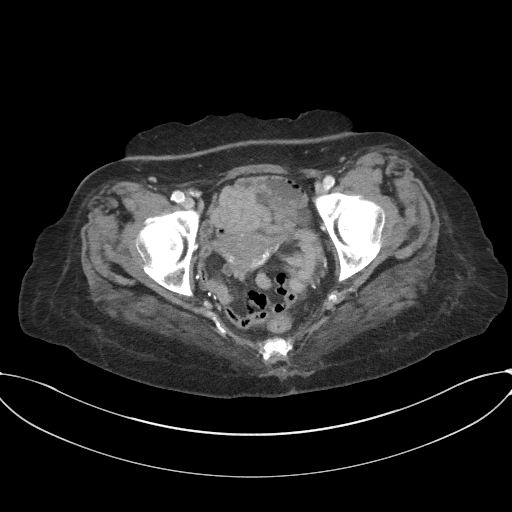
[im 25/84  soft-tissue]
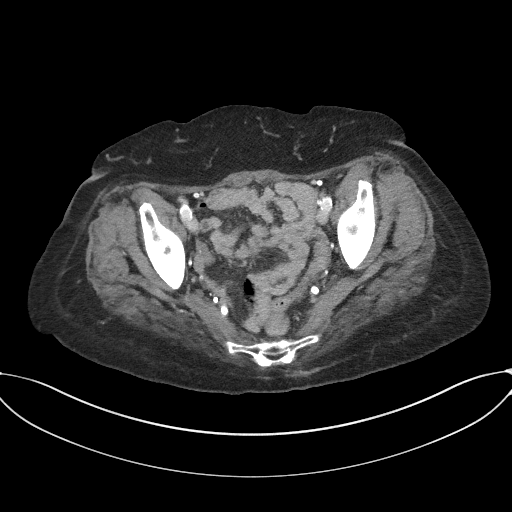
[im 35/84  soft-tissue]
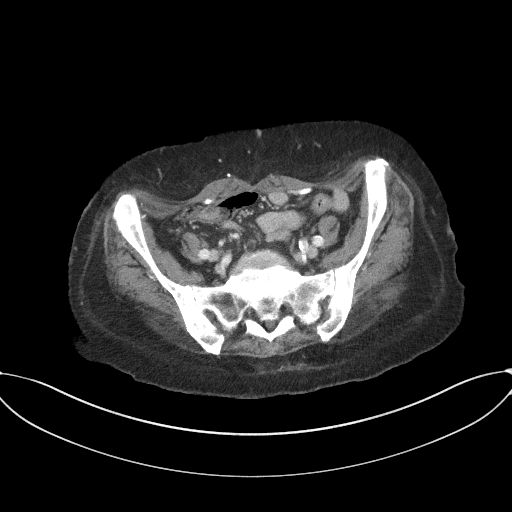
[im 40/84  soft-tissue]
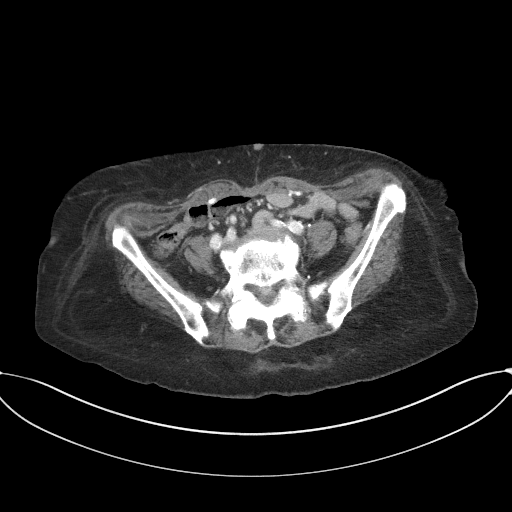
[im 44/84  soft-tissue]
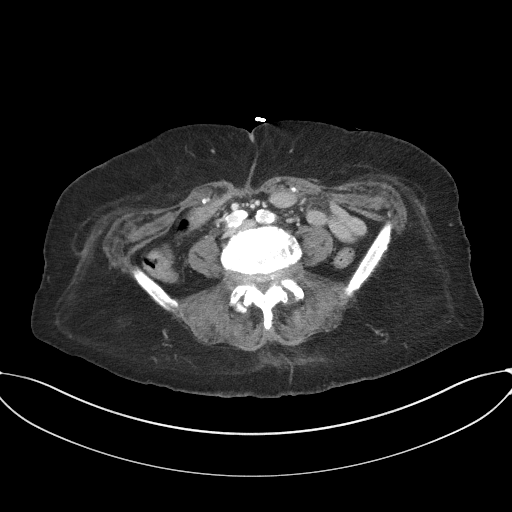
[im 54/84  soft-tissue]
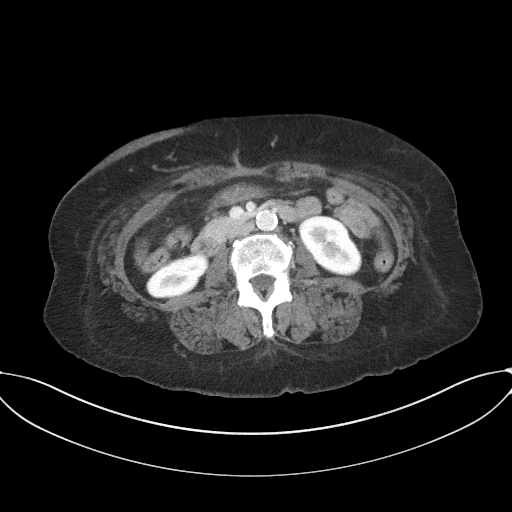
[im 59/84  soft-tissue]
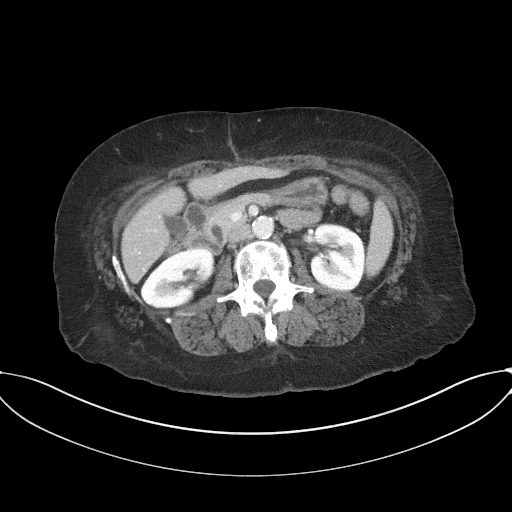
[im 59/84  bone]
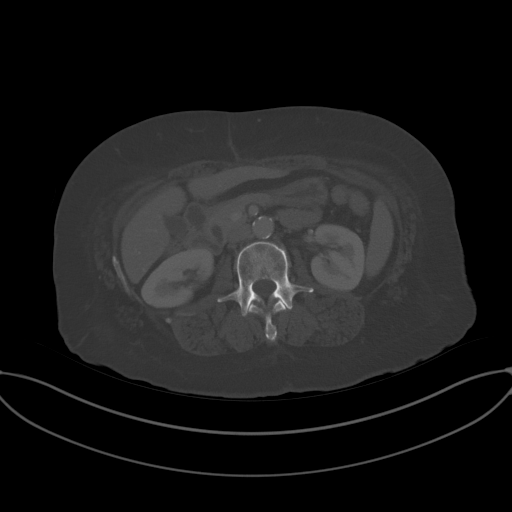
[im 64/84  soft-tissue]
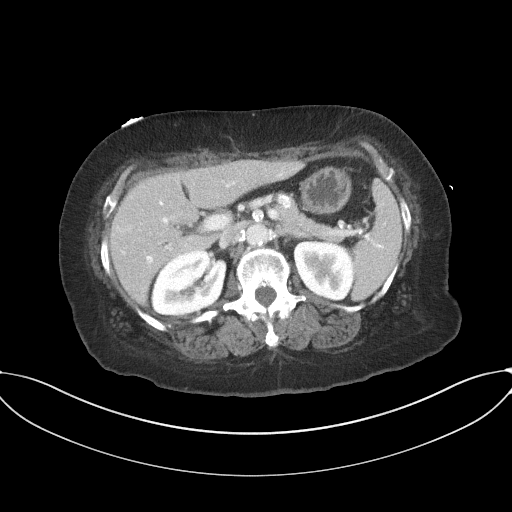
[im 74/84  soft-tissue]
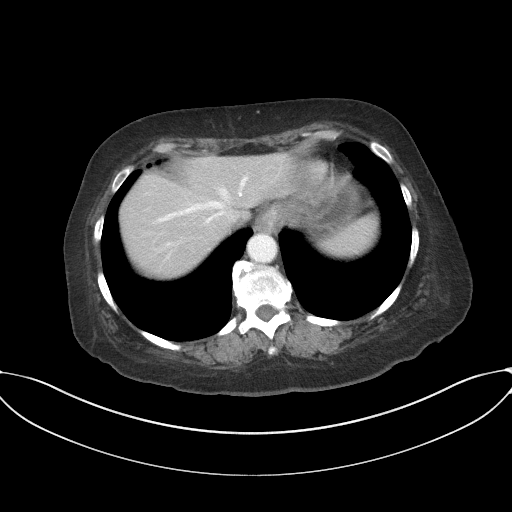
[im 79/84  soft-tissue]
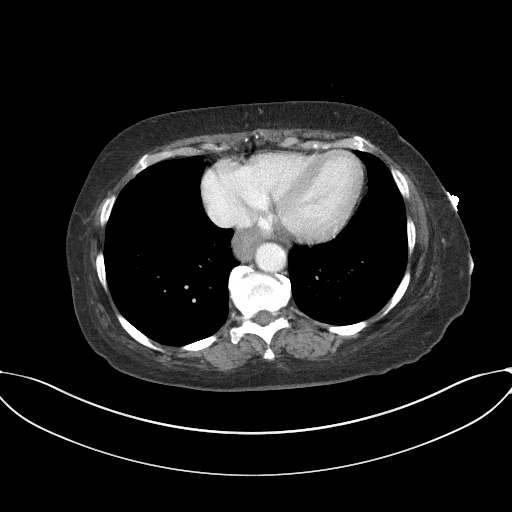

[Series 5: coronal st · coronal · 0.61mm/px · 3 of 78 slices shown]
[im 26/78  soft-tissue]
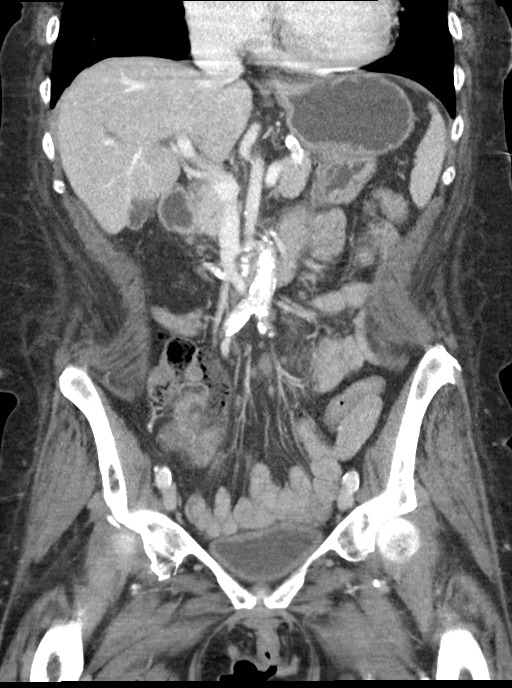
[im 35/78  soft-tissue]
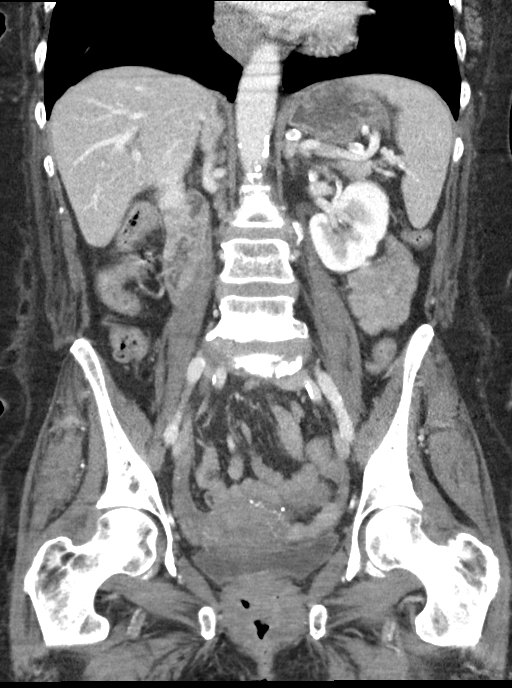
[im 43/78  soft-tissue]
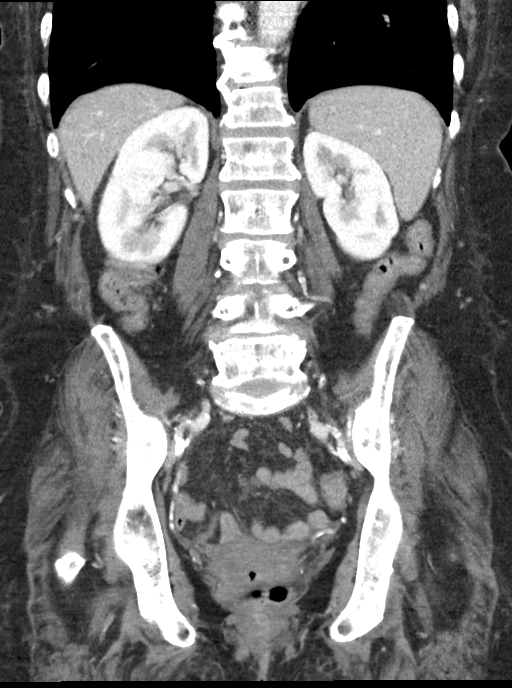

[15 of 46 positions shown; findings below may reference images not displayed]

FINDINGS: CTA CHEST FINDINGS

Cardiovascular: Heart is at the upper limits of normal in size.
Aortic calcifications are noted. Coronary calcifications are seen as
well. No dissection or aneurysmal dilatation is noted. The pulmonary
artery shows a normal branching pattern bilaterally. No filling
defects to suggest pulmonary emboli are noted.

Mediastinum/Nodes: Thoracic inlet is within normal limits. No hilar
or mediastinal adenopathy is noted. The esophagus is significantly
dilated with air and ingested food stuffs. This may also be related
to reflux. No significant wall thickening is noted.

Lungs/Pleura: The lungs are well aerated bilaterally. Some patchy
ground-glass airspace opacities are noted primarily in the right
upper lobe likely related to the prior [D1] infection. No focal
infiltrate or effusion is seen. No sizable parenchymal nodule is
noted.

Musculoskeletal: Degenerative changes of the thoracic spine are
noted. Healed sternal fracture is noted.

Review of the MIP images confirms the above findings.

CT ABDOMEN and PELVIS FINDINGS

Hepatobiliary: No focal liver abnormality is seen. No gallstones,
gallbladder wall thickening, or biliary dilatation.

Pancreas: Unremarkable. No pancreatic ductal dilatation or
surrounding inflammatory changes.

Spleen: Normal in size without focal abnormality.

Adrenals/Urinary Tract: Adrenal glands are within normal limits.
Kidneys demonstrate a normal enhancement pattern. Normal excretion
is noted bilaterally. No obstructive changes are noted. Bladder is
decompressed.

Stomach/Bowel: No obstructive or inflammatory changes of the colon
are seen. The appendix is not well visualized. No inflammatory
changes to suggest appendicitis are noted. Small bowel and stomach
appear within normal limits.

Vascular/Lymphatic: Aortic atherosclerosis. No enlarged abdominal or
pelvic lymph nodes.

Reproductive: Uterus and bilateral adnexa are unremarkable.

Other: No abdominal wall hernia or abnormality. No abdominopelvic
ascites.

Musculoskeletal: Degenerative changes of the lumbar spine

Review of the MIP images confirms the above findings.
IMPRESSION: CTA of the chest: No filling defects to suggest pulmonary emboli are
identified.

Patchy ground-glass opacities in the right upper lobe consistent
with the prior [D1] infection.

Significant dilatation of the esophagus with air and ingested food
stuffs. This may be related to reflux but is of uncertain
chronicity.

CT of the abdomen and pelvis: No acute abnormality in the abdomen
pelvis is noted.

## 2020-08-24 IMAGING — CT CT ANGIO CHEST
2 of 6 series · 17 of 46 positions shown · IV contrast (omnipaque)
Comparison: [DATE]

CLINICAL DATA: Nausea and vomiting with abdominal and chest pain,
history of prior [D1] infection

EXAM:
CT ANGIOGRAPHY CHEST
CT ABDOMEN AND PELVIS WITH CONTRAST
TECHNIQUE: Multidetector CT imaging of the chest was performed using the
standard protocol during bolus administration of intravenous
contrast. Multiplanar CT image reconstructions and MIPs were
obtained to evaluate the vascular anatomy. Multidetector CT imaging
of the abdomen and pelvis was performed using the standard protocol
during bolus administration of intravenous contrast.
CONTRAST:  100mL OMNIPAQUE IOHEXOL 350 MG/ML SOLN

[Series 5: thins · axial · 0.71mm/px · z∈[-24,+251]mm · 14 of 303 slices shown]
[im 14/303  lung]
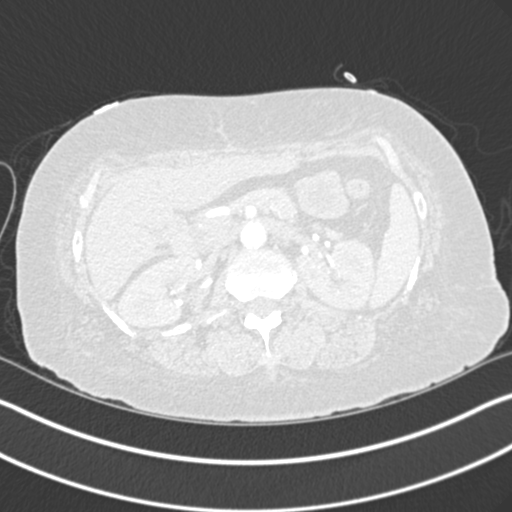
[im 40/303  soft-tissue]
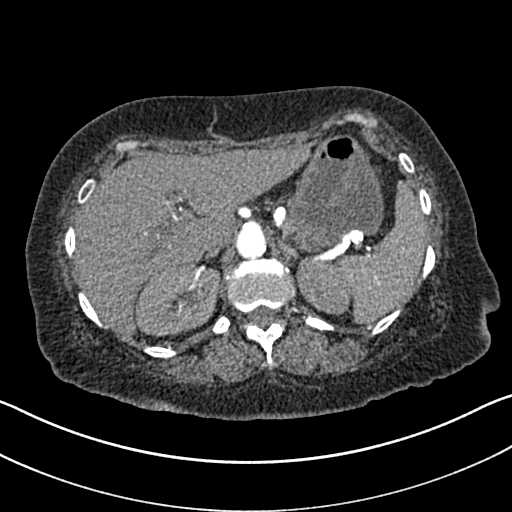
[im 53/303  lung]
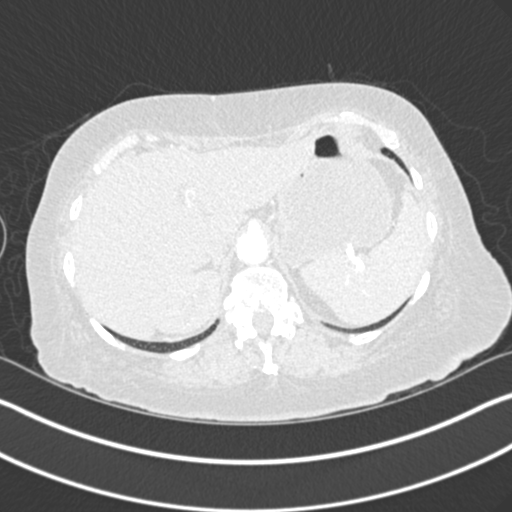
[im 79/303  soft-tissue]
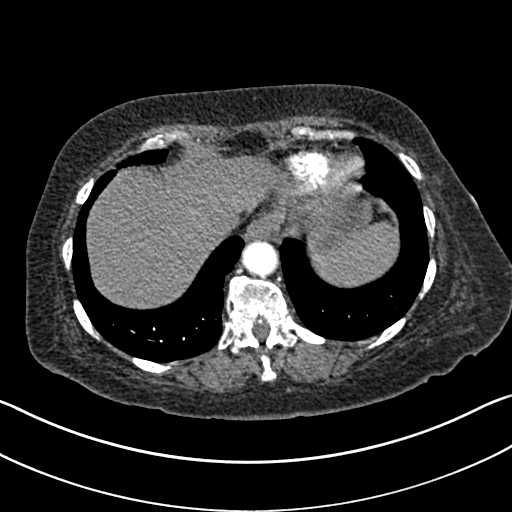
[im 106/303  lung]
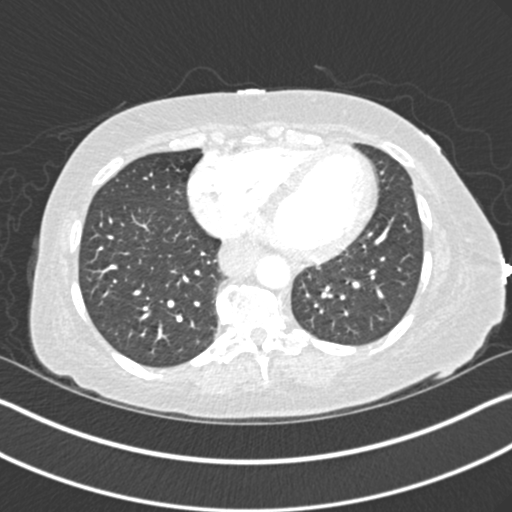
[im 119/303  soft-tissue]
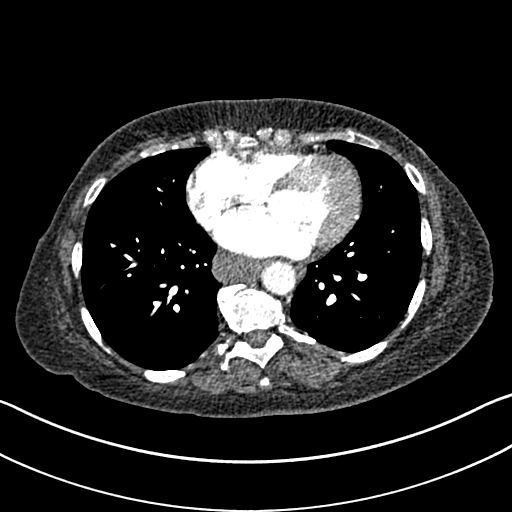
[im 145/303  lung]
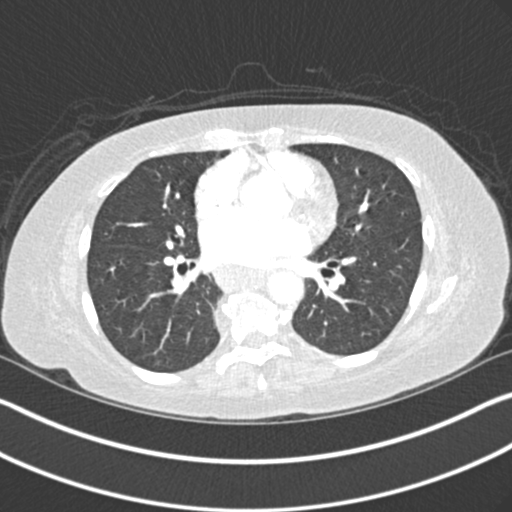
[im 158/303  soft-tissue]
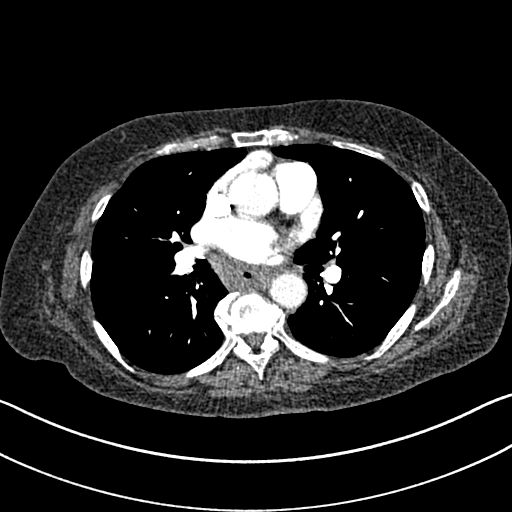
[im 184/303  lung]
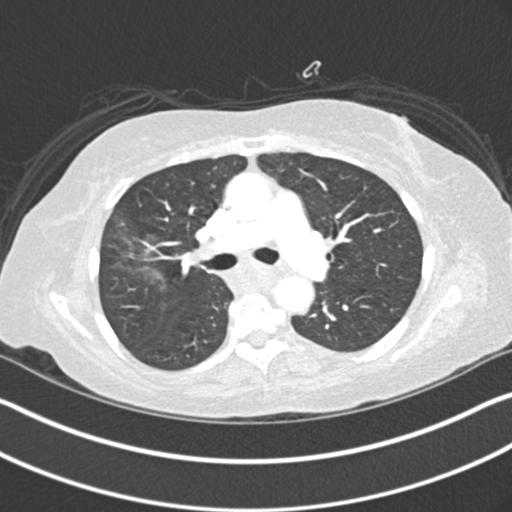
[im 197/303  soft-tissue]
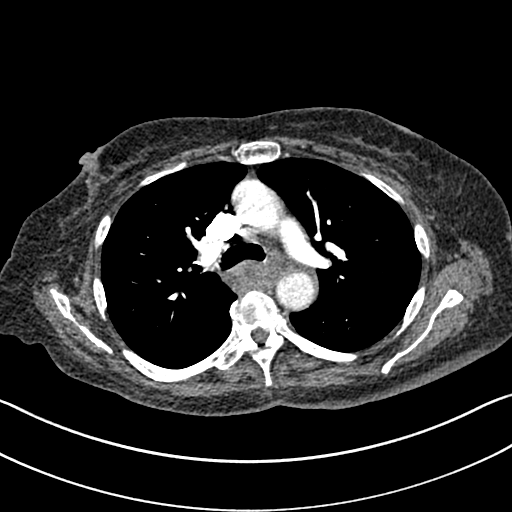
[im 224/303  lung]
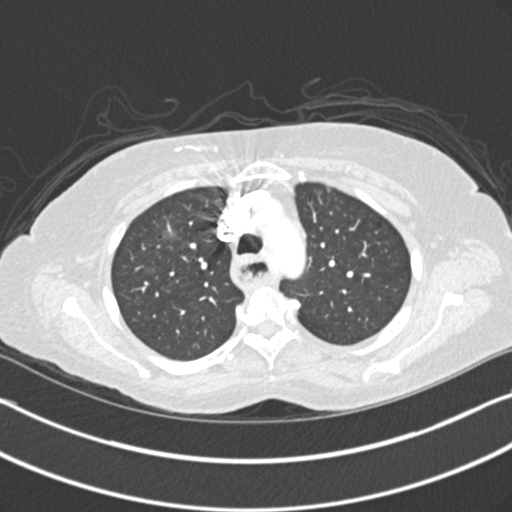
[im 250/303  soft-tissue]
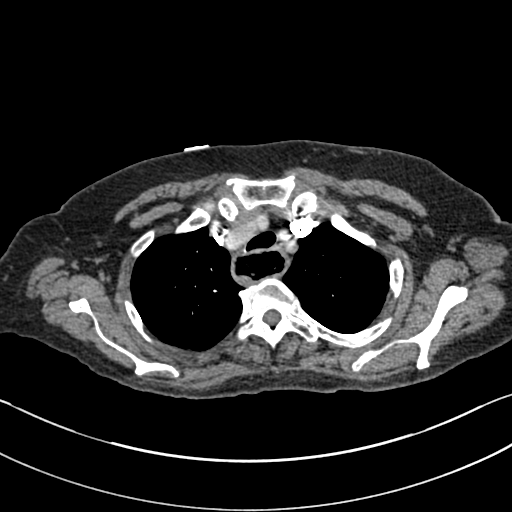
[im 263/303  lung]
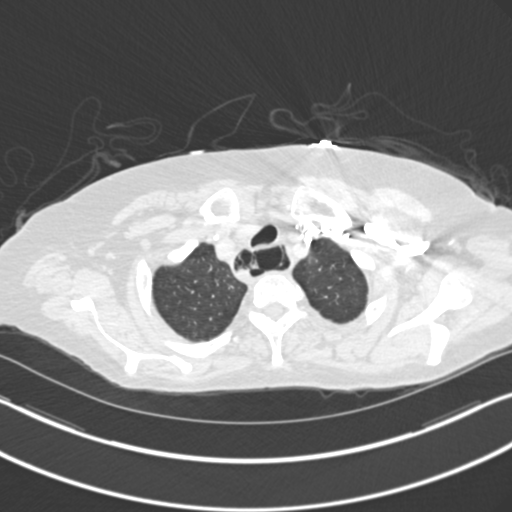
[im 289/303  soft-tissue]
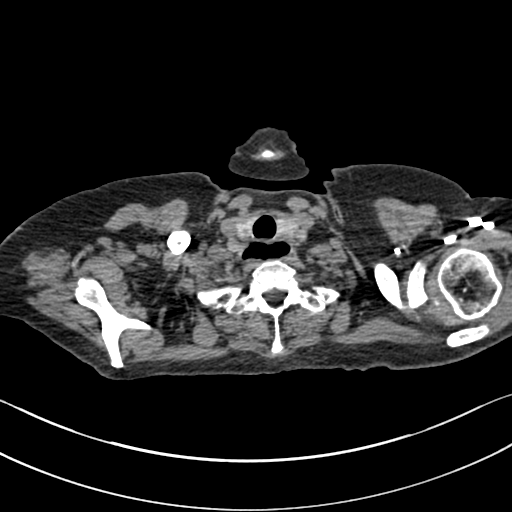

[Series 7: coronal mpr · coronal · 0.62mm/px · 3 of 121 slices shown]
[im 31/121  soft-tissue]
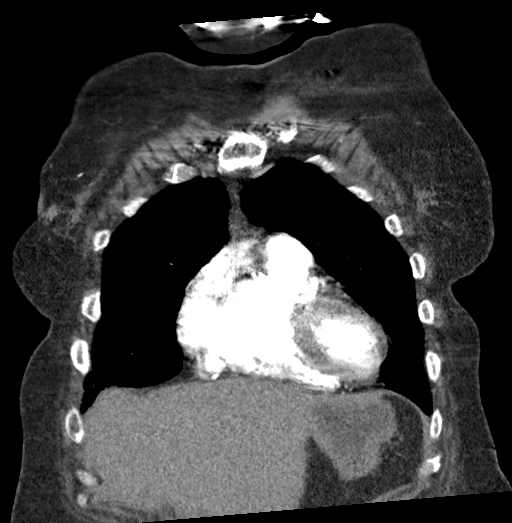
[im 61/121  soft-tissue]
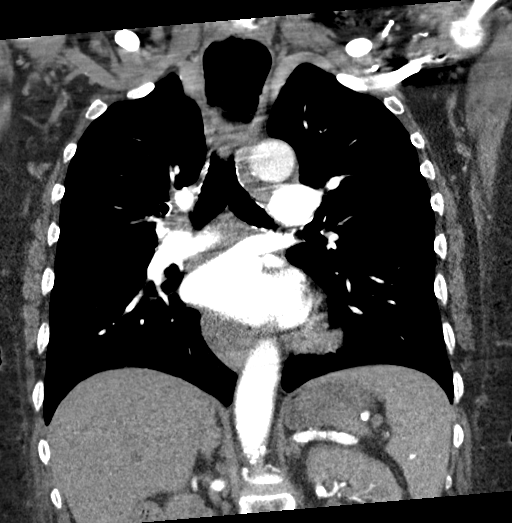
[im 91/121  soft-tissue]
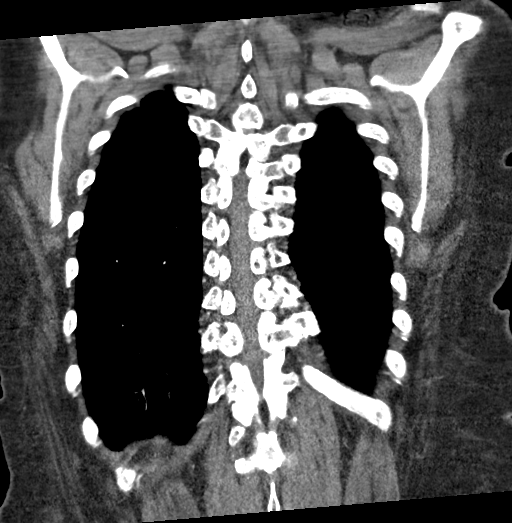

[17 of 46 positions shown; findings below may reference images not displayed]

FINDINGS: CTA CHEST FINDINGS

Cardiovascular: Heart is at the upper limits of normal in size.
Aortic calcifications are noted. Coronary calcifications are seen as
well. No dissection or aneurysmal dilatation is noted. The pulmonary
artery shows a normal branching pattern bilaterally. No filling
defects to suggest pulmonary emboli are noted.

Mediastinum/Nodes: Thoracic inlet is within normal limits. No hilar
or mediastinal adenopathy is noted. The esophagus is significantly
dilated with air and ingested food stuffs. This may also be related
to reflux. No significant wall thickening is noted.

Lungs/Pleura: The lungs are well aerated bilaterally. Some patchy
ground-glass airspace opacities are noted primarily in the right
upper lobe likely related to the prior [D1] infection. No focal
infiltrate or effusion is seen. No sizable parenchymal nodule is
noted.

Musculoskeletal: Degenerative changes of the thoracic spine are
noted. Healed sternal fracture is noted.

Review of the MIP images confirms the above findings.

CT ABDOMEN and PELVIS FINDINGS

Hepatobiliary: No focal liver abnormality is seen. No gallstones,
gallbladder wall thickening, or biliary dilatation.

Pancreas: Unremarkable. No pancreatic ductal dilatation or
surrounding inflammatory changes.

Spleen: Normal in size without focal abnormality.

Adrenals/Urinary Tract: Adrenal glands are within normal limits.
Kidneys demonstrate a normal enhancement pattern. Normal excretion
is noted bilaterally. No obstructive changes are noted. Bladder is
decompressed.

Stomach/Bowel: No obstructive or inflammatory changes of the colon
are seen. The appendix is not well visualized. No inflammatory
changes to suggest appendicitis are noted. Small bowel and stomach
appear within normal limits.

Vascular/Lymphatic: Aortic atherosclerosis. No enlarged abdominal or
pelvic lymph nodes.

Reproductive: Uterus and bilateral adnexa are unremarkable.

Other: No abdominal wall hernia or abnormality. No abdominopelvic
ascites.

Musculoskeletal: Degenerative changes of the lumbar spine

Review of the MIP images confirms the above findings.
IMPRESSION: CTA of the chest: No filling defects to suggest pulmonary emboli are
identified.

Patchy ground-glass opacities in the right upper lobe consistent
with the prior [D1] infection.

Significant dilatation of the esophagus with air and ingested food
stuffs. This may be related to reflux but is of uncertain
chronicity.

CT of the abdomen and pelvis: No acute abnormality in the abdomen
pelvis is noted.

## 2020-08-24 MED ORDER — IOHEXOL 350 MG/ML SOLN
100.0000 mL | Freq: Once | INTRAVENOUS | Status: AC | PRN
  Administered 2020-08-24: 100 mL via INTRAVENOUS

## 2020-08-24 MED ORDER — ONDANSETRON HCL 4 MG/2ML IJ SOLN
4.0000 mg | INTRAMUSCULAR | Status: AC
  Administered 2020-08-25: 4 mg via INTRAVENOUS
  Filled 2020-08-24: qty 2

## 2020-08-24 MED ORDER — SODIUM CHLORIDE 0.9 % IV BOLUS
1000.0000 mL | Freq: Once | INTRAVENOUS | Status: AC
  Administered 2020-08-24: 1000 mL via INTRAVENOUS

## 2020-08-24 MED ORDER — MAGNESIUM SULFATE 2 GM/50ML IV SOLN
2.0000 g | Freq: Once | INTRAVENOUS | Status: AC
  Administered 2020-08-25: 2 g via INTRAVENOUS
  Filled 2020-08-24: qty 50

## 2020-08-24 MED ORDER — POTASSIUM CHLORIDE CRYS ER 20 MEQ PO TBCR
40.0000 meq | EXTENDED_RELEASE_TABLET | Freq: Once | ORAL | Status: DC
  Filled 2020-08-24: qty 2

## 2020-08-24 NOTE — ED Triage Notes (Signed)
EMS brings pt in from home for c/o abd pain accomp by N/V; family reports recent alt mental status; seen mo ago for hypokalemia; has not taken any meds for last 2wks "because she was only given a 2wk supply"; placed on o2 at 2l/min for sat 80%

## 2020-08-24 NOTE — ED Triage Notes (Signed)
Pt in with co congestion and cough since yesterday. Also co vomiting and generalized abd pain since yesterday.

## 2020-08-24 NOTE — ED Triage Notes (Incomplete Revision)
Emergency Medicine Provider Triage Evaluation Note  Donna Aguirre , a 65 y.o. female  was evaluated in triage.  Pt complains of multiple complaints:  nasal congestion, drainage down the back of her throat, cough, chest pain, abdominal pain, N/V.  Review of Systems  Positive: chest pain (when coughing), abdominal pain (lower), N/V, congestion Negative: dyspnea, fever  Physical Exam  BP (!) 192/107 (BP Location: Left Arm)   Pulse 86   Temp 97.6 F (36.4 C) (Oral)   Resp 20   Ht 1.626 m (5\' 4" )   Wt 66.2 kg   SpO2 100%   BMI 25.06 kg/m  Gen:   Awake, no respiratory distress, questionable intoxication HEENT:  Atraumatic.  Congested, sniffing, clearing throat, spitting into bag. Resp:  Normal effort, no retractions, no wheezing, no observed cough Cardiac:  Normal rate, good peripheral perfusion Abd:   Nondistended, mild epigastric TTP, no localized peritonitis. MSK:   Moves extremities without difficulty. Neuro:  Slurred speech, difficult to understand, but moving all four extremities.  Medical Decision Making  Medically screening exam initiated at 11:23 PM.  Appropriate orders placed.  Donna Aguirre was informed that the remainder of the evaluation will be completed by another provider, this initial triage assessment does not replace that evaluation, and the importance of remaining in the ED until their evaluation is complete.  Clinical Impression  Multiple electrolyte abnormalities. Upper respiratory symptoms, positive for COVID+ last month.  Persistent symptoms, but also with new GI symptoms, heavy phlegm, hx of alcohol abuse, vomiting vs spitting out phlegm in triage. ***  Potassium 40 meq, NS 1L bolus, Zofran 4 mg IV, CTA chest r/o PE, CT abd/pelvis eval for SBO and pancreatitis.  OF NOTE:  Triage note states that for EMS the patient had RA sat of 80%, but she has been 99-100% spO2 while in the ED, and she is not complaining of dyspnea . She is not currently on oxygen and is in  no respiratory distress.

## 2020-08-24 NOTE — ED Triage Notes (Addendum)
Emergency Medicine Provider Triage Evaluation Note  Donna Aguirre , a 65 y.o. female  was evaluated in triage.  Pt complains of multiple complaints:  nasal congestion, drainage down the back of her throat, cough, chest pain, abdominal pain, N/V.  Review of Systems  Positive: chest pain (when coughing), abdominal pain (lower), N/V, congestion Negative: dyspnea, fever  Physical Exam  BP (!) 192/107 (BP Location: Left Arm)   Pulse 86   Temp 97.6 F (36.4 C) (Oral)   Resp 20   Ht 1.626 m (5\' 4" )   Wt 66.2 kg   SpO2 100%   BMI 25.06 kg/m  Gen:   Awake, no respiratory distress, questionable intoxication HEENT:  Atraumatic.  Congested, sniffing, clearing throat, spitting into bag. Resp:  Normal effort, no retractions, no wheezing, no observed cough Cardiac:  Normal rate, good peripheral perfusion Abd:   Nondistended, mild epigastric TTP, no localized peritonitis. MSK:   Moves extremities without difficulty. Neuro:  Slurred speech, difficult to understand, but moving all four extremities.  Medical Decision Making  Medically screening exam initiated at 11:23 PM.  Appropriate orders placed.  Donna Aguirre was informed that the remainder of the evaluation will be completed by another provider, this initial triage assessment does not replace that evaluation, and the importance of remaining in the ED until their evaluation is complete.  Clinical Impression  Multiple electrolyte abnormalities. Upper respiratory symptoms, positive for COVID+ last month.  Persistent symptoms, but also with new GI symptoms, heavy phlegm, hx of alcohol abuse, vomiting vs spitting out phlegm in triage. Scattered history, main complaint is nasal congestion.  Potassium 40 meq, NS 1L bolus, Zofran 4 mg IV, CTA chest r/o PE, CT abd/pelvis eval for SBO and pancreatitis.  OF NOTE:  Triage note states that for EMS the patient had RA sat of 80%, but she has been 99-100% spO2 while in the ED, and she is not complaining of  dyspnea . She is not currently on oxygen and is in no respiratory distress.   Hinda Kehr, MD 08/25/20 [phone]

## 2020-08-25 ENCOUNTER — Emergency Department: Payer: Medicaid Other

## 2020-08-25 ENCOUNTER — Encounter: Payer: Self-pay | Admitting: Family Medicine

## 2020-08-25 ENCOUNTER — Inpatient Hospital Stay: Payer: Medicaid Other

## 2020-08-25 ENCOUNTER — Inpatient Hospital Stay
Admission: EM | Admit: 2020-08-25 | Discharge: 2020-08-27 | DRG: 078 | Disposition: A | Discharge status: Home / Self Care | Payer: Medicaid Other | Attending: Family Medicine | Admitting: Family Medicine

## 2020-08-25 DIAGNOSIS — R197 Diarrhea, unspecified: Secondary | ICD-10-CM | POA: Diagnosis not present

## 2020-08-25 DIAGNOSIS — E876 Hypokalemia: Secondary | ICD-10-CM

## 2020-08-25 DIAGNOSIS — E860 Dehydration: Secondary | ICD-10-CM | POA: Diagnosis present

## 2020-08-25 DIAGNOSIS — I10 Essential (primary) hypertension: Secondary | ICD-10-CM | POA: Diagnosis present

## 2020-08-25 DIAGNOSIS — Z8249 Family history of ischemic heart disease and other diseases of the circulatory system: Secondary | ICD-10-CM | POA: Diagnosis not present

## 2020-08-25 DIAGNOSIS — Z20822 Contact with and (suspected) exposure to covid-19: Secondary | ICD-10-CM | POA: Diagnosis present

## 2020-08-25 DIAGNOSIS — Z79899 Other long term (current) drug therapy: Secondary | ICD-10-CM | POA: Diagnosis not present

## 2020-08-25 DIAGNOSIS — E861 Hypovolemia: Secondary | ICD-10-CM | POA: Diagnosis present

## 2020-08-25 DIAGNOSIS — R1310 Dysphagia, unspecified: Secondary | ICD-10-CM | POA: Diagnosis not present

## 2020-08-25 DIAGNOSIS — R0982 Postnasal drip: Secondary | ICD-10-CM | POA: Diagnosis present

## 2020-08-25 DIAGNOSIS — K219 Gastro-esophageal reflux disease without esophagitis: Secondary | ICD-10-CM | POA: Diagnosis present

## 2020-08-25 DIAGNOSIS — E878 Other disorders of electrolyte and fluid balance, not elsewhere classified: Secondary | ICD-10-CM | POA: Diagnosis not present

## 2020-08-25 DIAGNOSIS — I160 Hypertensive urgency: Secondary | ICD-10-CM

## 2020-08-25 DIAGNOSIS — I440 Atrioventricular block, first degree: Secondary | ICD-10-CM | POA: Diagnosis present

## 2020-08-25 DIAGNOSIS — K222 Esophageal obstruction: Secondary | ICD-10-CM | POA: Diagnosis present

## 2020-08-25 DIAGNOSIS — E871 Hypo-osmolality and hyponatremia: Secondary | ICD-10-CM | POA: Diagnosis present

## 2020-08-25 DIAGNOSIS — Z8616 Personal history of COVID-19: Secondary | ICD-10-CM | POA: Diagnosis not present

## 2020-08-25 DIAGNOSIS — R4781 Slurred speech: Secondary | ICD-10-CM | POA: Diagnosis present

## 2020-08-25 DIAGNOSIS — R400 Somnolence: Secondary | ICD-10-CM | POA: Diagnosis present

## 2020-08-25 DIAGNOSIS — E119 Type 2 diabetes mellitus without complications: Secondary | ICD-10-CM | POA: Diagnosis present

## 2020-08-25 DIAGNOSIS — R112 Nausea with vomiting, unspecified: Secondary | ICD-10-CM | POA: Diagnosis not present

## 2020-08-25 DIAGNOSIS — G9340 Encephalopathy, unspecified: Secondary | ICD-10-CM

## 2020-08-25 DIAGNOSIS — I674 Hypertensive encephalopathy: Secondary | ICD-10-CM | POA: Diagnosis present

## 2020-08-25 DIAGNOSIS — R778 Other specified abnormalities of plasma proteins: Secondary | ICD-10-CM

## 2020-08-25 DIAGNOSIS — I161 Hypertensive emergency: Secondary | ICD-10-CM | POA: Diagnosis present

## 2020-08-25 DIAGNOSIS — F17210 Nicotine dependence, cigarettes, uncomplicated: Secondary | ICD-10-CM | POA: Diagnosis present

## 2020-08-25 DIAGNOSIS — F1010 Alcohol abuse, uncomplicated: Secondary | ICD-10-CM | POA: Diagnosis present

## 2020-08-25 LAB — URINALYSIS, COMPLETE (UACMP) WITH MICROSCOPIC
Bacteria, UA: NONE SEEN
Bilirubin Urine: NEGATIVE
Glucose, UA: NEGATIVE mg/dL
Ketones, ur: 20 mg/dL — AB
Leukocytes,Ua: NEGATIVE
Nitrite: NEGATIVE
Protein, ur: NEGATIVE mg/dL
Specific Gravity, Urine: 1.009 (ref 1.005–1.030)
pH: 8 (ref 5.0–8.0)

## 2020-08-25 LAB — BLOOD GAS, ARTERIAL
Acid-Base Excess: 0 mmol/L (ref 0.0–2.0)
Bicarbonate: 24.8 mmol/L (ref 20.0–28.0)
O2 Saturation: 97.5 %
Patient temperature: 37
pCO2 arterial: 40 mmHg (ref 32.0–48.0)
pH, Arterial: 7.4 (ref 7.350–7.450)
pO2, Arterial: 96 mmHg (ref 83.0–108.0)

## 2020-08-25 LAB — MAGNESIUM: Magnesium: 2.2 mg/dL (ref 1.7–2.4)

## 2020-08-25 LAB — URINE DRUG SCREEN, QUALITATIVE (ARMC ONLY)
Amphetamines, Ur Screen: NOT DETECTED
Barbiturates, Ur Screen: NOT DETECTED
Benzodiazepine, Ur Scrn: NOT DETECTED
Cannabinoid 50 Ng, Ur ~~LOC~~: NOT DETECTED
Cocaine Metabolite,Ur ~~LOC~~: NOT DETECTED
MDMA (Ecstasy)Ur Screen: NOT DETECTED
Methadone Scn, Ur: NOT DETECTED
Opiate, Ur Screen: NOT DETECTED
Phencyclidine (PCP) Ur S: NOT DETECTED
Tricyclic, Ur Screen: NOT DETECTED

## 2020-08-25 LAB — RESP PANEL BY RT-PCR (FLU A&B, COVID) ARPGX2
Influenza A by PCR: NEGATIVE
Influenza B by PCR: NEGATIVE
SARS Coronavirus 2 by RT PCR: NEGATIVE

## 2020-08-25 LAB — ETHANOL: Alcohol, Ethyl (B): <10 mg/dL (ref <10)

## 2020-08-25 LAB — AMMONIA: Ammonia: 24 umol/L (ref 9–35)

## 2020-08-25 LAB — TROPONIN I (HIGH SENSITIVITY)
Troponin I (High Sensitivity): 44 ng/L — ABNORMAL HIGH (ref <18)
Troponin I (High Sensitivity): 77 ng/L — ABNORMAL HIGH (ref <18)

## 2020-08-25 LAB — PROCALCITONIN: Procalcitonin: <0.1 ng/mL

## 2020-08-25 LAB — CBG MONITORING, ED: Glucose-Capillary: 100 mg/dL — ABNORMAL HIGH (ref 70–99)

## 2020-08-25 LAB — HIV ANTIBODY (ROUTINE TESTING W REFLEX): HIV Screen 4th Generation wRfx: NONREACTIVE

## 2020-08-25 IMAGING — CT CT HEAD W/O CM
3 series · 16 of 46 positions shown, 19 images · non-contrast
Comparison: None.

CLINICAL DATA: Altered mental status

EXAM:
CT HEAD WITHOUT CONTRAST
TECHNIQUE: Contiguous axial images were obtained from the base of the skull
through the vertex without intravenous contrast.

[Series 2: head (person_name) (person_name) · axial · 0.39mm/px · z∈[-176,-56]mm · 10 of 29 slices shown, 13 images]
[im 3/29  brain]
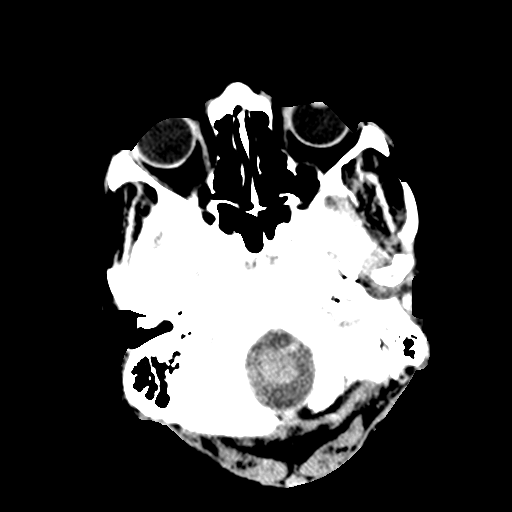
[im 3/29  bone]
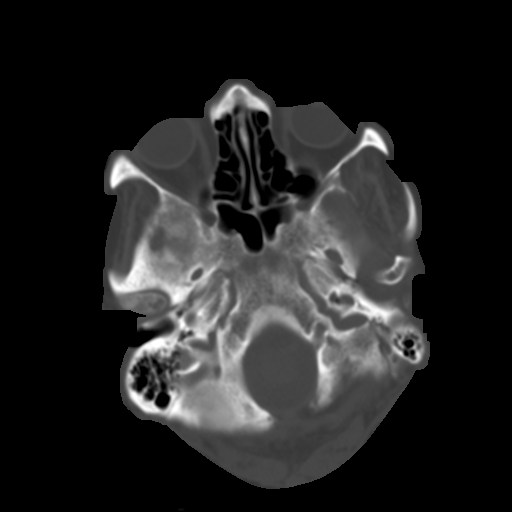
[im 6/29  brain]
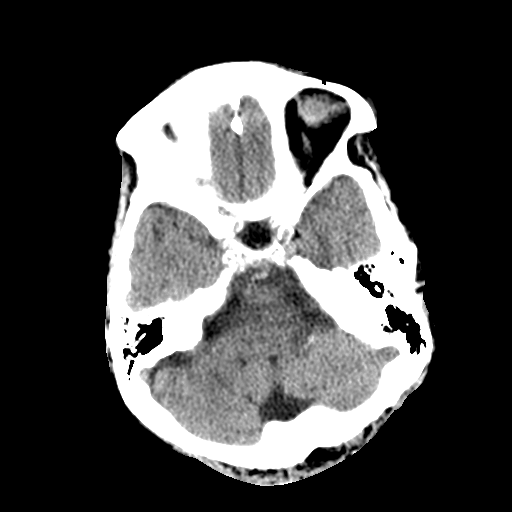
[im 8/29  brain]
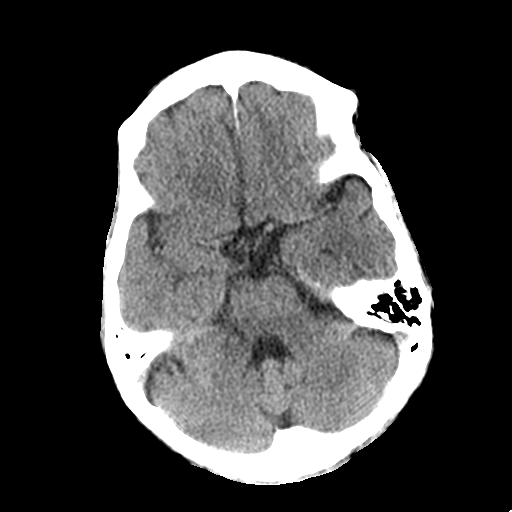
[im 11/29  brain]
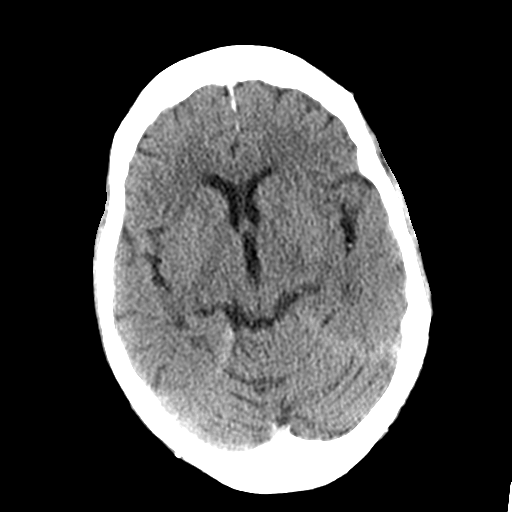
[im 14/29  brain]
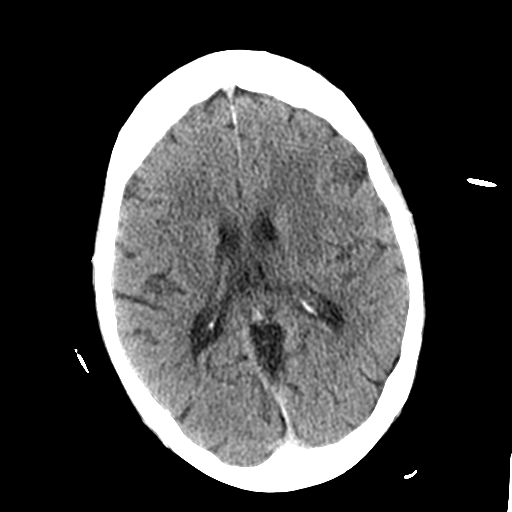
[im 14/29  bone]
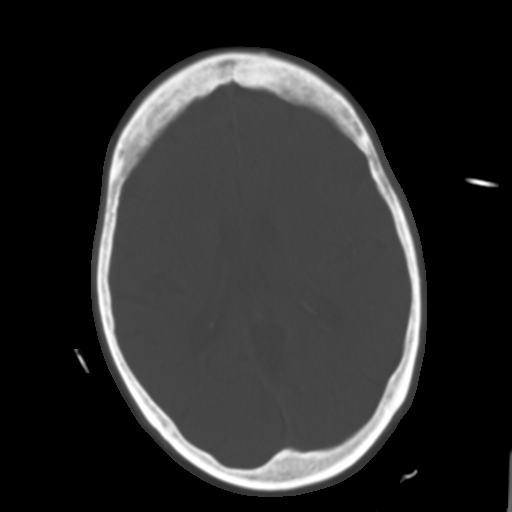
[im 16/29  brain]
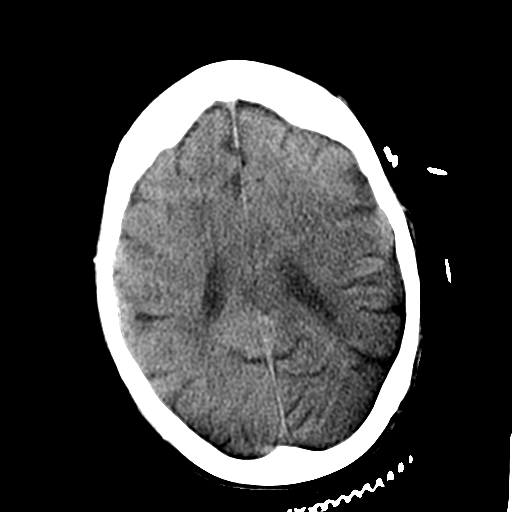
[im 19/29  brain]
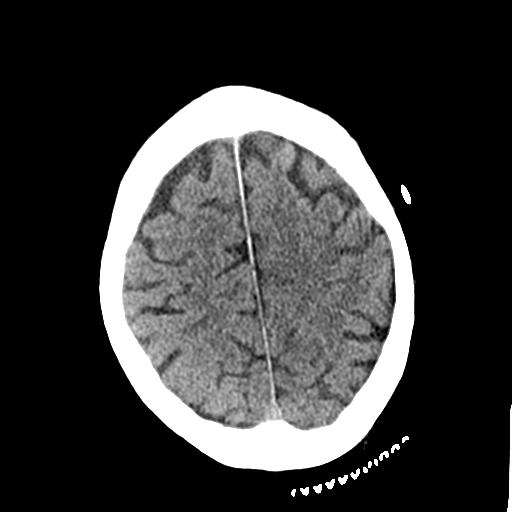
[im 22/29  brain]
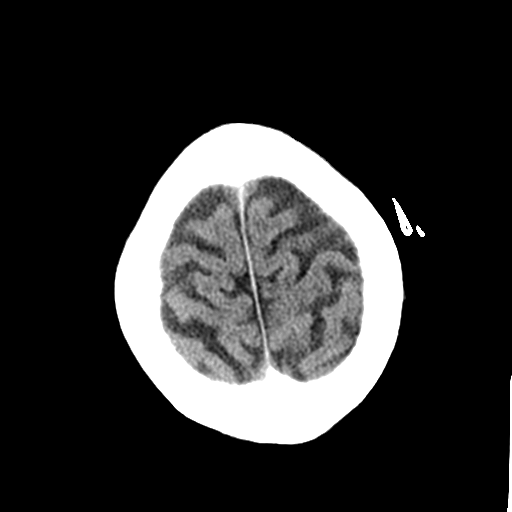
[im 24/29  brain]
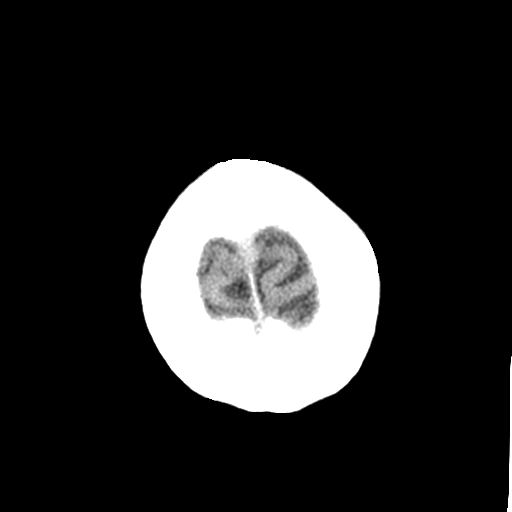
[im 24/29  bone]
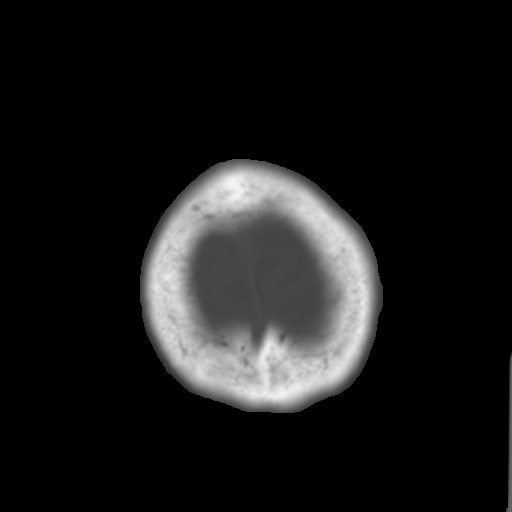
[im 27/29  brain]
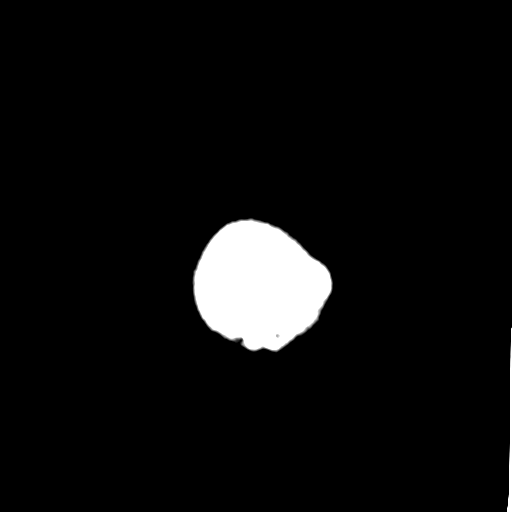

[Series 4: coronal soft tissue · coronal · 0.31mm/px · 3 of 63 slices shown]
[im 21/63  brain]
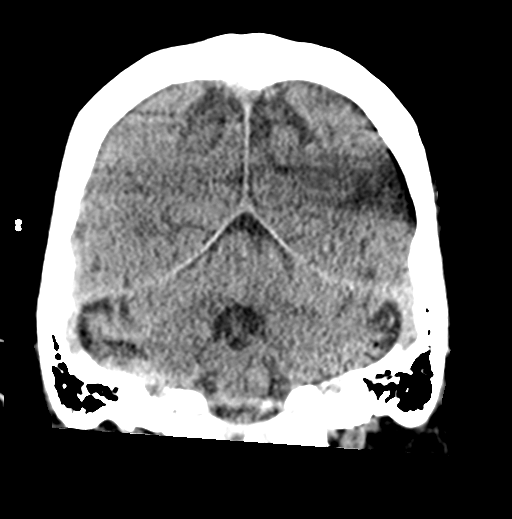
[im 28/63  brain]
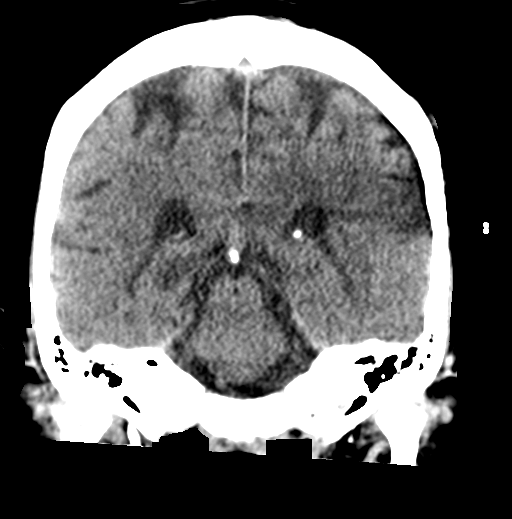
[im 35/63  brain]
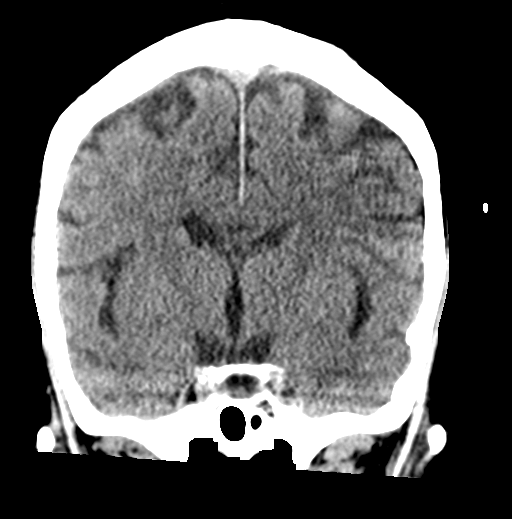

[Series 5: sagittal soft tissue · sagittal · 0.31mm/px · 3 of 52 slices shown]
[im 18/52  brain]
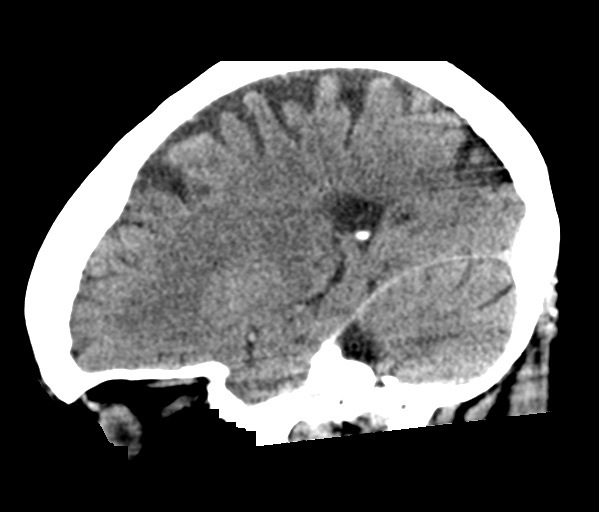
[im 26/52  brain]
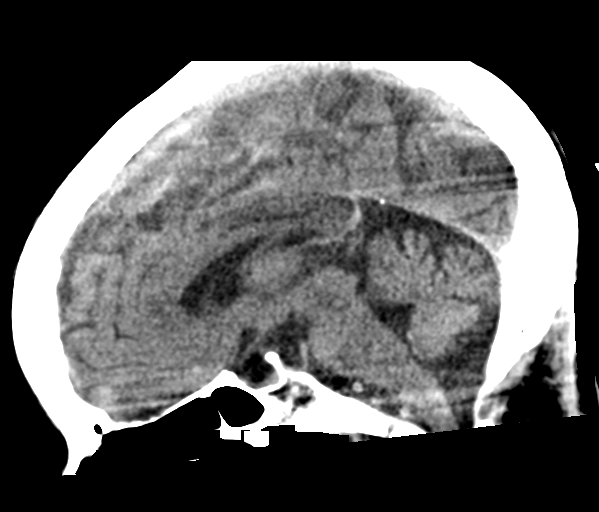
[im 35/52  brain]
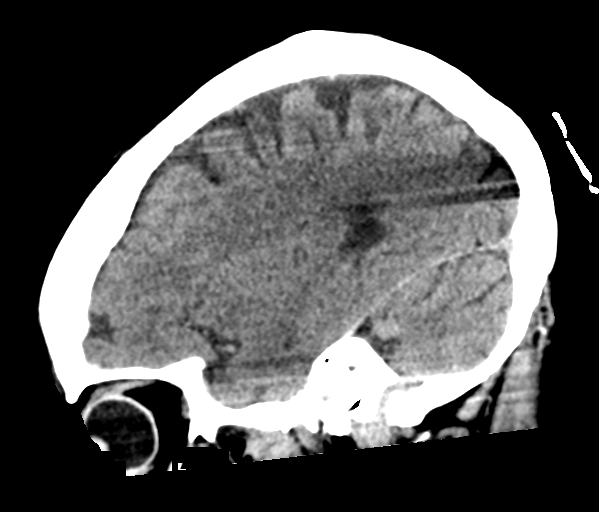

[16 of 46 positions shown; findings below may reference images not displayed]

FINDINGS: Brain: Normal anatomic configuration. No abnormal intra or
extra-axial mass lesion or fluid collection. No abnormal mass effect
or midline shift. No evidence of acute intracranial hemorrhage or
infarct. Ventricular size is normal. Cerebellum unremarkable.

Vascular: Moderate atherosclerotic calcification within the carotid
siphons. No asymmetric hyperdense vasculature at the skull base.

Skull: Intact

Sinuses/Orbits: Paranasal sinuses are clear. Orbits are
unremarkable.

Other: Mastoid air cells and middle ear cavities are clear.
IMPRESSION: No acute intracranial abnormality.

## 2020-08-25 IMAGING — RF DG ESOPHAGUS
11 series · 14 of 15 positions shown · non-contrast
Comparison: CT [DATE].

CLINICAL DATA: Dysphagia. History of esophagitis and esophageal
stricture.

EXAM:
ESOPHOGRAM/BARIUM SWALLOW
TECHNIQUE: Single contrast examination was performed using  thin barium.
FLUOROSCOPY TIME:  Fluoroscopy Time:  4 seconds
Radiation Exposure Index (if provided by the fluoroscopic device):
15.8 mGy

[Series 1: fluoro_barium 2fps_bw · 0.18mm/px · 4 of 9 frames shown (1 of 11)]
[frame 2/9]
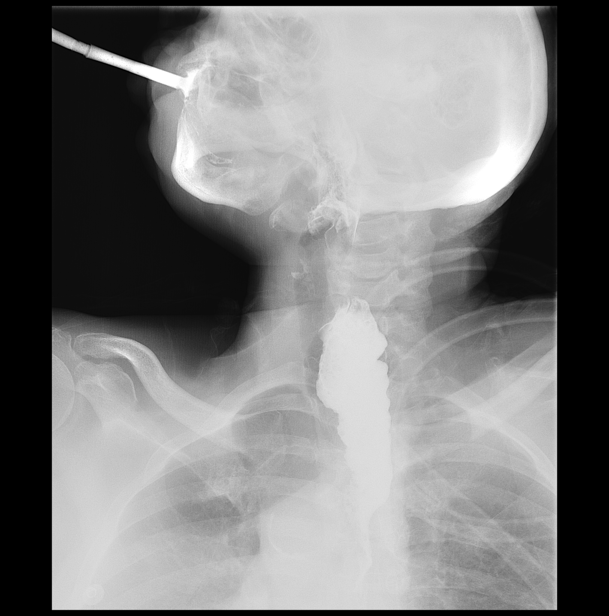
[frame 5/9]
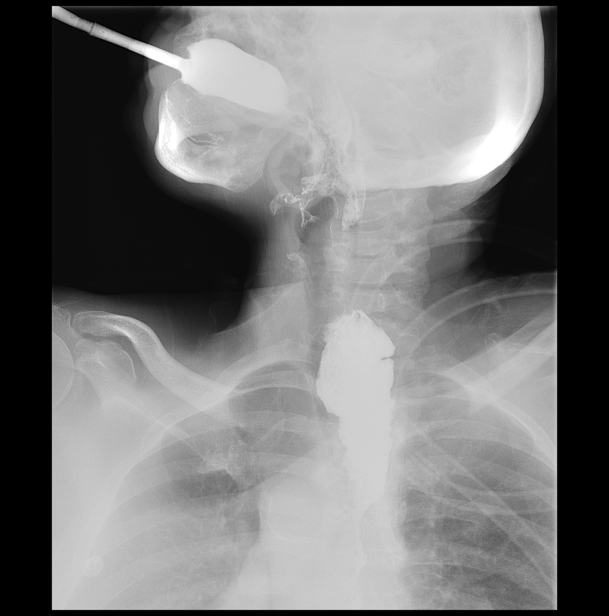
[frame 8/9]
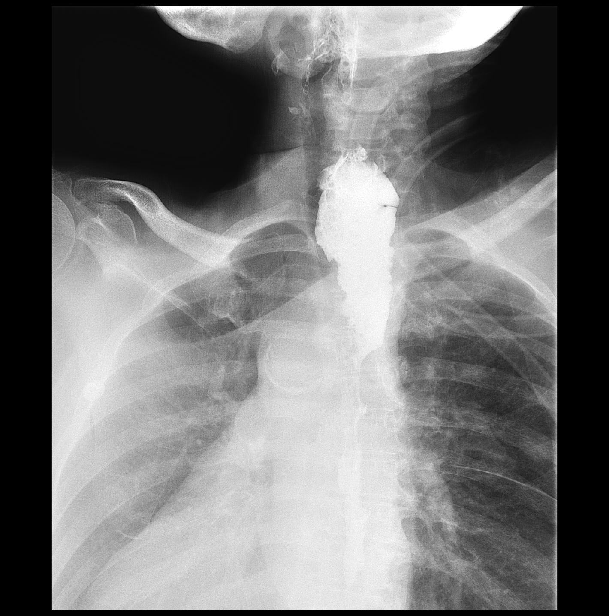
[frame 9/9]
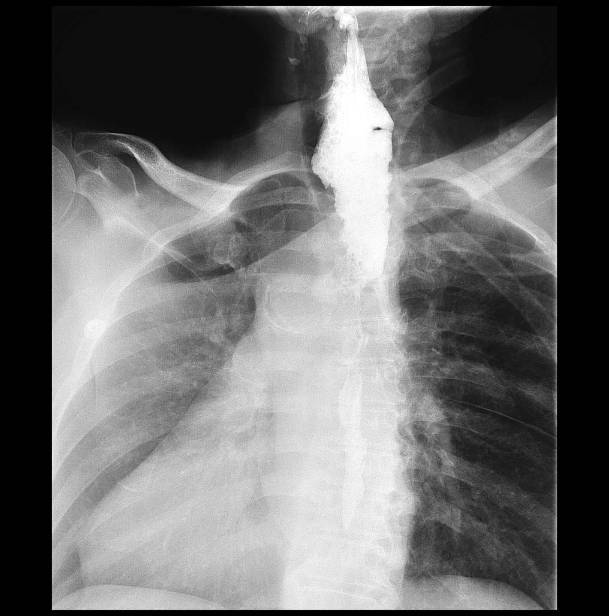

[Series 2: fluoro_barium 2fps_bw · 0.18mm/px · 1 of 1 slices shown (2 of 11)]
[im 1/1]
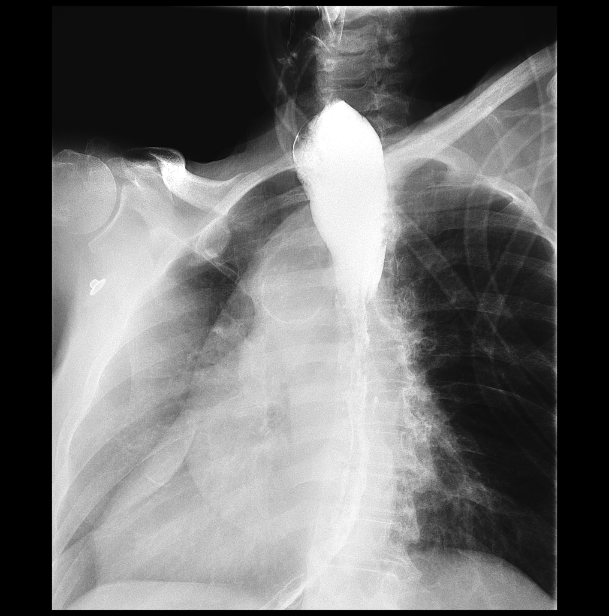

[Series 3: fluoro_barium 2fps_bw · 0.18mm/px · 1 of 1 slices shown (3 of 11)]
[im 1/1]
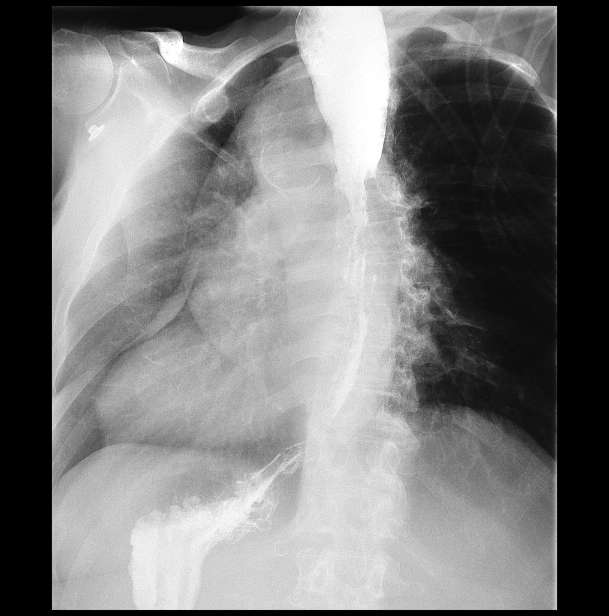

[Series 4: fluoro_barium 2fps_bw · 0.18mm/px · 1 of 1 slices shown (4 of 11)]
[im 1/1]
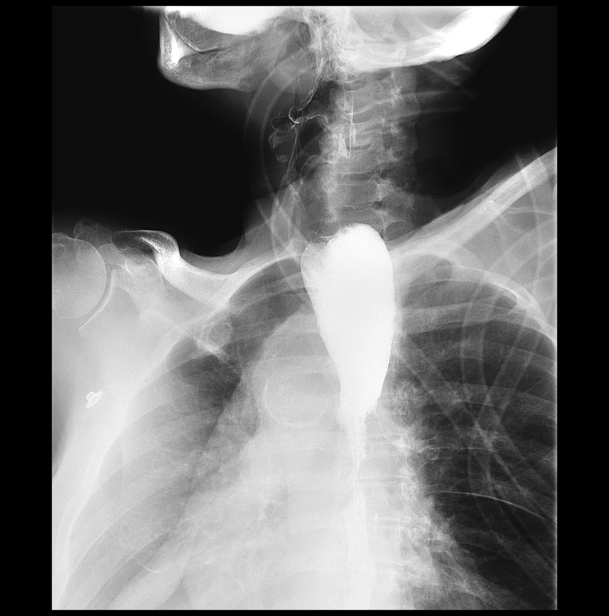

[Series 5: fluoro_barium 2fps_bw · 0.18mm/px · 1 of 2 frames shown (5 of 11)]
[frame 2/2]
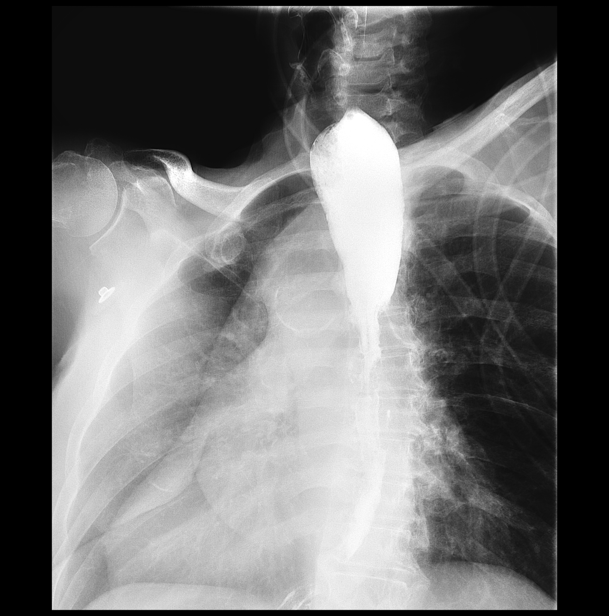

[Series 6: fluoro_barium 2fps_bw · 0.18mm/px · 1 of 1 slices shown (6 of 11)]
[im 1/1]
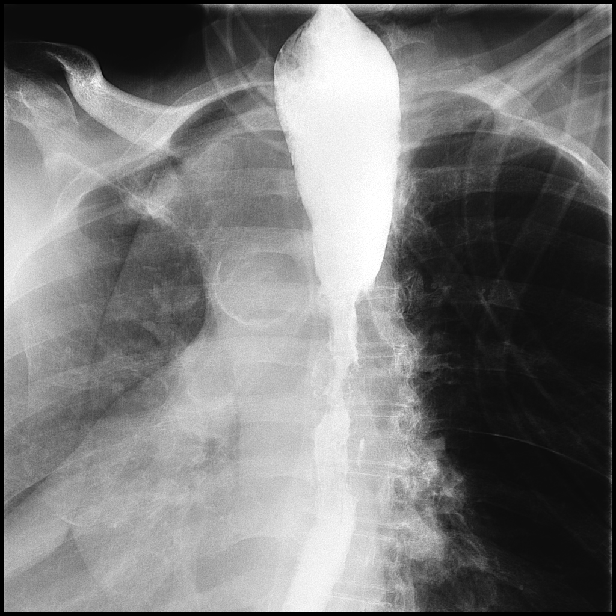

[Series 7: fluoro_barium 2fps_bw · 0.18mm/px · 1 of 1 slices shown (7 of 11)]
[im 1/1]
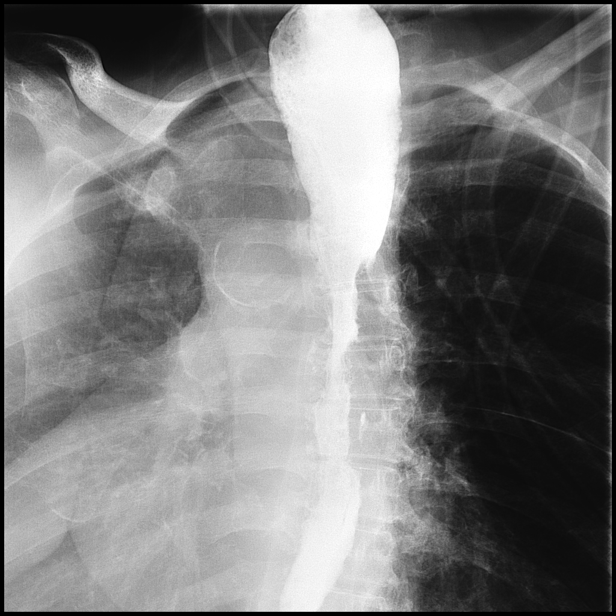

[Series 8: fluoro_barium 2fps_bw · 0.18mm/px · 1 of 1 slices shown (8 of 11)]
[im 1/1]
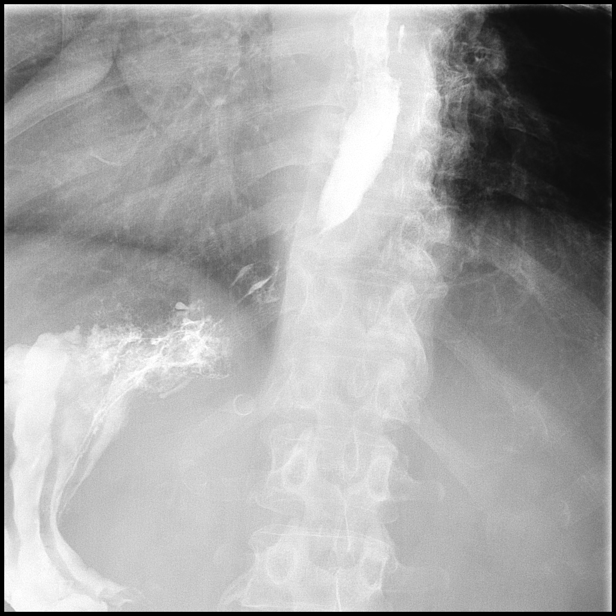

[Series 9: fluoro_barium 2fps_bw · 0.18mm/px · 1 of 1 slices shown (9 of 11)]
[im 1/1]
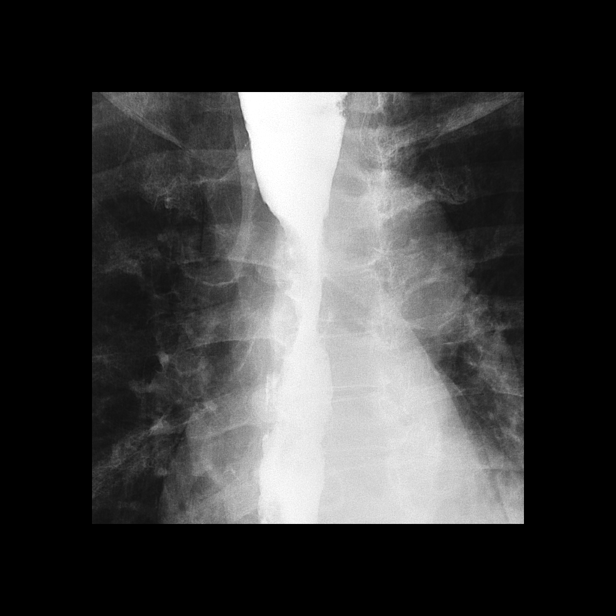

[Series 10: fluoro_barium 2fps_bw · 0.18mm/px · 1 of 1 slices shown (10 of 11)]
[im 1/1]
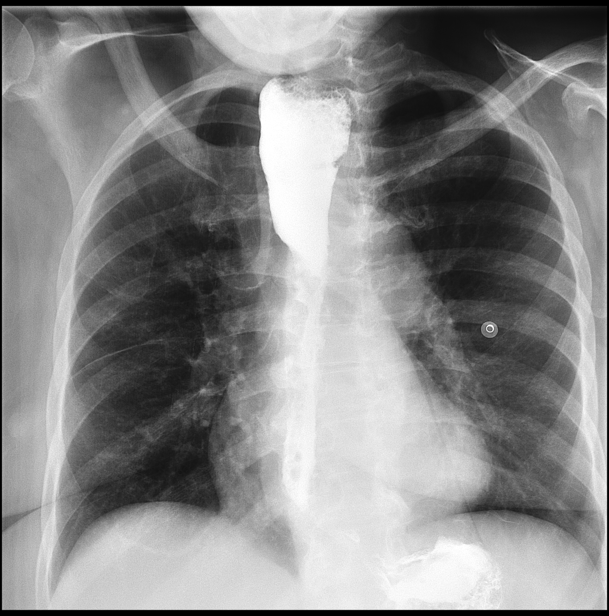

[Series 11: fluoro_barium 2fps_bw · 0.18mm/px · 1 of 1 slices shown (11 of 11)]
[im 1/1]
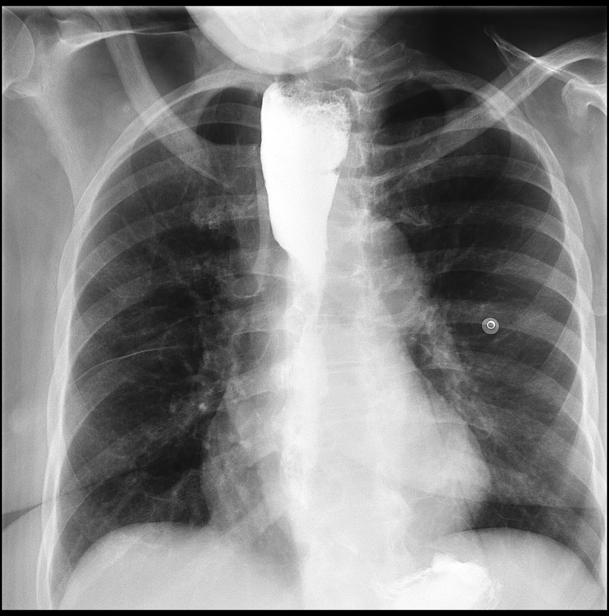

[14 of 15 positions shown; findings below may reference images not displayed]

FINDINGS: Limited exam due to the patient's clinical condition. The cervical
esophagus is grossly unremarkable. Upper thoracic esophagus is again
noted to be dilated and tapers at the level of the upper thoracic
esophagus at which level there is irregular narrowing. Esophageal
malignancy cannot be excluded. Barium empties into the stomach. No
reflux was demonstrated.
IMPRESSION: Limited exam due to patient's clinical condition.

Irregular narrowing of the upper thoracic esophagus is noted.
Esophageal malignancy cannot be excluded. Associated proximal
esophageal distention is again noted.

## 2020-08-25 IMAGING — MR MR HEAD W/O CM
9 of 11 series · 35 of 48 positions shown · non-contrast
Comparison: CT head without contrast [DATE]

CLINICAL DATA: Neuro deficit, acute, stroke suspected. Altered
mental status. Confusion. Elevated troponin.

EXAM:
MRI HEAD WITHOUT CONTRAST
TECHNIQUE: Multiplanar, multiecho pulse sequences of the brain and surrounding
structures were obtained without intravenous contrast.

[Series 9: ax dwi_tracew · axial · 3.0mm · 0.65mm/px · z∈[-169,-26]mm · 6 of 48 slices shown]
[im 1/48]
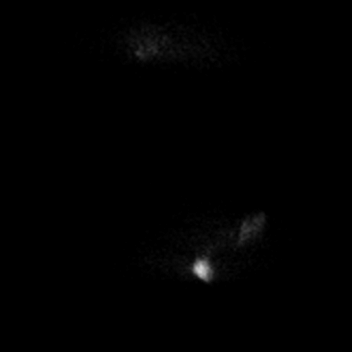
[im 10/48]
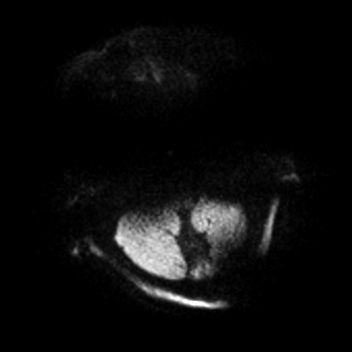
[im 19/48]
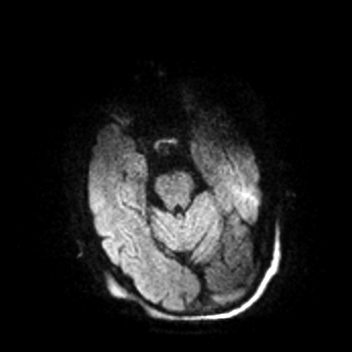
[im 29/48]
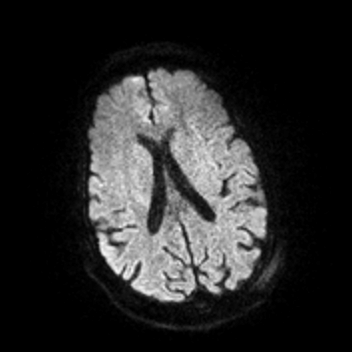
[im 38/48]
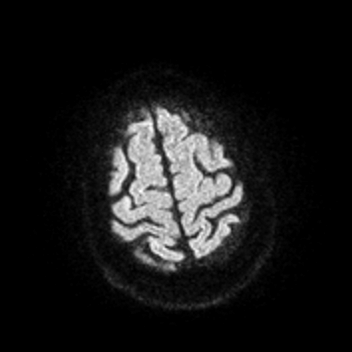
[im 48/48]
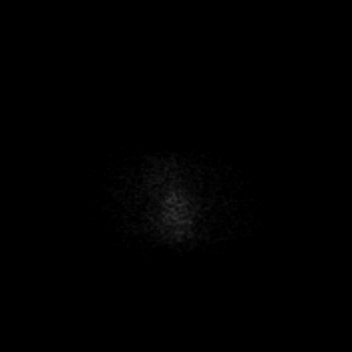

[Series 10: ax dwi_adc · axial · 3.0mm · 0.65mm/px · z∈[-169,-26]mm · 5 of 48 slices shown]
[im 1/48]
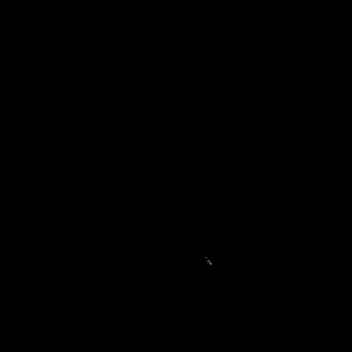
[im 12/48]
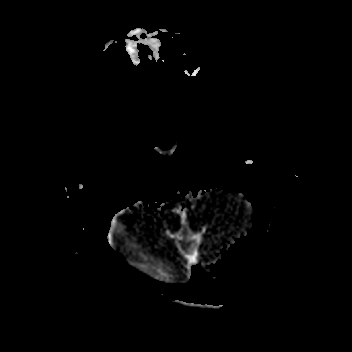
[im 24/48]
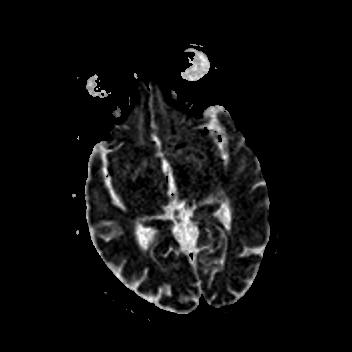
[im 36/48]
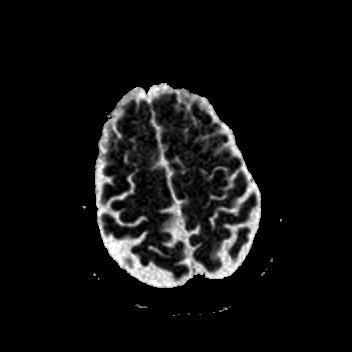
[im 48/48]
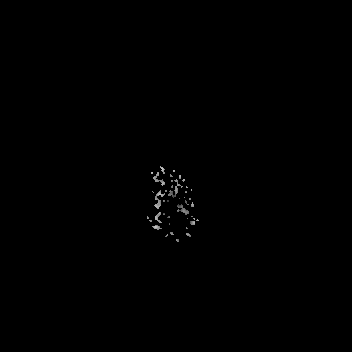

[Series 11: cor dwi_tracew · coronal · 5.0mm · 0.65mm/px · 4 of 40 slices shown]
[im 1/40]
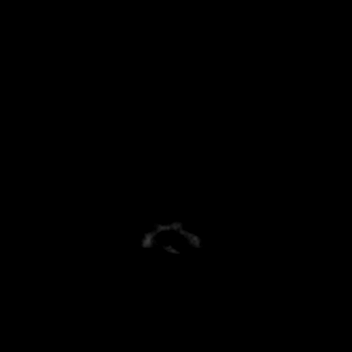
[im 14/40]
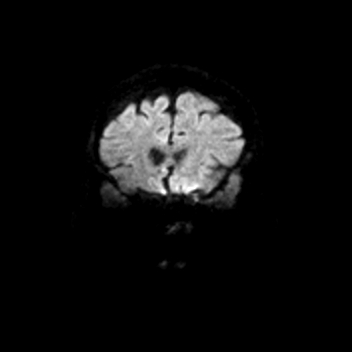
[im 27/40]
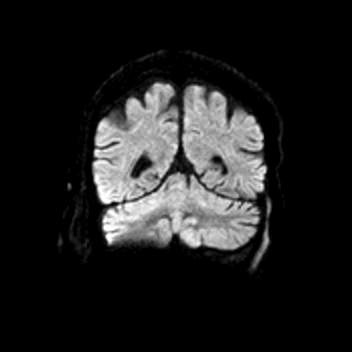
[im 40/40]
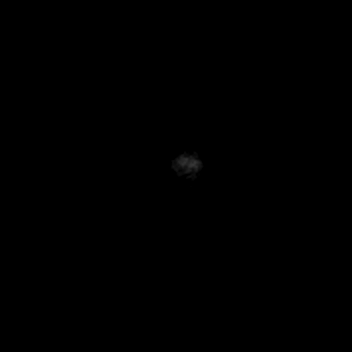

[Series 12: cor dwi_adc · coronal · 5.0mm · 0.65mm/px · 2 of 39 slices shown]
[im 1/39]
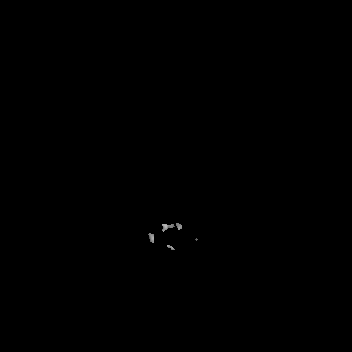
[im 13/39]
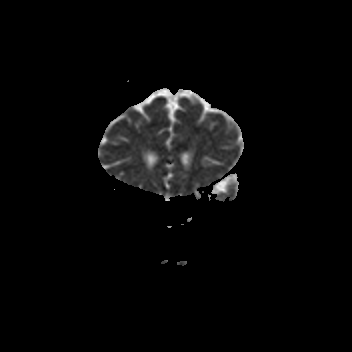

[Series 13: T1 · sagittal · 5.0mm · 0.94mm/px · 3 of 23 slices shown (1 of 2)]
[im 1/23]
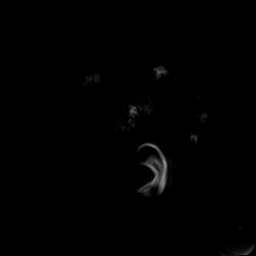
[im 12/23]
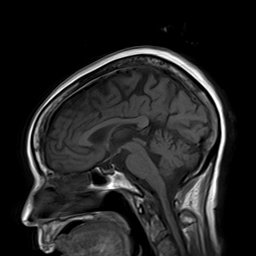
[im 23/23]
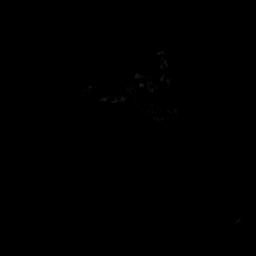

[Series 14: T2 · axial · 5.0mm · 0.45mm/px · z∈[-168,-25]mm · 3 of 27 slices shown (1 of 2)]
[im 1/27]
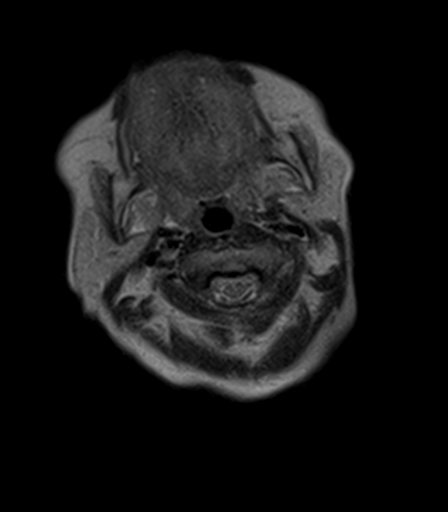
[im 14/27]
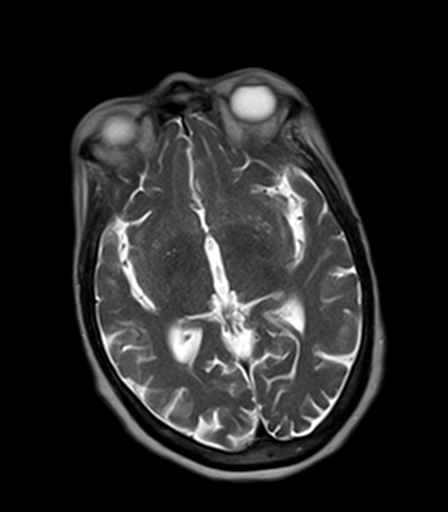
[im 27/27]
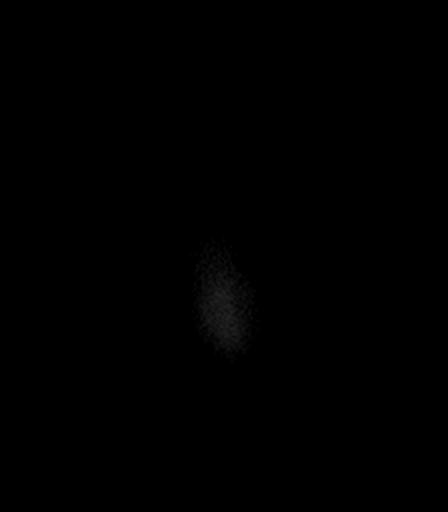

[Series 19: FLAIR · axial · 3.0mm · 0.53mm/px · z∈[-169,-21]mm · 6 of 55 slices shown]
[im 1/55]
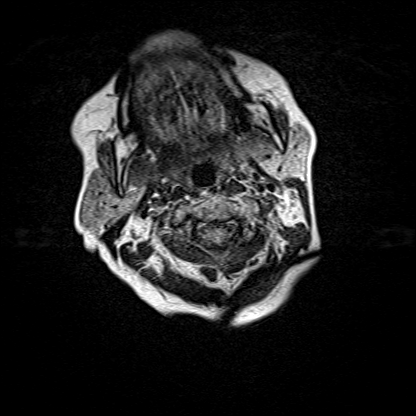
[im 11/55]
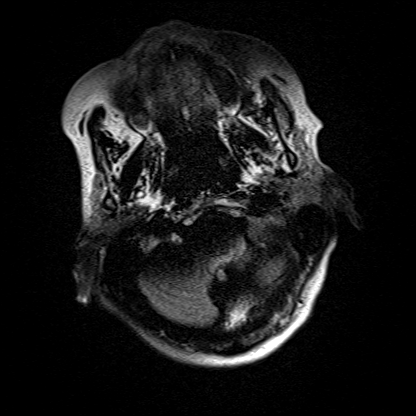
[im 22/55]
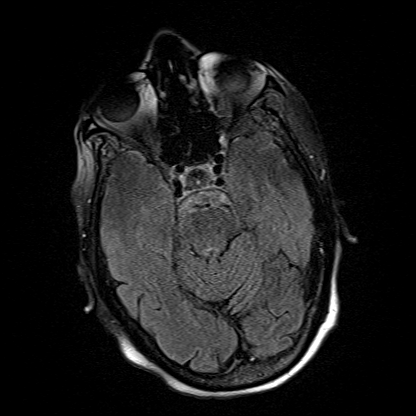
[im 33/55]
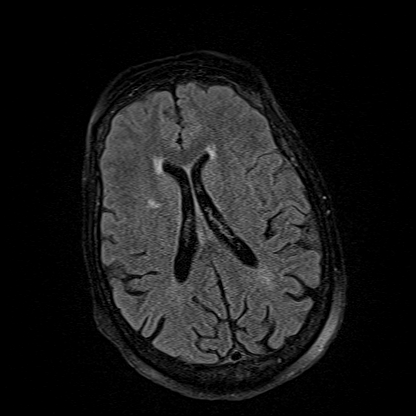
[im 44/55]
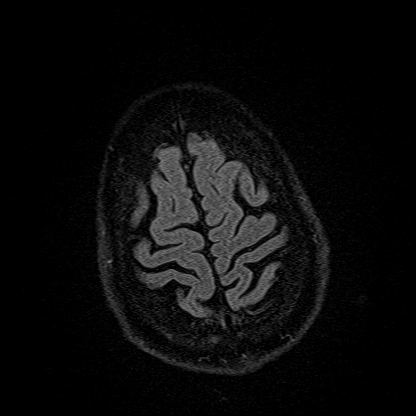
[im 55/55]
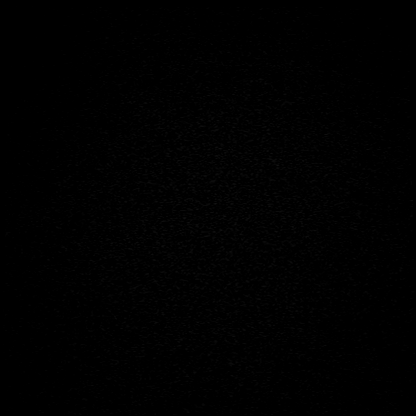

[Series 20: T1 · axial · 5.0mm · 0.90mm/px · z∈[-168,-25]mm · 3 of 27 slices shown (2 of 2)]
[im 1/27]
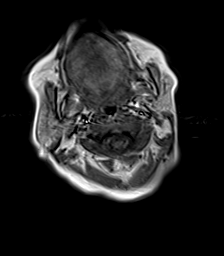
[im 14/27]
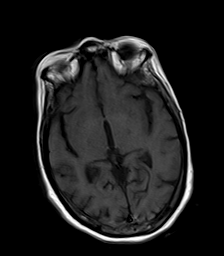
[im 27/27]
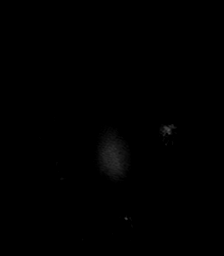

[Series 21: T2 · coronal · 5.0mm · 0.45mm/px · 3 of 31 slices shown (2 of 2)]
[im 1/31]
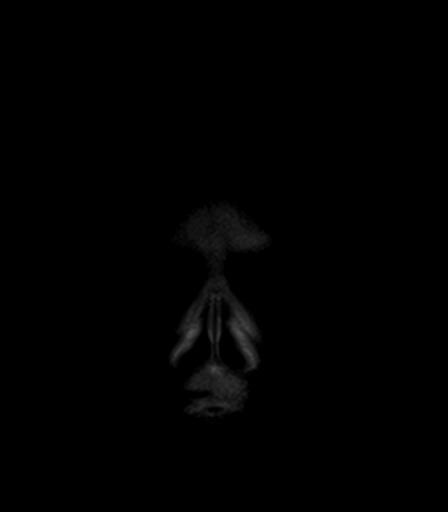
[im 16/31]
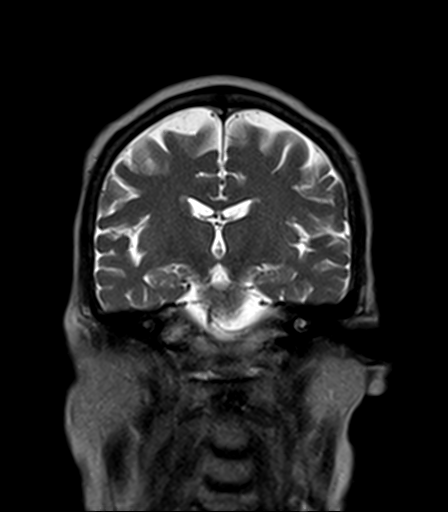
[im 31/31]
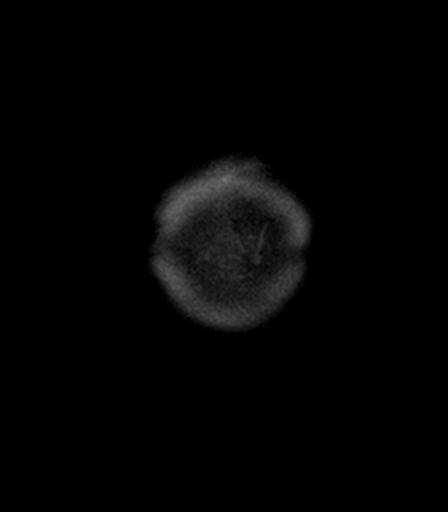

[35 of 48 positions shown; findings below may reference images not displayed]

FINDINGS: Brain: No acute infarct, hemorrhage, or mass lesion is present.
Moderate generalized atrophy is most prominent in the parietal lobes
bilaterally. Periventricular T2 hyperintensities are mildly advanced
for age. Focal T2 hyperintensity is present in the right corona
radiata. Scattered subcortical T2 hyperintensities are mildly
advanced for age is well.

No acute infarct, hemorrhage, or mass lesion is present. Basal
ganglia are intact. The internal auditory canals are within normal
limits. The brainstem and cerebellum are within normal limits.

Vascular: Flow is present in the major intracranial arteries.

Skull and upper cervical spine: The craniocervical junction is
normal. Upper cervical spine is within normal limits. Marrow signal
is unremarkable.

Sinuses/Orbits: The paranasal sinuses and mastoid air cells are
clear. The globes and orbits are within normal limits.
IMPRESSION: 1. No acute intracranial abnormality.
2. Moderate generalized atrophy and white matter disease is mildly
advanced for age. This likely reflects the sequela of chronic
microvascular ischemia.

## 2020-08-25 MED ORDER — PANTOPRAZOLE SODIUM 40 MG IV SOLR
40.0000 mg | Freq: Once | INTRAVENOUS | Status: AC
  Administered 2020-08-25: 40 mg via INTRAVENOUS
  Filled 2020-08-25 (×2): qty 40

## 2020-08-25 MED ORDER — PANTOPRAZOLE SODIUM 40 MG PO TBEC
40.0000 mg | DELAYED_RELEASE_TABLET | Freq: Every day | ORAL | Status: DC
  Filled 2020-08-25: qty 1

## 2020-08-25 MED ORDER — HYDRALAZINE HCL 10 MG PO TABS
10.0000 mg | ORAL_TABLET | Freq: Four times a day (QID) | ORAL | Status: DC | PRN
  Administered 2020-08-25: 10 mg via ORAL
  Filled 2020-08-25 (×3): qty 1

## 2020-08-25 MED ORDER — FLUTICASONE PROPIONATE 50 MCG/ACT NA SUSP
2.0000 | Freq: Every day | NASAL | Status: DC
  Administered 2020-08-25 – 2020-08-27 (×2): 2 via NASAL
  Filled 2020-08-25 (×2): qty 16

## 2020-08-25 MED ORDER — ONDANSETRON HCL 4 MG/2ML IJ SOLN
4.0000 mg | Freq: Four times a day (QID) | INTRAMUSCULAR | Status: DC | PRN
  Administered 2020-08-26: 4 mg via INTRAVENOUS
  Filled 2020-08-25: qty 2

## 2020-08-25 MED ORDER — GUAIFENESIN 100 MG/5ML PO SOLN
10.0000 mL | ORAL | Status: DC | PRN
  Administered 2020-08-26: 200 mg via ORAL
  Filled 2020-08-25 (×3): qty 10

## 2020-08-25 MED ORDER — ONDANSETRON HCL 4 MG/2ML IJ SOLN
4.0000 mg | Freq: Once | INTRAMUSCULAR | Status: AC
  Administered 2020-08-25: 4 mg via INTRAVENOUS
  Filled 2020-08-25 (×2): qty 2

## 2020-08-25 MED ORDER — NALOXONE HCL 2 MG/2ML IJ SOSY
0.4000 mg | PREFILLED_SYRINGE | Freq: Once | INTRAMUSCULAR | Status: AC
  Administered 2020-08-25: 0.4 mg via INTRAVENOUS
  Filled 2020-08-25: qty 2

## 2020-08-25 MED ORDER — ENOXAPARIN SODIUM 40 MG/0.4ML ~~LOC~~ SOLN
40.0000 mg | SUBCUTANEOUS | Status: DC
  Administered 2020-08-25 – 2020-08-27 (×2): 40 mg via SUBCUTANEOUS
  Filled 2020-08-25 (×2): qty 0.4

## 2020-08-25 MED ORDER — THIAMINE HCL 100 MG/ML IJ SOLN
500.0000 mg | Freq: Three times a day (TID) | INTRAVENOUS | Status: DC
  Administered 2020-08-25 – 2020-08-27 (×6): 500 mg via INTRAVENOUS
  Filled 2020-08-25 (×9): qty 5

## 2020-08-25 MED ORDER — PANTOPRAZOLE SODIUM 40 MG PO PACK
40.0000 mg | PACK | Freq: Every day | ORAL | Status: DC
  Administered 2020-08-25 – 2020-08-27 (×3): 40 mg via ORAL
  Filled 2020-08-25 (×3): qty 20

## 2020-08-25 MED ORDER — ONDANSETRON HCL 4 MG PO TABS: 4.0000 mg | ORAL_TABLET | Freq: Four times a day (QID) | ORAL | Status: DC | PRN

## 2020-08-25 MED ORDER — MAGNESIUM HYDROXIDE 400 MG/5ML PO SUSP: 30.0000 mL | Freq: Every day | ORAL | Status: DC | PRN

## 2020-08-25 MED ORDER — LISINOPRIL 20 MG PO TABS
20.0000 mg | ORAL_TABLET | Freq: Every day | ORAL | Status: DC
  Administered 2020-08-25 – 2020-08-27 (×3): 20 mg via ORAL
  Filled 2020-08-25 (×3): qty 1

## 2020-08-25 MED ORDER — ACETAMINOPHEN 650 MG RE SUPP: 650.0000 mg | Freq: Four times a day (QID) | RECTAL | Status: DC | PRN

## 2020-08-25 MED ORDER — SODIUM CHLORIDE 0.9 % IV SOLN: INTRAVENOUS | Status: DC

## 2020-08-25 MED ORDER — ACETAMINOPHEN 325 MG PO TABS: 650.0000 mg | ORAL_TABLET | Freq: Four times a day (QID) | ORAL | Status: DC | PRN

## 2020-08-25 MED ORDER — METOPROLOL SUCCINATE ER 25 MG PO TB24
25.0000 mg | ORAL_TABLET | Freq: Every day | ORAL | Status: DC
  Filled 2020-08-25: qty 1

## 2020-08-25 MED ORDER — GUAIFENESIN ER 600 MG PO TB12
600.0000 mg | ORAL_TABLET | Freq: Two times a day (BID) | ORAL | Status: DC
  Filled 2020-08-25 (×2): qty 1

## 2020-08-25 MED ORDER — ASPIRIN 300 MG RE SUPP
300.0000 mg | Freq: Once | RECTAL | Status: AC
  Administered 2020-08-25: 300 mg via RECTAL
  Filled 2020-08-25: qty 1

## 2020-08-25 MED ORDER — VITAMIN B-12 1000 MCG PO TABS
1000.0000 ug | ORAL_TABLET | Freq: Every day | ORAL | Status: DC
  Administered 2020-08-25 – 2020-08-27 (×2): 1000 ug via ORAL
  Filled 2020-08-25 (×2): qty 1

## 2020-08-25 MED ORDER — METOPROLOL TARTRATE 25 MG PO TABS
12.5000 mg | ORAL_TABLET | Freq: Two times a day (BID) | ORAL | Status: DC
  Administered 2020-08-25 – 2020-08-27 (×5): 12.5 mg via ORAL
  Filled 2020-08-25 (×5): qty 1

## 2020-08-25 MED ORDER — TRAZODONE HCL 50 MG PO TABS: 25.0000 mg | ORAL_TABLET | Freq: Every evening | ORAL | Status: DC | PRN

## 2020-08-25 NOTE — ED Notes (Signed)
Dr Leonides Schanz notified of failed dysphagia screen.

## 2020-08-25 NOTE — ED Notes (Signed)
Pt somnolent, responds to verbal stimuli after Narcan. Dr Leonides Schanz was at bedside during administration.

## 2020-08-25 NOTE — H&P (Signed)
Donna Aguirre is a 65 y.o. female  [id number]  Primary Cardiologist: N/A Reason for Consultation: Elevated Troponin  HPI: Patient is a 65 year old female with past medical history of hypertension, hyponatremia, DMII, and osteoarthritis who presents emergency room with altered mental status.  Patient had started having confusion, increased lethargy, and slurred speech over the last couple days and therefore her daughter called EMS.  Of note patient was positive for COVID-19 1 month ago.  We have been consulted as patient was found to have mildly elevated troponin with a current peak of 77.   Review of Systems: Denies chest pain, shortness of breath, orthopnea, or PND   Past Medical History:  Diagnosis Date  . Abdominal pain 11/22/2019  . Arthritis   . Diabetes mellitus without complication ([place])   . Gout   . Hypertension   . Hypokalemia 11/22/2019  . Hypomagnesemia 11/22/2019  . Hyponatremia 11/16/2018    (Not in a hospital admission)    . enoxaparin (LOVENOX) injection  40 mg Subcutaneous Q24H  . fluticasone  2 spray Each Nare Daily  . guaiFENesin  600 mg Oral BID  . lisinopril  20 mg Oral Daily  . metoprolol succinate  25 mg Oral Daily  . pantoprazole  40 mg Oral Daily  . potassium chloride  40 mEq Oral Once    Infusions: . sodium chloride 100 mL/hr at 08/25/20 0731    No Known Allergies  Social History   Socioeconomic History  . Marital status: Single    Spouse name: Not on file  . Number of children: Not on file  . Years of education: Not on file  . Highest education level: Not on file  Occupational History  . Not on file  Tobacco Use  . Smoking status: Current Some Day Smoker    Types: Cigarettes  . Smokeless tobacco: Current User    Types: Snuff  Substance and Sexual Activity  . Alcohol use: Yes    Alcohol/week: 7.0 standard drinks    Types: 7 Cans of beer per week    Comment: one 40 oz. states she drinks everytime some one brings her something    . Drug use: No  . Sexual activity: Not on file  Other Topics Concern  . Not on file  Social History Narrative  . Not on file   Social Determinants of Health   Financial Resource Strain: Not on file  Food Insecurity: Not on file  Transportation Needs: Not on file  Physical Activity: Not on file  Stress: Not on file  Social Connections: Not on file  Intimate Partner Violence: Not on file    Family History  Problem Relation Age of Onset  . Hypertension Mother   . Hypertension Father     PHYSICAL EXAM: Vitals:   08/25/20 0845 08/25/20 0915  BP: (!) 160/67   Pulse:  66  Resp: 16 12  Temp:    SpO2:  99%     Intake/Output Summary (Last 24 hours) at 08/25/2020 1047 Last data filed at 08/25/2020 0040 Gross per 24 hour  Intake 1000 ml  Output --  Net 1000 ml    General:  Well appearing. No respiratory difficulty HEENT: normal Neck: supple. no JVD. Carotids 2+ bilat; no bruits. No lymphadenopathy or thryomegaly appreciated. Cor: PMI nondisplaced. Regular rate & rhythm. IV/VI systolic murmur Lungs: clear Abdomen: soft, nontender, nondistended. No hepatosplenomegaly. No bruits or masses. Good bowel sounds. Extremities: no cyanosis, clubbing, rash, edema Neuro: alert & oriented x  3, cranial nerves grossly intact. moves all 4 extremities w/o difficulty. Affect pleasant.  ECG: Normal sinus rhythm with first-degree AV block and left axis deviation.  86/70  Results for orders placed or performed during the hospital encounter of 08/25/20 (from the past 24 hour(s))  CBC     Status: Abnormal   Collection Time: 08/24/20  9:32 PM  Result Value Ref Range   WBC 5.6 4.0 - 10.5 K/uL   RBC 4.33 3.87 - 5.11 MIL/uL   Hemoglobin 11.3 (L) 12.0 - 15.0 g/dL   HCT 33.8 (L) 36.0 - 46.0 %   MCV 78.1 (L) 80.0 - 100.0 fL   MCH 26.1 26.0 - 34.0 pg   MCHC 33.4 30.0 - 36.0 g/dL   RDW 15.2 11.5 - 15.5 %   Platelets 302 150 - 400 K/uL   nRBC 0.0 0.0 - 0.2 %  Comprehensive metabolic panel      Status: Abnormal   Collection Time: 08/24/20  9:32 PM  Result Value Ref Range   Sodium 128 (L) 135 - 145 mmol/L   Potassium 3.0 (L) 3.5 - 5.1 mmol/L   Chloride 92 (L) 98 - 111 mmol/L   CO2 26 22 - 32 mmol/L   Glucose, Bld 117 (H) 70 - 99 mg/dL   BUN <5 (L) 8 - 23 mg/dL   Creatinine, Ser 0.41 (L) 0.44 - 1.00 mg/dL   Calcium 9.1 8.9 - 10.3 mg/dL   Total Protein 9.0 (H) 6.5 - 8.1 g/dL   Albumin 4.1 3.5 - 5.0 g/dL   AST 17 15 - 41 U/L   ALT 9 0 - 44 U/L   Alkaline Phosphatase 76 38 - 126 U/L   Total Bilirubin 1.2 0.3 - 1.2 mg/dL   GFR, Estimated >60 >60 mL/min   Anion gap 10 5 - 15  Troponin I (High Sensitivity)     Status: Abnormal   Collection Time: 08/24/20  9:32 PM  Result Value Ref Range   Troponin I (High Sensitivity) 23 (H) <18 ng/L  Lipase, blood     Status: Abnormal   Collection Time: 08/24/20  9:32 PM  Result Value Ref Range   Lipase 59 (H) 11 - 51 U/L  Ethanol     Status: None   Collection Time: 08/24/20 11:39 PM  Result Value Ref Range   Alcohol, Ethyl (B) <10 <10 mg/dL  Urinalysis, Complete w Microscopic     Status: Abnormal   Collection Time: 08/24/20 11:54 PM  Result Value Ref Range   Color, Urine STRAW (A) YELLOW   APPearance CLEAR (A) CLEAR   Specific Gravity, Urine 1.009 1.005 - 1.030   pH 8.0 5.0 - 8.0   Glucose, UA NEGATIVE NEGATIVE mg/dL   Hgb urine dipstick SMALL (A) NEGATIVE   Bilirubin Urine NEGATIVE NEGATIVE   Ketones, ur 20 (A) NEGATIVE mg/dL   Protein, ur NEGATIVE NEGATIVE mg/dL   Nitrite NEGATIVE NEGATIVE   Leukocytes,Ua NEGATIVE NEGATIVE   RBC / HPF 0-5 0 - 5 RBC/hpf   WBC, UA 0-5 0 - 5 WBC/hpf   Bacteria, UA NONE SEEN NONE SEEN   Squamous Epithelial / LPF 0-5 0 - 5   Mucus PRESENT   Urine Drug Screen, Qualitative ([facility] only)     Status: None   Collection Time: 08/24/20 11:54 PM  Result Value Ref Range   Tricyclic, Ur Screen NONE DETECTED NONE DETECTED   Amphetamines, Ur Screen NONE DETECTED NONE DETECTED   MDMA (Ecstasy)Ur Screen NONE  DETECTED NONE DETECTED  Cocaine Metabolite,Ur [place] NONE DETECTED NONE DETECTED   Opiate, Ur Screen NONE DETECTED NONE DETECTED   Phencyclidine (PCP) Ur S NONE DETECTED NONE DETECTED   Cannabinoid 50 Ng, Ur [place] NONE DETECTED NONE DETECTED   Barbiturates, Ur Screen NONE DETECTED NONE DETECTED   Benzodiazepine, Ur Scrn NONE DETECTED NONE DETECTED   Methadone Scn, Ur NONE DETECTED NONE DETECTED  Troponin I (High Sensitivity)     Status: Abnormal   Collection Time: 08/25/20 12:53 AM  Result Value Ref Range   Troponin I (High Sensitivity) 44 (H) <18 ng/L  Magnesium     Status: None   Collection Time: 08/25/20 12:53 AM  Result Value Ref Range   Magnesium 2.2 1.7 - 2.4 mg/dL  Ammonia     Status: None   Collection Time: 08/25/20  2:41 AM  Result Value Ref Range   Ammonia 24 9 - 35 umol/L  Procalcitonin     Status: None   Collection Time: 08/25/20  2:41 AM  Result Value Ref Range   Procalcitonin <0.10 ng/mL  Blood gas, arterial     Status: None   Collection Time: 08/25/20  4:10 AM  Result Value Ref Range   pH, Arterial 7.40 7.350 - 7.450   pCO2 arterial 40 32.0 - 48.0 mmHg   pO2, Arterial 96 83.0 - 108.0 mmHg   Bicarbonate 24.8 20.0 - 28.0 mmol/L   Acid-Base Excess 0.0 0.0 - 2.0 mmol/L   O2 Saturation 97.5 %   Patient temperature 37.0    Collection site RIGHT RADIAL    Sample type ARTERIAL DRAW    Allens test (pass/fail) PASS PASS  Troponin I (High Sensitivity)     Status: Abnormal   Collection Time: 08/25/20  4:44 AM  Result Value Ref Range   Troponin I (High Sensitivity) 77 (H) <18 ng/L  CBG monitoring, ED     Status: Abnormal   Collection Time: 08/25/20  5:00 AM  Result Value Ref Range   Glucose-Capillary 100 (H) 70 - 99 mg/dL   CT Head Wo Contrast  Result Date: 08/25/2020 CLINICAL DATA:  Altered mental status EXAM: CT HEAD WITHOUT CONTRAST TECHNIQUE: Contiguous axial images were obtained from the base of the skull through the vertex without intravenous contrast. COMPARISON:   None. FINDINGS: Brain: Normal anatomic configuration. No abnormal intra or extra-axial mass lesion or fluid collection. No abnormal mass effect or midline shift. No evidence of acute intracranial hemorrhage or infarct. Ventricular size is normal. Cerebellum unremarkable. Vascular: Moderate atherosclerotic calcification within the carotid siphons. No asymmetric hyperdense vasculature at the skull base. Skull: Intact Sinuses/Orbits: Paranasal sinuses are clear. Orbits are unremarkable. Other: Mastoid air cells and middle ear cavities are clear. IMPRESSION: No acute intracranial abnormality. Electronically Signed   By: Fidela Salisbury MD   On: 08/25/2020 03:27   CT Angio Chest PE W and/or Wo Contrast  Result Date: 08/25/2020 CLINICAL DATA:  Nausea and vomiting with abdominal and chest pain, history of prior COVID-19 infection EXAM: CT ANGIOGRAPHY CHEST CT ABDOMEN AND PELVIS WITH CONTRAST TECHNIQUE: Multidetector CT imaging of the chest was performed using the standard protocol during bolus administration of intravenous contrast. Multiplanar CT image reconstructions and MIPs were obtained to evaluate the vascular anatomy. Multidetector CT imaging of the abdomen and pelvis was performed using the standard protocol during bolus administration of intravenous contrast. CONTRAST:  171mL OMNIPAQUE IOHEXOL 350 MG/ML SOLN COMPARISON:  07/19/2020 FINDINGS: CTA CHEST FINDINGS Cardiovascular: Heart is at the upper limits of normal in size. Aortic calcifications are  noted. Coronary calcifications are seen as well. No dissection or aneurysmal dilatation is noted. The pulmonary artery shows a normal branching pattern bilaterally. No filling defects to suggest pulmonary emboli are noted. Mediastinum/Nodes: Thoracic inlet is within normal limits. No hilar or mediastinal adenopathy is noted. The esophagus is significantly dilated with air and ingested food stuffs. This may also be related to reflux. No significant wall thickening is  noted. Lungs/Pleura: The lungs are well aerated bilaterally. Some patchy ground-glass airspace opacities are noted primarily in the right upper lobe likely related to the prior COVID-19 infection. No focal infiltrate or effusion is seen. No sizable parenchymal nodule is noted. Musculoskeletal: Degenerative changes of the thoracic spine are noted. Healed sternal fracture is noted. Review of the MIP images confirms the above findings. CT ABDOMEN and PELVIS FINDINGS Hepatobiliary: No focal liver abnormality is seen. No gallstones, gallbladder wall thickening, or biliary dilatation. Pancreas: Unremarkable. No pancreatic ductal dilatation or surrounding inflammatory changes. Spleen: Normal in size without focal abnormality. Adrenals/Urinary Tract: Adrenal glands are within normal limits. Kidneys demonstrate a normal enhancement pattern. Normal excretion is noted bilaterally. No obstructive changes are noted. Bladder is decompressed. Stomach/Bowel: No obstructive or inflammatory changes of the colon are seen. The appendix is not well visualized. No inflammatory changes to suggest appendicitis are noted. Small bowel and stomach appear within normal limits. Vascular/Lymphatic: Aortic atherosclerosis. No enlarged abdominal or pelvic lymph nodes. Reproductive: Uterus and bilateral adnexa are unremarkable. Other: No abdominal wall hernia or abnormality. No abdominopelvic ascites. Musculoskeletal: Degenerative changes of the lumbar spine Review of the MIP images confirms the above findings. IMPRESSION: CTA of the chest: No filling defects to suggest pulmonary emboli are identified. Patchy ground-glass opacities in the right upper lobe consistent with the prior COVID-19 infection. Significant dilatation of the esophagus with air and ingested food stuffs. This may be related to reflux but is of uncertain chronicity. CT of the abdomen and pelvis: No acute abnormality in the abdomen pelvis is noted. Electronically Signed   By: Inez Catalina M.D.   On: 08/25/2020 00:26   CT ABDOMEN PELVIS W CONTRAST  Result Date: 08/25/2020 CLINICAL DATA:  Nausea and vomiting with abdominal and chest pain, history of prior COVID-19 infection EXAM: CT ANGIOGRAPHY CHEST CT ABDOMEN AND PELVIS WITH CONTRAST TECHNIQUE: Multidetector CT imaging of the chest was performed using the standard protocol during bolus administration of intravenous contrast. Multiplanar CT image reconstructions and MIPs were obtained to evaluate the vascular anatomy. Multidetector CT imaging of the abdomen and pelvis was performed using the standard protocol during bolus administration of intravenous contrast. CONTRAST:  122mL OMNIPAQUE IOHEXOL 350 MG/ML SOLN COMPARISON:  07/19/2020 FINDINGS: CTA CHEST FINDINGS Cardiovascular: Heart is at the upper limits of normal in size. Aortic calcifications are noted. Coronary calcifications are seen as well. No dissection or aneurysmal dilatation is noted. The pulmonary artery shows a normal branching pattern bilaterally. No filling defects to suggest pulmonary emboli are noted. Mediastinum/Nodes: Thoracic inlet is within normal limits. No hilar or mediastinal adenopathy is noted. The esophagus is significantly dilated with air and ingested food stuffs. This may also be related to reflux. No significant wall thickening is noted. Lungs/Pleura: The lungs are well aerated bilaterally. Some patchy ground-glass airspace opacities are noted primarily in the right upper lobe likely related to the prior COVID-19 infection. No focal infiltrate or effusion is seen. No sizable parenchymal nodule is noted. Musculoskeletal: Degenerative changes of the thoracic spine are noted. Healed sternal fracture is noted. Review of the MIP  images confirms the above findings. CT ABDOMEN and PELVIS FINDINGS Hepatobiliary: No focal liver abnormality is seen. No gallstones, gallbladder wall thickening, or biliary dilatation. Pancreas: Unremarkable. No pancreatic ductal  dilatation or surrounding inflammatory changes. Spleen: Normal in size without focal abnormality. Adrenals/Urinary Tract: Adrenal glands are within normal limits. Kidneys demonstrate a normal enhancement pattern. Normal excretion is noted bilaterally. No obstructive changes are noted. Bladder is decompressed. Stomach/Bowel: No obstructive or inflammatory changes of the colon are seen. The appendix is not well visualized. No inflammatory changes to suggest appendicitis are noted. Small bowel and stomach appear within normal limits. Vascular/Lymphatic: Aortic atherosclerosis. No enlarged abdominal or pelvic lymph nodes. Reproductive: Uterus and bilateral adnexa are unremarkable. Other: No abdominal wall hernia or abnormality. No abdominopelvic ascites. Musculoskeletal: Degenerative changes of the lumbar spine Review of the MIP images confirms the above findings. IMPRESSION: CTA of the chest: No filling defects to suggest pulmonary emboli are identified. Patchy ground-glass opacities in the right upper lobe consistent with the prior COVID-19 infection. Significant dilatation of the esophagus with air and ingested food stuffs. This may be related to reflux but is of uncertain chronicity. CT of the abdomen and pelvis: No acute abnormality in the abdomen pelvis is noted. Electronically Signed   By: Inez Catalina M.D.   On: 08/25/2020 00:26     ASSESSMENT AND PLAN: Patient presenting to the emergency department with acute altered mental status.  Upon assessment patient was alert and oriented x3.  Previous AMS most likely multifactorial with hypertensive urgency versus hyponatremia versus dehydration.  Elevated troponin most likely due to demand ischemia and not ACS.  Patient does have significant murmur and therefore will check echocardiogram for valvular dysfunction.  We will continue to follow.  Adaline Sill NP-C

## 2020-08-25 NOTE — Progress Notes (Signed)
Mobility Specialist - Progress Note   08/25/20 1634  Mobility  Activity Ambulated in hall;Ambulated to bathroom  Level of Assistance Independent  Assistive Device None  Distance Ambulated (ft) 800 ft  Mobility Response Tolerated well  Mobility performed by Mobility specialist  $Mobility charge 1 Mobility    Pre-mobility: 92 HR, 98% SpO2 During mobility: 120 HR, 100% SpO2 Post-mobility: 108 HR, 100% SpO2   Pt ambulated in hallway and to bathroom. Independent in all transfers, including ambulation without AD and peri-care. No LOB. No SOB/dizziness/chest pain. 120 max HR, O2 maintained high 90s on RA.    Kathee Delton Mobility Specialist 08/25/20, 4:37 PM

## 2020-08-25 NOTE — ED Notes (Signed)
Patient transported to MRI 

## 2020-08-25 NOTE — ED Notes (Signed)
Pt c/o spitting up post nasal drainage and nausea at this time.

## 2020-08-25 NOTE — ED Provider Notes (Addendum)
[facility] Emergency Department Provider Note  ____________________________________________   Event Date/Time   First MD Initiated Contact with Patient 08/25/20 0119     (approximate)  I have reviewed the triage vital signs and the nursing notes.   HISTORY  Chief Complaint Abdominal Pain and Nasal Congestion    HPI Donna Aguirre is a 65 y.o. female with history of hypertension, diabetes, alcohol abuse who presents to the emergency department with complaints of nasal congestion, sore throat, nausea, vomiting, diarrhea.  Patient is unable to provide much history of how long this has been going on.  She denies to me any chest pain or shortness of breath.  She denies any abdominal pain at this time.  No family currently at bedside.        Past Medical History:  Diagnosis Date  . Abdominal pain 11/22/2019  . Arthritis   . Diabetes mellitus without complication ([name])   . Gout   . Hypertension   . Hypokalemia 11/22/2019  . Hypomagnesemia 11/22/2019  . Hyponatremia 11/16/2018    Patient Active Problem List   Diagnosis Date Noted  . Gastroenteritis due to COVID-19 virus 07/20/2020  . Hypokalemia 07/19/2020  . COVID-19 virus detected 07/19/2020  . Nicotine dependence 07/19/2020  . E-coli UTI   . Tachycardia   . Esophageal stricture 11/23/2019  . Esophageal dysphagia   . Esophagitis   . Alcohol use disorder, moderate, dependence ([name]) 11/22/2019  . Elevated troponin 11/22/2019  . Pancytopenia ([name]) 11/22/2019  . HTN (hypertension) 11/22/2019  . History of esophageal stricture on EGD 12/2018 [facility] 11/22/2019  . PUD (peptic ulcer disease), with hx of perforated gastric ulcer 11/22/2019  . Vomiting 11/22/2019  . Hypomagnesemia 11/22/2019  . Alcohol abuse   . Delirium due to another medical condition, acute, mixed level of activity 10/23/2018    Past Surgical History:  Procedure Laterality Date  . ESOPHAGOGASTRODUODENOSCOPY (EGD) WITH PROPOFOL N/A  11/23/2019   Procedure: ESOPHAGOGASTRODUODENOSCOPY (EGD) WITH PROPOFOL;  Surgeon: [name], MD;  Location: [place];  Service: Gastroenterology;  Laterality: N/A;  . perforated gastric ulcer      Prior to Admission medications   Medication Sig Start Date End Date Taking? Authorizing Provider  lisinopril (ZESTRIL) 20 MG tablet Take 1 tablet (20 mg total) by mouth daily. 07/22/20 10/20/20  [name], MD  metoprolol succinate (TOPROL-XL) 25 MG 24 hr tablet Take 1 tablet (25 mg total) by mouth daily. 07/21/20 10/19/20  [name], MD  omeprazole (PRILOSEC) 40 MG capsule Take 1 capsule (40 mg total) by mouth 2 (two) times daily before a meal. 01/22/20 04/21/20  [name], MD    Allergies Patient has no known allergies.  Family History  Problem Relation Age of Onset  . Hypertension Mother   . Hypertension Father     Social History Social History   Tobacco Use  . Smoking status: Current Some Day Smoker    Types: Cigarettes  . Smokeless tobacco: Current User    Types: Snuff  Substance Use Topics  . Alcohol use: Yes    Alcohol/week: 7.0 standard drinks    Types: 7 Cans of beer per week    Comment: one 40 oz. states she drinks everytime some one brings her something   . Drug use: No    Review of Systems Level 5 caveat secondary to somnolence  ____________________________________________   PHYSICAL EXAM:  VITAL SIGNS: ED Triage Vitals  Enc Vitals Group     BP 08/24/20 2125 Mar[place] Kitchen)  192/107     Pulse Rate 08/24/20 2125 86     Resp 08/24/20 2125 20     Temp 08/24/20 2125 97.6 F (36.4 C)     Temp Source 08/24/20 2125 Oral     SpO2 08/24/20 2117 (!) 80 %     Weight 08/24/20 2126 146 lb (66.2 kg)     Height 08/24/20 2126 5\' 4"  (1.626 m)     Head Circumference --      Peak Flow --      Pain Score 08/24/20 2126 8     Pain Loc --      Pain Edu? --      Excl. in [place]? --    CONSTITUTIONAL: Alert and oriented x 3 for nursing staff but somnolent on my  examination.  Arousable to voice but then quickly falls back asleep. HEAD: Normocephalic and atraumatic EYES: Conjunctivae clear, pupils appear equal, EOM appear intact ENT: normal nose; moist mucous membranes; No pharyngeal erythema or petechiae, no tonsillar hypertrophy or exudate, no uvular deviation, no unilateral swelling, no trismus or drooling, no muffled voice, normal phonation, no stridor, no dental caries present, no drainable dental abscess noted, no Ludwig's angina, tongue sits flat in the bottom of the mouth, no angioedema, no facial erythema or warmth, no facial swelling; no pain with movement of the neck, no cervical LAD. NECK: Supple, normal ROM, no meningismus CARD: RRR; S1 and S2 appreciated; no murmurs, no clicks, no rubs, no gallops RESP: Normal chest excursion without splinting or tachypnea; breath sounds clear and equal bilaterally; no wheezes, no rhonchi, no rales, no hypoxia or respiratory distress, speaking full sentences ABD/GI: Normal bowel sounds; non-distended; soft, non-tender, no rebound, no guarding, no peritoneal signs, no hepatosplenomegaly BACK: The back appears normal EXT: Normal ROM in all joints; no deformity noted, no edema; no cyanosis SKIN: Normal color for age and race; warm; no rash on exposed skin NEURO: Moves all extremities equally, no facial asymmetry, slightly slurred speech but no aphasia, somnolent PSYCH: The patient's mood and manner are appropriate.  ____________________________________________   LABS (all labs ordered are listed, but only abnormal results are displayed)  Labs Reviewed  CBC - Abnormal; Notable for the following components:      Result Value   Hemoglobin 11.3 (*)    HCT 33.8 (*)    MCV 78.1 (*)    All other components within normal limits  COMPREHENSIVE METABOLIC PANEL - Abnormal; Notable for the following components:   Sodium 128 (*)    Potassium 3.0 (*)    Chloride 92 (*)    Glucose, Bld 117 (*)    BUN <5 (*)     Creatinine, Ser 0.41 (*)    Total Protein 9.0 (*)    All other components within normal limits  LIPASE, BLOOD - Abnormal; Notable for the following components:   Lipase 59 (*)    All other components within normal limits  URINALYSIS, COMPLETE (UACMP) WITH MICROSCOPIC - Abnormal; Notable for the following components:   Color, Urine STRAW (*)    APPearance CLEAR (*)    Hgb urine dipstick SMALL (*)    Ketones, ur 20 (*)    All other components within normal limits  TROPONIN I (HIGH SENSITIVITY) - Abnormal; Notable for the following components:   Troponin I (High Sensitivity) 23 (*)    All other components within normal limits  TROPONIN I (HIGH SENSITIVITY) - Abnormal; Notable for the following components:   Troponin I (High Sensitivity) 44 (*)  All other components within normal limits  MAGNESIUM  ETHANOL  URINE DRUG SCREEN, QUALITATIVE ([facility] ONLY)  AMMONIA  BLOOD GAS, ARTERIAL  PROCALCITONIN  CBG MONITORING, ED  TROPONIN I (HIGH SENSITIVITY)   ____________________________________________  EKG   EKG Interpretation  Date/Time:  Monday August 24 2020 21:37:29 EDT Ventricular Rate:  86 PR Interval:  230 QRS Duration: 82 QT Interval:  406 QTC Calculation: 485 R Axis:   -36 Text Interpretation: Sinus rhythm with 1st degree A-V block Left axis deviation Cannot rule out Anterior infarct , age undetermined Abnormal ECG Confirmed by Pryor Curia [id number]) on 08/25/2020 1:19:02 AM       ____________________________________________  RADIOLOGY Jessie Foot Kelle Ruppert, personally viewed and evaluated these images (plain radiographs) as part of my medical decision making, as well as reviewing the written report by the radiologist.  ED MD interpretation: CT head shows no acute abnormality.  CT chest shows groundglass opacities in the right lung consistent with previous COVID-19 infection.  CT of the abdomen pelvis is unremarkable.  Official radiology report(s): CT Head Wo Contrast  Result  Date: 08/25/2020 CLINICAL DATA:  Altered mental status EXAM: CT HEAD WITHOUT CONTRAST TECHNIQUE: Contiguous axial images were obtained from the base of the skull through the vertex without intravenous contrast. COMPARISON:  None. FINDINGS: Brain: Normal anatomic configuration. No abnormal intra or extra-axial mass lesion or fluid collection. No abnormal mass effect or midline shift. No evidence of acute intracranial hemorrhage or infarct. Ventricular size is normal. Cerebellum unremarkable. Vascular: Moderate atherosclerotic calcification within the carotid siphons. No asymmetric hyperdense vasculature at the skull base. Skull: Intact Sinuses/Orbits: Paranasal sinuses are clear. Orbits are unremarkable. Other: Mastoid air cells and middle ear cavities are clear. IMPRESSION: No acute intracranial abnormality. Electronically Signed   By: Fidela Salisbury MD   On: 08/25/2020 03:27   CT Angio Chest PE W and/or Wo Contrast  Result Date: 08/25/2020 CLINICAL DATA:  Nausea and vomiting with abdominal and chest pain, history of prior COVID-19 infection EXAM: CT ANGIOGRAPHY CHEST CT ABDOMEN AND PELVIS WITH CONTRAST TECHNIQUE: Multidetector CT imaging of the chest was performed using the standard protocol during bolus administration of intravenous contrast. Multiplanar CT image reconstructions and MIPs were obtained to evaluate the vascular anatomy. Multidetector CT imaging of the abdomen and pelvis was performed using the standard protocol during bolus administration of intravenous contrast. CONTRAST:  129mL OMNIPAQUE IOHEXOL 350 MG/ML SOLN COMPARISON:  07/19/2020 FINDINGS: CTA CHEST FINDINGS Cardiovascular: Heart is at the upper limits of normal in size. Aortic calcifications are noted. Coronary calcifications are seen as well. No dissection or aneurysmal dilatation is noted. The pulmonary artery shows a normal branching pattern bilaterally. No filling defects to suggest pulmonary emboli are noted. Mediastinum/Nodes:  Thoracic inlet is within normal limits. No hilar or mediastinal adenopathy is noted. The esophagus is significantly dilated with air and ingested food stuffs. This may also be related to reflux. No significant wall thickening is noted. Lungs/Pleura: The lungs are well aerated bilaterally. Some patchy ground-glass airspace opacities are noted primarily in the right upper lobe likely related to the prior COVID-19 infection. No focal infiltrate or effusion is seen. No sizable parenchymal nodule is noted. Musculoskeletal: Degenerative changes of the thoracic spine are noted. Healed sternal fracture is noted. Review of the MIP images confirms the above findings. CT ABDOMEN and PELVIS FINDINGS Hepatobiliary: No focal liver abnormality is seen. No gallstones, gallbladder wall thickening, or biliary dilatation. Pancreas: Unremarkable. No pancreatic ductal dilatation or surrounding inflammatory changes. Spleen: Normal in  size without focal abnormality. Adrenals/Urinary Tract: Adrenal glands are within normal limits. Kidneys demonstrate a normal enhancement pattern. Normal excretion is noted bilaterally. No obstructive changes are noted. Bladder is decompressed. Stomach/Bowel: No obstructive or inflammatory changes of the colon are seen. The appendix is not well visualized. No inflammatory changes to suggest appendicitis are noted. Small bowel and stomach appear within normal limits. Vascular/Lymphatic: Aortic atherosclerosis. No enlarged abdominal or pelvic lymph nodes. Reproductive: Uterus and bilateral adnexa are unremarkable. Other: No abdominal wall hernia or abnormality. No abdominopelvic ascites. Musculoskeletal: Degenerative changes of the lumbar spine Review of the MIP images confirms the above findings. IMPRESSION: CTA of the chest: No filling defects to suggest pulmonary emboli are identified. Patchy ground-glass opacities in the right upper lobe consistent with the prior COVID-19 infection. Significant dilatation  of the esophagus with air and ingested food stuffs. This may be related to reflux but is of uncertain chronicity. CT of the abdomen and pelvis: No acute abnormality in the abdomen pelvis is noted. Electronically Signed   By: Inez Catalina M.D.   On: 08/25/2020 00:26   CT ABDOMEN PELVIS W CONTRAST  Result Date: 08/25/2020 CLINICAL DATA:  Nausea and vomiting with abdominal and chest pain, history of prior COVID-19 infection EXAM: CT ANGIOGRAPHY CHEST CT ABDOMEN AND PELVIS WITH CONTRAST TECHNIQUE: Multidetector CT imaging of the chest was performed using the standard protocol during bolus administration of intravenous contrast. Multiplanar CT image reconstructions and MIPs were obtained to evaluate the vascular anatomy. Multidetector CT imaging of the abdomen and pelvis was performed using the standard protocol during bolus administration of intravenous contrast. CONTRAST:  153mL OMNIPAQUE IOHEXOL 350 MG/ML SOLN COMPARISON:  07/19/2020 FINDINGS: CTA CHEST FINDINGS Cardiovascular: Heart is at the upper limits of normal in size. Aortic calcifications are noted. Coronary calcifications are seen as well. No dissection or aneurysmal dilatation is noted. The pulmonary artery shows a normal branching pattern bilaterally. No filling defects to suggest pulmonary emboli are noted. Mediastinum/Nodes: Thoracic inlet is within normal limits. No hilar or mediastinal adenopathy is noted. The esophagus is significantly dilated with air and ingested food stuffs. This may also be related to reflux. No significant wall thickening is noted. Lungs/Pleura: The lungs are well aerated bilaterally. Some patchy ground-glass airspace opacities are noted primarily in the right upper lobe likely related to the prior COVID-19 infection. No focal infiltrate or effusion is seen. No sizable parenchymal nodule is noted. Musculoskeletal: Degenerative changes of the thoracic spine are noted. Healed sternal fracture is noted. Review of the MIP images  confirms the above findings. CT ABDOMEN and PELVIS FINDINGS Hepatobiliary: No focal liver abnormality is seen. No gallstones, gallbladder wall thickening, or biliary dilatation. Pancreas: Unremarkable. No pancreatic ductal dilatation or surrounding inflammatory changes. Spleen: Normal in size without focal abnormality. Adrenals/Urinary Tract: Adrenal glands are within normal limits. Kidneys demonstrate a normal enhancement pattern. Normal excretion is noted bilaterally. No obstructive changes are noted. Bladder is decompressed. Stomach/Bowel: No obstructive or inflammatory changes of the colon are seen. The appendix is not well visualized. No inflammatory changes to suggest appendicitis are noted. Small bowel and stomach appear within normal limits. Vascular/Lymphatic: Aortic atherosclerosis. No enlarged abdominal or pelvic lymph nodes. Reproductive: Uterus and bilateral adnexa are unremarkable. Other: No abdominal wall hernia or abnormality. No abdominopelvic ascites. Musculoskeletal: Degenerative changes of the lumbar spine Review of the MIP images confirms the above findings. IMPRESSION: CTA of the chest: No filling defects to suggest pulmonary emboli are identified. Patchy ground-glass opacities in the right upper  lobe consistent with the prior COVID-19 infection. Significant dilatation of the esophagus with air and ingested food stuffs. This may be related to reflux but is of uncertain chronicity. CT of the abdomen and pelvis: No acute abnormality in the abdomen pelvis is noted. Electronically Signed   By: Inez Catalina M.D.   On: 08/25/2020 00:26    ____________________________________________   PROCEDURES  Procedure(s) performed (including Critical Care):  Procedures  CRITICAL CARE Performed by: Cyril Mourning Wynn Alldredge   Total critical care time: 45 minutes  Critical care time was exclusive of separately billable procedures and treating other patients.  Critical care was necessary to treat or prevent  imminent or life-threatening deterioration.  Critical care was time spent personally by me on the following activities: development of treatment plan with patient and/or surrogate as well as nursing, discussions with consultants, evaluation of patient's response to treatment, examination of patient, obtaining history from patient or surrogate, ordering and performing treatments and interventions, ordering and review of laboratory studies, ordering and review of radiographic studies, pulse oximetry and re-evaluation of patient's condition.  ____________________________________________   INITIAL IMPRESSION / ASSESSMENT AND PLAN / ED COURSE  As part of my medical decision making, I reviewed the following data within the [place] History obtained from family, Nursing notes reviewed and incorporated, Labs reviewed and CTs reviewed, EKG interpreted , Old EKG reviewed, Old chart reviewed, Discussed with admitting physician  and Notes from prior ED visits         Patient here with multiple complaints.  No family currently with the patient.  She was found to be hypoxic to 80% on room air on arrival.  Currently satting 100% now on room air.  She was seen in triage and multiple labs and imaging studies were ordered which have been unremarkable other than showing signs of COVID-19 pneumonia.  No PE, pulmonary edema.  She was positive for Covid on 07/19/2020.  She tells me that she is having sore throat and drainage in the back of her throat but denies any chest pain, abdominal pain which she endorsed in triage.  She does report vomiting and diarrhea.  CT of the abdomen pelvis shows no acute abnormality.  On my exam patient has slurred speech and is extremely somnolent.  We will add on additional labs imaging to evaluate for hypercarbia, intracranial hemorrhage or stroke, hepatic encephalopathy, acute intoxication.  Initial labs, urine unremarkable other than slightly elevated troponin.  She  denies to me having any chest pain or shortness of breath.  EKG is nonischemic.  We will continue to monitor closely and try to get in touch with patient's family.  ED PROGRESS    3:07 AM  Left VM with Madelin Headings, patient's daughter.  All other numbers listed not working at this time.  3:20 AM Danae Chen has given me a call back and provided me with Ivin Booty Johnson's phone number.  Left VM with Lolita Cram, patient's daughter who she lives with.  Patient given Narcan without any improvement.  Her ammonia level is normal.  Her ABG is normal.  Her procalcitonin is negative.  Ethanol level negative.  Urine drug screen negative.  CT head unremarkable.  Third troponin pending to see if it continues to rise.  4:39 AM  Spoke with Ivin Booty, patient's daughter.  She reports increased confusion intermittently x 1 month and falls x months.  She reports these spells are intermittent and has been going on for 2 days.  She states "she spaces out".  She's been weak and not able to walk.  She reports her mother is normally able to walk around without assistance.  She found her sitting on the floor this morning.  Last fall was 3 weeks.  No sedation medications at home.  She is on lisinopril and metoprolol.  Given patient's acute mental status change, will admit.  Unclear etiology for patient's encephalopathy.  Her blood pressure is normal now at 114/63 and she is still somnolent but BP was elevated earlier tonight in the 200s/100s.  No medications given for blood pressure in the ED.  ?  Hypertensive encephalopathy.  Her rectal temp is 97.1.  Will discuss with hospitalist.  4:51 AM Discussed patient's case with hospitalist, Dr. Sidney Ace.  I have recommended admission and patient (and family if present) agree with this plan. Admitting physician will place admission orders.   I reviewed all nursing notes, vitals, pertinent previous records and reviewed/interpreted all EKGs, lab and urine results, imaging (as  available).  5:21 AM  Pt's troponin continues to trend up slightly.  Her EKG is nonischemic and she has no chest pain or shortness of breath.  Will give aspirin suppository given she has failed her swallow screen.  Troponin elevation may be secondary to demand from her extremely elevated blood pressure previously.  We will continue to monitor on cardiac monitoring. ____________________________________________   FINAL CLINICAL IMPRESSION(S) / ED DIAGNOSES  Final diagnoses:  Somnolence  Nausea vomiting and diarrhea  Encephalopathy     ED Discharge Orders    None      *Please note:  TELICIA HODGKISS was evaluated in Emergency Department on 08/25/2020 for the symptoms described in the history of present illness. She was evaluated in the context of the global COVID-19 pandemic, which necessitated consideration that the patient might be at risk for infection with the SARS-CoV-2 virus that causes COVID-19. Institutional protocols and algorithms that pertain to the evaluation of patients at risk for COVID-19 are in a state of rapid change based on information released by regulatory bodies including the CDC and federal and state organizations. These policies and algorithms were followed during the patient's care in the ED.  Some ED evaluations and interventions may be delayed as a result of limited staffing during and the pandemic.*   Note:  This document was prepared using Dragon voice recognition software and may include unintentional dictation errors.   Jeston Junkins, Delice Bison, DO 08/25/20 [place], Delice Bison, DO 08/25/20 [phone]

## 2020-08-25 NOTE — ED Notes (Signed)
Message sent to floor nurse regarding pt being in MRI

## 2020-08-25 NOTE — ED Notes (Signed)
Per lab, trop not able to be added on. Redrawn from [place] at this time.

## 2020-08-25 NOTE — ED Notes (Signed)
Verbal order Dr Posey Pronto for SLP. Will hold PO meds until evaluation.

## 2020-08-25 NOTE — ED Notes (Signed)
Cardiology at bedside.

## 2020-08-25 NOTE — ED Notes (Signed)
Per reporting Nurse , Pt did not pass her swallow screen test and needs to be evaluated

## 2020-08-25 NOTE — ED Notes (Signed)
Speech at bedside

## 2020-08-25 NOTE — Consult Note (Signed)
Jonathon Bellows , MD [address] [place], [place], [place], [address] [address], [place], [place], [place], [address] Phone: [phone]  Fax: [phone]  Consultation  Referring Provider:  Dr Posey Pronto  Primary Care Physician:  Patient, No Pcp Per (Inactive) Primary Gastroenterologist:  Dr Marius Ditch          Reason for Consultation:     Dysphagia   Date of Admission:  08/25/2020 Date of Consultation:  08/25/2020         HPI:   Donna Aguirre is a 65 y.o. female who is known to Dr Marius Ditch at [facility]. 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   Today she comes into the hospital with altered mental status , elevated troponin, hypertensive urgency , CVA being ruled out, coughing , lowr abdominal pain   .    She says after her last dilation was much better but gradually started having difficulty swallowing over the months. Has not had much issues with liquids but does with solids. Denies any shortness of breath .   CT angiogram chest PE protocol showed features of prior covid infection , dilation of the esophagus with air and food   Barium esophagogram was obtained which shows irregular narrowing of the upper thoracic esophagus, esophageal malignancy cannot be excluded associated proximal esophageal distention is noted.  Past Medical History:  Diagnosis Date  . Abdominal pain 11/22/2019  . Arthritis   . Diabetes mellitus without complication ([place])   . Gout   . Hypertension   . Hypokalemia 11/22/2019  . Hypomagnesemia 11/22/2019  . Hyponatremia 11/16/2018    Past Surgical History:  Procedure Laterality Date  . ESOPHAGOGASTRODUODENOSCOPY (EGD) WITH PROPOFOL N/A 11/23/2019   Procedure: ESOPHAGOGASTRODUODENOSCOPY (EGD) WITH PROPOFOL;  Surgeon: [name], MD;  Location: [facility];  Service: Gastroenterology;  Laterality: N/A;  . perforated gastric ulcer       Prior to Admission medications   Medication Sig Start Date End Date Taking? Authorizing Provider  lisinopril (ZESTRIL) 20 MG tablet Take 1 tablet (20 mg total) by mouth daily. 07/22/20 10/20/20 Yes [name], MD  metoprolol succinate (TOPROL-XL) 25 MG 24 hr tablet Take 1 tablet (25 mg total) by mouth daily. 07/21/20 10/19/20 Yes [name], MD  omeprazole (PRILOSEC) 40 MG capsule Take 1 capsule (40 mg total) by mouth 2 (two) times daily before a meal. 01/22/20 04/21/20 Yes Vanga, Tally Due, MD    Family History  Problem Relation Age of Onset  . Hypertension Mother   . Hypertension Father      Social History   Tobacco Use  . Smoking status: Current Some Day Smoker    Types: Cigarettes  . Smokeless tobacco: Current User    Types: Snuff  Substance Use Topics  . Alcohol use: Yes    Alcohol/week: 7.0 standard drinks    Types: 7 Cans of beer per week    Comment: one 40 oz. states she drinks everytime some one brings her something   . Drug use: No    Allergies as of 08/24/2020  . (No Known Allergies)    Review of Systems:    All systems reviewed and negative except where noted in HPI.   Physical Exam:  Vital signs in last 24 hours: Temp:  [97.1 F (36.2 C)-97.7 F (36.5 C)] 97.7 F (36.5 C) (03/29 1255) Pulse Rate:  [  64-87] 81 (03/29 1255) Resp:  [12-21] 18 (03/29 1255) BP: (114-209)/(63-107) 157/79 (03/29 1255) SpO2:  [80 %-100 %] 100 % (03/29 1255) Weight:  [66.2 kg] 66.2 kg (03/28 2126) Last BM Date: 08/24/20 General:   Pleasant, cooperative in NAD Head:  Normocephalic and atraumatic. Eyes:   No icterus.   Conjunctiva pink. PERRLA. Ears:  Normal auditory acuity. Neck:  Supple; no masses or thyroidomegaly Lungs: Respirations even and unlabored. Lungs clear to auscultation bilaterally.   No wheezes, crackles, or rhonchi.  Heart:  Regular rate and rhythm;  Without murmur, clicks, rubs or gallops Abdomen:  Soft, nondistended, nontender. Normal bowel sounds. No  appreciable masses or hepatomegaly.  No rebound or guarding.  Neurologic:  Alert and oriented x3;   Skin:  Intact without significant lesions or rashes. Cervical Nodes:  No significant cervical adenopathy. Psych:  Alert and cooperative. Normal affect.  LAB RESULTS: Recent Labs    08/24/20 2132  WBC 5.6  HGB 11.3*  HCT 33.8*  PLT 302   BMET Recent Labs    08/24/20 2132  NA 128*  K 3.0*  CL 92*  CO2 26  GLUCOSE 117*  BUN <5*  CREATININE 0.41*  CALCIUM 9.1   LFT Recent Labs    08/24/20 2132  PROT 9.0*  ALBUMIN 4.1  AST 17  ALT 9  ALKPHOS 76  BILITOT 1.2   PT/INR No results for input(s): LABPROT, INR in the last 72 hours.  STUDIES: CT Head Wo Contrast  Result Date: 08/25/2020 CLINICAL DATA:  Altered mental status EXAM: CT HEAD WITHOUT CONTRAST TECHNIQUE: Contiguous axial images were obtained from the base of the skull through the vertex without intravenous contrast. COMPARISON:  None. FINDINGS: Brain: Normal anatomic configuration. No abnormal intra or extra-axial mass lesion or fluid collection. No abnormal mass effect or midline shift. No evidence of acute intracranial hemorrhage or infarct. Ventricular size is normal. Cerebellum unremarkable. Vascular: Moderate atherosclerotic calcification within the carotid siphons. No asymmetric hyperdense vasculature at the skull base. Skull: Intact Sinuses/Orbits: Paranasal sinuses are clear. Orbits are unremarkable. Other: Mastoid air cells and middle ear cavities are clear. IMPRESSION: No acute intracranial abnormality. Electronically Signed   By: Fidela Salisbury MD   On: 08/25/2020 03:27   CT Angio Chest PE W and/or Wo Contrast  Result Date: 08/25/2020 CLINICAL DATA:  Nausea and vomiting with abdominal and chest pain, history of prior COVID-19 infection EXAM: CT ANGIOGRAPHY CHEST CT ABDOMEN AND PELVIS WITH CONTRAST TECHNIQUE: Multidetector CT imaging of the chest was performed using the standard protocol during bolus  administration of intravenous contrast. Multiplanar CT image reconstructions and MIPs were obtained to evaluate the vascular anatomy. Multidetector CT imaging of the abdomen and pelvis was performed using the standard protocol during bolus administration of intravenous contrast. CONTRAST:  114mL OMNIPAQUE IOHEXOL 350 MG/ML SOLN COMPARISON:  07/19/2020 FINDINGS: CTA CHEST FINDINGS Cardiovascular: Heart is at the upper limits of normal in size. Aortic calcifications are noted. Coronary calcifications are seen as well. No dissection or aneurysmal dilatation is noted. The pulmonary artery shows a normal branching pattern bilaterally. No filling defects to suggest pulmonary emboli are noted. Mediastinum/Nodes: Thoracic inlet is within normal limits. No hilar or mediastinal adenopathy is noted. The esophagus is significantly dilated with air and ingested food stuffs. This may also be related to reflux. No significant wall thickening is noted. Lungs/Pleura: The lungs are well aerated bilaterally. Some patchy ground-glass airspace opacities are noted primarily in the right upper lobe likely related to the prior COVID-19  infection. No focal infiltrate or effusion is seen. No sizable parenchymal nodule is noted. Musculoskeletal: Degenerative changes of the thoracic spine are noted. Healed sternal fracture is noted. Review of the MIP images confirms the above findings. CT ABDOMEN and PELVIS FINDINGS Hepatobiliary: No focal liver abnormality is seen. No gallstones, gallbladder wall thickening, or biliary dilatation. Pancreas: Unremarkable. No pancreatic ductal dilatation or surrounding inflammatory changes. Spleen: Normal in size without focal abnormality. Adrenals/Urinary Tract: Adrenal glands are within normal limits. Kidneys demonstrate a normal enhancement pattern. Normal excretion is noted bilaterally. No obstructive changes are noted. Bladder is decompressed. Stomach/Bowel: No obstructive or inflammatory changes of the  colon are seen. The appendix is not well visualized. No inflammatory changes to suggest appendicitis are noted. Small bowel and stomach appear within normal limits. Vascular/Lymphatic: Aortic atherosclerosis. No enlarged abdominal or pelvic lymph nodes. Reproductive: Uterus and bilateral adnexa are unremarkable. Other: No abdominal wall hernia or abnormality. No abdominopelvic ascites. Musculoskeletal: Degenerative changes of the lumbar spine Review of the MIP images confirms the above findings. IMPRESSION: CTA of the chest: No filling defects to suggest pulmonary emboli are identified. Patchy ground-glass opacities in the right upper lobe consistent with the prior COVID-19 infection. Significant dilatation of the esophagus with air and ingested food stuffs. This may be related to reflux but is of uncertain chronicity. CT of the abdomen and pelvis: No acute abnormality in the abdomen pelvis is noted. Electronically Signed   By: Inez Catalina M.D.   On: 08/25/2020 00:26   MR BRAIN WO CONTRAST  Result Date: 08/25/2020 CLINICAL DATA:  Neuro deficit, acute, stroke suspected. Altered mental status. Confusion. Elevated troponin. EXAM: MRI HEAD WITHOUT CONTRAST TECHNIQUE: Multiplanar, multiecho pulse sequences of the brain and surrounding structures were obtained without intravenous contrast. COMPARISON:  CT head without contrast 08/25/2020 FINDINGS: Brain: No acute infarct, hemorrhage, or mass lesion is present. Moderate generalized atrophy is most prominent in the parietal lobes bilaterally. Periventricular T2 hyperintensities are mildly advanced for age. Focal T2 hyperintensity is present in the right corona radiata. Scattered subcortical T2 hyperintensities are mildly advanced for age is well. No acute infarct, hemorrhage, or mass lesion is present. Basal ganglia are intact. The internal auditory canals are within normal limits. The brainstem and cerebellum are within normal limits. Vascular: Flow is present in the  major intracranial arteries. Skull and upper cervical spine: The craniocervical junction is normal. Upper cervical spine is within normal limits. Marrow signal is unremarkable. Sinuses/Orbits: The paranasal sinuses and mastoid air cells are clear. The globes and orbits are within normal limits. IMPRESSION: 1. No acute intracranial abnormality. 2. Moderate generalized atrophy and white matter disease is mildly advanced for age. This likely reflects the sequela of chronic microvascular ischemia. Electronically Signed   By: San Morelle M.D.   On: 08/25/2020 12:03   CT ABDOMEN PELVIS W CONTRAST  Result Date: 08/25/2020 CLINICAL DATA:  Nausea and vomiting with abdominal and chest pain, history of prior COVID-19 infection EXAM: CT ANGIOGRAPHY CHEST CT ABDOMEN AND PELVIS WITH CONTRAST TECHNIQUE: Multidetector CT imaging of the chest was performed using the standard protocol during bolus administration of intravenous contrast. Multiplanar CT image reconstructions and MIPs were obtained to evaluate the vascular anatomy. Multidetector CT imaging of the abdomen and pelvis was performed using the standard protocol during bolus administration of intravenous contrast. CONTRAST:  122mL OMNIPAQUE IOHEXOL 350 MG/ML SOLN COMPARISON:  07/19/2020 FINDINGS: CTA CHEST FINDINGS Cardiovascular: Heart is at the upper limits of normal in size. Aortic calcifications are noted. Coronary  calcifications are seen as well. No dissection or aneurysmal dilatation is noted. The pulmonary artery shows a normal branching pattern bilaterally. No filling defects to suggest pulmonary emboli are noted. Mediastinum/Nodes: Thoracic inlet is within normal limits. No hilar or mediastinal adenopathy is noted. The esophagus is significantly dilated with air and ingested food stuffs. This may also be related to reflux. No significant wall thickening is noted. Lungs/Pleura: The lungs are well aerated bilaterally. Some patchy ground-glass airspace  opacities are noted primarily in the right upper lobe likely related to the prior COVID-19 infection. No focal infiltrate or effusion is seen. No sizable parenchymal nodule is noted. Musculoskeletal: Degenerative changes of the thoracic spine are noted. Healed sternal fracture is noted. Review of the MIP images confirms the above findings. CT ABDOMEN and PELVIS FINDINGS Hepatobiliary: No focal liver abnormality is seen. No gallstones, gallbladder wall thickening, or biliary dilatation. Pancreas: Unremarkable. No pancreatic ductal dilatation or surrounding inflammatory changes. Spleen: Normal in size without focal abnormality. Adrenals/Urinary Tract: Adrenal glands are within normal limits. Kidneys demonstrate a normal enhancement pattern. Normal excretion is noted bilaterally. No obstructive changes are noted. Bladder is decompressed. Stomach/Bowel: No obstructive or inflammatory changes of the colon are seen. The appendix is not well visualized. No inflammatory changes to suggest appendicitis are noted. Small bowel and stomach appear within normal limits. Vascular/Lymphatic: Aortic atherosclerosis. No enlarged abdominal or pelvic lymph nodes. Reproductive: Uterus and bilateral adnexa are unremarkable. Other: No abdominal wall hernia or abnormality. No abdominopelvic ascites. Musculoskeletal: Degenerative changes of the lumbar spine Review of the MIP images confirms the above findings. IMPRESSION: CTA of the chest: No filling defects to suggest pulmonary emboli are identified. Patchy ground-glass opacities in the right upper lobe consistent with the prior COVID-19 infection. Significant dilatation of the esophagus with air and ingested food stuffs. This may be related to reflux but is of uncertain chronicity. CT of the abdomen and pelvis: No acute abnormality in the abdomen pelvis is noted. Electronically Signed   By: Inez Catalina M.D.   On: 08/25/2020 00:26   DG ESOPHAGUS W SINGLE CM (SOL OR THIN BA)  Result  Date: 08/25/2020 CLINICAL DATA:  Dysphagia. History of esophagitis and esophageal stricture. EXAM: ESOPHOGRAM/BARIUM SWALLOW TECHNIQUE: Single contrast examination was performed using  thin barium. FLUOROSCOPY TIME:  Fluoroscopy Time:  4 seconds Radiation Exposure Index (if provided by the fluoroscopic device): 15.8 mGy COMPARISON:  CT 08/25/2020. FINDINGS: Limited exam due to the patient's clinical condition. The cervical esophagus is grossly unremarkable. Upper thoracic esophagus is again noted to be dilated and tapers at the level of the upper thoracic esophagus at which level there is irregular narrowing. Esophageal malignancy cannot be excluded. Barium empties into the stomach. No reflux was demonstrated. IMPRESSION: Limited exam due to patient's clinical condition. Irregular narrowing of the upper thoracic esophagus is noted. Esophageal malignancy cannot be excluded. Associated proximal esophageal distention is again noted. Electronically Signed   By: Marcello Moores  Register   On: 08/25/2020 12:23      Impression / Plan:   Donna Aguirre is a 65 y.o. y/o female with a prior history of esophageal strictures it was very tight and dilated to 10 mm.  Patient did not follow-up subsequently with Dr. Marius Ditch.  Presented to the hospital with dysphagia.  Barium esophagogram shows irregularity in the upper esophagus.   Plan  1.  EGD tomorrow, correct low electrolytes , clear liquids till midnight then NPO 2.  IV PPI  I have discussed alternative options, risks &  benefits,  which include, but are not limited to, bleeding, infection, perforation,respiratory complication & drug reaction.  The patient agrees with this plan & written consent will be obtained.    Thank you for involving me in the care of this patient.      LOS: 0 days   Jonathon Bellows, MD  08/25/2020, 3:26 PM

## 2020-08-25 NOTE — ED Notes (Signed)
Rn asked patient about any recent stressors and pt began rambling about her family and speaking to a counselor. Pt also stated that she feels like she is getting old and does not have any energy.  Pt remains cooperative with care and responsive to verbal stimuli although somnolent. Dr Leonides Schanz notified.

## 2020-08-25 NOTE — ED Notes (Signed)
Med verification requested from pharmacy.

## 2020-08-25 NOTE — ED Notes (Signed)
Dr Posey Pronto messaged via secure to chat to request swallow eval d/t previous RN stating pt did not pass swallow screen and spitting up water after drinking

## 2020-08-25 NOTE — Consult Note (Addendum)
Neurology Consultation Reason for Consult: altered mental status  Requesting Physician: Eugenie Norrie   CC: Abdominal pain and nasal congestion  History is obtained from: Patient and chart review, attempted to reach daughter but she did not answer the phone  HPI: Donna Aguirre is a 65 y.o. female with a past medical history of alcohol use, hypertension, esophageal stricture, severe erosive esophagitis, tobacco abuse  She was recently admitted from 07/19/2020 to 07/21/2020 for abdominal pain attributed to COVID-19 gastroenteritis without respiratory symptoms or hypoxia.  She was treated with IV fluids and symptoms were improved at the time of discharge.  She was hypokalemic and hypomagnesemic at the time, again attributed to her GI distress.  She was started on blood pressure medications for labile blood pressures up to 200s.  Patient denied symptoms of alcohol withdrawal when she does not drink but daughter was significantly concerned about ongoing alcohol abuse and patient was treated with thiamine, multivitamin and folic acid.  To me the patient reports that her daughter is overly worried about her and that she has never been confused but she simply did not speak up because her daughter was talking.  The patient notes that sometimes people have difficulty understanding her own speech.  She reports she has been cutting down on her alcohol use on her own, cautiously due to wanting to avoid serious withdrawal complications.  Of note the patient's accent/dialect is frequently difficult to understand but within this barrier, she does appear to provide reasonable history, though it is tangential at times.  She denies any concern for seizure-like episodes including bowel or bladder incontinence, tongue bites, loss of consciousness, or intermittent confusion.  She reports she is currently smoking about 1 to 2 cigarettes a day  On ED arrival the patient's blood pressures were in the 200s but have fluctuated on  their own from 114/63 up to 209/101 without administration of antihypertensive medications.  Per chart review, the patient had been having slurred speech and been sleepy for a couple of days.  ROS: All other review of systems was negative except as noted in the HPI.   Past Medical History:  Diagnosis Date  . Abdominal pain 11/22/2019  . Arthritis   . Diabetes mellitus without complication ([place])   . Gout   . Hypertension   . Hypokalemia 11/22/2019  . Hypomagnesemia 11/22/2019  . Hyponatremia 11/16/2018   Past Surgical History:  Procedure Laterality Date  . ESOPHAGOGASTRODUODENOSCOPY (EGD) WITH PROPOFOL N/A 11/23/2019   Procedure: ESOPHAGOGASTRODUODENOSCOPY (EGD) WITH PROPOFOL;  Surgeon: [name], MD;  Location: [place];  Service: Gastroenterology;  Laterality: N/A;  . perforated gastric ulcer     Current Meds  Medication Sig  . lisinopril (ZESTRIL) 20 MG tablet Take 1 tablet (20 mg total) by mouth daily.  . metoprolol succinate (TOPROL-XL) 25 MG 24 hr tablet Take 1 tablet (25 mg total) by mouth daily.  Marland Kitchen omeprazole (PRILOSEC) 40 MG capsule Take 1 capsule (40 mg total) by mouth 2 (two) times daily before a meal.     Family History  Problem Relation Age of Onset  . Hypertension Mother   . Hypertension Father     Social History:  reports that she has been smoking cigarettes. Her smokeless tobacco use includes snuff. She reports current alcohol use of about 7.0 standard drinks of alcohol per week. She reports that she does not use drugs.   Exam: Current vital signs: BP (!) 200/82   Pulse 80   Temp (!) 97.1 F (36.2  C) (Rectal)   Resp 19   Ht 5\' 4"  (1.626 m)   Wt 66.2 kg   SpO2 100%   BMI 25.06 kg/m  Vital signs in last 24 hours: Temp:  [97.1 F (36.2 C)-97.6 F (36.4 C)] 97.1 F (36.2 C) (03/29 0257) Pulse Rate:  [64-87] 80 (03/29 1100) Resp:  [12-21] 19 (03/29 1100) BP: (114-209)/(63-107) 200/82 (03/29 1100) SpO2:  [80 %-100 %] 100 % (03/29  1100) Weight:  [66.2 kg] 66.2 kg (03/28 2126)   Physical Exam  Constitutional: Appears thin Psych: Affect appropriate to situation, calm and cooperative Eyes: No scleral injection HENT: No oropharyngeal obstruction.  MSK: no joint deformities.  Cardiovascular: Perfusing extremities well Respiratory: Effort normal, non-labored breathing GI: Soft.  No distension. There is no tenderness.  Skin: Warm dry and intact visible skin other than a healing abrasion on her left knee and some chronic spots on her right lower extremity  Neuro: Mental Status: Patient is awake, alert, oriented to person, place, month, year, and situation. Patient is able to give a clear and coherent history though tangential and difficult to understand secondary to her accent/dialect at times No signs of aphasia or neglect At times inattentive (and does not follow all multistep commands in the correct order).  She additionally has some difficulty with commands such as heel-to-shin testing, requiring multiple repetitions and demonstration to execute these maneuvers Cranial Nerves: II: Visual Fields are full. Pupils are equal, round, and reactive to light 3 to 2 mm III,IV, VI: EOMI without ptosis or diploplia, though the pursuits are saccadic.  V: Facial sensation is symmetric to temperature and light touch VII: Facial movement is symmetric.  VIII: hearing is intact to voice X: Uvula elevates symmetrically XI: Shoulder shrug is symmetric. XII: tongue is midline without atrophy or fasciculations.  Motor: Tone is normal. Bulk is normal. 5/5 strength was present in all four extremities other than bilateral hip flexion weakness 4/5 Sensory: Sensation is symmetric to light touch and temperature in the arms and legs. Deep Tendon Reflexes: 3+ and symmetric in the biceps and 2+ and symmetric in the patellae.  Plantars: Toes are downgoing bilaterally.  Cerebellar: FNF and HKS are intact bilaterally    I have reviewed  labs in epic and the results pertinent to this consultation are:  Hyponatremia to 128, hypokalemia to 3.0, hypochloride to 92, elevated glucose at 117, BUN less than 5, creatinine 0.41, lipase 59 (reference range 11-51)  CBC notable for a microcytic anemia (hemoglobin 11.3, MCV 78.1), otherwise normal white blood cell count and platelets (5.6 and 302 respectively)  U tox negative.  UA notable for small hemoglobin pigment and ketones, negative for leukocytes nitrites  Ethanol level undetectable on admission  Ammonia 24  Lab Results  Component Value Date   VITAMINB12 798 11/22/2019    No results found for: HIV1X2   No results found for: RPR   I have reviewed the images obtained: Head CT with some atherosclerosis but no acute intracranial process MRI brain without acute intracranial process though it does have some atrophy  Impression: This is a 65 year old woman with a past medical history significant for alcohol abuse presenting with electrolyte derangements and UA consistent with ketosis all suggestive of in adequate oral intake in the setting of esophagitis/dysphagia.  Imaging suggestive of atrophy, likely secondary to chronic microvascular disease/substance use history.  Thus there may be an element of dementia in this setting.  Unclear to me if she truly had a hypertensive encephalopathy given her  blood pressures have been labile this admission and more labile on prior admissions as well.  She may have some element of alcoholic autonomic neuropathy contributing to the lability of her blood pressures, though she denies length dependent sensory changes on examination.  Certainly the electrolyte derangements on admission may also have been contributing to her symptoms, though it is unclear what confusion there truly was, given I am unable to reach family for additional collateral and per notes patient has remained fully oriented  Recommendations: -HIV and RPR labs ordered to complete  treatable causes of encephalopathy work-up, results can be followed up by primary care physician -Empiric thiamine 500 mg every 8 hours while inpatient, or for up to 3 days, then resume 100 mg oral daily -Empiric B12 supplementation 1000 mcg daily -Continue alcohol cessation counseling -Consider outpatient neurocognitive evaluation if desired by patient/family  Lesleigh Noe MD-PhD [facility] [phone] [facility] coverage for [facility] is from 8 AM to 4 AM in-house and 4 PM to 8 PM by telephone/video. 8 PM to 8 AM emergent questions or overnight urgent questions should be addressed to Teleneurology On-call or [facility] neurohospitalist; contact information can be found on AMION

## 2020-08-25 NOTE — ED Notes (Signed)
Pt back from MRI , Transport requested

## 2020-08-25 NOTE — Progress Notes (Signed)
PT Cancellation Note  Patient Details Name: MAKAELA CANDO MRN: [id number] DOB: 11-18-1955   Cancelled Treatment:    Reason Eval/Treat Not Completed: Other (comment). Per discussion with MD, no formal need for PT consult. Will complete orders at this time. Discussed with mobility specialist. Please re-order if needs change.   Clanton Emanuelson 08/25/2020, 3:11 PM  Greggory Stallion, PT, DPT [phone]

## 2020-08-25 NOTE — ED Notes (Signed)
Speech advised if necessary to give meds crushed with apple sauce

## 2020-08-25 NOTE — Progress Notes (Addendum)
[place] [facility] [place] NAME: Donna Aguirre    MR#:  [id number]  DATE OF BIRTH:  May 11, 1956  SUBJECTIVE:   Came in with increasing confusion at home. Patient much alert and oriented. Has been having difficulty swallowing. Has history of alcohol abuse. Currently awake trying to eat food.  REVIEW OF SYSTEMS:   Review of Systems  Constitutional: Negative for chills, fever and weight loss.  HENT: Negative for ear discharge, ear pain and nosebleeds.   Eyes: Negative for blurred vision, pain and discharge.  Respiratory: Negative for sputum production, shortness of breath, wheezing and stridor.   Cardiovascular: Negative for chest pain, palpitations, orthopnea and PND.  Gastrointestinal: Negative for abdominal pain, diarrhea, nausea and vomiting.       Difficulty swallowing  Genitourinary: Negative for frequency and urgency.  Musculoskeletal: Negative for back pain and joint pain.  Neurological: Positive for weakness. Negative for sensory change, speech change and focal weakness.  Psychiatric/Behavioral: Negative for depression and hallucinations. The patient is not nervous/anxious.    Tolerating Diet: Tolerating PT:   DRUG ALLERGIES:  No Known Allergies  VITALS:  Blood pressure (!) 157/79, pulse 81, temperature 97.7 F (36.5 C), temperature source Oral, resp. rate 18, height 5\' 4"  (1.626 m), weight 66.2 kg, SpO2 100 %.  PHYSICAL EXAMINATION:   Physical Exam  GENERAL:  65 y.o.-year-old patient lying in the bed with no acute distress.  LUNGS: Normal breath sounds bilaterally, no wheezing, rales, rhonchi. No use of accessory muscles of respiration.  CARDIOVASCULAR: S1, S2 normal. No murmurs, rubs, or gallops.  ABDOMEN: Soft, nontender, nondistended. Bowel sounds present. No organomegaly or mass.  EXTREMITIES: No cyanosis, clubbing or edema b/l.    NEUROLOGIC: grossly nonfocal PSYCHIATRIC:  patient is alert and awake  SKIN: No obvious rash,  lesion, or ulcer.   LABORATORY PANEL:  CBC Recent Labs  Lab 08/24/20 2132  WBC 5.6  HGB 11.3*  HCT 33.8*  PLT 302    Chemistries  Recent Labs  Lab 08/24/20 2132 08/25/20 0053  NA 128*  --   K 3.0*  --   CL 92*  --   CO2 26  --   GLUCOSE 117*  --   BUN <5*  --   CREATININE 0.41*  --   CALCIUM 9.1  --   MG  --  2.2  AST 17  --   ALT 9  --   ALKPHOS 76  --   BILITOT 1.2  --    Cardiac Enzymes No results for input(s): TROPONINI in the last 168 hours. RADIOLOGY:  CT Head Wo Contrast  Result Date: 08/25/2020 CLINICAL DATA:  Altered mental status EXAM: CT HEAD WITHOUT CONTRAST TECHNIQUE: Contiguous axial images were obtained from the base of the skull through the vertex without intravenous contrast. COMPARISON:  None. FINDINGS: Brain: Normal anatomic configuration. No abnormal intra or extra-axial mass lesion or fluid collection. No abnormal mass effect or midline shift. No evidence of acute intracranial hemorrhage or infarct. Ventricular size is normal. Cerebellum unremarkable. Vascular: Moderate atherosclerotic calcification within the carotid siphons. No asymmetric hyperdense vasculature at the skull base. Skull: Intact Sinuses/Orbits: Paranasal sinuses are clear. Orbits are unremarkable. Other: Mastoid air cells and middle ear cavities are clear. IMPRESSION: No acute intracranial abnormality. Electronically Signed   By: Fidela Salisbury MD   On: 08/25/2020 03:27   CT Angio Chest PE W and/or Wo Contrast  Result Date: 08/25/2020 CLINICAL DATA:  Nausea and vomiting with abdominal  and chest pain, history of prior COVID-19 infection EXAM: CT ANGIOGRAPHY CHEST CT ABDOMEN AND PELVIS WITH CONTRAST TECHNIQUE: Multidetector CT imaging of the chest was performed using the standard protocol during bolus administration of intravenous contrast. Multiplanar CT image reconstructions and MIPs were obtained to evaluate the vascular anatomy. Multidetector CT imaging of the abdomen and pelvis was  performed using the standard protocol during bolus administration of intravenous contrast. CONTRAST:  188mL OMNIPAQUE IOHEXOL 350 MG/ML SOLN COMPARISON:  07/19/2020 FINDINGS: CTA CHEST FINDINGS Cardiovascular: Heart is at the upper limits of normal in size. Aortic calcifications are noted. Coronary calcifications are seen as well. No dissection or aneurysmal dilatation is noted. The pulmonary artery shows a normal branching pattern bilaterally. No filling defects to suggest pulmonary emboli are noted. Mediastinum/Nodes: Thoracic inlet is within normal limits. No hilar or mediastinal adenopathy is noted. The esophagus is significantly dilated with air and ingested food stuffs. This may also be related to reflux. No significant wall thickening is noted. Lungs/Pleura: The lungs are well aerated bilaterally. Some patchy ground-glass airspace opacities are noted primarily in the right upper lobe likely related to the prior COVID-19 infection. No focal infiltrate or effusion is seen. No sizable parenchymal nodule is noted. Musculoskeletal: Degenerative changes of the thoracic spine are noted. Healed sternal fracture is noted. Review of the MIP images confirms the above findings. CT ABDOMEN and PELVIS FINDINGS Hepatobiliary: No focal liver abnormality is seen. No gallstones, gallbladder wall thickening, or biliary dilatation. Pancreas: Unremarkable. No pancreatic ductal dilatation or surrounding inflammatory changes. Spleen: Normal in size without focal abnormality. Adrenals/Urinary Tract: Adrenal glands are within normal limits. Kidneys demonstrate a normal enhancement pattern. Normal excretion is noted bilaterally. No obstructive changes are noted. Bladder is decompressed. Stomach/Bowel: No obstructive or inflammatory changes of the colon are seen. The appendix is not well visualized. No inflammatory changes to suggest appendicitis are noted. Small bowel and stomach appear within normal limits. Vascular/Lymphatic: Aortic  atherosclerosis. No enlarged abdominal or pelvic lymph nodes. Reproductive: Uterus and bilateral adnexa are unremarkable. Other: No abdominal wall hernia or abnormality. No abdominopelvic ascites. Musculoskeletal: Degenerative changes of the lumbar spine Review of the MIP images confirms the above findings. IMPRESSION: CTA of the chest: No filling defects to suggest pulmonary emboli are identified. Patchy ground-glass opacities in the right upper lobe consistent with the prior COVID-19 infection. Significant dilatation of the esophagus with air and ingested food stuffs. This may be related to reflux but is of uncertain chronicity. CT of the abdomen and pelvis: No acute abnormality in the abdomen pelvis is noted. Electronically Signed   By: Inez Catalina M.D.   On: 08/25/2020 00:26   MR BRAIN WO CONTRAST  Result Date: 08/25/2020 CLINICAL DATA:  Neuro deficit, acute, stroke suspected. Altered mental status. Confusion. Elevated troponin. EXAM: MRI HEAD WITHOUT CONTRAST TECHNIQUE: Multiplanar, multiecho pulse sequences of the brain and surrounding structures were obtained without intravenous contrast. COMPARISON:  CT head without contrast 08/25/2020 FINDINGS: Brain: No acute infarct, hemorrhage, or mass lesion is present. Moderate generalized atrophy is most prominent in the parietal lobes bilaterally. Periventricular T2 hyperintensities are mildly advanced for age. Focal T2 hyperintensity is present in the right corona radiata. Scattered subcortical T2 hyperintensities are mildly advanced for age is well. No acute infarct, hemorrhage, or mass lesion is present. Basal ganglia are intact. The internal auditory canals are within normal limits. The brainstem and cerebellum are within normal limits. Vascular: Flow is present in the major intracranial arteries. Skull and upper cervical spine: The  craniocervical junction is normal. Upper cervical spine is within normal limits. Marrow signal is unremarkable. Sinuses/Orbits:  The paranasal sinuses and mastoid air cells are clear. The globes and orbits are within normal limits. IMPRESSION: 1. No acute intracranial abnormality. 2. Moderate generalized atrophy and white matter disease is mildly advanced for age. This likely reflects the sequela of chronic microvascular ischemia. Electronically Signed   By: San Morelle M.D.   On: 08/25/2020 12:03   CT ABDOMEN PELVIS W CONTRAST  Result Date: 08/25/2020 CLINICAL DATA:  Nausea and vomiting with abdominal and chest pain, history of prior COVID-19 infection EXAM: CT ANGIOGRAPHY CHEST CT ABDOMEN AND PELVIS WITH CONTRAST TECHNIQUE: Multidetector CT imaging of the chest was performed using the standard protocol during bolus administration of intravenous contrast. Multiplanar CT image reconstructions and MIPs were obtained to evaluate the vascular anatomy. Multidetector CT imaging of the abdomen and pelvis was performed using the standard protocol during bolus administration of intravenous contrast. CONTRAST:  116mL OMNIPAQUE IOHEXOL 350 MG/ML SOLN COMPARISON:  07/19/2020 FINDINGS: CTA CHEST FINDINGS Cardiovascular: Heart is at the upper limits of normal in size. Aortic calcifications are noted. Coronary calcifications are seen as well. No dissection or aneurysmal dilatation is noted. The pulmonary artery shows a normal branching pattern bilaterally. No filling defects to suggest pulmonary emboli are noted. Mediastinum/Nodes: Thoracic inlet is within normal limits. No hilar or mediastinal adenopathy is noted. The esophagus is significantly dilated with air and ingested food stuffs. This may also be related to reflux. No significant wall thickening is noted. Lungs/Pleura: The lungs are well aerated bilaterally. Some patchy ground-glass airspace opacities are noted primarily in the right upper lobe likely related to the prior COVID-19 infection. No focal infiltrate or effusion is seen. No sizable parenchymal nodule is noted.  Musculoskeletal: Degenerative changes of the thoracic spine are noted. Healed sternal fracture is noted. Review of the MIP images confirms the above findings. CT ABDOMEN and PELVIS FINDINGS Hepatobiliary: No focal liver abnormality is seen. No gallstones, gallbladder wall thickening, or biliary dilatation. Pancreas: Unremarkable. No pancreatic ductal dilatation or surrounding inflammatory changes. Spleen: Normal in size without focal abnormality. Adrenals/Urinary Tract: Adrenal glands are within normal limits. Kidneys demonstrate a normal enhancement pattern. Normal excretion is noted bilaterally. No obstructive changes are noted. Bladder is decompressed. Stomach/Bowel: No obstructive or inflammatory changes of the colon are seen. The appendix is not well visualized. No inflammatory changes to suggest appendicitis are noted. Small bowel and stomach appear within normal limits. Vascular/Lymphatic: Aortic atherosclerosis. No enlarged abdominal or pelvic lymph nodes. Reproductive: Uterus and bilateral adnexa are unremarkable. Other: No abdominal wall hernia or abnormality. No abdominopelvic ascites. Musculoskeletal: Degenerative changes of the lumbar spine Review of the MIP images confirms the above findings. IMPRESSION: CTA of the chest: No filling defects to suggest pulmonary emboli are identified. Patchy ground-glass opacities in the right upper lobe consistent with the prior COVID-19 infection. Significant dilatation of the esophagus with air and ingested food stuffs. This may be related to reflux but is of uncertain chronicity. CT of the abdomen and pelvis: No acute abnormality in the abdomen pelvis is noted. Electronically Signed   By: Inez Catalina M.D.   On: 08/25/2020 00:26   DG ESOPHAGUS W SINGLE CM (SOL OR THIN BA)  Result Date: 08/25/2020 CLINICAL DATA:  Dysphagia. History of esophagitis and esophageal stricture. EXAM: ESOPHOGRAM/BARIUM SWALLOW TECHNIQUE: Single contrast examination was performed using   thin barium. FLUOROSCOPY TIME:  Fluoroscopy Time:  4 seconds Radiation Exposure Index (if provided by  the fluoroscopic device): 15.8 mGy COMPARISON:  CT 08/25/2020. FINDINGS: Limited exam due to the patient's clinical condition. The cervical esophagus is grossly unremarkable. Upper thoracic esophagus is again noted to be dilated and tapers at the level of the upper thoracic esophagus at which level there is irregular narrowing. Esophageal malignancy cannot be excluded. Barium empties into the stomach. No reflux was demonstrated. IMPRESSION: Limited exam due to patient's clinical condition. Irregular narrowing of the upper thoracic esophagus is noted. Esophageal malignancy cannot be excluded. Associated proximal esophageal distention is again noted. Electronically Signed   By: Marcello Moores  Register   On: 08/25/2020 12:23   ASSESSMENT AND PLAN:  Donna Aguirre is a 65 y.o. African-American female with medical history significant for hypertension, hyponatremia, gout, type 2 diabetes mellitus and osteoarthritis, who presented to the emergency room with acute onset of altered mental status.  Per her daughter who called EMS today as the patient has been having confusion over the last couple of days.  Hypertensive urgency. - on as needed IV labetalol and will continue her antihypertensives. -- cont po metoprolol tartrate bid, lisinopril   Acute encephalopathy that is likely multifactorial.   -- MRI brain negative --Neurology consult with Dr. Curly Shores -- appreciate input recommends thiamine IV for three days and then oral supplement. Empiric supplementation of B12. - mentation better today  Dysphagia history of esophageal stricture -- seen by speech therapy. Patient able to tolerate liquid. Change to clear liquid -- G.I. consultation with Dr. Vicente Males. -- Barium swallow shows stricture in the upper esophagus. Will keep patient NPO after midnight for esophageal dilatation In am by G.I.  electrolyte  abnormality -- suspected poor PO intake with alcohol abuse -- pharmacy to replete electrolyte's -- continue IV fluids  Elevated lipase level. -This could be related to acute gastroenteritis.  GERD. -On PPI  TOC for discharge planning mobility therapist to work with patient  DVT prophylaxis: Lovenox. Code Status: full code. Family Communication:  dter Donna Aguirre  Disposition Plan: Back to previous home environment Consults called: GI, Neuro   Status is: Inpatient  Remains inpatient appropriate because:Altered mental status, Ongoing diagnostic testing needed not appropriate for outpatient work up  Dispo: The patient is from: Home  Anticipated d/c is to: Home  Patient currently is not medically stable to d/c.              Difficult to place patient No           TOTAL TIME TAKING CARE OF THIS PATIENT: *25* minutes.  >50% time spent on counselling and coordination of care  Note: This dictation was prepared with Dragon dictation along with smaller phrase technology. Any transcriptional errors that result from this process are unintentional.  Fritzi Mandes M.D    [facility]   CC: Primary care physician; Patient, No Pcp Per (Inactive)Patient ID: Donna Aguirre, female   DOB: 10/05/1955, 65 y.o.   MRN: [id number]

## 2020-08-25 NOTE — ED Notes (Signed)
Due to pyxis downtime, all due meds requested stat from pharmacy.

## 2020-08-25 NOTE — ED Notes (Signed)
RRT notified for ABG.

## 2020-08-25 NOTE — Evaluation (Addendum)
Clinical/Bedside Swallow Evaluation Patient Details  Name: Donna Aguirre MRN: [id number] Date of Birth: [birth year]  Today's Date: 08/25/2020 Time: SLP Start Time (ACUTE ONLY): 66 SLP Stop Time (ACUTE ONLY): 1120 SLP Time Calculation (min) (ACUTE ONLY): 60 min  Past Medical History:  Past Medical History:  Diagnosis Date  . Abdominal pain 11/22/2019  . Arthritis   . Diabetes mellitus without complication ([place])   . Gout   . Hypertension   . Hypokalemia 11/22/2019  . Hypomagnesemia 11/22/2019  . Hyponatremia 11/16/2018   Past Surgical History:  Past Surgical History:  Procedure Laterality Date  . ESOPHAGOGASTRODUODENOSCOPY (EGD) WITH PROPOFOL N/A 11/23/2019   Procedure: ESOPHAGOGASTRODUODENOSCOPY (EGD) WITH PROPOFOL;  Surgeon: [name], MD;  Location: [facility];  Service: Gastroenterology;  Laterality: N/A;  . perforated gastric ulcer     HPI:  Pt is a 65 y.o. African-American female with medical history significant for hypertension, hyponatremia, gout, type 2 diabetes mellitus and osteoarthritis, who presented to the emergency room with acute onset of altered mental status.  Per her daughter who called EMS today as the patient has been having confusion over the last couple of days.  She stated that she was having slurred speech and has been sleepy.  2 days ago she went to a funeral and was doing well been however after that she has been spacing out per her daughter.  She was positive for COVID-19 on 07/19/2020.  She has been having nasal congestion, sore throat and postnasal drip as well as diarrhea.  The patient stated that she was having nausea and vomiting.  Chest CT revealed: "patchy groundglass opacities in the right upper lobe consistent with prior COVID-19 infection.  There was significant dilatation in the esophagus with air and in digested food that may be related to Reflux".  CT of the abdomen and pelvis showed no acute abnormalities.  Noncontrasted head CT scan  revealed no acute intracranial abnormalities.  EGD in 10/2019 revealed: "esophageal stenoses. Dilated;  LA Grade D erosive esophagitis with no bleeding;  Erosive gastropathy with no bleeding".   Assessment / Plan / Recommendation Clinical Impression  Pt appears to present w/ adequate oropharyngeal phase swallowing function w/ No overt oropharyngeal phase dysphagia appreciated w/ few trials of consistencies given; No neuromuscular swallowing deficits appreciated. Pt is Edentulous at Baseline and does require increased oral phase time for mashing/gumming increased textured foods, but pt stated this is "usual" for her. Pt appears at reduced risk for aspiration of oral intake from an oropharyngeal phase standpoint following general aspiration precautions.  However, pt has a baseline of Esophageal phase Dysmotility and Issues. An EGD in 10/2019 revealed: "esophageal stenoses. Dilated;  LA Grade D erosive esophagitis with no bleeding;  Erosive gastropathy with no bleeding". Pt did not f/u for further management per chart notes, MD. ANY Dysmotility or Regurgitation of Reflux material and/or Phlegm can increase risk for aspiration of the Reflux material during Retrograde flow thus impact Voicing and Pulmonary status. Pt described ongoing issues w/ "it gets in my chest" and "pain here" pointing to upper-mid sternum area then described the discomfort moves distally pointing to her lower abdominal area. She stated it has been "worse" in the past ~1 week.  Pt sat upright in bed this evaluation and consumed few trials of thin liquids Via Cup (both luke warm and cold temps), then tsps of puree w/ no immediate, overt clinical s/s of aspiration noted; clear vocal quality b/t trials, no decline in pulmonary status, no multiple swallows noted  post initial pharyngeal swallow. Oral phase appeared [facility] for bolus management and timely A-P transfer/clearing of material. Audible swallows noted. OM exam was [facility] for oral clearing;  lingual/labial movements. No unilateral weakness; lingual strength appropriate. Noted oral cavity was bright/shiny w/ excess secretions. Speech clear.   Of Note, pt demonstrated fairly quick s/s of Esophageal irritation and Regurgitation -- felt a globus feeling ~3-5 trials into multiple sips of thin liquids, then 3 small tsps of puree pointing to upper-mid sternum area. She endorsed discomfort and felt the need to hock/spit a moderate amount of clear, bubbly mucous (this same hock/spit of mucous was noted PRIOR to any oral intake - suspect mucous/irritation of Esophagus). Pt also endorsed mild nasal drip -- this can be a result of impact of Reflux/GERD as well.  If per MD ok, recommend a Clear liquids diet(thin consistency) Via Cup. No straw use d/t air swallowing; general aspiration precautions. Rest Breaks during meals/oral intake to allow for Esophageal clearing. Pt should have monitoring w/ all oral intake, especially w/ any oral intake prior to GI intervention, tx. Stop po's if pt is not tolerating or if consistent Regurgitation noted. REFLUX precautions strongly recommended to lessen chance for Regurgitation. Recommend pt f/u w/ GI for management of Reflux/GERD and tx as indicated w/ assessment of Esophageal motility currently -- either DG Esophagus and/or EGD for direct view and possible need for Dilation. Disucssion and handouts given on Reflux. MD to reconsult ST services if any new needs while admitted. NSG to discuss w/ MD that if any Medications are necessary, then to give Crushed in a Puree.  SLP Visit Diagnosis: Dysphagia, unspecified (R13.10) (Esophageal phase dysmotility - chronic)    Aspiration Risk   (reduced from an oropharyngeal phase standpoint)    Diet Recommendation  clear liquid diet(thin consistency) VIA CUP(no straws) - if per MD ok. General aspiration precautions; REFLUX/GERD precautions.   Medication Administration: Crushed with puree (necessary meds per MD/NSG)    Other   Recommendations Recommended Consults: Consider GI evaluation;Consider esophageal assessment Oral Care Recommendations: Oral care BID;Patient independent with oral care Other Recommendations:  (n/a)   Follow up Recommendations None      Frequency and Duration  (n/a)   (n/a)       Prognosis Prognosis for Safe Diet Advancement: Fair Barriers to Reach Goals: Time post onset;Severity of deficits;Behavior;Motivation Barriers/Prognosis Comment: ETOH use      Swallow Study   General Date of Onset: 08/24/20 HPI: Pt is a 65 y.o. African-American female with medical history significant for hypertension, hyponatremia, gout, type 2 diabetes mellitus and osteoarthritis, who presented to the emergency room with acute onset of altered mental status.  Per her daughter who called EMS today as the patient has been having confusion over the last couple of days.  She stated that she was having slurred speech and has been sleepy.  2 days ago she went to a funeral and was doing well been however after that she has been spacing out per her daughter.  She was positive for COVID-19 on 07/19/2020.  She has been having nasal congestion, sore throat and postnasal drip as well as diarrhea.  The patient stated that she was having nausea and vomiting.  Chest CT revealed: "patchy groundglass opacities in the right upper lobe consistent with prior COVID-19 infection.  There was significant dilatation in the esophagus with air and in digested food that may be related to Reflux".  CT of the abdomen and pelvis showed no acute abnormalities.  Noncontrasted head  CT scan revealed no acute intracranial abnormalities.  EGD in 10/2019 revealed: "esophageal stenoses. Dilated;  LA Grade D erosive esophagitis with no bleeding;  Erosive gastropathy with no bleeding". Type of Study: Bedside Swallow Evaluation Previous Swallow Assessment: none noted Diet Prior to this Study: NPO (regular diet at home) Temperature Spikes Noted: No (wbc  5.6) Respiratory Status: Room air History of Recent Intubation: No Behavior/Cognition: Alert;Cooperative;Pleasant mood;Distractible;Requires cueing Oral Cavity Assessment: Excessive secretions (shiny, bright oral cavity) Oral Care Completed by SLP: Yes Oral Cavity - Dentition: Edentulous Vision: Functional for self-feeding Self-Feeding Abilities: Able to feed self;Needs set up Patient Positioning: Upright in bed (helped to sit fully upright) Baseline Vocal Quality: Normal (though Edentulous -- reduced articulation precision) Volitional Cough: Strong Volitional Swallow: Able to elicit    Oral/Motor/Sensory Function Overall Oral Motor/Sensory Function: Within functional limits   Ice Chips Ice chips: Within functional limits Presentation: Spoon (3 trials)   Thin Liquid Thin Liquid: Within functional limits Presentation: Cup;Self Fed (12+) Other Comments: Time b/t 2-3 sips for esophageal clearing    Nectar Thick Nectar Thick Liquid: Not tested   Honey Thick Honey Thick Liquid: Not tested   Puree Puree: Within functional limits Presentation: Spoon;Self Fed (6 trials) Other Comments: Time b/t trials as w/ liquids; small boluses   Solid     Solid: Not tested Other Comments: d/t Esophageal dysmotility         Orinda Kenner, MS, CCC-SLP Speech Language Pathologist Rehab Services [phone] [facility] 08/25/2020,12:44 PM

## 2020-08-25 NOTE — H&P (Addendum)
[place]   PATIENT NAME: Donna Aguirre    MR#:  [id number]  DATE OF BIRTH:  11-09-1955  DATE OF ADMISSION:  08/25/2020  PRIMARY CARE PHYSICIAN: Patient, No Pcp Per   Patient is coming from: Home  REQUESTING/REFERRING PHYSICIAN: Ward, Delice Bison, DO  CHIEF COMPLAINT:   Chief Complaint  Patient presents with  . Abdominal Pain  . Nasal Congestion    HISTORY OF PRESENT ILLNESS:  Donna Aguirre is a 65 y.o. African-American female with medical history significant for hypertension, hyponatremia, gout, type 2 diabetes mellitus and osteoarthritis, who presented to the emergency room with acute onset of altered mental status.  Per her daughter who called EMS today as the patient has been having confusion over the last couple of days.  She stated that she was having slurred speech and has been sleepy.  2 days ago she went to a funeral and was doing well been however after that she has been spacing out per her daughter.  She was positive for COVID-19 on 07/19/2020.  She has been having nasal congestion,  sore throat and postnasal drip as well as diarrhea.  The patient stated that she was having nausea and vomiting however her daughter did not endorse that.  No reported chest pain or palpitations.  No worsening cough or wheezing.  ED Course: Upon presentation to the emergency room blood pressure was 192/107 with otherwise normal vital signs.  Labs revealed hyponatremia 128 and hypochloremia of 92, hypokalemia of 3 and serum lipase of 59.  CBC showed anemia close to previous levels.  Urinalysis was unremarkable.  Urine drug screen was negative and alcohol level was less than 10.  ABG was within normal.  Procalcitonin was less than 0.1.  High-sensitivity troponin I was 23, 44 and later 77 and magnesium was 2.2. EKG as reviewed by me : ShockThe patient is placed in inpatient statusShowed normal sinus rhythm with a rate of 86 with first-degree AV block and left axis deviation with poor R wave  progression Imaging: CT of the chest showed no evidence for PE.  It showed patchy groundglass opacities in the right upper lobe consistent with prior COVID-19 infection.  There was significant dilatation in the esophagus with air and in digested food that may be related to reflux.  CT of the abdomen and pelvis showed no acute abnormalities.  Noncontrasted head CT scan revealed no acute intracranial abnormalities.  The patient was given 0.4 mg of IV Narcan, 4 mg IV Zofran twice, 40 mill a gram of IV Protonix, 40 mEq p.o. potassium chloride and 2 g of IV magnesium sulfate as well as 1 L bolus of IV normal saline.  She will be admitted to a medically monitored bed for further evaluation and management. PAST MEDICAL HISTORY:   Past Medical History:  Diagnosis Date  . Abdominal pain 11/22/2019  . Arthritis   . Diabetes mellitus without complication ([place])   . Gout   . Hypertension   . Hypokalemia 11/22/2019  . Hypomagnesemia 11/22/2019  . Hyponatremia 11/16/2018    PAST SURGICAL HISTORY:   Past Surgical History:  Procedure Laterality Date  . ESOPHAGOGASTRODUODENOSCOPY (EGD) WITH PROPOFOL N/A 11/23/2019   Procedure: ESOPHAGOGASTRODUODENOSCOPY (EGD) WITH PROPOFOL;  Surgeon: [name], MD;  Location: [facility];  Service: Gastroenterology;  Laterality: N/A;  . perforated gastric ulcer      SOCIAL HISTORY:   Social History   Tobacco Use  . Smoking status: Current Some Day Smoker  Types: Cigarettes  . Smokeless tobacco: Current User    Types: Snuff  Substance Use Topics  . Alcohol use: Yes    Alcohol/week: 7.0 standard drinks    Types: 7 Cans of beer per week    Comment: one 40 oz. states she drinks everytime some one brings her something     FAMILY HISTORY:   Family History  Problem Relation Age of Onset  . Hypertension Mother   . Hypertension Father     DRUG ALLERGIES:  No Known Allergies  REVIEW OF SYSTEMS:   ROS As per history of present illness. All  pertinent systems were reviewed above. Constitutional, HEENT, cardiovascular, respiratory, GI, GU, musculoskeletal, neuro, psychiatric, endocrine, integumentary and hematologic systems were reviewed and are otherwise negative/unremarkable except for positive findings mentioned above in the HPI.   MEDICATIONS AT HOME:   Prior to Admission medications   Medication Sig Start Date End Date Taking? Authorizing Provider  lisinopril (ZESTRIL) 20 MG tablet Take 1 tablet (20 mg total) by mouth daily. 07/22/20 10/20/20 Yes [name], MD  metoprolol succinate (TOPROL-XL) 25 MG 24 hr tablet Take 1 tablet (25 mg total) by mouth daily. 07/21/20 10/19/20 Yes [name], MD  omeprazole (PRILOSEC) 40 MG capsule Take 1 capsule (40 mg total) by mouth 2 (two) times daily before a meal. 01/22/20 04/21/20 Yes Vanga, Tally Due, MD      VITAL SIGNS:  Blood pressure 114/63, pulse 69, temperature (!) 97.1 F (36.2 C), temperature source Rectal, resp. rate 16, height 5\' 4"  (1.626 m), weight 66.2 kg, SpO2 100 %.  PHYSICAL EXAMINATION:  Physical Exam  GENERAL:  65 y.o.-year-old African-American female patient lying in the bed with no acute distress.  EYES: Pupils equal, round, reactive to light and accommodation. No scleral icterus. Extraocular muscles intact.  HEENT: Head atraumatic, normocephalic. Oropharynx and nasopharynx clear.  NECK:  Supple, no jugular venous distention. No thyroid enlargement, no tenderness.  LUNGS: Normal breath sounds bilaterally, no wheezing, rales,rhonchi or crepitation. No use of accessory muscles of respiration.  CARDIOVASCULAR: Regular rate and rhythm, S1, S2 normal. No murmurs, rubs, or gallops.  ABDOMEN: Soft, nondistended, nontender. Bowel sounds present. No organomegaly or mass.  EXTREMITIES: No pedal edema, cyanosis, or clubbing.  NEUROLOGIC: Cranial nerves II through XII are intact. Muscle strength 5/5 in all extremities. Sensation intact. Gait not checked.  PSYCHIATRIC: The  patient is alert and oriented x 3.  Normal affect and good eye contact. SKIN: No obvious rash, lesion, or ulcer.   LABORATORY PANEL:   CBC Recent Labs  Lab 08/24/20 2132  WBC 5.6  HGB 11.3*  HCT 33.8*  PLT 302   ------------------------------------------------------------------------------------------------------------------  Chemistries  Recent Labs  Lab 08/24/20 2132 08/25/20 0053  NA 128*  --   K 3.0*  --   CL 92*  --   CO2 26  --   GLUCOSE 117*  --   BUN <5*  --   CREATININE 0.41*  --   CALCIUM 9.1  --   MG  --  2.2  AST 17  --   ALT 9  --   ALKPHOS 76  --   BILITOT 1.2  --    ------------------------------------------------------------------------------------------------------------------  Cardiac Enzymes No results for input(s): TROPONINI in the last 168 hours. ------------------------------------------------------------------------------------------------------------------  RADIOLOGY:  CT Head Wo Contrast  Result Date: 08/25/2020 CLINICAL DATA:  Altered mental status EXAM: CT HEAD WITHOUT CONTRAST TECHNIQUE: Contiguous axial images were obtained from the base of the skull through the vertex without intravenous  contrast. COMPARISON:  None. FINDINGS: Brain: Normal anatomic configuration. No abnormal intra or extra-axial mass lesion or fluid collection. No abnormal mass effect or midline shift. No evidence of acute intracranial hemorrhage or infarct. Ventricular size is normal. Cerebellum unremarkable. Vascular: Moderate atherosclerotic calcification within the carotid siphons. No asymmetric hyperdense vasculature at the skull base. Skull: Intact Sinuses/Orbits: Paranasal sinuses are clear. Orbits are unremarkable. Other: Mastoid air cells and middle ear cavities are clear. IMPRESSION: No acute intracranial abnormality. Electronically Signed   By: Fidela Salisbury MD   On: 08/25/2020 03:27   CT Angio Chest PE W and/or Wo Contrast  Result Date: 08/25/2020 CLINICAL  DATA:  Nausea and vomiting with abdominal and chest pain, history of prior COVID-19 infection EXAM: CT ANGIOGRAPHY CHEST CT ABDOMEN AND PELVIS WITH CONTRAST TECHNIQUE: Multidetector CT imaging of the chest was performed using the standard protocol during bolus administration of intravenous contrast. Multiplanar CT image reconstructions and MIPs were obtained to evaluate the vascular anatomy. Multidetector CT imaging of the abdomen and pelvis was performed using the standard protocol during bolus administration of intravenous contrast. CONTRAST:  175mL OMNIPAQUE IOHEXOL 350 MG/ML SOLN COMPARISON:  07/19/2020 FINDINGS: CTA CHEST FINDINGS Cardiovascular: Heart is at the upper limits of normal in size. Aortic calcifications are noted. Coronary calcifications are seen as well. No dissection or aneurysmal dilatation is noted. The pulmonary artery shows a normal branching pattern bilaterally. No filling defects to suggest pulmonary emboli are noted. Mediastinum/Nodes: Thoracic inlet is within normal limits. No hilar or mediastinal adenopathy is noted. The esophagus is significantly dilated with air and ingested food stuffs. This may also be related to reflux. No significant wall thickening is noted. Lungs/Pleura: The lungs are well aerated bilaterally. Some patchy ground-glass airspace opacities are noted primarily in the right upper lobe likely related to the prior COVID-19 infection. No focal infiltrate or effusion is seen. No sizable parenchymal nodule is noted. Musculoskeletal: Degenerative changes of the thoracic spine are noted. Healed sternal fracture is noted. Review of the MIP images confirms the above findings. CT ABDOMEN and PELVIS FINDINGS Hepatobiliary: No focal liver abnormality is seen. No gallstones, gallbladder wall thickening, or biliary dilatation. Pancreas: Unremarkable. No pancreatic ductal dilatation or surrounding inflammatory changes. Spleen: Normal in size without focal abnormality.  Adrenals/Urinary Tract: Adrenal glands are within normal limits. Kidneys demonstrate a normal enhancement pattern. Normal excretion is noted bilaterally. No obstructive changes are noted. Bladder is decompressed. Stomach/Bowel: No obstructive or inflammatory changes of the colon are seen. The appendix is not well visualized. No inflammatory changes to suggest appendicitis are noted. Small bowel and stomach appear within normal limits. Vascular/Lymphatic: Aortic atherosclerosis. No enlarged abdominal or pelvic lymph nodes. Reproductive: Uterus and bilateral adnexa are unremarkable. Other: No abdominal wall hernia or abnormality. No abdominopelvic ascites. Musculoskeletal: Degenerative changes of the lumbar spine Review of the MIP images confirms the above findings. IMPRESSION: CTA of the chest: No filling defects to suggest pulmonary emboli are identified. Patchy ground-glass opacities in the right upper lobe consistent with the prior COVID-19 infection. Significant dilatation of the esophagus with air and ingested food stuffs. This may be related to reflux but is of uncertain chronicity. CT of the abdomen and pelvis: No acute abnormality in the abdomen pelvis is noted. Electronically Signed   By: Inez Catalina M.D.   On: 08/25/2020 00:26   CT ABDOMEN PELVIS W CONTRAST  Result Date: 08/25/2020 CLINICAL DATA:  Nausea and vomiting with abdominal and chest pain, history of prior COVID-19 infection EXAM: CT ANGIOGRAPHY  CHEST CT ABDOMEN AND PELVIS WITH CONTRAST TECHNIQUE: Multidetector CT imaging of the chest was performed using the standard protocol during bolus administration of intravenous contrast. Multiplanar CT image reconstructions and MIPs were obtained to evaluate the vascular anatomy. Multidetector CT imaging of the abdomen and pelvis was performed using the standard protocol during bolus administration of intravenous contrast. CONTRAST:  155mL OMNIPAQUE IOHEXOL 350 MG/ML SOLN COMPARISON:  07/19/2020  FINDINGS: CTA CHEST FINDINGS Cardiovascular: Heart is at the upper limits of normal in size. Aortic calcifications are noted. Coronary calcifications are seen as well. No dissection or aneurysmal dilatation is noted. The pulmonary artery shows a normal branching pattern bilaterally. No filling defects to suggest pulmonary emboli are noted. Mediastinum/Nodes: Thoracic inlet is within normal limits. No hilar or mediastinal adenopathy is noted. The esophagus is significantly dilated with air and ingested food stuffs. This may also be related to reflux. No significant wall thickening is noted. Lungs/Pleura: The lungs are well aerated bilaterally. Some patchy ground-glass airspace opacities are noted primarily in the right upper lobe likely related to the prior COVID-19 infection. No focal infiltrate or effusion is seen. No sizable parenchymal nodule is noted. Musculoskeletal: Degenerative changes of the thoracic spine are noted. Healed sternal fracture is noted. Review of the MIP images confirms the above findings. CT ABDOMEN and PELVIS FINDINGS Hepatobiliary: No focal liver abnormality is seen. No gallstones, gallbladder wall thickening, or biliary dilatation. Pancreas: Unremarkable. No pancreatic ductal dilatation or surrounding inflammatory changes. Spleen: Normal in size without focal abnormality. Adrenals/Urinary Tract: Adrenal glands are within normal limits. Kidneys demonstrate a normal enhancement pattern. Normal excretion is noted bilaterally. No obstructive changes are noted. Bladder is decompressed. Stomach/Bowel: No obstructive or inflammatory changes of the colon are seen. The appendix is not well visualized. No inflammatory changes to suggest appendicitis are noted. Small bowel and stomach appear within normal limits. Vascular/Lymphatic: Aortic atherosclerosis. No enlarged abdominal or pelvic lymph nodes. Reproductive: Uterus and bilateral adnexa are unremarkable. Other: No abdominal wall hernia or  abnormality. No abdominopelvic ascites. Musculoskeletal: Degenerative changes of the lumbar spine Review of the MIP images confirms the above findings. IMPRESSION: CTA of the chest: No filling defects to suggest pulmonary emboli are identified. Patchy ground-glass opacities in the right upper lobe consistent with the prior COVID-19 infection. Significant dilatation of the esophagus with air and ingested food stuffs. This may be related to reflux but is of uncertain chronicity. CT of the abdomen and pelvis: No acute abnormality in the abdomen pelvis is noted. Electronically Signed   By: Inez Catalina M.D.   On: 08/25/2020 00:26      IMPRESSION AND PLAN:  Active Problems:   Acute encephalopathy  1.  Hypertensive urgency. -The patient will be admitted to a medical monitored bed. -She will be placed on as needed IV labetalol and will continue her antihypertensives. -This could be contributing to hypertensive cephalopathy.  2.  Acute encephalopathy that is likely multifactorial.  This could be related to her hypertensive urgency and therefore hypertensive encephalopathy.  Hyponatremia could be contributing as well.  We will need to rule out CVA.  She also has elevated troponin I and will need to rule out ACS that could be contributing to her altered mental status. -The patient will be followed with neuro checks every 4 hours for 24 hours. -We will manage her hypertensive urgency as mentioned above. -PT and ST consults will be obtained especially given her slurred speech.   -Neurology consult to be obtained. -I notified Dr. Curly Sh[place] about  the patient. -We will also obtain a brain MRI especially given her slurred speech. -We will follow serial troponin I's. -The patient will be placed on aspirin. -We will obtain a cardiology consultation. -I notified Dr. Humphrey Rolls about the patient.  3.  Hypokalemia. -Potassium will be replaced and magnesium level came back normal.  4.  Hyponatremia and hypochloremia,  likely secondary to hypovolemia and dehydration given her recurrent diarrhea and possibly nausea and vomiting per the patient's report. -The patient will be hydrated with IV normal saline and will follow her BMP.  5.  Elevated lipase level. -This could be related to acute gastroenteritis. -We will follow lipase levels.  6.  GERD. -The patient will be placed on IV PPI therapy.  DVT prophylaxis: Lovenox. Code Status: full code. Family Communication:  The plan of care was discussed in details with the patient (and her daughter earlier). I answered all questions. The patient agreed to proceed with the above mentioned plan. Further management will depend upon hospital course. Disposition Plan: Back to previous home environment Consults called: none.   All the records are reviewed and case discussed with ED provider.  Status is: Inpatient  Remains inpatient appropriate because:Altered mental status, Ongoing diagnostic testing needed not appropriate for outpatient work up, Unsafe d/c plan, IV treatments appropriate due to intensity of illness or inability to take PO and Inpatient level of care appropriate due to severity of illness   Dispo: The patient is from: Home              Anticipated d/c is to: Home              Patient currently is not medically stable to d/c.   Difficult to place patient No  TOTAL TIME TAKING CARE OF THIS PATIENT: 55 minutes.    Christel Mormon M.D on 08/25/2020 at 5:53 AM  [facility]   From 7 PM-7 AM, contact night-coverage [email or web address]  CC: Primary care physician; Patient, No Pcp Per

## 2020-08-26 ENCOUNTER — Inpatient Hospital Stay: Payer: Medicaid Other | Admitting: Anesthesiology

## 2020-08-26 ENCOUNTER — Encounter: Payer: Self-pay | Admitting: Family Medicine

## 2020-08-26 ENCOUNTER — Inpatient Hospital Stay
Admit: 2020-08-26 | Discharge: 2020-08-26 | Disposition: A | Discharge status: Home / Self Care | Payer: Medicaid Other | Attending: Nurse Practitioner | Admitting: Nurse Practitioner

## 2020-08-26 ENCOUNTER — Encounter
Admission: EM | Disposition: A | Discharge status: Home / Self Care | Payer: Self-pay | Source: Home / Self Care | Attending: Family Medicine

## 2020-08-26 DIAGNOSIS — K222 Esophageal obstruction: Secondary | ICD-10-CM

## 2020-08-26 DIAGNOSIS — R1310 Dysphagia, unspecified: Secondary | ICD-10-CM | POA: Diagnosis not present

## 2020-08-26 DIAGNOSIS — I10 Essential (primary) hypertension: Secondary | ICD-10-CM

## 2020-08-26 HISTORY — PX: ESOPHAGOGASTRODUODENOSCOPY: SHX5428

## 2020-08-26 LAB — BASIC METABOLIC PANEL
Anion gap: 10 (ref 5–15)
BUN: 6 mg/dL — ABNORMAL LOW (ref 8–23)
CO2: 22 mmol/L (ref 22–32)
Calcium: 9 mg/dL (ref 8.9–10.3)
Chloride: 98 mmol/L (ref 98–111)
Creatinine, Ser: 0.55 mg/dL (ref 0.44–1.00)
GFR, Estimated: >60 mL/min (ref >60)
Glucose, Bld: 84 mg/dL (ref 70–99)
Potassium: 3.2 mmol/L — ABNORMAL LOW (ref 3.5–5.1)
Sodium: 130 mmol/L — ABNORMAL LOW (ref 135–145)

## 2020-08-26 LAB — CBC
HCT: 33.3 % — ABNORMAL LOW (ref 36.0–46.0)
Hemoglobin: 11.5 g/dL — ABNORMAL LOW (ref 12.0–15.0)
MCH: 26.4 pg (ref 26.0–34.0)
MCHC: 34.5 g/dL (ref 30.0–36.0)
MCV: 76.6 fL — ABNORMAL LOW (ref 80.0–100.0)
Platelets: 351 10*3/uL (ref 150–400)
RBC: 4.35 MIL/uL (ref 3.87–5.11)
RDW: 15.4 % (ref 11.5–15.5)
WBC: 5.1 10*3/uL (ref 4.0–10.5)
nRBC: 0 % (ref 0.0–0.2)

## 2020-08-26 LAB — RPR: RPR Ser Ql: NONREACTIVE

## 2020-08-26 SURGERY — EGD (ESOPHAGOGASTRODUODENOSCOPY)
Anesthesia: General

## 2020-08-26 MED ORDER — POTASSIUM CHLORIDE CRYS ER 20 MEQ PO TBCR
40.0000 meq | EXTENDED_RELEASE_TABLET | ORAL | Status: AC
  Administered 2020-08-26 (×2): 40 meq via ORAL
  Filled 2020-08-26 (×2): qty 2

## 2020-08-26 MED ORDER — ONDANSETRON HCL 4 MG/2ML IJ SOLN: 4.0000 mg | Freq: Once | INTRAMUSCULAR | Status: DC | PRN

## 2020-08-26 MED ORDER — PROPOFOL 10 MG/ML IV BOLUS
INTRAVENOUS | Status: DC | PRN
  Administered 2020-08-26: 30 mg via INTRAVENOUS
  Administered 2020-08-26 (×2): 10 mg via INTRAVENOUS
  Administered 2020-08-26 (×3): 20 mg via INTRAVENOUS
  Administered 2020-08-26: 10 mg via INTRAVENOUS
  Administered 2020-08-26: 70 mg via INTRAVENOUS

## 2020-08-26 MED ORDER — LIDOCAINE HCL (CARDIAC) PF 100 MG/5ML IV SOSY
PREFILLED_SYRINGE | INTRAVENOUS | Status: DC | PRN
  Administered 2020-08-26: 80 mg via INTRAVENOUS

## 2020-08-26 MED ORDER — FENTANYL CITRATE (PF) 100 MCG/2ML IJ SOLN: 25.0000 ug | INTRAMUSCULAR | Status: DC | PRN

## 2020-08-26 MED ORDER — HYDRALAZINE HCL 20 MG/ML IJ SOLN
10.0000 mg | Freq: Four times a day (QID) | INTRAMUSCULAR | Status: DC | PRN
  Administered 2020-08-26 – 2020-08-27 (×2): 10 mg via INTRAVENOUS
  Filled 2020-08-26 (×2): qty 1

## 2020-08-26 MED ORDER — SODIUM CHLORIDE 0.9 % IV SOLN
INTRAVENOUS | Status: DC
  Administered 2020-08-26: 1000 mL via INTRAVENOUS

## 2020-08-26 NOTE — Progress Notes (Signed)
PROGRESS NOTE    Donna Aguirre  EXH:[id number] DOB: 03-26-1956 DOA: 08/25/2020 [facility]CP: [facility]   Brief Narrative: Donna Aguirre is a 65 y.o. female with a history of hypertension, hyponatremia, gout, alcohol abuse, osteoarthritis. Patient presented secondary to worsening confusion with associated slurred speech.  On admission she was noted to have evidence of hypertensive urgency and was started on antihypertensives for treatment.  MRI was obtained and was negative for acute stroke.  Neurology was consulted and recommended high-dose thiamine treatment in addition to empiric vitamin B12 supplementation.  While admitted, patient was found to have evidence of dysphagia with stricture found on barium swallow requiring EGD with dilation.  Cardiology was consulted for hypertensive urgency and associated elevated troponin.  Mental status has improved.   Assessment & Plan:   Active Problems:   Dysphagia   Encephalopathy   Electrolyte abnormality  Hypertensive urgency Possible hypertensive emergency if contributed to encephalopathy.  Associated elevated troponin.  No associated chest pain.  Patient is on lisinopril, metoprolol succinate as an outpatient which were continued.  Blood pressure better controlled on this regimen.  Cardiology consulted. -Continue lisinopril in addition to metoprolol -Cardiology recommendations: Echocardiogram pending  Acute encephalopathy Unsure of specific etiology.  Patient was significantly hypertensive on admission.  Possibly multifactorial.  MRI brain was negative for acute stroke.  Neurology consult on admission with recommendation for high-dose thiamine therapy and vitamin B12 therapy.  They also recommended checking HIV (nonreactive), RPR (nonreactive) labs.  Overall, encephalopathy has resolved. -Neurology recommendations: Thiamine 500 mg IV x3 days followed by continued thiamine supplementation; vitamin B12 supplementation;  consideration of outpatient neurocognitive evaluation  Dysphagia Patient was evaluated by speech therapy who recommended clear liquid diet.  Barium swallow was obtained which was significant for upper esophageal stricture.  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  Patient now advance to soft diet with recommendation to continue for two weeks.  Patient will need outpatient GI follow-up in two weeks.  Hyponatremia Mild.  Unlikely that this was contributing to mental status issues.  Improving with IV fluid resuscitation.  Elevated lipase Very mild.  No associated symptoms consistent with pancreatitis.  GERD -Continue Protonix  History of alcohol abuse Patient states she does not drink much now. No history of alcohol withdrawal per patient. CIWA not started on admission.   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                        - Benign-appearing esophageal stenosis. Dilated.                        - Normal examined duodenum.                        - Normal stomach.                        - No specimens collected.  Recommendation:        - Return patient to hospital ward for ongoing care.                        - Mechanical soft diet for 2 weeks.                        - Repeat upper endoscopy in 2 weeks for retreatment.                        - F/u with DR Marius Ditch in clinic  Antimicrobials:  None    Subjective: No concerns this morning. Not confused per her  report.  Objective: Vitals:   08/26/20 1250 08/26/20 1300 08/26/20 1415 08/26/20 1425  BP:   (!) 156/70 (!) 176/86  Pulse:   (!) 105 (!) 107  Resp: (!) 23 20 (!) 25   Temp:   (!) 97.1 F (36.2 C)   TempSrc:   Temporal   SpO2:   100% 100%  Weight:      Height:        Intake/Output Summary (Last 24 hours) at 08/26/2020 1433 Last data filed at 08/25/2020 1707 Gross per 24 hour  Intake 929.38 ml  Output --  Net 929.38 ml   Filed Weights   08/24/20 2126  Weight: 66.2 kg    Examination:  General exam: Appears calm and comfortable Respiratory system: Clear to auscultation. Respiratory effort normal. Cardiovascular system: S1 & S2 heard, RRR. Systolic murmur Gastrointestinal system: Abdomen is nondistended, soft and nontender. No organomegaly or masses felt. Normal bowel sounds heard. Central nervous system: Alert and oriented. No focal neurological deficits. Musculoskeletal: No edema. No calf tenderness Skin: No cyanosis. No rashes Psychiatry: Judgement and insight appear normal. Mood & affect appropriate.     Data Reviewed: I have personally reviewed following labs and imaging studies  CBC Lab Results  Component Value Date   WBC 5.1 08/26/2020   RBC 4.35 08/26/2020   HGB 11.5 (L) 08/26/2020   HCT 33.3 (L) 08/26/2020   MCV 76.6 (L) 08/26/2020   MCH 26.4 08/26/2020   PLT 351 08/26/2020   MCHC 34.5 08/26/2020   RDW 15.4 08/26/2020   LYMPHSABS 0.8 07/20/2020   MONOABS 0.4 07/20/2020   EOSABS 0.0 07/20/2020   BASOSABS 0.0 16/02/9603     Last metabolic panel Lab Results  Component Value Date   NA 130 (L) 08/26/2020   K 3.2 (L) 08/26/2020   CL 98 08/26/2020   CO2 22 08/26/2020   BUN 6 (L) 08/26/2020   CREATININE 0.55 08/26/2020   GLUCOSE 84 08/26/2020   GFRNONAA >60 08/26/2020   GFRAA >60 11/24/2019   CALCIUM 9.0 08/26/2020   PHOS 2.4 (L) 07/20/2020   PROT 9.0 (H) 08/24/2020   ALBUMIN 4.1 08/24/2020   BILITOT 1.2 08/24/2020   ALKPHOS 76 08/24/2020    AST 17 08/24/2020   ALT 9 08/24/2020   ANIONGAP 10 08/26/2020    CBG (last 3)  Recent Labs    08/25/20 0500  GLUCAP 100*     GFR: Estimated Creatinine Clearance: 66.5 mL/min (by C-G formula based on SCr of 0.55 mg/dL).  Coagulation Profile: No results for input(s): INR, PROTIME in the last 168 hours.  Recent Results (from the past 240 hour(s))  Resp Panel by RT-PCR (Flu A&B, Covid) Nasopharyngeal Swab     Status: None   Collection Time: 08/25/20 10:48 AM  Specimen: Nasopharyngeal Swab; Nasopharyngeal(NP) swabs in vial transport medium  Result Value Ref Range Status   SARS Coronavirus 2 by RT PCR NEGATIVE NEGATIVE Final    Comment: (NOTE) SARS-CoV-2 target nucleic acids are NOT DETECTED.  The SARS-CoV-2 RNA is generally detectable in upper respiratory specimens during the acute phase of infection. The lowest concentration of SARS-CoV-2 viral copies this assay can detect is 138 copies/mL. A negative result does not preclude SARS-Cov-2 infection and should not be used as the sole basis for treatment or other patient management decisions. A negative result may occur with  improper specimen collection/handling, submission of specimen other than nasopharyngeal swab, presence of viral mutation(s) within the areas targeted by this assay, and inadequate number of viral copies(<138 copies/mL). A negative result must be combined with clinical observations, patient history, and epidemiological information. The expected result is Negative.  Fact Sheet for Patients:  EntrepreneurPulse.com.au  Fact Sheet for Healthcare Providers:  IncredibleEmployment.be  This test is no t yet approved or cleared by the [place] FDA and  has been authorized for detection and/or diagnosis of SARS-CoV-2 by FDA under an Emergency Use Authorization (EUA). This EUA will remain  in effect (meaning this test can be used) for the duration of the COVID-19 declaration  under Section 564(b)(1) of the Act, 21 U.S.C.section 360bbb-3(b)(1), unless the authorization is terminated  or revoked sooner.       Influenza A by PCR NEGATIVE NEGATIVE Final   Influenza B by PCR NEGATIVE NEGATIVE Final    Comment: (NOTE) The Xpert Xpress SARS-CoV-2/FLU/RSV plus assay is intended as an aid in the diagnosis of influenza from Nasopharyngeal swab specimens and should not be used as a sole basis for treatment. Nasal washings and aspirates are unacceptable for Xpert Xpress SARS-CoV-2/FLU/RSV testing.  Fact Sheet for Patients: EntrepreneurPulse.com.au  Fact Sheet for Healthcare Providers: IncredibleEmployment.be  This test is not yet approved or cleared by the [place] FDA and has been authorized for detection and/or diagnosis of SARS-CoV-2 by FDA under an Emergency Use Authorization (EUA). This EUA will remain in effect (meaning this test can be used) for the duration of the COVID-19 declaration under Section 564(b)(1) of the Act, 21 U.S.C. section 360bbb-3(b)(1), unless the authorization is terminated or revoked.  Performed at [facility], [address], [place], [place] [address]         Radiology Studies: CT Head Wo Contrast  Result Date: 08/25/2020 CLINICAL DATA:  Altered mental status EXAM: CT HEAD WITHOUT CONTRAST TECHNIQUE: Contiguous axial images were obtained from the base of the skull through the vertex without intravenous contrast. COMPARISON:  None. FINDINGS: Brain: Normal anatomic configuration. No abnormal intra or extra-axial mass lesion or fluid collection. No abnormal mass effect or midline shift. No evidence of acute intracranial hemorrhage or infarct. Ventricular size is normal. Cerebellum unremarkable. Vascular: Moderate atherosclerotic calcification within the carotid siphons. No asymmetric hyperdense vasculature at the skull base. Skull: Intact Sinuses/Orbits: Paranasal sinuses are clear.  Orbits are unremarkable. Other: Mastoid air cells and middle ear cavities are clear. IMPRESSION: No acute intracranial abnormality. Electronically Signed   By: Fidela Salisbury MD   On: 08/25/2020 03:27   CT Angio Chest PE W and/or Wo Contrast  Result Date: 08/25/2020 CLINICAL DATA:  Nausea and vomiting with abdominal and chest pain, history of prior COVID-19 infection EXAM: CT ANGIOGRAPHY CHEST CT ABDOMEN AND PELVIS WITH CONTRAST TECHNIQUE: Multidetector CT imaging of the chest was performed using the standard protocol during bolus administration of intravenous contrast. Multiplanar  CT image reconstructions and MIPs were obtained to evaluate the vascular anatomy. Multidetector CT imaging of the abdomen and pelvis was performed using the standard protocol during bolus administration of intravenous contrast. CONTRAST:  119mL OMNIPAQUE IOHEXOL 350 MG/ML SOLN COMPARISON:  07/19/2020 FINDINGS: CTA CHEST FINDINGS Cardiovascular: Heart is at the upper limits of normal in size. Aortic calcifications are noted. Coronary calcifications are seen as well. No dissection or aneurysmal dilatation is noted. The pulmonary artery shows a normal branching pattern bilaterally. No filling defects to suggest pulmonary emboli are noted. Mediastinum/Nodes: Thoracic inlet is within normal limits. No hilar or mediastinal adenopathy is noted. The esophagus is significantly dilated with air and ingested food stuffs. This may also be related to reflux. No significant wall thickening is noted. Lungs/Pleura: The lungs are well aerated bilaterally. Some patchy ground-glass airspace opacities are noted primarily in the right upper lobe likely related to the prior COVID-19 infection. No focal infiltrate or effusion is seen. No sizable parenchymal nodule is noted. Musculoskeletal: Degenerative changes of the thoracic spine are noted. Healed sternal fracture is noted. Review of the MIP images confirms the above findings. CT ABDOMEN and PELVIS  FINDINGS Hepatobiliary: No focal liver abnormality is seen. No gallstones, gallbladder wall thickening, or biliary dilatation. Pancreas: Unremarkable. No pancreatic ductal dilatation or surrounding inflammatory changes. Spleen: Normal in size without focal abnormality. Adrenals/Urinary Tract: Adrenal glands are within normal limits. Kidneys demonstrate a normal enhancement pattern. Normal excretion is noted bilaterally. No obstructive changes are noted. Bladder is decompressed. Stomach/Bowel: No obstructive or inflammatory changes of the colon are seen. The appendix is not well visualized. No inflammatory changes to suggest appendicitis are noted. Small bowel and stomach appear within normal limits. Vascular/Lymphatic: Aortic atherosclerosis. No enlarged abdominal or pelvic lymph nodes. Reproductive: Uterus and bilateral adnexa are unremarkable. Other: No abdominal wall hernia or abnormality. No abdominopelvic ascites. Musculoskeletal: Degenerative changes of the lumbar spine Review of the MIP images confirms the above findings. IMPRESSION: CTA of the chest: No filling defects to suggest pulmonary emboli are identified. Patchy ground-glass opacities in the right upper lobe consistent with the prior COVID-19 infection. Significant dilatation of the esophagus with air and ingested food stuffs. This may be related to reflux but is of uncertain chronicity. CT of the abdomen and pelvis: No acute abnormality in the abdomen pelvis is noted. Electronically Signed   By: Inez Catalina M.D.   On: 08/25/2020 00:26   MR BRAIN WO CONTRAST  Result Date: 08/25/2020 CLINICAL DATA:  Neuro deficit, acute, stroke suspected. Altered mental status. Confusion. Elevated troponin. EXAM: MRI HEAD WITHOUT CONTRAST TECHNIQUE: Multiplanar, multiecho pulse sequences of the brain and surrounding structures were obtained without intravenous contrast. COMPARISON:  CT head without contrast 08/25/2020 FINDINGS: Brain: No acute infarct, hemorrhage,  or mass lesion is present. Moderate generalized atrophy is most prominent in the parietal lobes bilaterally. Periventricular T2 hyperintensities are mildly advanced for age. Focal T2 hyperintensity is present in the right corona radiata. Scattered subcortical T2 hyperintensities are mildly advanced for age is well. No acute infarct, hemorrhage, or mass lesion is present. Basal ganglia are intact. The internal auditory canals are within normal limits. The brainstem and cerebellum are within normal limits. Vascular: Flow is present in the major intracranial arteries. Skull and upper cervical spine: The craniocervical junction is normal. Upper cervical spine is within normal limits. Marrow signal is unremarkable. Sinuses/Orbits: The paranasal sinuses and mastoid air cells are clear. The globes and orbits are within normal limits. IMPRESSION: 1. No acute intracranial  abnormality. 2. Moderate generalized atrophy and white matter disease is mildly advanced for age. This likely reflects the sequela of chronic microvascular ischemia. Electronically Signed   By: San Morelle M.D.   On: 08/25/2020 12:03   CT ABDOMEN PELVIS W CONTRAST  Result Date: 08/25/2020 CLINICAL DATA:  Nausea and vomiting with abdominal and chest pain, history of prior COVID-19 infection EXAM: CT ANGIOGRAPHY CHEST CT ABDOMEN AND PELVIS WITH CONTRAST TECHNIQUE: Multidetector CT imaging of the chest was performed using the standard protocol during bolus administration of intravenous contrast. Multiplanar CT image reconstructions and MIPs were obtained to evaluate the vascular anatomy. Multidetector CT imaging of the abdomen and pelvis was performed using the standard protocol during bolus administration of intravenous contrast. CONTRAST:  19mL OMNIPAQUE IOHEXOL 350 MG/ML SOLN COMPARISON:  07/19/2020 FINDINGS: CTA CHEST FINDINGS Cardiovascular: Heart is at the upper limits of normal in size. Aortic calcifications are noted. Coronary  calcifications are seen as well. No dissection or aneurysmal dilatation is noted. The pulmonary artery shows a normal branching pattern bilaterally. No filling defects to suggest pulmonary emboli are noted. Mediastinum/Nodes: Thoracic inlet is within normal limits. No hilar or mediastinal adenopathy is noted. The esophagus is significantly dilated with air and ingested food stuffs. This may also be related to reflux. No significant wall thickening is noted. Lungs/Pleura: The lungs are well aerated bilaterally. Some patchy ground-glass airspace opacities are noted primarily in the right upper lobe likely related to the prior COVID-19 infection. No focal infiltrate or effusion is seen. No sizable parenchymal nodule is noted. Musculoskeletal: Degenerative changes of the thoracic spine are noted. Healed sternal fracture is noted. Review of the MIP images confirms the above findings. CT ABDOMEN and PELVIS FINDINGS Hepatobiliary: No focal liver abnormality is seen. No gallstones, gallbladder wall thickening, or biliary dilatation. Pancreas: Unremarkable. No pancreatic ductal dilatation or surrounding inflammatory changes. Spleen: Normal in size without focal abnormality. Adrenals/Urinary Tract: Adrenal glands are within normal limits. Kidneys demonstrate a normal enhancement pattern. Normal excretion is noted bilaterally. No obstructive changes are noted. Bladder is decompressed. Stomach/Bowel: No obstructive or inflammatory changes of the colon are seen. The appendix is not well visualized. No inflammatory changes to suggest appendicitis are noted. Small bowel and stomach appear within normal limits. Vascular/Lymphatic: Aortic atherosclerosis. No enlarged abdominal or pelvic lymph nodes. Reproductive: Uterus and bilateral adnexa are unremarkable. Other: No abdominal wall hernia or abnormality. No abdominopelvic ascites. Musculoskeletal: Degenerative changes of the lumbar spine Review of the MIP images confirms the above  findings. IMPRESSION: CTA of the chest: No filling defects to suggest pulmonary emboli are identified. Patchy ground-glass opacities in the right upper lobe consistent with the prior COVID-19 infection. Significant dilatation of the esophagus with air and ingested food stuffs. This may be related to reflux but is of uncertain chronicity. CT of the abdomen and pelvis: No acute abnormality in the abdomen pelvis is noted. Electronically Signed   By: Inez Catalina M.D.   On: 08/25/2020 00:26   DG ESOPHAGUS W SINGLE CM (SOL OR THIN BA)  Result Date: 08/25/2020 CLINICAL DATA:  Dysphagia. History of esophagitis and esophageal stricture. EXAM: ESOPHOGRAM/BARIUM SWALLOW TECHNIQUE: Single contrast examination was performed using  thin barium. FLUOROSCOPY TIME:  Fluoroscopy Time:  4 seconds Radiation Exposure Index (if provided by the fluoroscopic device): 15.8 mGy COMPARISON:  CT 08/25/2020. FINDINGS: Limited exam due to the patient's clinical condition. The cervical esophagus is grossly unremarkable. Upper thoracic esophagus is again noted to be dilated and tapers at the level of  the upper thoracic esophagus at which level there is irregular narrowing. Esophageal malignancy cannot be excluded. Barium empties into the stomach. No reflux was demonstrated. IMPRESSION: Limited exam due to patient's clinical condition. Irregular narrowing of the upper thoracic esophagus is noted. Esophageal malignancy cannot be excluded. Associated proximal esophageal distention is again noted. Electronically Signed   By: Marcello Moores  Register   On: 08/25/2020 12:23        Scheduled Meds: . [MAR Hold] enoxaparin (LOVENOX) injection  40 mg Subcutaneous Q24H  . [MAR Hold] fluticasone  2 spray Each Nare Daily  . [MAR Hold] lisinopril  20 mg Oral Daily  . [MAR Hold] metoprolol tartrate  12.5 mg Oral BID  . [MAR Hold] pantoprazole sodium  40 mg Oral Daily  . [MAR Hold] potassium chloride  40 mEq Oral Once  . [MAR Hold] vitamin B-12  1,000  mcg Oral Daily   Continuous Infusions: . sodium chloride 100 mL/hr at 08/26/20 1400  . sodium chloride 20 mL/hr at 08/26/20 1400  . [MAR Hold] thiamine injection 500 mg (08/26/20 0556)     LOS: 1 day     Cordelia Poche, MD [facility] 08/26/2020, 2:33 PM  If 7PM-7AM, please contact night-coverage [email or web address]

## 2020-08-26 NOTE — H&P (Signed)
Jonathon Bellows, MD [address], [place], [place], [place], [address] [id number], [place], [place], [place], [address] Phone: [phone]  Fax: [phone]  Primary Care Physician:  [facility]   Pre-Procedure History & Physical: HPI:  Donna Aguirre is a 65 y.o. female is here for an endoscopy    Past Medical History:  Diagnosis Date  . Abdominal pain 11/22/2019  . Arthritis   . Diabetes mellitus without complication ([place])   . Gout   . Hypertension   . Hypokalemia 11/22/2019  . Hypomagnesemia 11/22/2019  . Hyponatremia 11/16/2018    Past Surgical History:  Procedure Laterality Date  . ESOPHAGOGASTRODUODENOSCOPY (EGD) WITH PROPOFOL N/A 11/23/2019   Procedure: ESOPHAGOGASTRODUODENOSCOPY (EGD) WITH PROPOFOL;  Surgeon: [name], MD;  Location: [facility];  Service: Gastroenterology;  Laterality: N/A;  . perforated gastric ulcer      Prior to Admission medications   Medication Sig Start Date End Date Taking? Authorizing Provider  lisinopril (ZESTRIL) 20 MG tablet Take 1 tablet (20 mg total) by mouth daily. 07/22/20 10/20/20 Yes [name], MD  metoprolol succinate (TOPROL-XL) 25 MG 24 hr tablet Take 1 tablet (25 mg total) by mouth daily. 07/21/20 10/19/20 Yes [name], MD  omeprazole (PRILOSEC) 40 MG capsule Take 1 capsule (40 mg total) by mouth 2 (two) times daily before a meal. 01/22/20 04/21/20 Yes Vanga, Tally Due, MD    Allergies as of 08/24/2020  . (No Known Allergies)    Family History  Problem Relation Age of Onset  . Hypertension Mother   . Hypertension Father     Social History   Socioeconomic History  . Marital status: Single    Spouse name: Not on file  . Number of children: Not on file  . Years of education: Not on file  . Highest education level: Not on file  Occupational History  . Not on file  Tobacco Use  . Smoking status: Current Some Day Smoker    Packs/day: 0.50    Types: Cigarettes  .  Smokeless tobacco: Current User    Types: Snuff  Vaping Use  . Vaping Use: Never used  Substance and Sexual Activity  . Alcohol use: Yes    Alcohol/week: 7.0 standard drinks    Types: 7 Cans of beer per week    Comment: one 40 oz. states she drinks everytime some one brings her something   . Drug use: No  . Sexual activity: Not on file  Other Topics Concern  . Not on file  Social History Narrative  . Not on file   Social Determinants of Health   Financial Resource Strain: Not on file  Food Insecurity: Not on file  Transportation Needs: Not on file  Physical Activity: Not on file  Stress: Not on file  Social Connections: Not on file  Intimate Partner Violence: Not on file    Review of Systems: See HPI, otherwise negative ROS  Physical Exam: BP 138/64 (BP Location: Right Arm)   Pulse 85   Temp (!) 97.5 F (36.4 C) (Oral)   Resp 16   Ht 5\' 4"  (1.626 m)   Wt 66.2 kg   SpO2 100%   BMI 25.06 kg/m  General:   Alert,  pleasant and cooperative in NAD Head:  Normocephalic and atraumatic. Neck:  Supple; no masses or thyromegaly. Lungs:  Clear throughout to auscultation, normal respiratory effort.    Heart:  +S1, +S2, Regular rate and rhythm, No edema.  Abdomen:  Soft, nontender and nondistended. Normal bowel sounds, without guarding, and without rebound.   Neurologic:  Alert and  oriented x4;  grossly normal neurologically.  Impression/Plan: Donna Aguirre is here for an endoscopy  to be performed for  evaluation of dysphagia    Risks, benefits, limitations, and alternatives regarding endoscopy have been reviewed with the patient.  Questions have been answered.  All parties agreeable.   Jonathon Bellows, MD  08/26/2020, 1:57 PM

## 2020-08-26 NOTE — Progress Notes (Signed)
SUBJECTIVE: Patient resting comfortably in bed. Denies chest pain or shortness of breath. Denies any confusion.   Vitals:   08/26/20 0100 08/26/20 0433 08/26/20 0755 08/26/20 1104  BP: (!) 185/93 (!) 148/68 110/63 138/64  Pulse: 94 87 75 85  Resp: 20 16 17 16   Temp: 98.3 F (36.8 C) 97.8 F (36.6 C) 98.2 F (36.8 C) (!) 97.5 F (36.4 C)  TempSrc: Oral   Oral  SpO2: 99% 100% 100% 100%  Weight:      Height:        Intake/Output Summary (Last 24 hours) at 08/26/2020 1144 Last data filed at 08/25/2020 1707 Gross per 24 hour  Intake 929.38 ml  Output --  Net 929.38 ml    LABS: Basic Metabolic Panel: Recent Labs    08/24/20 2132 08/25/20 0053 08/26/20 0504  NA 128*  --  130*  K 3.0*  --  3.2*  CL 92*  --  98  CO2 26  --  22  GLUCOSE 117*  --  84  BUN <5*  --  6*  CREATININE 0.41*  --  0.55  CALCIUM 9.1  --  9.0  MG  --  2.2  --    Liver Function Tests: Recent Labs    08/24/20 2132  AST 17  ALT 9  ALKPHOS 76  BILITOT 1.2  PROT 9.0*  ALBUMIN 4.1   Recent Labs    08/24/20 2132  LIPASE 59*   CBC: Recent Labs    08/24/20 2132 08/26/20 0504  WBC 5.6 5.1  HGB 11.3* 11.5*  HCT 33.8* 33.3*  MCV 78.1* 76.6*  PLT 302 351   Cardiac Enzymes: No results for input(s): CKTOTAL, CKMB, CKMBINDEX, TROPONINI in the last 72 hours. BNP: Invalid input(s): POCBNP D-Dimer: No results for input(s): DDIMER in the last 72 hours. Hemoglobin A1C: No results for input(s): HGBA1C in the last 72 hours. Fasting Lipid Panel: No results for input(s): CHOL, HDL, LDLCALC, TRIG, CHOLHDL, LDLDIRECT in the last 72 hours. Thyroid Function Tests: No results for input(s): TSH, T4TOTAL, T3FREE, THYROIDAB in the last 72 hours.  Invalid input(s): FREET3 Anemia Panel: No results for input(s): VITAMINB12, FOLATE, FERRITIN, TIBC, IRON, RETICCTPCT in the last 72 hours.   PHYSICAL EXAM General: Well developed, well nourished, in no acute distress HEENT:  Normocephalic and  atramatic Neck:  No JVD.  Lungs: Clear bilaterally to auscultation and percussion. Heart: HRRR . Normal S1 and S2. IV/VI systolic murmurs.  Abdomen: Bowel sounds are positive, abdomen soft and non-tender  Msk:  Back normal, normal gait. Normal strength and tone for age. Extremities: No clubbing, cyanosis or edema.   Neuro: Alert and oriented X 3. Psych:  Good affect, responds appropriately  TELEMETRY: NSR 80/bpm  ASSESSMENT AND PLAN: Patient presenting to the emergency department with acute altered mental status. AMS remains resolved. MRI negative. Hypertensive emergency resolved. Planned esophageal dilation tomorrow AM. Awaiting echocardiogram. We will continue to follow.  Active Problems:   Dysphagia   Encephalopathy   Electrolyte abnormality    Adaline Sill, NP-C 08/26/2020 11:44 AM

## 2020-08-26 NOTE — Anesthesia Preprocedure Evaluation (Signed)
Anesthesia Evaluation  Patient identified by MRN, date of birth, ID band Patient awake    Reviewed: Allergy & Precautions, NPO status , Patient's Chart, lab work & pertinent test results  History of Anesthesia Complications Negative for: history of anesthetic complications  Airway Mallampati: II  TM Distance: >3 FB Neck ROM: Full    Dental  (+) Edentulous Lower, Edentulous Upper   Pulmonary neg sleep apnea, neg COPD, Current Smoker and Patient abstained from smoking.,    Pulmonary exam normal breath sounds clear to auscultation       Cardiovascular Exercise Tolerance: Good METShypertension, (-) CAD and (-) Past MI (-) dysrhythmias  Rhythm:Regular Rate:Normal - Systolic murmurs Echo 0/86/7619:  Borderline left ventricular systolic function, ejection fraction 50%  Aortic sclerosis  Dilated right ventricle - mild  Normal right ventricular systolic function  Dilated right atrium - mild  Tricuspid regurgitation - mild  Mildly elevated right atrial pressure    Neuro/Psych negative neurological ROS  negative psych ROS   GI/Hepatic PUD, neg GERD  ,(+)     substance abuse  alcohol use, S/p abdominal surgeries for perforate gastric ulcer, esophageal stenosis   Endo/Other  diabetes  Renal/GU negative Renal ROS     Musculoskeletal  (+) Arthritis , Osteoarthritis,    Abdominal   Peds  Hematology  (+) anemia ,   Anesthesia Other Findings Past Medical History: No date: Arthritis No date: Diabetes mellitus without complication (HCC) No date: Gout No date: Hypertension  Reproductive/Obstetrics                             Anesthesia Physical  Anesthesia Plan  ASA: III  Anesthesia Plan: General   Post-op Pain Management:    Induction: Intravenous  PONV Risk Score and Plan: 3 and Ondansetron, Propofol infusion and TIVA  Airway Management Planned: Nasal Cannula  Additional  Equipment: None  Intra-op Plan:   Post-operative Plan:   Informed Consent: I have reviewed the patients History and Physical, chart, labs and discussed the procedure including the risks, benefits and alternatives for the proposed anesthesia with the patient or authorized representative who has indicated his/her understanding and acceptance.     Dental advisory given  Plan Discussed with: CRNA and Surgeon  Anesthesia Plan Comments: (Discussed risks of anesthesia with patient, including possibility of difficulty with spontaneous ventilation under anesthesia necessitating airway intervention, PONV, and rare risks such as cardiac or respiratory or neurological events. Patient understands. Denies nausea vomiting)        Anesthesia Quick Evaluation

## 2020-08-26 NOTE — Progress Notes (Signed)
GI note:   EGD: stricture dilated. Needs repeat dilation in 2 weeks as outpatient.   When discharged needs to go on Prilosec 40 mg BID long term   I will sign off.  Please call me if any further GI concerns or questions.  We would like to thank you for the opportunity to participate in the care of [employer].    Dr Jonathon Bellows MD,MRCP [facility]) Gastroenterology/Hepatology Pager: [phone]

## 2020-08-26 NOTE — Transfer of Care (Signed)
Immediate Anesthesia Transfer of Care Note  Patient: Donna Aguirre  Procedure(s) Performed: ESOPHAGOGASTRODUODENOSCOPY (EGD) (N/A )  Patient Location: PACU  Anesthesia Type:General  Level of Consciousness: sedated  Airway & Oxygen Therapy: Patient Spontanous Breathing  Post-op Assessment: Report given to RN and Post -op Vital signs reviewed and stable  Post vital signs: Reviewed and stable  Last Vitals:  Vitals Value Taken Time  BP 156/70 08/26/20 1415  Temp 36.2 C 08/26/20 1415  Pulse    Resp 25 08/26/20 1415  SpO2 100 % 08/26/20 1415    Last Pain:  Vitals:   08/26/20 1415  TempSrc: Temporal  PainSc:          Complications: No complications documented.

## 2020-08-26 NOTE — Op Note (Signed)
[facility] Gastroenterology Patient Name: Donna Aguirre Procedure Date: 08/26/2020 1:56 PM MRN: [id number] Account #: [id number] Date of Birth: May 18, 1956 Admit Type: Inpatient Age: 65 Room: [facility] ENDO ROOM 4 Gender: Female Note Status: Finalized Procedure:             Upper GI endoscopy Indications:           Dysphagia Providers:             Jonathon Bellows MD, MD Referring MD:          Bridget Hartshorn j. [facility], dr (Referring MD) Medicines:             Monitored Anesthesia Care Complications:         No immediate complications. Procedure:             Pre-Anesthesia Assessment:                        - Prior to the procedure, a History and Physical was                         performed, and patient medications, allergies and                         sensitivities were reviewed. The patient's tolerance                         of previous anesthesia was reviewed.                        - The risks and benefits of the procedure and the                         sedation options and risks were discussed with the                         patient. All questions were answered and informed                         consent was obtained.                        - ASA Grade Assessment: III - A patient with severe                         systemic disease.                        After obtaining informed consent, the endoscope was                         passed under direct vision. Throughout the procedure,                         the patient's blood pressure, pulse, and oxygen                         saturations were monitored continuously. The Endoscope                         was introduced through the mouth,  and advanced to the                         third part of duodenum. The upper GI endoscopy was                         accomplished with ease. The patient tolerated the                         procedure well. Findings:      A non-bleeding diverticulum with a small opening  and no stigmata of       recent bleeding was found in the upper third of the esophagus.      One benign-appearing, intrinsic severe (stenosis; an endoscope cannot       pass) stenosis was found at the gastroesophageal junction. This stenosis       measured 9 mm (inner diameter) x 1 cm (in length). The stenosis was       traversed after dilation. A TTS dilator was passed through the scope.       Dilation with an 01-05-09 mm balloon and a 03-10-11 mm balloon dilator was       performed to 11 mm. The dilation site was examined and showed moderate       mucosal disruption. Estimated blood loss was minimal.      The examined duodenum was normal.      The stomach was normal.      The cardia and gastric fundus were normal on retroflexion. 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                        - Benign-appearing esophageal stenosis. Dilated.                        - Normal examined duodenum.                        - Normal stomach.                        - No specimens collected. Recommendation:        - Return patient to hospital ward for ongoing care.                        - Mechanical soft diet for 2 weeks.                        - Repeat upper endoscopy in 2 weeks for retreatment.                        - F/u with DR Marius Ditch in clinic Procedure Code(s):     --- Professional ---                        [phone], Esophagogastroduodenoscopy, flexible,                         transoral; with transendoscopic balloon dilation of  esophagus (less than 30 mm diameter) Diagnosis Code(s):     --- Professional ---                        Q39.6, Congenital diverticulum of esophagus                        K22.2, Esophageal obstruction                        R13.10, Dysphagia, unspecified CPT copyright 2019 [employer]. All rights reserved. The codes documented in this report are preliminary and upon coder review may  be revised  to meet current compliance requirements. Jonathon Bellows, MD Jonathon Bellows MD, MD 08/26/2020 2:16:02 PM This report has been signed electronically. Number of Addenda: 0 Note Initiated On: 08/26/2020 1:56 PM Estimated Blood Loss:  Estimated blood loss: none.      [facility]

## 2020-08-27 ENCOUNTER — Encounter: Payer: Self-pay | Admitting: Gastroenterology

## 2020-08-27 DIAGNOSIS — I160 Hypertensive urgency: Secondary | ICD-10-CM

## 2020-08-27 LAB — BASIC METABOLIC PANEL
Anion gap: 8 (ref 5–15)
BUN: 6 mg/dL — ABNORMAL LOW (ref 8–23)
CO2: 23 mmol/L (ref 22–32)
Calcium: 8.6 mg/dL — ABNORMAL LOW (ref 8.9–10.3)
Chloride: 102 mmol/L (ref 98–111)
Creatinine, Ser: 0.42 mg/dL — ABNORMAL LOW (ref 0.44–1.00)
GFR, Estimated: >60 mL/min (ref >60)
Glucose, Bld: 70 mg/dL (ref 70–99)
Potassium: 3.4 mmol/L — ABNORMAL LOW (ref 3.5–5.1)
Sodium: 133 mmol/L — ABNORMAL LOW (ref 135–145)

## 2020-08-27 LAB — ECHOCARDIOGRAM COMPLETE
Area-P 1/2: 4.89 cm2
S' Lateral: 2.53 cm

## 2020-08-27 MED ORDER — CYANOCOBALAMIN 1000 MCG PO TABS: 1000.0000 ug | ORAL_TABLET | Freq: Every day | ORAL | 0 refills | Status: DC

## 2020-08-27 MED ORDER — AMLODIPINE BESYLATE 5 MG PO TABS: 5.0000 mg | ORAL_TABLET | Freq: Every day | ORAL | 0 refills | Status: DC

## 2020-08-27 MED ORDER — THIAMINE HCL 100 MG PO TABS: 100.0000 mg | ORAL_TABLET | Freq: Every day | ORAL | 0 refills | Status: DC

## 2020-08-27 MED ORDER — METOPROLOL TARTRATE 25 MG PO TABS: 25.0000 mg | ORAL_TABLET | Freq: Two times a day (BID) | ORAL | Status: DC

## 2020-08-27 MED ORDER — METOPROLOL TARTRATE 25 MG PO TABS
25.0000 mg | ORAL_TABLET | Freq: Two times a day (BID) | ORAL | 0 refills | Status: DC
Start: 2020-08-27 — End: 2020-10-24

## 2020-08-27 MED ORDER — AMLODIPINE BESYLATE 5 MG PO TABS
5.0000 mg | ORAL_TABLET | Freq: Every day | ORAL | Status: DC
  Administered 2020-08-27: 5 mg via ORAL
  Filled 2020-08-27: qty 1

## 2020-08-27 NOTE — Discharge Summary (Signed)
Physician Discharge Summary  Donna Aguirre HBZ:[id number] DOB: 02/24/1956 DOA: 08/25/2020  PCP: [facility] date: 08/25/2020 Discharge date: 08/27/2020  Admitted From: Home Disposition: Home  Recommendations for Outpatient Follow-up:  1. Follow up with PCP in 1 week 2. Follow up with GI in 2 weeks for repeat esophageal dilation 3. Follow up with cardiology 4. Please follow up on the following pending results: None  Home Health: None Equipment/Devices: None  Discharge Condition: Stable CODE STATUS: Full code Diet recommendation: Mechanical soft (dysphagia) diet for 2 weeks   Brief/Interim Summary:  Admission HPI written by Christel Mormon, MD   HISTORY OF PRESENT ILLNESS: Donna Aguirre is a 65 y.o. African-American female with medical history significant for hypertension, hyponatremia, gout, type 2 diabetes mellitus and osteoarthritis, who presented to the emergency room with acute onset of altered mental status.  Per her daughter who called EMS today as the patient has been having confusion over the last couple of days.  She stated that she was having slurred speech and has been sleepy.  2 days ago she went to a funeral and was doing well been however after that she has been spacing out per her daughter.  She was positive for COVID-19 on 07/19/2020.  She has been having nasal congestion,  sore throat and postnasal drip as well as diarrhea.  The patient stated that she was having nausea and vomiting however her daughter did not endorse that.  No reported chest pain or palpitations.  No worsening cough or wheezing  Hospital course:  Hypertensive urgency Possible hypertensive emergency if contributed to encephalopathy.  Associated elevated troponin.  No associated chest pain.  Patient is on lisinopril, metoprolol succinate as an outpatient which were continued.  Blood pressure better controlled on this regimen. Cardiology consulted and antihypertensives  adjusted. Transthoracic Echocardiogram obtained and was significant for grade 2 diastolic heart dysfunction. Patient will need continued titration as an outpatient.  Acute encephalopathy Unsure of specific etiology, unfortunately.  Patient was significantly hypertensive on admission.  Possibly multifactorial.  MRI brain was negative for acute stroke.  Neurology consult on admission with recommendation for high-dose thiamine therapy and vitamin B12 therapy.  They also recommended checking HIV (nonreactive), RPR (nonreactive) labs. Encephalopathy resolved even prior to initiation of thiamine. Patient received 2 full days of high dose thiamine and transitioned to thiamine 100 mg daily. Continue Vitamin B12 1000 mcg daily. Neurology recommending possible neurocognitive evaluation as an outpatient if patient continues to have issues.  Dysphagia Patient was evaluated by speech therapy who recommended clear liquid diet.  Barium swallow was obtained which was significant for upper esophageal stricture.  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  Patient now advance to soft diet with recommendation to continue for two weeks.  Patient will need outpatient GI follow-up in two weeks.  Hyponatremia Mild.  Unlikely that this was contributing to mental status issues.  Improving with IV fluid resuscitation.  Elevated lipase Very mild.  No associated symptoms consistent with pancreatitis.  GERD Continue Prilosec  History of alcohol abuse Patient states she does not drink much now. No history of alcohol withdrawal per patient. CIWA not started on admission.  Discharge Diagnoses:  Active Problems:   HTN (hypertension)   Dysphagia   Esophageal stricture   Encephalopathy   Electrolyte abnormality    Discharge Instructions   Allergies  as of 08/27/2020   No  Known Allergies     Medication List    STOP taking these medications   metoprolol succinate 25 MG 24 hr tablet Commonly known as: TOPROL-XL     TAKE these medications   amLODipine 5 MG tablet Commonly known as: NORVASC Take 1 tablet (5 mg total) by mouth daily.   cyanocobalamin 1000 MCG tablet Take 1 tablet (1,000 mcg total) by mouth daily. Start taking on: August 28, 2020   lisinopril 20 MG tablet Commonly known as: ZESTRIL Take 1 tablet (20 mg total) by mouth daily.   metoprolol tartrate 25 MG tablet Commonly known as: LOPRESSOR Take 1 tablet (25 mg total) by mouth 2 (two) times daily.   omeprazole 40 MG capsule Commonly known as: PRILOSEC Take 1 capsule (40 mg total) by mouth 2 (two) times daily before a meal.   thiamine 100 MG tablet Take 1 tablet (100 mg total) by mouth daily.       No Known Allergies  Consultations:  Gastroenterology  Cardiology  Neurology   Procedures/Studies: CT Head Wo Contrast  Result Date: 08/25/2020 CLINICAL DATA:  Altered mental status EXAM: CT HEAD WITHOUT CONTRAST TECHNIQUE: Contiguous axial images were obtained from the base of the skull through the vertex without intravenous contrast. COMPARISON:  None. FINDINGS: Brain: Normal anatomic configuration. No abnormal intra or extra-axial mass lesion or fluid collection. No abnormal mass effect or midline shift. No evidence of acute intracranial hemorrhage or infarct. Ventricular size is normal. Cerebellum unremarkable. Vascular: Moderate atherosclerotic calcification within the carotid siphons. No asymmetric hyperdense vasculature at the skull base. Skull: Intact Sinuses/Orbits: Paranasal sinuses are clear. Orbits are unremarkable. Other: Mastoid air cells and middle ear cavities are clear. IMPRESSION: No acute intracranial abnormality. Electronically Signed   By: Fidela Salisbury MD   On: 08/25/2020 03:27   CT Angio Chest PE W and/or Wo Contrast  Result Date: 08/25/2020 CLINICAL DATA:   Nausea and vomiting with abdominal and chest pain, history of prior COVID-19 infection EXAM: CT ANGIOGRAPHY CHEST CT ABDOMEN AND PELVIS WITH CONTRAST TECHNIQUE: Multidetector CT imaging of the chest was performed using the standard protocol during bolus administration of intravenous contrast. Multiplanar CT image reconstructions and MIPs were obtained to evaluate the vascular anatomy. Multidetector CT imaging of the abdomen and pelvis was performed using the standard protocol during bolus administration of intravenous contrast. CONTRAST:  148mL OMNIPAQUE IOHEXOL 350 MG/ML SOLN COMPARISON:  07/19/2020 FINDINGS: CTA CHEST FINDINGS Cardiovascular: Heart is at the upper limits of normal in size. Aortic calcifications are noted. Coronary calcifications are seen as well. No dissection or aneurysmal dilatation is noted. The pulmonary artery shows a normal branching pattern bilaterally. No filling defects to suggest pulmonary emboli are noted. Mediastinum/Nodes: Thoracic inlet is within normal limits. No hilar or mediastinal adenopathy is noted. The esophagus is significantly dilated with air and ingested food stuffs. This may also be related to reflux. No significant wall thickening is noted. Lungs/Pleura: The lungs are well aerated bilaterally. Some patchy ground-glass airspace opacities are noted primarily in the right upper lobe likely related to the prior COVID-19 infection. No focal infiltrate or effusion is seen. No sizable parenchymal nodule is noted. Musculoskeletal: Degenerative changes of the thoracic spine are noted. Healed sternal fracture is noted. Review of the MIP images confirms the above findings. CT ABDOMEN and PELVIS FINDINGS Hepatobiliary: No focal liver abnormality is seen. No gallstones, gallbladder wall thickening, or biliary dilatation. Pancreas: Unremarkable. No pancreatic ductal dilatation or surrounding inflammatory changes.  Spleen: Normal in size without focal abnormality. Adrenals/Urinary  Tract: Adrenal glands are within normal limits. Kidneys demonstrate a normal enhancement pattern. Normal excretion is noted bilaterally. No obstructive changes are noted. Bladder is decompressed. Stomach/Bowel: No obstructive or inflammatory changes of the colon are seen. The appendix is not well visualized. No inflammatory changes to suggest appendicitis are noted. Small bowel and stomach appear within normal limits. Vascular/Lymphatic: Aortic atherosclerosis. No enlarged abdominal or pelvic lymph nodes. Reproductive: Uterus and bilateral adnexa are unremarkable. Other: No abdominal wall hernia or abnormality. No abdominopelvic ascites. Musculoskeletal: Degenerative changes of the lumbar spine Review of the MIP images confirms the above findings. IMPRESSION: CTA of the chest: No filling defects to suggest pulmonary emboli are identified. Patchy ground-glass opacities in the right upper lobe consistent with the prior COVID-19 infection. Significant dilatation of the esophagus with air and ingested food stuffs. This may be related to reflux but is of uncertain chronicity. CT of the abdomen and pelvis: No acute abnormality in the abdomen pelvis is noted. Electronically Signed   By: Inez Catalina M.D.   On: 08/25/2020 00:26   MR BRAIN WO CONTRAST  Result Date: 08/25/2020 CLINICAL DATA:  Neuro deficit, acute, stroke suspected. Altered mental status. Confusion. Elevated troponin. EXAM: MRI HEAD WITHOUT CONTRAST TECHNIQUE: Multiplanar, multiecho pulse sequences of the brain and surrounding structures were obtained without intravenous contrast. COMPARISON:  CT head without contrast 08/25/2020 FINDINGS: Brain: No acute infarct, hemorrhage, or mass lesion is present. Moderate generalized atrophy is most prominent in the parietal lobes bilaterally. Periventricular T2 hyperintensities are mildly advanced for age. Focal T2 hyperintensity is present in the right corona radiata. Scattered subcortical T2 hyperintensities are  mildly advanced for age is well. No acute infarct, hemorrhage, or mass lesion is present. Basal ganglia are intact. The internal auditory canals are within normal limits. The brainstem and cerebellum are within normal limits. Vascular: Flow is present in the major intracranial arteries. Skull and upper cervical spine: The craniocervical junction is normal. Upper cervical spine is within normal limits. Marrow signal is unremarkable. Sinuses/Orbits: The paranasal sinuses and mastoid air cells are clear. The globes and orbits are within normal limits. IMPRESSION: 1. No acute intracranial abnormality. 2. Moderate generalized atrophy and white matter disease is mildly advanced for age. This likely reflects the sequela of chronic microvascular ischemia. Electronically Signed   By: San Morelle M.D.   On: 08/25/2020 12:03   CT ABDOMEN PELVIS W CONTRAST  Result Date: 08/25/2020 CLINICAL DATA:  Nausea and vomiting with abdominal and chest pain, history of prior COVID-19 infection EXAM: CT ANGIOGRAPHY CHEST CT ABDOMEN AND PELVIS WITH CONTRAST TECHNIQUE: Multidetector CT imaging of the chest was performed using the standard protocol during bolus administration of intravenous contrast. Multiplanar CT image reconstructions and MIPs were obtained to evaluate the vascular anatomy. Multidetector CT imaging of the abdomen and pelvis was performed using the standard protocol during bolus administration of intravenous contrast. CONTRAST:  150mL OMNIPAQUE IOHEXOL 350 MG/ML SOLN COMPARISON:  07/19/2020 FINDINGS: CTA CHEST FINDINGS Cardiovascular: Heart is at the upper limits of normal in size. Aortic calcifications are noted. Coronary calcifications are seen as well. No dissection or aneurysmal dilatation is noted. The pulmonary artery shows a normal branching pattern bilaterally. No filling defects to suggest pulmonary emboli are noted. Mediastinum/Nodes: Thoracic inlet is within normal limits. No hilar or mediastinal  adenopathy is noted. The esophagus is significantly dilated with air and ingested food stuffs. This may also be related to reflux. No significant wall thickening  is noted. Lungs/Pleura: The lungs are well aerated bilaterally. Some patchy ground-glass airspace opacities are noted primarily in the right upper lobe likely related to the prior COVID-19 infection. No focal infiltrate or effusion is seen. No sizable parenchymal nodule is noted. Musculoskeletal: Degenerative changes of the thoracic spine are noted. Healed sternal fracture is noted. Review of the MIP images confirms the above findings. CT ABDOMEN and PELVIS FINDINGS Hepatobiliary: No focal liver abnormality is seen. No gallstones, gallbladder wall thickening, or biliary dilatation. Pancreas: Unremarkable. No pancreatic ductal dilatation or surrounding inflammatory changes. Spleen: Normal in size without focal abnormality. Adrenals/Urinary Tract: Adrenal glands are within normal limits. Kidneys demonstrate a normal enhancement pattern. Normal excretion is noted bilaterally. No obstructive changes are noted. Bladder is decompressed. Stomach/Bowel: No obstructive or inflammatory changes of the colon are seen. The appendix is not well visualized. No inflammatory changes to suggest appendicitis are noted. Small bowel and stomach appear within normal limits. Vascular/Lymphatic: Aortic atherosclerosis. No enlarged abdominal or pelvic lymph nodes. Reproductive: Uterus and bilateral adnexa are unremarkable. Other: No abdominal wall hernia or abnormality. No abdominopelvic ascites. Musculoskeletal: Degenerative changes of the lumbar spine Review of the MIP images confirms the above findings. IMPRESSION: CTA of the chest: No filling defects to suggest pulmonary emboli are identified. Patchy ground-glass opacities in the right upper lobe consistent with the prior COVID-19 infection. Significant dilatation of the esophagus with air and ingested food stuffs. This may be  related to reflux but is of uncertain chronicity. CT of the abdomen and pelvis: No acute abnormality in the abdomen pelvis is noted. Electronically Signed   By: Inez Catalina M.D.   On: 08/25/2020 00:26   ECHOCARDIOGRAM COMPLETE  Result Date: 08/27/2020    ECHOCARDIOGRAM REPORT   Patient Name:   Donna Aguirre Date of Exam: 08/26/2020 Medical Rec #:  [id number]       Height:       64.0 in Accession #:    [id number]      Weight:       146.0 lb Date of Birth:  Jun 27, 1955        BSA:          70.711 m Patient Age:    65 years        BP:           182/94 mmHg Patient Gender: F               HR:           85 bpm. Exam Location:  [facility] Procedure: 2D Echo, Cardiac Doppler and Color Doppler Indications:     R01.1 Murmur  History:         Patient has no prior history of Echocardiogram examinations.                  Risk Factors:Hypertension and Diabetes.  Sonographer:     Wilford Sports Rodgers-Jones Referring Phys:  [id number] [place] Diagnosing Phys: Neoma Laming MD IMPRESSIONS  1. Left ventricular ejection fraction, by estimation, is 60 to 65%. The left ventricle has normal function. The left ventricle has no regional wall motion abnormalities. There is mild left ventricular hypertrophy. Left ventricular diastolic parameters are consistent with Grade II diastolic dysfunction (pseudonormalization).  2. Right ventricular systolic function is normal. The right ventricular size is normal.  3. The mitral valve is normal in structure. Trivial mitral valve regurgitation. No evidence of mitral stenosis.  4. The aortic valve is normal in structure. Aortic valve regurgitation  is not visualized. No aortic stenosis is present.  5. The inferior vena cava is normal in size with greater than 50% respiratory variability, suggesting right atrial pressure of 3 mmHg. FINDINGS  Left Ventricle: Left ventricular ejection fraction, by estimation, is 60 to 65%. The left ventricle has normal function. The left ventricle has no regional wall motion  abnormalities. The left ventricular internal cavity size was normal in size. There is  mild left ventricular hypertrophy. Left ventricular diastolic parameters are consistent with Grade II diastolic dysfunction (pseudonormalization). Right Ventricle: The right ventricular size is normal. No increase in right ventricular wall thickness. Right ventricular systolic function is normal. Left [facility]: Left atrial size was normal in size. Right [facility]: Right atrial size was normal in size. Pericardium: There is no evidence of pericardial effusion. Mitral Valve: The mitral valve is normal in structure. Trivial mitral valve regurgitation. No evidence of mitral valve stenosis. Tricuspid Valve: The tricuspid valve is normal in structure. Tricuspid valve regurgitation is trivial. No evidence of tricuspid stenosis. Aortic Valve: The aortic valve is normal in structure. Aortic valve regurgitation is not visualized. No aortic stenosis is present. Pulmonic Valve: The pulmonic valve was normal in structure. Pulmonic valve regurgitation is not visualized. No evidence of pulmonic stenosis. Aorta: The aortic root is normal in size and structure. Venous: The inferior vena cava is normal in size with greater than 50% respiratory variability, suggesting right atrial pressure of 3 mmHg. IAS/Shunts: No atrial level shunt detected by color flow Doppler.  LEFT VENTRICLE PLAX 2D LVIDd:         4.07 cm  Diastology LVIDs:         2.53 cm  LV e' medial:    7.72 cm/s LV PW:         1.07 cm  LV E/e' medial:  13.2 LV IVS:        1.09 cm  LV e' lateral:   12.40 cm/s LVOT diam:     2.10 cm  LV E/e' lateral: 8.2 LV SV:         81 LV SV Index:   48 LVOT Area:     3.46 cm  RIGHT VENTRICLE             IVC RV Basal diam:  3.01 cm     IVC diam: 1.96 cm RV S prime:     17.50 cm/s TAPSE (M-mode): 2.5 cm LEFT ATRIUM           Index       RIGHT ATRIUM           Index LA diam:      3.90 cm 2.28 cm/m  RA Area:     11.80 cm LA Vol (A2C): 34.3 ml 20.04 ml/m RA  Volume:   26.70 ml  15.60 ml/m LA Vol (A4C): 51.9 ml 30.33 ml/m  AORTIC VALVE LVOT Vmax:   104.00 cm/s LVOT Vmean:  70.600 cm/s LVOT VTI:    0.235 m  AORTA Ao Root diam: 2.90 cm MITRAL VALVE MV Area (PHT): 4.89 cm     SHUNTS MV Decel Time: 155 msec     Systemic VTI:  0.24 m MV E velocity: 102.00 cm/s  Systemic Diam: 2.10 cm MV A velocity: 72.70 cm/s MV E/A ratio:  1.40 Neoma Laming MD Electronically signed by Neoma Laming MD Signature Date/Time: 08/27/2020/10:38:51 AM    Final    DG ESOPHAGUS W SINGLE CM (SOL OR THIN BA)  Result Date: 08/25/2020 CLINICAL DATA:  Dysphagia.  History of esophagitis and esophageal stricture. EXAM: ESOPHOGRAM/BARIUM SWALLOW TECHNIQUE: Single contrast examination was performed using  thin barium. FLUOROSCOPY TIME:  Fluoroscopy Time:  4 seconds Radiation Exposure Index (if provided by the fluoroscopic device): 15.8 mGy COMPARISON:  CT 08/25/2020. FINDINGS: Limited exam due to the patient's clinical condition. The cervical esophagus is grossly unremarkable. Upper thoracic esophagus is again noted to be dilated and tapers at the level of the upper thoracic esophagus at which level there is irregular narrowing. Esophageal malignancy cannot be excluded. Barium empties into the stomach. No reflux was demonstrated. IMPRESSION: Limited exam due to patient's clinical condition. Irregular narrowing of the upper thoracic esophagus is noted. Esophageal malignancy cannot be excluded. Associated proximal esophageal distention is again noted. Electronically Signed   By: Marcello Moores  Register   On: 08/25/2020 12:23      Subjective: No concerns today. No chest pain or dyspnea.  Discharge Exam: Vitals:   08/27/20 1043 08/27/20 1122  BP: (!) 144/64 (!) 166/73  Pulse: 95 97  Resp: 18 16  Temp: 98.2 F (36.8 C) 98.3 F (36.8 C)  SpO2: 100% 100%   Vitals:   08/27/20 0423 08/27/20 0840 08/27/20 1043 08/27/20 1122  BP: (!) 148/68 (!) 173/75 (!) 144/64 (!) 166/73  Pulse: 78 87 95 97  Resp:  16 18 18 16   Temp: 98.2 F (36.8 C)  98.2 F (36.8 C) 98.3 F (36.8 C)  TempSrc: Oral  Oral Oral  SpO2: 100% 100% 100% 100%  Weight:      Height:        General: Pt is alert, awake, not in acute distress Cardiovascular: RRR, R5/J8 +. 2/6 systolic murmur Respiratory: CTA bilaterally, no wheezing, no rhonchi Abdominal: Soft, NT, ND, bowel sounds + Extremities: no edema, no cyanosis    The results of significant diagnostics from this hospitalization (including imaging, microbiology, ancillary and laboratory) are listed below for reference.     Microbiology: Recent Results (from the past 240 hour(s))  Resp Panel by RT-PCR (Flu A&B, Covid) Nasopharyngeal Swab     Status: None   Collection Time: 08/25/20 10:48 AM   Specimen: Nasopharyngeal Swab; Nasopharyngeal(NP) swabs in vial transport medium  Result Value Ref Range Status   SARS Coronavirus 2 by RT PCR NEGATIVE NEGATIVE Final    Comment: (NOTE) SARS-CoV-2 target nucleic acids are NOT DETECTED.  The SARS-CoV-2 RNA is generally detectable in upper respiratory specimens during the acute phase of infection. The lowest concentration of SARS-CoV-2 viral copies this assay can detect is 138 copies/mL. A negative result does not preclude SARS-Cov-2 infection and should not be used as the sole basis for treatment or other patient management decisions. A negative result may occur with  improper specimen collection/handling, submission of specimen other than nasopharyngeal swab, presence of viral mutation(s) within the areas targeted by this assay, and inadequate number of viral copies(<138 copies/mL). A negative result must be combined with clinical observations, patient history, and epidemiological information. The expected result is Negative.  Fact Sheet for Patients:  EntrepreneurPulse.com.au  Fact Sheet for Healthcare Providers:  IncredibleEmployment.be  This test is no t yet approved or  cleared by the [place] FDA and  has been authorized for detection and/or diagnosis of SARS-CoV-2 by FDA under an Emergency Use Authorization (EUA). This EUA will remain  in effect (meaning this test can be used) for the duration of the COVID-19 declaration under Section 564(b)(1) of the Act, 21 U.S.C.section 360bbb-3(b)(1), unless the authorization is terminated  or revoked sooner.  Influenza A by PCR NEGATIVE NEGATIVE Final   Influenza B by PCR NEGATIVE NEGATIVE Final    Comment: (NOTE) The Xpert Xpress SARS-CoV-2/FLU/RSV plus assay is intended as an aid in the diagnosis of influenza from Nasopharyngeal swab specimens and should not be used as a sole basis for treatment. Nasal washings and aspirates are unacceptable for Xpert Xpress SARS-CoV-2/FLU/RSV testing.  Fact Sheet for Patients: EntrepreneurPulse.com.au  Fact Sheet for Healthcare Providers: IncredibleEmployment.be  This test is not yet approved or cleared by the [place] FDA and has been authorized for detection and/or diagnosis of SARS-CoV-2 by FDA under an Emergency Use Authorization (EUA). This EUA will remain in effect (meaning this test can be used) for the duration of the COVID-19 declaration under Section 564(b)(1) of the Act, 21 U.S.C. section 360bbb-3(b)(1), unless the authorization is terminated or revoked.  Performed at [facility], [address] [place], [place] [address]      Labs: BNP (last 3 results) No results for input(s): BNP in the last 8760 hours. Basic Metabolic Panel: Recent Labs  Lab 08/24/20 2132 08/25/20 0053 08/26/20 0504 08/27/20 0447  NA 128*  --  130* 133*  K 3.0*  --  3.2* 3.4*  CL 92*  --  98 102  CO2 26  --  22 23  GLUCOSE 117*  --  84 70  BUN <5*  --  6* 6*  CREATININE 0.41*  --  0.55 0.42*  CALCIUM 9.1  --  9.0 8.6*  MG  --  2.2  --   --    Liver Function Tests: Recent Labs  Lab 08/24/20 2132  AST 17   ALT 9  ALKPHOS 76  BILITOT 1.2  PROT 9.0*  ALBUMIN 4.1   Recent Labs  Lab 08/24/20 2132  LIPASE 59*   Recent Labs  Lab 08/25/20 0241  AMMONIA 24   CBC: Recent Labs  Lab 08/24/20 2132 08/26/20 0504  WBC 5.6 5.1  HGB 11.3* 11.5*  HCT 33.8* 33.3*  MCV 78.1* 76.6*  PLT 302 351   Cardiac Enzymes: No results for input(s): CKTOTAL, CKMB, CKMBINDEX, TROPONINI in the last 168 hours. BNP: Invalid input(s): POCBNP CBG: Recent Labs  Lab 08/25/20 0500  GLUCAP 100*   D-Dimer No results for input(s): DDIMER in the last 72 hours. Hgb A1c No results for input(s): HGBA1C in the last 72 hours. Lipid Profile No results for input(s): CHOL, HDL, LDLCALC, TRIG, CHOLHDL, LDLDIRECT in the last 72 hours. Thyroid function studies No results for input(s): TSH, T4TOTAL, T3FREE, THYROIDAB in the last 72 hours.  Invalid input(s): FREET3 Anemia work up No results for input(s): VITAMINB12, FOLATE, FERRITIN, TIBC, IRON, RETICCTPCT in the last 72 hours. Urinalysis    Component Value Date/Time   COLORURINE STRAW (A) 08/24/2020 2354   APPEARANCEUR CLEAR (A) 08/24/2020 2354   LABSPEC 1.009 08/24/2020 2354   PHURINE 8.0 08/24/2020 2354   GLUCOSEU NEGATIVE 08/24/2020 2354   HGBUR SMALL (A) 08/24/2020 2354   BILIRUBINUR NEGATIVE 08/24/2020 2354   KETONESUR 20 (A) 08/24/2020 2354   PROTEINUR NEGATIVE 08/24/2020 2354   NITRITE NEGATIVE 08/24/2020 2354   LEUKOCYTESUR NEGATIVE 08/24/2020 2354   Sepsis Labs Invalid input(s): PROCALCITONIN,  WBC,  LACTICIDVEN Microbiology Recent Results (from the past 240 hour(s))  Resp Panel by RT-PCR (Flu A&B, Covid) Nasopharyngeal Swab     Status: None   Collection Time: 08/25/20 10:48 AM   Specimen: Nasopharyngeal Swab; Nasopharyngeal(NP) swabs in vial transport medium  Result Value Ref Range Status  SARS Coronavirus 2 by RT PCR NEGATIVE NEGATIVE Final    Comment: (NOTE) SARS-CoV-2 target nucleic acids are NOT DETECTED.  The SARS-CoV-2 RNA is  generally detectable in upper respiratory specimens during the acute phase of infection. The lowest concentration of SARS-CoV-2 viral copies this assay can detect is 138 copies/mL. A negative result does not preclude SARS-Cov-2 infection and should not be used as the sole basis for treatment or other patient management decisions. A negative result may occur with  improper specimen collection/handling, submission of specimen other than nasopharyngeal swab, presence of viral mutation(s) within the areas targeted by this assay, and inadequate number of viral copies(<138 copies/mL). A negative result must be combined with clinical observations, patient history, and epidemiological information. The expected result is Negative.  Fact Sheet for Patients:  EntrepreneurPulse.com.au  Fact Sheet for Healthcare Providers:  IncredibleEmployment.be  This test is no t yet approved or cleared by the [place] FDA and  has been authorized for detection and/or diagnosis of SARS-CoV-2 by FDA under an Emergency Use Authorization (EUA). This EUA will remain  in effect (meaning this test can be used) for the duration of the COVID-19 declaration under Section 564(b)(1) of the Act, 21 U.S.C.section 360bbb-3(b)(1), unless the authorization is terminated  or revoked sooner.       Influenza A by PCR NEGATIVE NEGATIVE Final   Influenza B by PCR NEGATIVE NEGATIVE Final    Comment: (NOTE) The Xpert Xpress SARS-CoV-2/FLU/RSV plus assay is intended as an aid in the diagnosis of influenza from Nasopharyngeal swab specimens and should not be used as a sole basis for treatment. Nasal washings and aspirates are unacceptable for Xpert Xpress SARS-CoV-2/FLU/RSV testing.  Fact Sheet for Patients: EntrepreneurPulse.com.au  Fact Sheet for Healthcare Providers: IncredibleEmployment.be  This test is not yet approved or cleared by the [place] FDA and has been authorized for detection and/or diagnosis of SARS-CoV-2 by FDA under an Emergency Use Authorization (EUA). This EUA will remain in effect (meaning this test can be used) for the duration of the COVID-19 declaration under Section 564(b)(1) of the Act, 21 U.S.C. section 360bbb-3(b)(1), unless the authorization is terminated or revoked.  Performed at [facility], [address] [place], [place] [address]      Time coordinating discharge: 35 minutes  SIGNED:   Cordelia Poche, MD [facility] 08/27/2020, 12:23 PM

## 2020-08-27 NOTE — Anesthesia Postprocedure Evaluation (Signed)
Anesthesia Post Note  Patient: Donna Aguirre  Procedure(s) Performed: ESOPHAGOGASTRODUODENOSCOPY (EGD) (N/A )  Patient location during evaluation: Endoscopy Anesthesia Type: General Level of consciousness: awake and alert and oriented Pain management: pain level controlled Vital Signs Assessment: post-procedure vital signs reviewed and stable Respiratory status: spontaneous breathing Cardiovascular status: blood pressure returned to baseline Anesthetic complications: no   No complications documented.   Last Vitals:  Vitals:   08/27/20 0423 08/27/20 0840  BP: (!) 148/68 (!) 173/75  Pulse: 78 87  Resp: 16 18  Temp: 36.8 C   SpO2: 100% 100%    Last Pain:  Vitals:   08/27/20 0423  TempSrc: Oral  PainSc:                  [name]

## 2020-08-27 NOTE — Discharge Instructions (Signed)
Donna Aguirre,  You are in the hospital because of a few different problems.  1. Confusion: It is not clear exactly why he had confusion.  This may be related to vitamin deficiency or even your significantly high blood pressure.  With time, your confusion resolved. 2. High blood pressure: Your blood pressure medications were adjusted and is improved.  The cardiologist was consulted and made some changes to your medication regimen.  Please take your blood pressure medications as prescribed and follow-up with your doctor as an outpatient. 3. Dysphagia: This was secondary to your esophageal stricture.  He had a esophageal dilation with recommendations to continue with diet mechanical soft/dysphagia for the next 2 weeks diet.  You need to follow-up with your GI physician in 2 weeks.

## 2020-08-27 NOTE — Plan of Care (Signed)
  Problem: Clinical Measurements: Goal: Ability to maintain clinical measurements within normal limits will improve Outcome: Progressing Goal: Will remain free from infection Outcome: Progressing Goal: Diagnostic test results will improve Outcome: Progressing Goal: Respiratory complications will improve Outcome: Progressing Goal: Cardiovascular complication will be avoided Outcome: Progressing   Problem: Pain Managment: Goal: General experience of comfort will improve Outcome: Progressing   Pt is alert and oriented. Bp elevated last night, due metoprolol given. Latest bp 148/68, HR-78. No complaints of pain or SOB.

## 2020-08-27 NOTE — Progress Notes (Signed)
SUBJECTIVE: Patient resting in bed. Denies chest pain or dyspnea.    Vitals:   08/26/20 2054 08/26/20 2317 08/27/20 0423 08/27/20 0840  BP: (!) 178/78 (!) 143/55 (!) 148/68 (!) 173/75  Pulse: 90 68 78 87  Resp: 16 18 16 18   Temp:  98 F (36.7 C) 98.2 F (36.8 C)   TempSrc:  Oral Oral   SpO2: 100% 100% 100% 100%  Weight:      Height:        Intake/Output Summary (Last 24 hours) at 08/27/2020 1043 Last data filed at 08/27/2020 1012 Gross per 24 hour  Intake 3180.8 ml  Output 1 ml  Net 3179.8 ml    LABS: Basic Metabolic Panel: Recent Labs    08/25/20 0053 08/26/20 0504 08/27/20 0447  NA  --  130* 133*  K  --  3.2* 3.4*  CL  --  98 102  CO2  --  22 23  GLUCOSE  --  84 70  BUN  --  6* 6*  CREATININE  --  0.55 0.42*  CALCIUM  --  9.0 8.6*  MG 2.2  --   --    Liver Function Tests: Recent Labs    08/24/20 2132  AST 17  ALT 9  ALKPHOS 76  BILITOT 1.2  PROT 9.0*  ALBUMIN 4.1   Recent Labs    08/24/20 2132  LIPASE 59*   CBC: Recent Labs    08/24/20 2132 08/26/20 0504  WBC 5.6 5.1  HGB 11.3* 11.5*  HCT 33.8* 33.3*  MCV 78.1* 76.6*  PLT 302 351   Cardiac Enzymes: No results for input(s): CKTOTAL, CKMB, CKMBINDEX, TROPONINI in the last 72 hours. BNP: Invalid input(s): POCBNP D-Dimer: No results for input(s): DDIMER in the last 72 hours. Hemoglobin A1C: No results for input(s): HGBA1C in the last 72 hours. Fasting Lipid Panel: No results for input(s): CHOL, HDL, LDLCALC, TRIG, CHOLHDL, LDLDIRECT in the last 72 hours. Thyroid Function Tests: No results for input(s): TSH, T4TOTAL, T3FREE, THYROIDAB in the last 72 hours.  Invalid input(s): FREET3 Anemia Panel: No results for input(s): VITAMINB12, FOLATE, FERRITIN, TIBC, IRON, RETICCTPCT in the last 72 hours.   PHYSICAL EXAM General: Well developed, well nourished, in no acute distress HEENT:  Normocephalic and atramatic Neck:  No JVD.  Lungs: Clear bilaterally to auscultation and  percussion. Heart: HRRR . IV/VI systolic murmur.  Abdomen: Bowel sounds are positive, abdomen soft and non-tender  Msk:  Back normal, normal gait. Normal strength and tone for age. Extremities: No clubbing, cyanosis or edema.   Neuro: Alert and oriented X 3. Psych:  Good affect, responds appropriately  TELEMETRY: 86/bpm  ASSESSMENT AND PLAN: Patient presenting to the emergency department with acute altered mental status. AMS remains resolved. With increased HTN, increase metoprolol tartrate to 25mg , add amlodipine 5mg , and continue lisinopril 20mg . Further management can be completed as an outpatient. Echocardiogram with normal LVEF, mild LVH, grade II diastolic dysfunction. From a cardiac point of view patient is stable and we will sign off at this time. Patient can be seen in our clinic next week.  Active Problems:   HTN (hypertension)   Dysphagia   Esophageal stricture   Encephalopathy   Electrolyte abnormality    Adaline Sill, NP-C 08/27/2020 10:43 AM

## 2020-08-27 NOTE — Progress Notes (Signed)
Patient sitting on side of bed dressed, no acute distress noted. Patient and daughter received discharge instructions and able to verbal understanding and teach back. IV site discontinued. Awaiting wheelchair.

## 2020-08-27 NOTE — Progress Notes (Addendum)
Speech Language Pathology Treatment: Dysphagia  Patient Details Name: Donna Aguirre MRN: [id number] DOB: [birth year] Today's Date: 08/27/2020 Time: [id number] SLP Time Calculation (min) (ACUTE ONLY): 40 min  Assessment / Plan / Recommendation Clinical Impression  Pt seen today for ongoing assessment of toleration of diet(Mech Soft diet rec'd by GI post EGD yesterday); and education on general Reflux and esophageal phase dysmotility precautions. 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 Dilation was performed. Pt was recommended to f/u in 2 weeks for repeat Dilation w/ GI.  Pt consumed po trials of mech soft/minced foods w/ no overt, clinical s/s of aspiration noted; similar noted w/ trials of thin liquids via straw. Pt also consumed few tsps of purees w/out difficulty. NSG is Crushing Pills in Puree for ease of clearing of Esophagus. (recommend this continue post d/c) Much education was given to pt on food preparation, food consistencies, Reflux precautions, and Esophageal phase dysmotility especially w/ certain foods impacting more significantly. Discussed ways to prep foods and alternate foods w/ liquids. Encouraged her to monitor for signs of fullness and stop eating taking a rest break to allow for clearing.  Recommend continue the [place] as recommended by GI w/ thin liquids; general aspiration and strict REFLUX precautions. Pills crushed in puree.  Handouts on above given to pt for home use w/ family. No further skilled ST services indicated at this time. Recommend continued f/u w/ GI for management, education of Esophagus, dysmotility. NSG to reconsult if new swallowing issues arise while admitted. Pt agreed. MD/NSG updated.     HPI HPI: Pt is a 65 y.o. African-American female with medical history significant for hypertension, hyponatremia, gout, type 2 diabetes mellitus and  osteoarthritis, who presented to the emergency room with acute onset of altered mental status.  Per her daughter who called EMS today as the patient has been having confusion over the last couple of days.  She stated that she was having slurred speech and has been sleepy.  2 days ago she went to a funeral and was doing well been however after that she has been spacing out per her daughter.  She was positive for COVID-19 on 07/19/2020.  She has been having nasal congestion, sore throat and postnasal drip as well as diarrhea.  The patient stated that she was having nausea and vomiting.  Chest CT revealed: "patchy groundglass opacities in the right upper lobe consistent with prior COVID-19 infection.  There was significant dilatation in the esophagus with air and in digested food that may be related to Reflux".  CT of the abdomen and pelvis showed no acute abnormalities.  Noncontrasted head CT scan revealed no acute intracranial abnormalities.  EGD in 10/2019 revealed: "esophageal stenoses. Dilated;  LA Grade D erosive esophagitis with no bleeding;  Erosive gastropathy with no bleeding".      SLP Plan  All goals met       Recommendations  Diet recommendations: Dysphagia 3 (mechanical soft);Dysphagia 2 (fine chop);Thin liquid Liquids provided via: Cup;Straw Medication Administration: Crushed with puree (for ease of swallowing - Esophageal) Supervision: Patient able to self feed Compensations: Minimize environmental distractions;Slow rate;Small sips/bites;Lingual sweep for clearance of pocketing;Follow solids with liquid Postural Changes and/or Swallow Maneuvers: Seated upright 90 degrees;Out of bed for meals;Upright 30-60 min after meal (Reflux precautions)                General recommendations:  (Dietician f/u; GI f/u scheduled for  2 weeks per notes) Oral Care Recommendations: Oral care BID;Patient independent with oral care Follow up Recommendations: None SLP Visit Diagnosis: Dysphagia,  unspecified (R13.10) (Esophageal phase deficits) Plan: All goals met       GO                 Orinda Kenner, MS, CCC-SLP Speech Language Pathologist [facility] [phone] [facility] 08/27/2020, 3:50 PM

## 2020-09-10 ENCOUNTER — Ambulatory Visit: Payer: Medicaid Other | Admitting: Gastroenterology

## 2020-09-10 ENCOUNTER — Encounter: Payer: Self-pay | Admitting: Gastroenterology

## 2020-09-30 ENCOUNTER — Other Ambulatory Visit: Payer: Self-pay

## 2020-09-30 DIAGNOSIS — Z8616 Personal history of COVID-19: Secondary | ICD-10-CM | POA: Insufficient documentation

## 2020-09-30 DIAGNOSIS — R110 Nausea: Secondary | ICD-10-CM | POA: Insufficient documentation

## 2020-09-30 DIAGNOSIS — I10 Essential (primary) hypertension: Secondary | ICD-10-CM | POA: Diagnosis not present

## 2020-09-30 DIAGNOSIS — E119 Type 2 diabetes mellitus without complications: Secondary | ICD-10-CM | POA: Diagnosis not present

## 2020-09-30 DIAGNOSIS — Z79899 Other long term (current) drug therapy: Secondary | ICD-10-CM | POA: Insufficient documentation

## 2020-09-30 DIAGNOSIS — R1084 Generalized abdominal pain: Secondary | ICD-10-CM | POA: Diagnosis not present

## 2020-09-30 DIAGNOSIS — F17210 Nicotine dependence, cigarettes, uncomplicated: Secondary | ICD-10-CM | POA: Insufficient documentation

## 2020-09-30 NOTE — ED Triage Notes (Signed)
Pt states she is having some abd pain, pt states she thinks she passed a gallstone yesterday. Pt states n/v.

## 2020-10-01 ENCOUNTER — Emergency Department: Payer: Medicare Other

## 2020-10-01 ENCOUNTER — Emergency Department
Admission: EM | Admit: 2020-10-01 | Discharge: 2020-10-01 | Disposition: A | Discharge status: Home / Self Care | Payer: Medicare Other | Attending: Emergency Medicine | Admitting: Emergency Medicine

## 2020-10-01 DIAGNOSIS — R1084 Generalized abdominal pain: Secondary | ICD-10-CM | POA: Diagnosis not present

## 2020-10-01 DIAGNOSIS — R1011 Right upper quadrant pain: Secondary | ICD-10-CM

## 2020-10-01 LAB — URINALYSIS, COMPLETE (UACMP) WITH MICROSCOPIC
Bacteria, UA: NONE SEEN
Bilirubin Urine: NEGATIVE
Glucose, UA: NEGATIVE mg/dL
Ketones, ur: NEGATIVE mg/dL
Leukocytes,Ua: NEGATIVE
Nitrite: NEGATIVE
Protein, ur: NEGATIVE mg/dL
Specific Gravity, Urine: 1.043 — ABNORMAL HIGH (ref 1.005–1.030)
pH: 6 (ref 5.0–8.0)

## 2020-10-01 LAB — CBC WITH DIFFERENTIAL/PLATELET
Abs Immature Granulocytes: 0.03 10*3/uL (ref 0.00–0.07)
Basophils Absolute: 0 10*3/uL (ref 0.0–0.1)
Basophils Relative: 1 %
Eosinophils Absolute: 0 10*3/uL (ref 0.0–0.5)
Eosinophils Relative: 0 %
HCT: 36.3 % (ref 36.0–46.0)
Hemoglobin: 12.1 g/dL (ref 12.0–15.0)
Immature Granulocytes: 1 %
Lymphocytes Relative: 15 %
Lymphs Abs: 1 10*3/uL (ref 0.7–4.0)
MCH: 25.2 pg — ABNORMAL LOW (ref 26.0–34.0)
MCHC: 33.3 g/dL (ref 30.0–36.0)
MCV: 75.5 fL — ABNORMAL LOW (ref 80.0–100.0)
Monocytes Absolute: 0.7 10*3/uL (ref 0.1–1.0)
Monocytes Relative: 10 %
Neutro Abs: 4.9 10*3/uL (ref 1.7–7.7)
Neutrophils Relative %: 73 %
Platelets: 346 10*3/uL (ref 150–400)
RBC: 4.81 MIL/uL (ref 3.87–5.11)
RDW: 15 % (ref 11.5–15.5)
WBC: 6.6 10*3/uL (ref 4.0–10.5)
nRBC: 0 % (ref 0.0–0.2)

## 2020-10-01 LAB — LIPASE, BLOOD: Lipase: 24 U/L (ref 11–51)

## 2020-10-01 LAB — HEPATIC FUNCTION PANEL
ALT: 9 U/L (ref 0–44)
AST: 21 U/L (ref 15–41)
Albumin: 4.6 g/dL (ref 3.5–5.0)
Alkaline Phosphatase: 61 U/L (ref 38–126)
Bilirubin, Direct: 0.2 mg/dL (ref 0.0–0.2)
Indirect Bilirubin: 1.1 mg/dL — ABNORMAL HIGH (ref 0.3–0.9)
Total Bilirubin: 1.3 mg/dL — ABNORMAL HIGH (ref 0.3–1.2)
Total Protein: 9.7 g/dL — ABNORMAL HIGH (ref 6.5–8.1)

## 2020-10-01 LAB — BASIC METABOLIC PANEL
Anion gap: 13 (ref 5–15)
BUN: 27 mg/dL — ABNORMAL HIGH (ref 8–23)
CO2: 23 mmol/L (ref 22–32)
Calcium: 10.4 mg/dL — ABNORMAL HIGH (ref 8.9–10.3)
Chloride: 91 mmol/L — ABNORMAL LOW (ref 98–111)
Creatinine, Ser: 0.63 mg/dL (ref 0.44–1.00)
GFR, Estimated: >60 mL/min (ref >60)
Glucose, Bld: 162 mg/dL — ABNORMAL HIGH (ref 70–99)
Potassium: 3.4 mmol/L — ABNORMAL LOW (ref 3.5–5.1)
Sodium: 127 mmol/L — ABNORMAL LOW (ref 135–145)

## 2020-10-01 IMAGING — MR MR ABDOMEN WO/W CM MRCP
19 of 20 series · 45 of 48 positions shown · IV contrast (gadavist)
Comparison: [DATE] CT abdomen/pelvis and right upper quadrant
abdominal sonogram.

CLINICAL DATA: Right upper quadrant and epigastric abdominal pain.
Elevated bilirubin. Biliary ductal dilatation on imaging.

EXAM:
MRI ABDOMEN WITHOUT AND WITH CONTRAST (INCLUDING MRCP)
TECHNIQUE: Multiplanar multisequence MR imaging of the abdomen was performed
both before and after the administration of intravenous contrast.
Heavily T2-weighted images of the biliary and pancreatic ducts were
obtained, and three-dimensional MRCP images were rendered by post
processing.
CONTRAST:  6mL GADAVIST GADOBUTROL 1 MMOL/ML IV SOLN

[Series 3: T2 · coronal · 6.0mm · 1.19mm/px · 1 of 35 slices shown (1 of 2)]
[im 1/35]
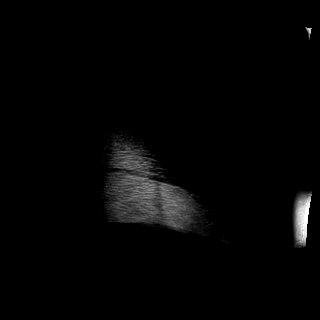

[Series 4: in & out · axial · 3.0mm · 1.19mm/px · z∈[-116,+96]mm · 2 of 72 slices shown (1 of 2)]
[im 1/72]
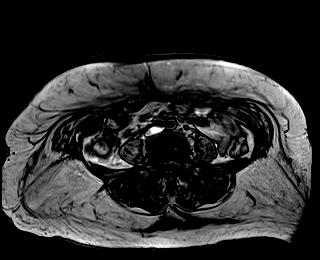
[im 72/72]
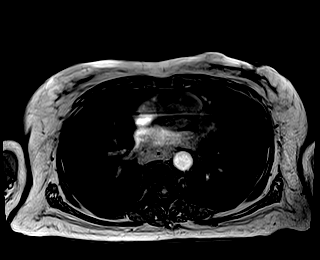

[Series 5: in & out · axial · 3.0mm · 1.19mm/px · z∈[-116,+96]mm · 3 of 72 slices shown (2 of 2)]
[im 1/72]
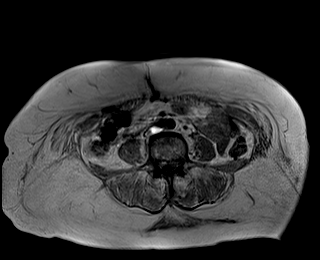
[im 36/72]
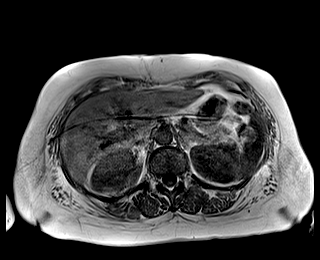
[im 72/72]
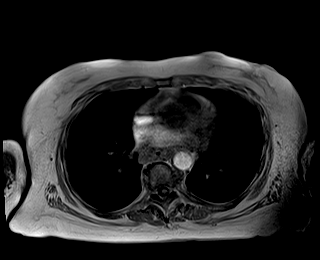

[Series 6: T2 · axial · 6.0mm · 1.19mm/px · 1 of 32 slices shown (2 of 2)]
[im 1/32]
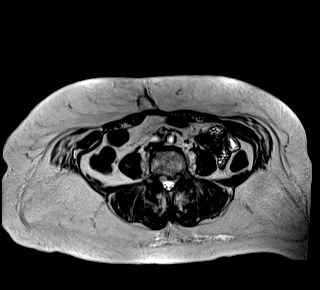

[Series 9: T2 fat-sat · axial · 6.0mm · 1.19mm/px · 1 of 30 slices shown]
[im 1/30]
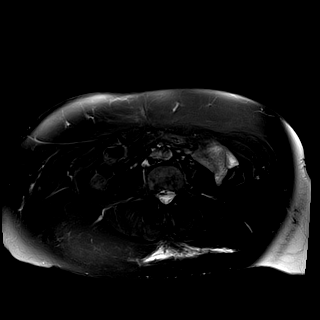

[Series 10: ax dwi_tracew · axial · 6.0mm · 1.42mm/px · z∈[-116,+92]mm · 4 of 90 slices shown]
[im 1/90]
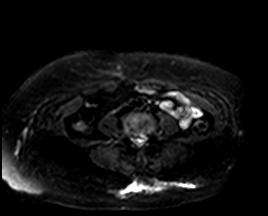
[im 30/90]
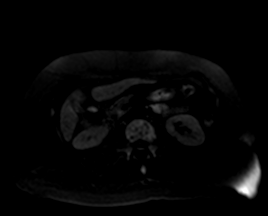
[im 60/90]
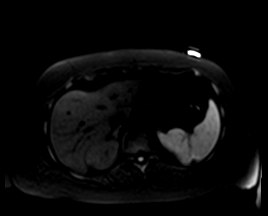
[im 90/90]
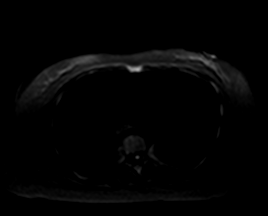

[Series 11: ax dwi_adc · axial · 6.0mm · 1.42mm/px · 1 of 30 slices shown]
[im 1/30]
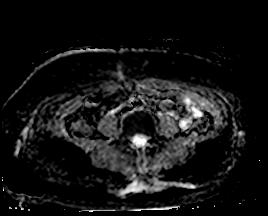

[Series 15: MRCP · coronal · 3.0mm · 1.12mm/px · 1 of 17 slices shown]
[im 1/17]
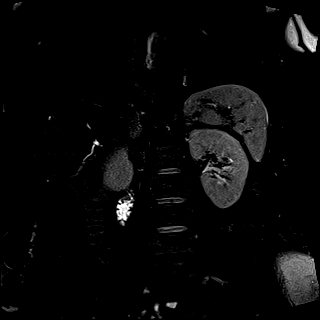

[Series 16: radials · coronal · 50.0mm · 0.78mm/px · 1 of 5 slices shown]
[im 1/5]
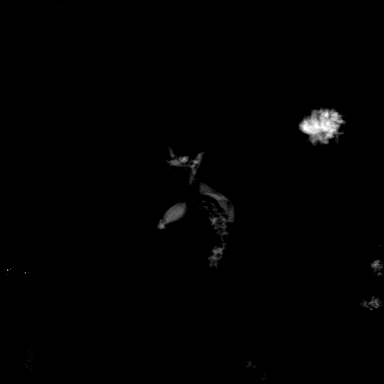

[Series 17: T1 dynamic fat-sat · axial · non-contrast · 3.0mm · 1.19mm/px · z∈[-116,+96]mm · 3 of 72 slices shown (1 of 5)]
[im 1/72]
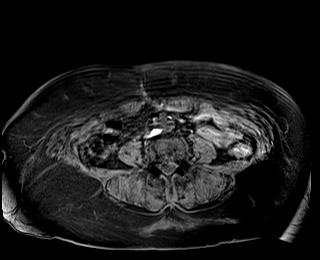
[im 36/72]
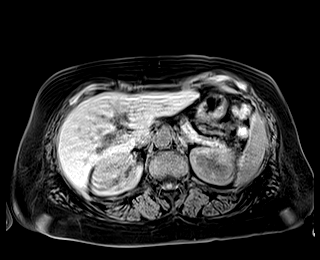
[im 72/72]
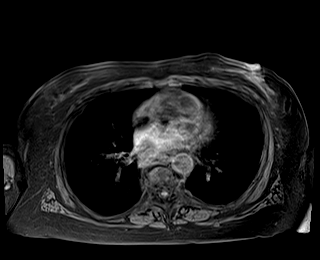

[Series 18: T1 dynamic fat-sat post-contrast · axial · 3.0mm · 1.19mm/px · z∈[-116,+96]mm · 3 of 72 slices shown (1 of 4)]
[im 1/72]
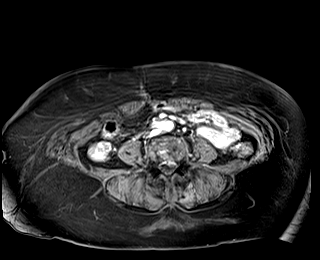
[im 36/72]
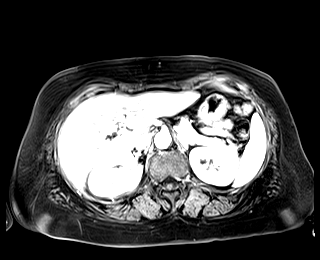
[im 72/72]
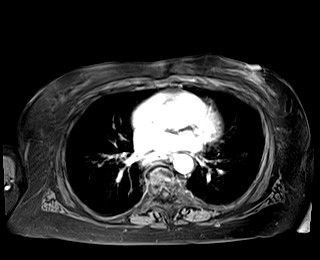

[Series 19: T1 dynamic fat-sat · axial · 3.0mm · 1.19mm/px · z∈[-116,+96]mm · 3 of 72 slices shown (2 of 5)]
[im 1/72]
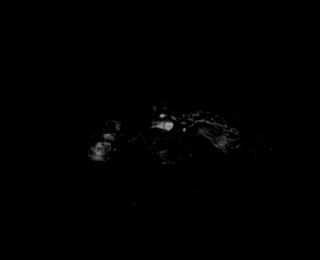
[im 36/72]
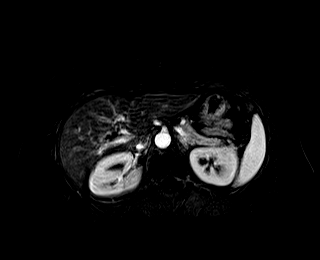
[im 72/72]
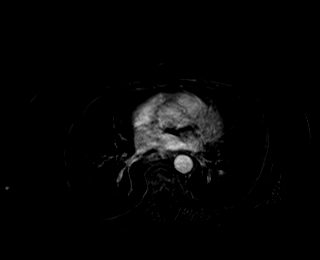

[Series 20: T1 dynamic fat-sat post-contrast · axial · 3.0mm · 1.19mm/px · z∈[-116,+96]mm · 3 of 72 slices shown (2 of 4)]
[im 1/72]
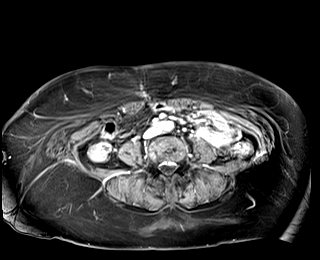
[im 36/72]
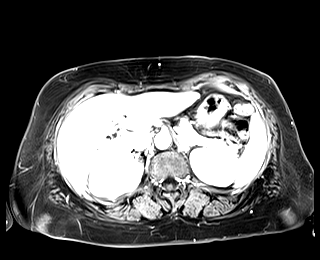
[im 72/72]
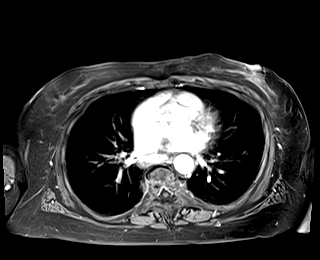

[Series 21: T1 dynamic fat-sat · axial · 3.0mm · 1.19mm/px · z∈[-116,+96]mm · 3 of 72 slices shown (3 of 5)]
[im 1/72]
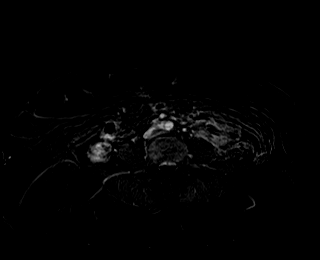
[im 36/72]
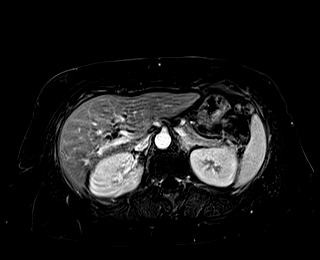
[im 72/72]
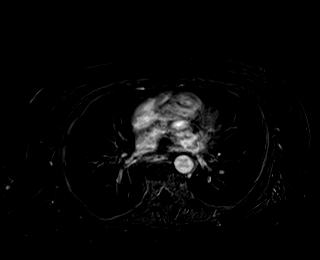

[Series 22: T1 dynamic fat-sat post-contrast · axial · 3.0mm · 1.19mm/px · z∈[-116,+96]mm · 3 of 72 slices shown (3 of 4)]
[im 1/72]
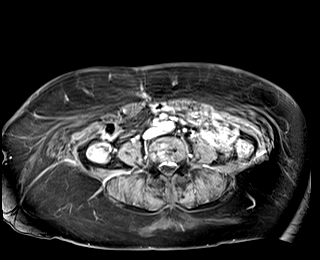
[im 36/72]
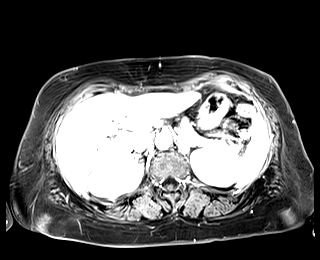
[im 72/72]
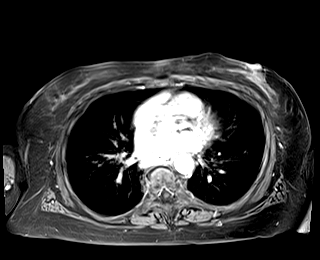

[Series 23: T1 dynamic fat-sat · axial · 3.0mm · 1.19mm/px · z∈[-116,+96]mm · 3 of 72 slices shown (4 of 5)]
[im 1/72]
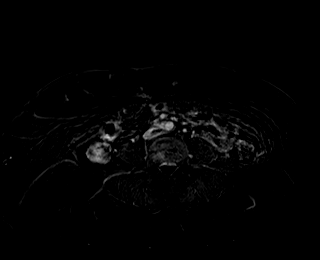
[im 36/72]
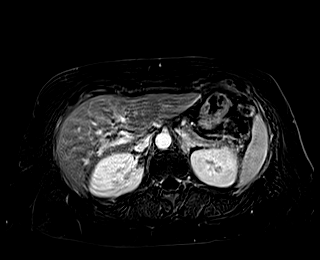
[im 72/72]
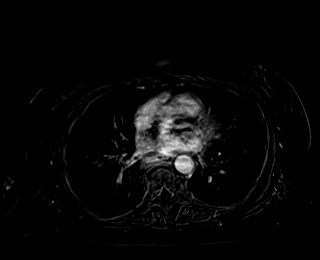

[Series 24: T1 dynamic post-contrast · coronal · 3.0mm · 1.31mm/px · 3 of 72 slices shown]
[im 1/72]
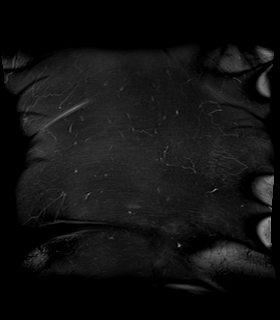
[im 36/72]
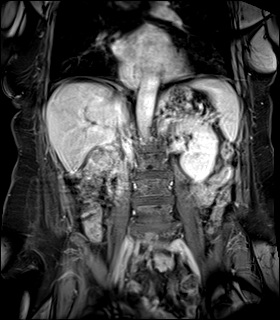
[im 72/72]
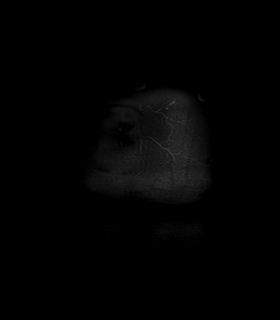

[Series 25: T1 dynamic fat-sat post-contrast · axial · 3.0mm · 1.19mm/px · z∈[-116,+96]mm · 3 of 72 slices shown (4 of 4)]
[im 1/72]
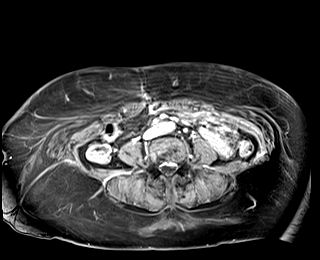
[im 36/72]
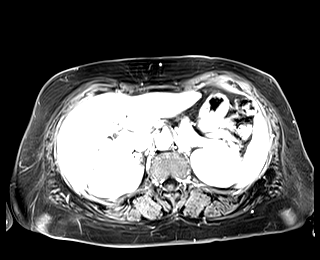
[im 72/72]
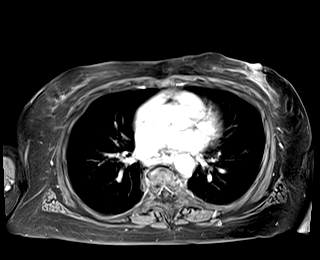

[Series 26: T1 dynamic fat-sat · axial · 3.0mm · 1.19mm/px · z∈[-116,+96]mm · 3 of 72 slices shown (5 of 5)]
[im 1/72]
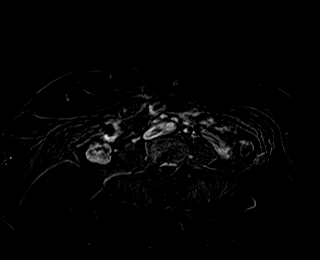
[im 36/72]
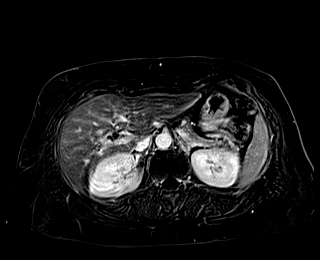
[im 72/72]
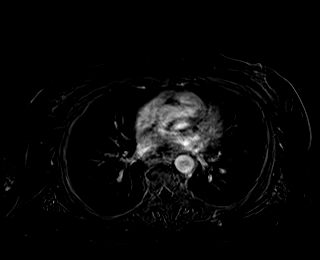

[45 of 48 positions shown; findings below may reference images not displayed]

FINDINGS: Lower chest: Moderate circumferential wall thickening in the lower
thoracic esophagus, as better seen on CT study. No acute abnormality
at the lung bases.

Hepatobiliary: Normal liver size and configuration. No hepatic
steatosis. No liver mass. Normal nondistended gallbladder with no
cholelithiasis. Mild diffuse intrahepatic biliary ductal dilatation.
Dilated common bile duct with diameter 8 mm. No biliary filling
defects to suggest choledocholithiasis. Slightly sharp tapering of
the CBD at the level of the ampulla, cannot exclude ampullary
stricture. No discrete ampullary mass. Otherwise no evidence of
biliary strictures, masses or beading.

Pancreas: No pancreatic mass or duct dilation.  No pancreas divisum.

Spleen: Normal size. No mass.

Adrenals/Urinary Tract: Normal adrenals. No hydronephrosis. Normal
kidneys with no renal mass.

Stomach/Bowel: Normal non-distended stomach. Visualized small and
large bowel is normal caliber, with no bowel wall thickening.

Vascular/Lymphatic: Atherosclerotic nonaneurysmal abdominal aorta.
Patent portal, splenic, hepatic and renal veins. No pathologically
enlarged lymph nodes in the abdomen.

Other: No abdominal ascites or focal fluid collection.

Musculoskeletal: No aggressive appearing focal osseous lesions.
IMPRESSION: 1. Mild diffuse intrahepatic and extrahepatic biliary ductal
dilatation. Common bile duct diameter 8 mm. Normal gallbladder with
no cholelithiasis. No choledocholithiasis. Slightly sharp tapering
of the CBD at the level of the ampulla, cannot exclude ampullary
stricture. No discrete biliary or ampullary mass.
2. Nonspecific moderate circumferential wall thickening in the lower
thoracic esophagus, as better seen on CT study. GI consultation
suggested for consideration of endoscopic correlation.

## 2020-10-01 IMAGING — US US ABDOMEN LIMITED RUQ/ASCITES
1 series · 14 of 25 positions shown · non-contrast
Comparison: Abdominal CT from earlier today

CLINICAL DATA: Right upper quadrant pain since yesterday

EXAM:
ULTRASOUND ABDOMEN LIMITED RIGHT UPPER QUADRANT

[Series 1: us abdomen limited ruq (liver/gb) · 14 of 29 slices shown]
[im 1/29]
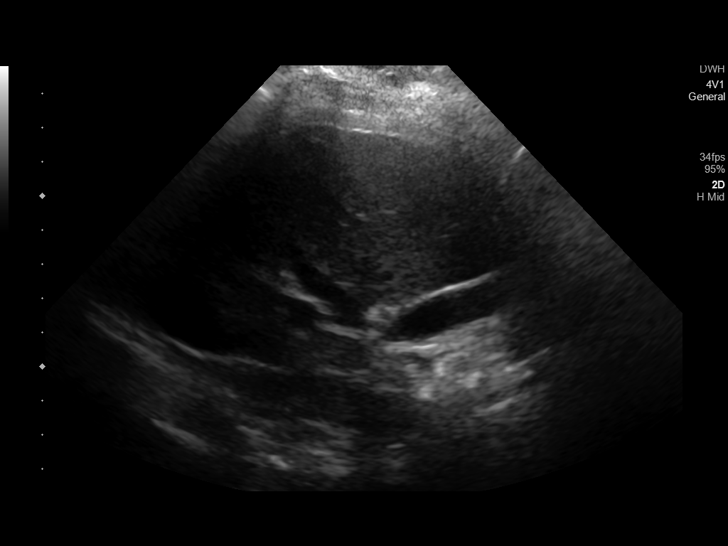
[im 3/29]
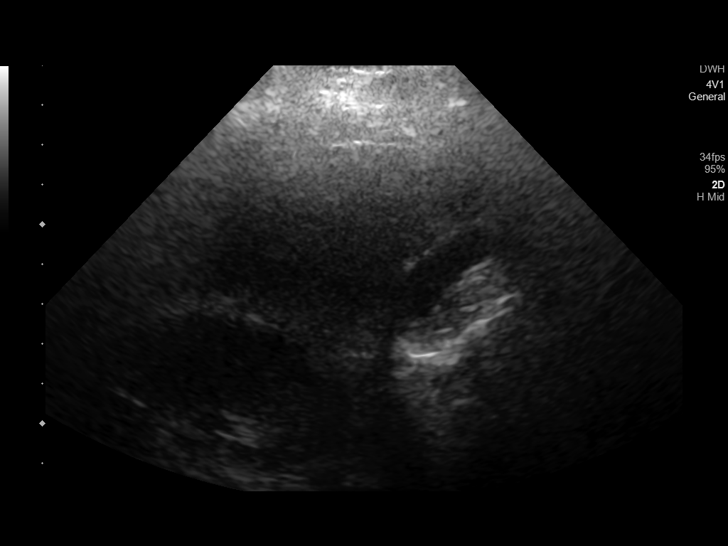
[im 5/29]
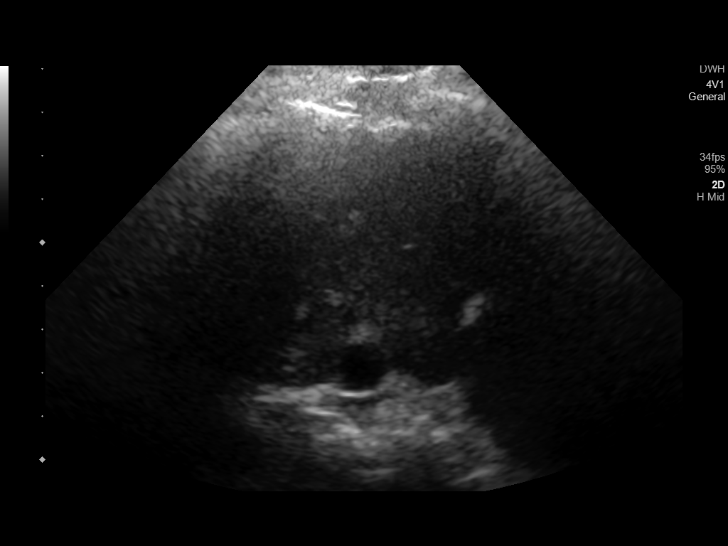
[im 8/29]
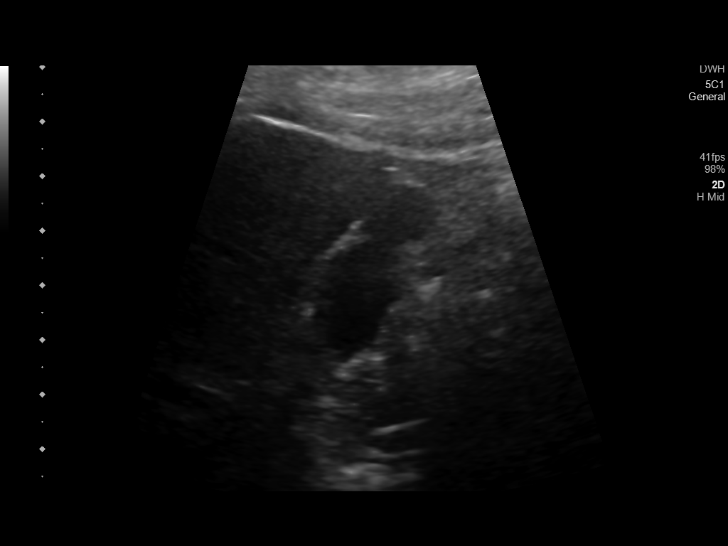
[im 10/29]
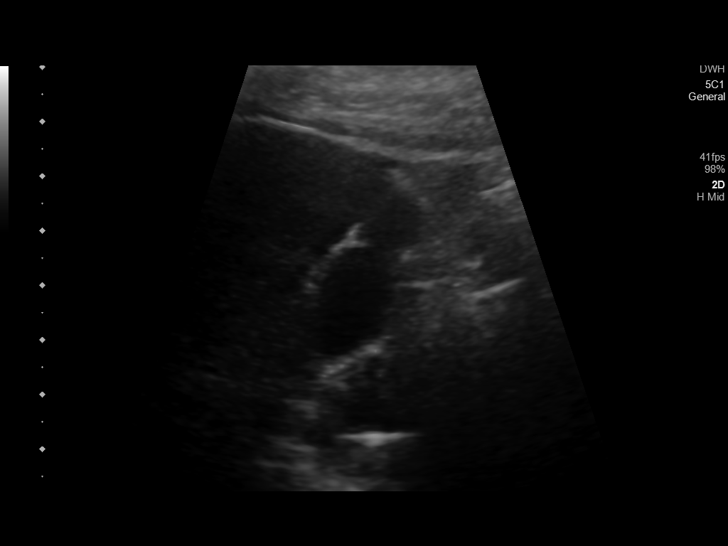
[im 11/29]
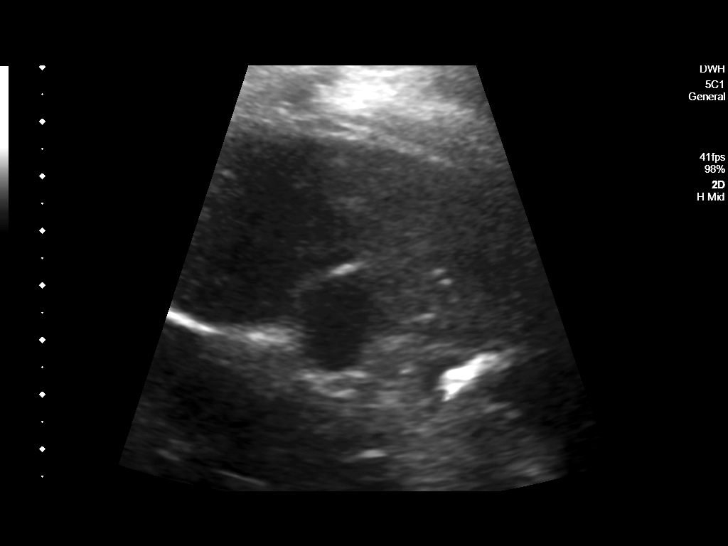
[im 13/29]
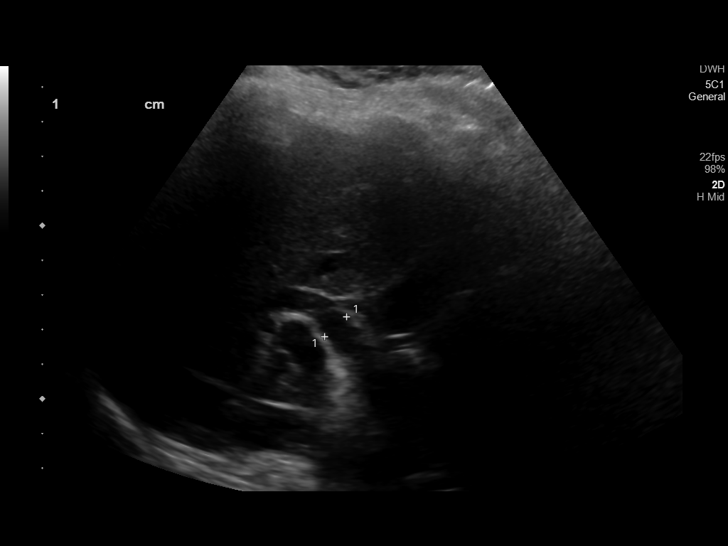
[im 16/29]
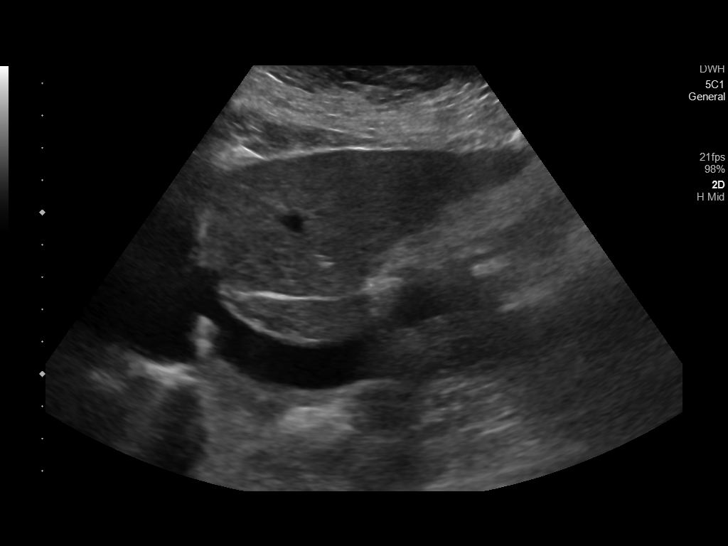
[im 18/29]
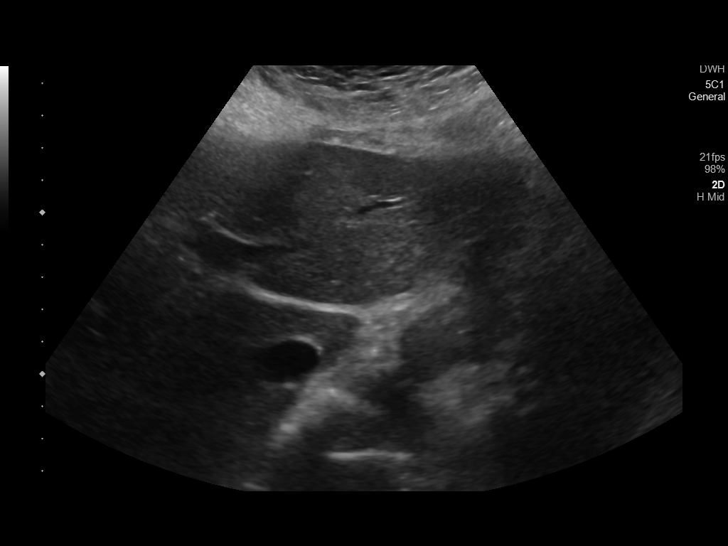
[im 19/29]
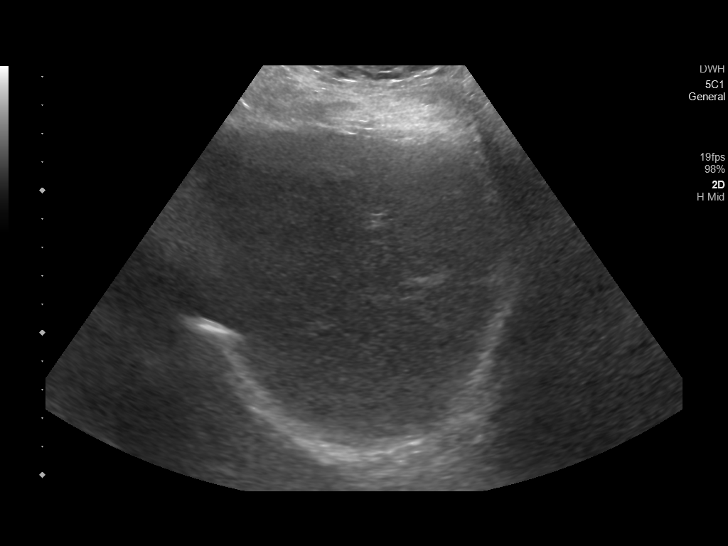
[im 22/29]
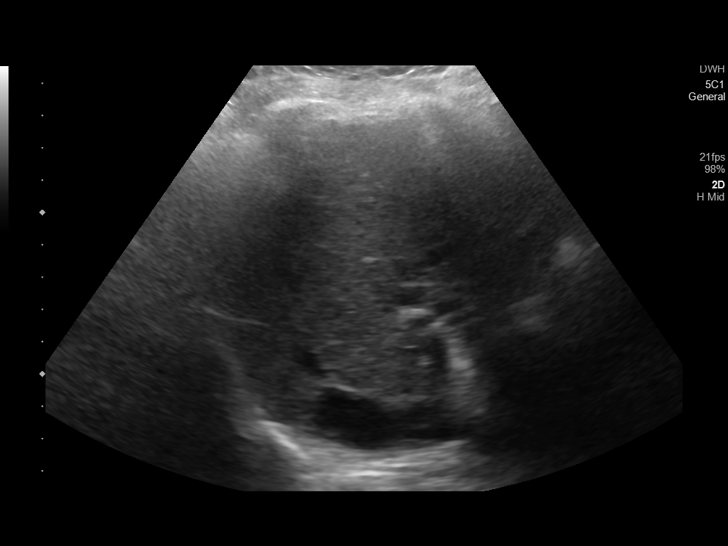
[im 24/29]
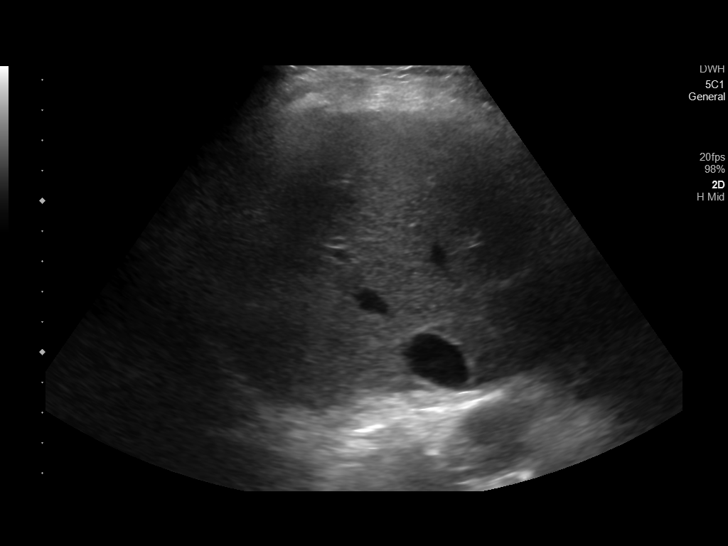
[im 26/29]
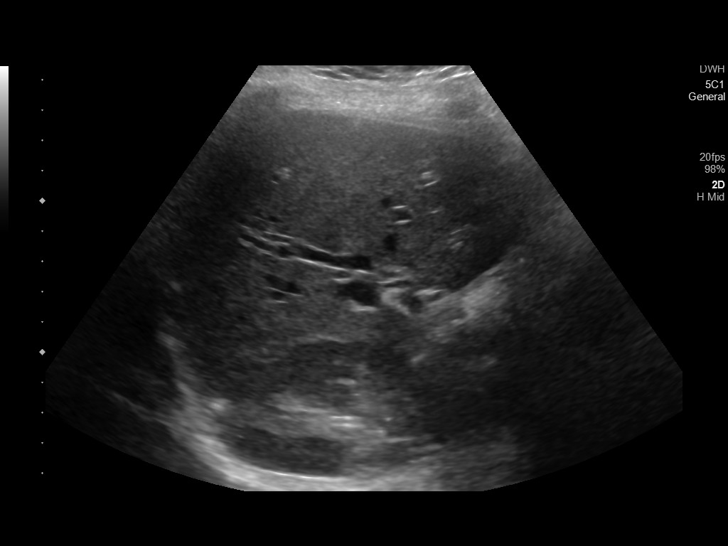
[im 29/29]
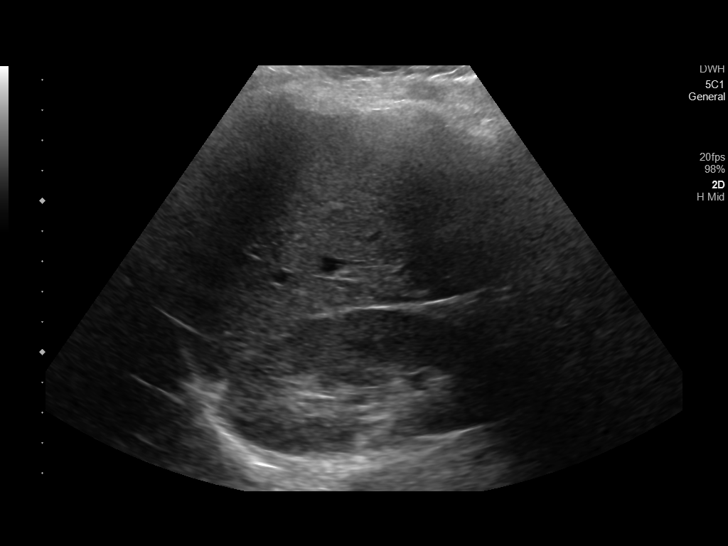

[14 of 25 positions shown; findings below may reference images not displayed]

FINDINGS: Gallbladder:

No gallstones or wall thickening visualized. No sonographic Murphy
sign noted by sonographer.

Common bile duct:

Diameter: 9 mm.  Where visualized, no filling defect.

Liver:

No focal lesion identified. Within normal limits in parenchymal
echogenicity. Intrahepatic bile duct dilatation. Portal vein is
patent on color Doppler imaging with normal direction of blood flow
towards the liver.
IMPRESSION: Biliary duct dilatation without visible cause.  No cholelithiasis.

## 2020-10-01 IMAGING — CT CT ABD-PELV W/ CM
2 of 5 series · 14 of 46 positions shown, 16 images · IV contrast (omnipaque)
Comparison: CT [DATE]

CLINICAL DATA: Nonlocalized intermittent abdominal pain, right
upper quadrant and suprapubic, patient believes she passed a
gallstone or ureteral calculus

EXAM:
CT ABDOMEN AND PELVIS WITH CONTRAST
TECHNIQUE: Multidetector CT imaging of the abdomen and pelvis was performed
using the standard protocol following bolus administration of
intravenous contrast.
CONTRAST:  100mL OMNIPAQUE IOHEXOL 300 MG/ML  SOLN

[Series 2: routine abd/pel with · axial · 0.91mm/px · z∈[-882,-502]mm · 11 of 88 slices shown, 13 images]
[im 6/88  soft-tissue]
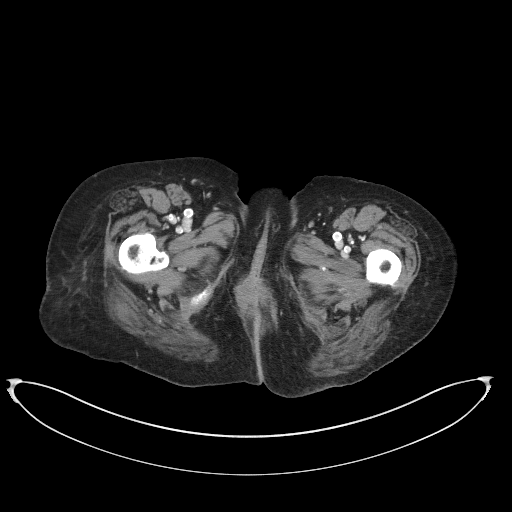
[im 6/88  bone]
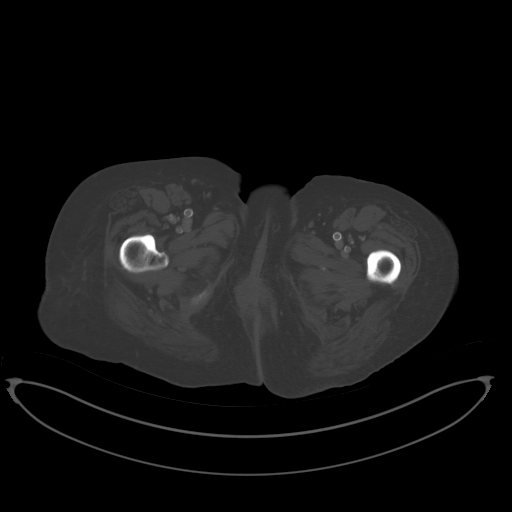
[im 16/88  soft-tissue]
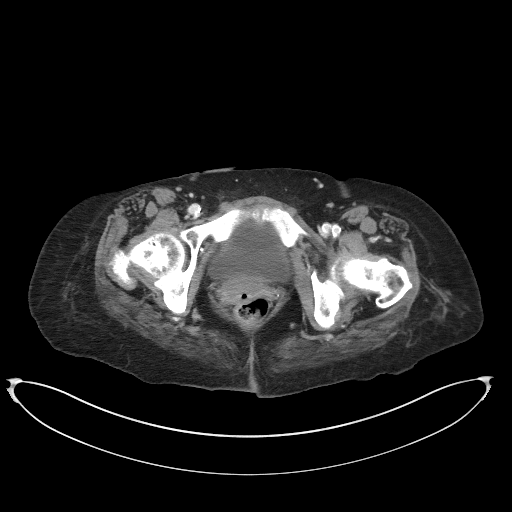
[im 21/88  soft-tissue]
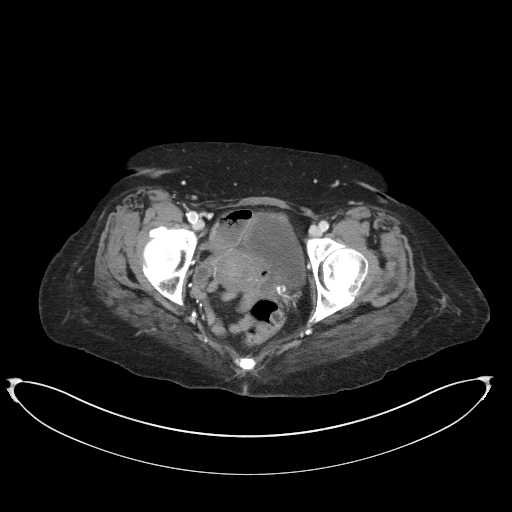
[im 31/88  soft-tissue]
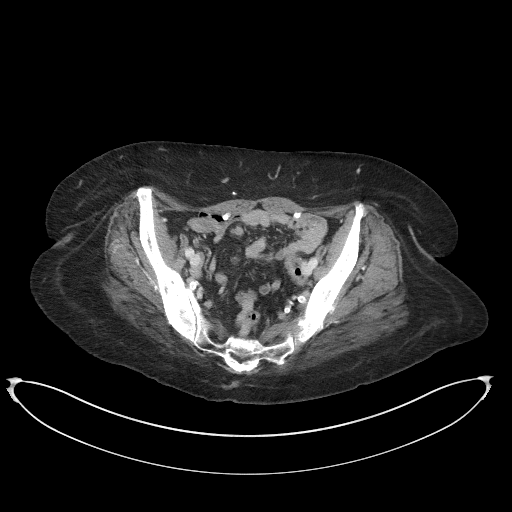
[im 36/88  soft-tissue]
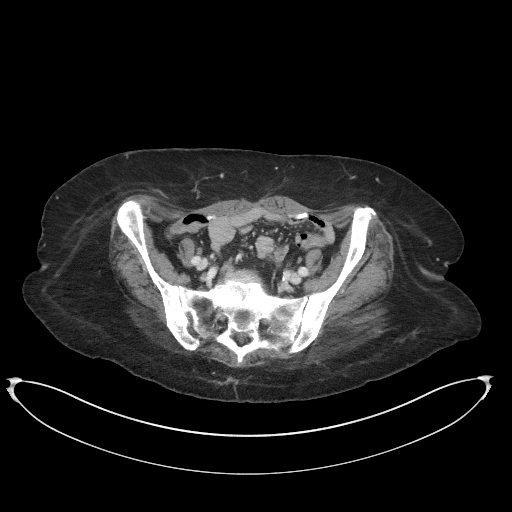
[im 47/88  soft-tissue]
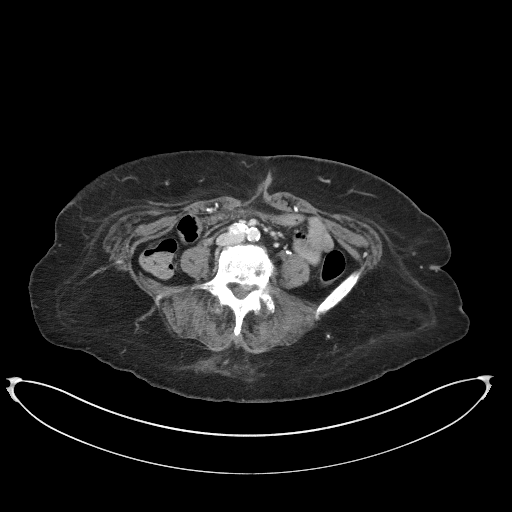
[im 52/88  soft-tissue]
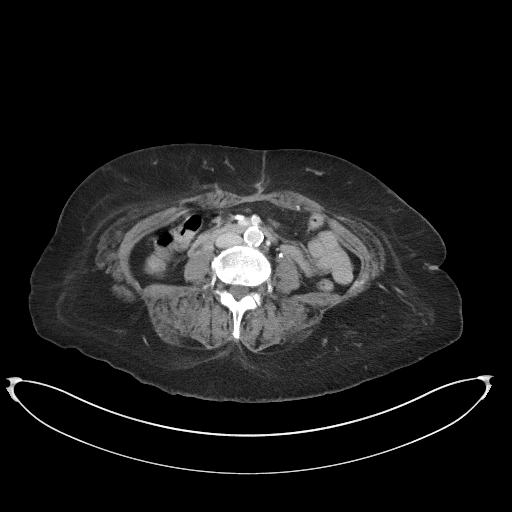
[im 57/88  soft-tissue]
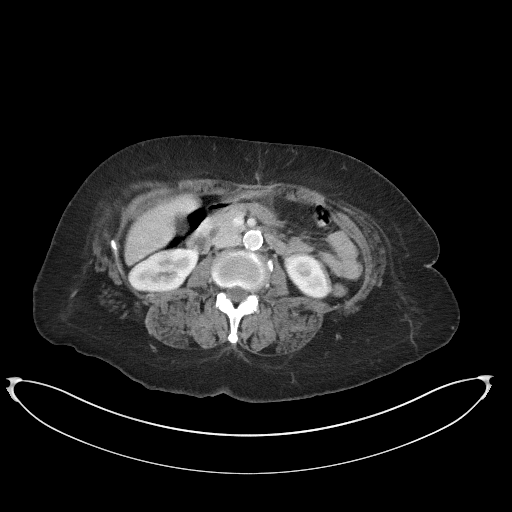
[im 67/88  soft-tissue]
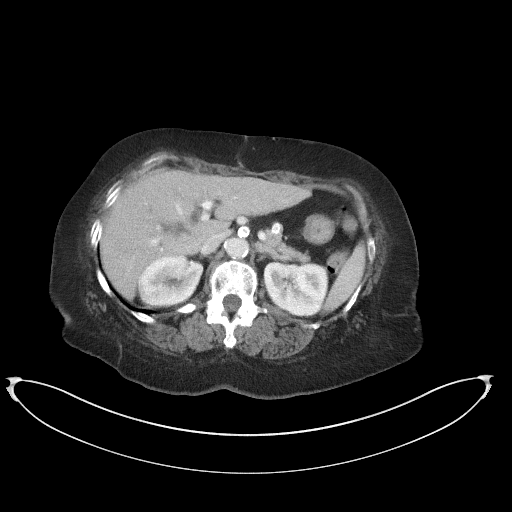
[im 67/88  bone]
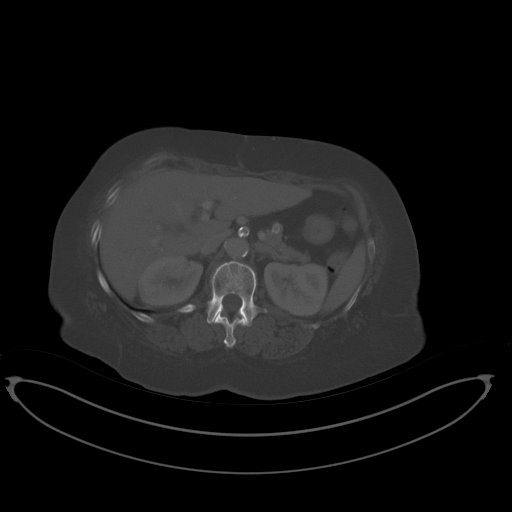
[im 72/88  soft-tissue]
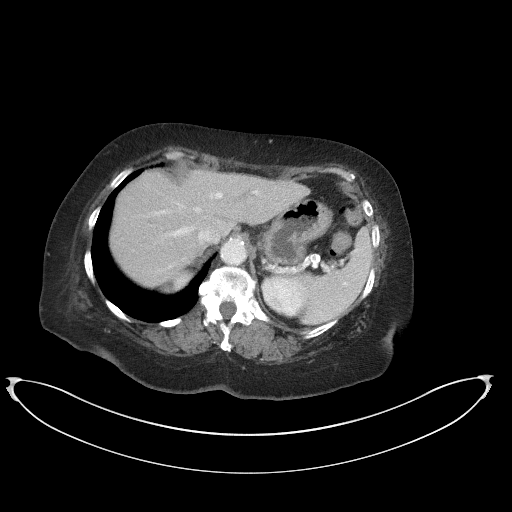
[im 82/88  soft-tissue]
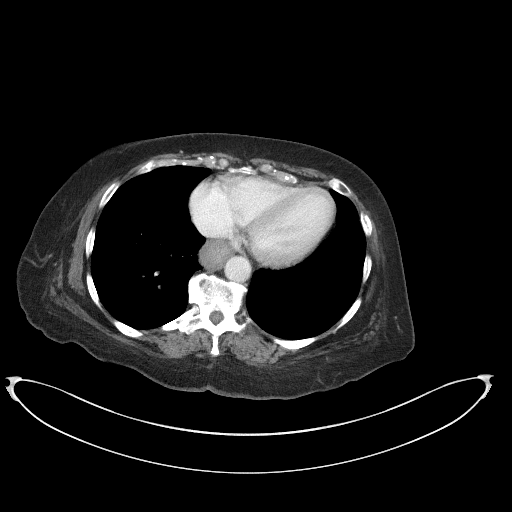

[Series 5: coronal st · coronal · 0.62mm/px · 3 of 83 slices shown]
[im 28/83  soft-tissue]
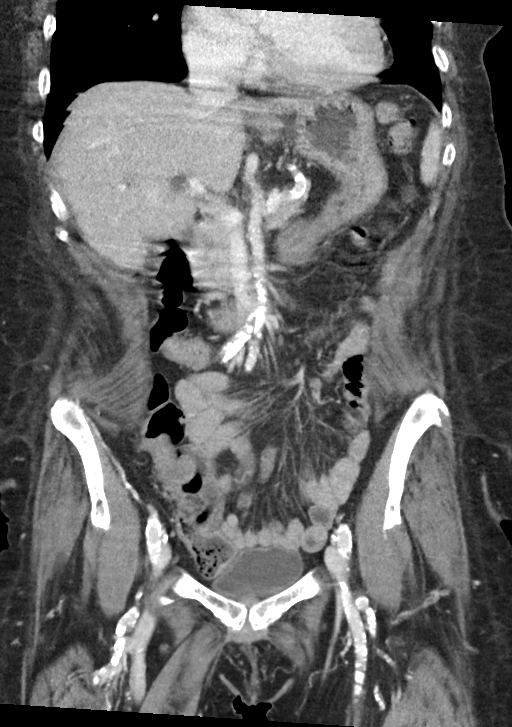
[im 37/83  soft-tissue]
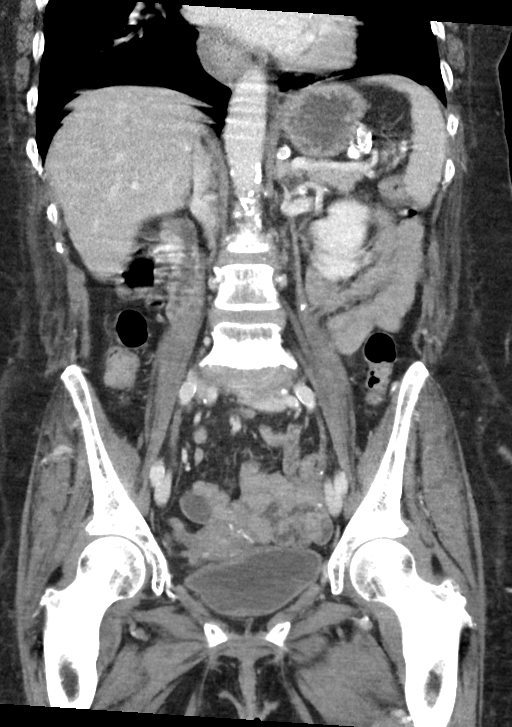
[im 46/83  soft-tissue]
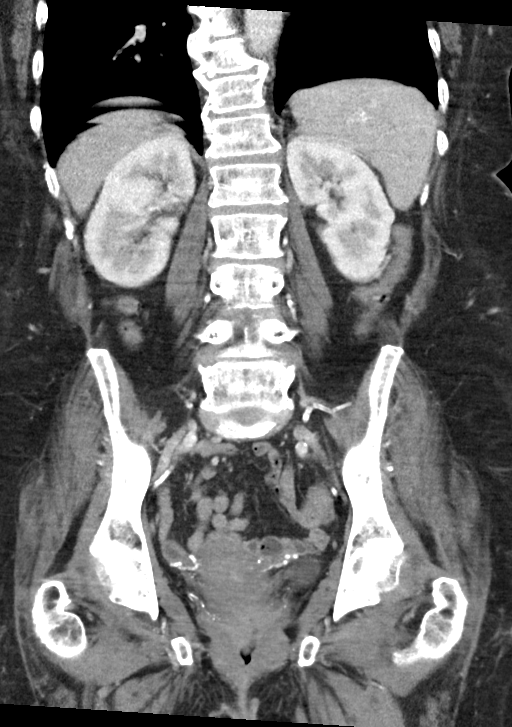

[14 of 46 positions shown; findings below may reference images not displayed]

FINDINGS: Lower chest: Lung bases are clear. Normal heart size. No pericardial
effusion. Coronary artery calcifications.

Hepatobiliary: Normal hepatic attenuation. Smooth surface contour.
No concerning focal liver lesion. Intra extrahepatic biliary ductal
dilatation. Gallbladder is partially decompressed at the time of
exam. No pericholecystic fluid or inflammation or visible gallstones
in the gallbladder lumen or biliary tree.

Pancreas: Mild pancreatic atrophy. No pancreatic ductal dilatation
or surrounding inflammatory changes.

Spleen: Normal in size. No concerning splenic lesions.

Adrenals/Urinary Tract: Normal adrenal glands. Kidneys are normally
located with symmetric enhancement and excretion. No suspicious
renal lesion, urolithiasis or hydronephrosis. Mild bladder wall
thickening is possibly related to underdistention. No visible
bladder calculi or debris.

Stomach/Bowel: Circumferentially thickened distal thoracic esophagus
without para esophageal free fluid or gas. Stomach and duodenum have
a more normal appearance. Normal sweep of the duodenum across the
midline abdomen. No small bowel thickening or dilatation. Normal
appearing appendix in the right lower quadrant without inflammation
to suggest acute appendicitis. No colonic dilatation or wall
thickening. Scattered colonic diverticula without focal inflammation
to suggest diverticulitis. No evidence of bowel obstruction.

Vascular/Lymphatic: Extensive atherosclerotic calcifications of the
abdominal aorta and branch vessels as well as within both inferior
epigastric arteries. No suspicious or enlarged lymph nodes in the
included lymphatic chains.

Reproductive: Anteverted uterus. Extensive parametrial
calcifications are typically a benign incidental finding. Similar to
prior. No concerning adnexal lesion.

Other: No abdominopelvic free air or fluid. No bowel containing
hernia.

Musculoskeletal: Pelvic floor laxity. The osseous structures appear
diffusely demineralized which may limit detection of small or
nondisplaced fractures. No acute osseous abnormality or suspicious
osseous lesion. Multilevel degenerative changes are present in the
imaged portions of the spine. Features most pronounced at L4-5 L5-S1
where there is grade 1 anterolisthesis at both levels, global disc
bulges, endplate spurring and facet arthropathy resulting moderate
to severe canal stenoses and foraminal narrowing at these levels.
IMPRESSION: 1. Circumferential thickening of the distal thoracic esophagus,
recommend assessment for clinical features of esophagitis and
consideration of endoscopy as patient is able to tolerate.
2. Intra and extrahepatic biliary ductal dilatation without visible
intraductal gallstone. Radiolucent gallstone or recent passage of a
gallstone could have a similar appearance. Consider evaluation with
serologies and if abnormal, MRCP could be obtained. This
recommendation follows ACR consensus guidelines: White Paper of the
ACR Incidental Findings Committee II on Gallbladder and Biliary
Findings. [HOSPITAL] [0G]:;[DATE].
3. Mild circumferential thickening of the urinary bladder, possibly
related to underdistention though could correlate with urinalysis to
exclude cystitis. No obstructive uropathy or urolithiasis.
4. Colonic diverticulosis without evidence of acute diverticulitis.
5. Extensive vascular calcifications ( Aortic Atherosclerosis
([0G]-[0G]).)
6. Pelvic floor laxity.

## 2020-10-01 MED ORDER — LACTATED RINGERS IV BOLUS
1000.0000 mL | Freq: Once | INTRAVENOUS | Status: AC
  Administered 2020-10-01: 1000 mL via INTRAVENOUS

## 2020-10-01 MED ORDER — ONDANSETRON HCL 4 MG/2ML IJ SOLN: 4.0000 mg | Freq: Once | INTRAMUSCULAR | Status: AC

## 2020-10-01 MED ORDER — GADOBUTROL 1 MMOL/ML IV SOLN
6.0000 mL | Freq: Once | INTRAVENOUS | Status: AC | PRN
  Administered 2020-10-01: 6 mL via INTRAVENOUS

## 2020-10-01 MED ORDER — ONDANSETRON HCL 4 MG/2ML IJ SOLN
INTRAMUSCULAR | Status: AC
  Administered 2020-10-01: 4 mg via INTRAVENOUS
  Filled 2020-10-01: qty 2

## 2020-10-01 MED ORDER — ONDANSETRON 4 MG PO TBDP: 4.0000 mg | ORAL_TABLET | Freq: Three times a day (TID) | ORAL | 0 refills | Status: AC | PRN

## 2020-10-01 MED ORDER — IOHEXOL 300 MG/ML  SOLN
100.0000 mL | Freq: Once | INTRAMUSCULAR | Status: AC | PRN
  Administered 2020-10-01: 100 mL via INTRAVENOUS

## 2020-10-01 NOTE — ED Notes (Signed)
Patient advises this RN she will not provide urine specimen.  MD aware.

## 2020-10-01 NOTE — ED Provider Notes (Signed)
7:56 AM Assumed care for off going team.   Blood pressure 124/66, pulse 77, temperature 98.2 F (36.8 C), temperature source Oral, resp. rate 18, height 5\' 4"  (1.626 m), weight 65.8 kg, SpO2 100 %.  See their HPI for full report but in brief pending MRCP.  If negative could be discharged home  I did review the CT scan which was also concerned about the possibility of esophagitis and patient will need outpatient endoscopy.  I did discuss this with patient.  There is also concern for potential cystitis and will get UA.  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  Her upper abdomen is soft and nontender.  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  I did call her daughter and updated her on needing this follow-up.  I did also recommend that she follows up with her PCP for sodium recheck I discussed increasing the salt in her diet.  Patient is chronically low sodiums and family expressed understanding.  Patient was able to ambulate to the bathroom on her own and is currently asymptomatic.  I did prescribe a few Zofran for home but I did explain to daughter that if her symptoms are returning or getting worse that she can return to the ER for repeat evaluation.  Her urine had a few WBCs but otherwise was completely negative and patient really denies any significant symptoms.  We will send for urine culture so if positive they can reevaluate patient is developing symptoms given the abnormal CT scan but at this time I think it be best to hold off on antibiotics given afebrile, normal white count and otherwise well-appearing  I discussed the provisional nature of ED diagnosis, the treatment so far, the ongoing plan of care, follow up appointments and return  precautions with the patient and any family or support people present. They expressed understanding and agreed with the plan, discharged home.           Vanessa [place], MD 10/01/20 [phone]

## 2020-10-01 NOTE — ED Provider Notes (Signed)
[facility] Emergency Department Provider Note ____________________________________________   Event Date/Time   First MD Initiated Contact with Patient 10/01/20 0157     (approximate)  I have reviewed the triage vital signs and the nursing notes.  HISTORY  Chief Complaint Abdominal Pain   HPI Donna Aguirre is a 65 y.o. femalewho presents to the ED for evaluation of abd pain.   Chart review indicates HTN and GERD.  DM.  History of mild hyponatremia chronically.   Patient presents to the ED via POV for evaluation of intermittent abdominal pain and emesis over the past 2-3 days.  Patient reports she thinks she passed a stone yesterday, indicating a renal stone (not gallstone as indicated by triage note).  Patient reports she feels fine now and has no complaints.  Patient reports she presents because her daughter is crying and upset that she did not come to the ED yesterday when she was in more severe pain.  Patient does report some mild nausea right now but otherwise has no complaints and says she feels fine.  Denies recent fevers, dysuria, stool changes or diarrhea.  Denies chest pain or syncope.  Past Medical History:  Diagnosis Date  . Abdominal pain 11/22/2019  . Arthritis   . Diabetes mellitus without complication ([place])   . Gout   . Hypertension   . Hypokalemia 11/22/2019  . Hypomagnesemia 11/22/2019  . Hyponatremia 11/16/2018    Patient Active Problem List   Diagnosis Date Noted  . Hypertensive urgency 08/27/2020  . Encephalopathy 08/25/2020  . Electrolyte abnormality   . Gastroenteritis due to COVID-19 virus 07/20/2020  . Hypokalemia 07/19/2020  . COVID-19 virus detected 07/19/2020  . Nicotine dependence 07/19/2020  . E-coli UTI   . Tachycardia   . Esophageal stricture 11/23/2019  . Dysphagia   . Esophagitis   . Alcohol use disorder, moderate, dependence ([place]) 11/22/2019  . Elevated troponin 11/22/2019  . Pancytopenia ([place]) 11/22/2019   . HTN (hypertension) 11/22/2019  . History of esophageal stricture on EGD 12/2018 [facility] 11/22/2019  . PUD (peptic ulcer disease), with hx of perforated gastric ulcer 11/22/2019  . Vomiting 11/22/2019  . Hypomagnesemia 11/22/2019  . Alcohol abuse   . Delirium due to another medical condition, acute, mixed level of activity 10/23/2018    Past Surgical History:  Procedure Laterality Date  . ESOPHAGOGASTRODUODENOSCOPY N/A 08/26/2020   Procedure: ESOPHAGOGASTRODUODENOSCOPY (EGD);  Surgeon: Jonathon Bellows, MD;  Location: [facility] ENDOSCOPY;  Service: Gastroenterology;  Laterality: N/A;  . ESOPHAGOGASTRODUODENOSCOPY (EGD) WITH PROPOFOL N/A 11/23/2019   Procedure: ESOPHAGOGASTRODUODENOSCOPY (EGD) WITH PROPOFOL;  Surgeon: [name], MD;  Location: [facility] ENDOSCOPY;  Service: Gastroenterology;  Laterality: N/A;  . perforated gastric ulcer      Prior to Admission medications   Medication Sig Start Date End Date Taking? Authorizing Provider  amLODipine (NORVASC) 5 MG tablet Take 1 tablet (5 mg total) by mouth daily. 08/27/20   Mariel Aloe, MD  lisinopril (ZESTRIL) 20 MG tablet Take 1 tablet (20 mg total) by mouth daily. 07/22/20 10/20/20  [name], MD  metoprolol tartrate (LOPRESSOR) 25 MG tablet Take 1 tablet (25 mg total) by mouth 2 (two) times daily. 08/27/20 11/25/20  Mariel Aloe, MD  omeprazole (PRILOSEC) 40 MG capsule Take 1 capsule (40 mg total) by mouth 2 (two) times daily before a meal. 01/22/20 04/21/20  Vanga, Tally Due, MD  thiamine 100 MG tablet Take 1 tablet (100 mg total) by mouth daily. 08/27/20   Mariel Aloe, MD  vitamin B-12 1000 MCG tablet Take 1 tablet (1,000 mcg total) by mouth daily. 08/28/20   Mariel Aloe, MD    Allergies Patient has no known allergies.  Family History  Problem Relation Age of Onset  . Hypertension Mother   . Hypertension Father     Social History Social History   Tobacco Use  . Smoking status: Current Some Day Smoker    Packs/day: 0.50     Types: Cigarettes  . Smokeless tobacco: Current User    Types: Snuff  Vaping Use  . Vaping Use: Never used  Substance Use Topics  . Alcohol use: Yes    Alcohol/week: 7.0 standard drinks    Types: 7 Cans of beer per week    Comment: one 40 oz. states she drinks everytime some one brings her something   . Drug use: No    Review of Systems  Constitutional: No fever/chills Eyes: No visual changes. ENT: No sore throat. Cardiovascular: Denies chest pain. Respiratory: Denies shortness of breath. Gastrointestinal: Positive for abdominal pain, nausea and vomiting. .  No diarrhea.  No constipation. Genitourinary: Negative for dysuria. Musculoskeletal: Negative for back pain. Skin: Negative for rash. Neurological: Negative for headaches, focal weakness or numbness.  ____________________________________________   PHYSICAL EXAM:  VITAL SIGNS: Vitals:   09/30/20 2156 10/01/20 0147  BP: (!) 172/107 (!) 156/93  Pulse: (!) 105 (!) 107  Resp: 18 18  Temp: 98.5 F (36.9 C) 97.7 F (36.5 C)  SpO2: 100% 99%     Constitutional: Alert and oriented. Well appearing and in no acute distress. Eyes: Conjunctivae are normal. PERRL. EOMI. Head: Atraumatic. Nose: No congestion/rhinnorhea. Mouth/Throat: Mucous membranes are dry.  Oropharynx non-erythematous. Neck: No stridor. No cervical spine tenderness to palpation. Cardiovascular: Tachycardic rate, regular rhythm. Grossly normal heart sounds.  Good peripheral circulation. Respiratory: Normal respiratory effort.  No retractions. Lungs CTAB. Gastrointestinal: Soft , nondistended No CVA tenderness. Diffuse and mild tenderness without peritoneal features.  Primarily tender to the RUQ and suprapubic abdomen. Musculoskeletal: No lower extremity tenderness nor edema.  No joint effusions. No signs of acute trauma. Neurologic:  Normal speech and language. No gross focal neurologic deficits are appreciated. No gait instability noted. Skin:  Skin  is warm, dry and intact. No rash noted. Psychiatric: Mood and affect are normal. Speech and behavior are normal.  ____________________________________________   LABS (all labs ordered are listed, but only abnormal results are displayed)  Labs Reviewed  CBC WITH DIFFERENTIAL/PLATELET - Abnormal; Notable for the following components:      Result Value   MCV 75.5 (*)    MCH 25.2 (*)    All other components within normal limits  BASIC METABOLIC PANEL - Abnormal; Notable for the following components:   Sodium 127 (*)    Potassium 3.4 (*)    Chloride 91 (*)    Glucose, Bld 162 (*)    BUN 27 (*)    Calcium 10.4 (*)    All other components within normal limits  HEPATIC FUNCTION PANEL - Abnormal; Notable for the following components:   Total Protein 9.7 (*)    Total Bilirubin 1.3 (*)    Indirect Bilirubin 1.1 (*)    All other components within normal limits  LIPASE, BLOOD  URINALYSIS, COMPLETE (UACMP) WITH MICROSCOPIC   ____________________________________________  12 Lead EKG  Sinus rhythm, rate of 85 bpm.  Normal axis.  Slightly prolonged PR interval at 220 ms and otherwise normal intervals.  No evidence of acute ischemia ____________________________________________  RADIOLOGY  ED MD interpretation: CT abdomen/pelvis reviewed by me  Official radiology report(s): CT ABDOMEN PELVIS W CONTRAST  Result Date: 10/01/2020 CLINICAL DATA:  Nonlocalized intermittent abdominal pain, right upper quadrant and suprapubic, patient believes she passed a gallstone or ureteral calculus EXAM: CT ABDOMEN AND PELVIS WITH CONTRAST TECHNIQUE: Multidetector CT imaging of the abdomen and pelvis was performed using the standard protocol following bolus administration of intravenous contrast. CONTRAST:  121mL OMNIPAQUE IOHEXOL 300 MG/ML  SOLN COMPARISON:  CT 08/25/2020 FINDINGS: Lower chest: Lung bases are clear. Normal heart size. No pericardial effusion. Coronary artery calcifications. Hepatobiliary:  Normal hepatic attenuation. Smooth surface contour. No concerning focal liver lesion. Intra extrahepatic biliary ductal dilatation. Gallbladder is partially decompressed at the time of exam. No pericholecystic fluid or inflammation or visible gallstones in the gallbladder lumen or biliary tree. Pancreas: Mild pancreatic atrophy. No pancreatic ductal dilatation or surrounding inflammatory changes. Spleen: Normal in size. No concerning splenic lesions. Adrenals/Urinary Tract: Normal adrenal glands. Kidneys are normally located with symmetric enhancement and excretion. No suspicious renal lesion, urolithiasis or hydronephrosis. Mild bladder wall thickening is possibly related to underdistention. No visible bladder calculi or debris. Stomach/Bowel: Circumferentially thickened distal thoracic esophagus without para esophageal free fluid or gas. Stomach and duodenum have a more normal appearance. Normal sweep of the duodenum across the midline abdomen. No small bowel thickening or dilatation. Normal appearing appendix in the right lower quadrant without inflammation to suggest acute appendicitis. No colonic dilatation or wall thickening. Scattered colonic diverticula without focal inflammation to suggest diverticulitis. No evidence of bowel obstruction. Vascular/Lymphatic: Extensive atherosclerotic calcifications of the abdominal aorta and branch vessels as well as within both inferior epigastric arteries. No suspicious or enlarged lymph nodes in the included lymphatic chains. Reproductive: Anteverted uterus. Extensive parametrial calcifications are typically a benign incidental finding. Similar to prior. No concerning adnexal lesion. Other: No abdominopelvic free air or fluid. No bowel containing hernia. Musculoskeletal: Pelvic floor laxity. The osseous structures appear diffusely demineralized which may limit detection of small or nondisplaced fractures. No acute osseous abnormality or suspicious osseous lesion.  Multilevel degenerative changes are present in the imaged portions of the spine. Features most pronounced at L4-5 L5-S1 where there is grade 1 anterolisthesis at both levels, global disc bulges, endplate spurring and facet arthropathy resulting moderate to severe canal stenoses and foraminal narrowing at these levels. IMPRESSION: 1. Circumferential thickening of the distal thoracic esophagus, recommend assessment for clinical features of esophagitis and consideration of endoscopy as patient is able to tolerate. 2. Intra and extrahepatic biliary ductal dilatation without visible intraductal gallstone. Radiolucent gallstone or recent passage of a gallstone could have a similar appearance. Consider evaluation with serologies and if abnormal, MRCP could be obtained. This recommendation follows ACR consensus guidelines: White Paper of the ACR Incidental Findings Committee II on Gallbladder and Biliary Findings. J Am Coll Radiol 2013:;10:953-956. 3. Mild circumferential thickening of the urinary bladder, possibly related to underdistention though could correlate with urinalysis to exclude cystitis. No obstructive uropathy or urolithiasis. 4. Colonic diverticulosis without evidence of acute diverticulitis. 5. Extensive vascular calcifications ( Aortic Atherosclerosis (ICD10-I70.0).) 6. Pelvic floor laxity. Electronically Signed   By: Lovena Le M.D.   On: 10/01/2020 04:07    ____________________________________________   PROCEDURES and INTERVENTIONS  Procedure(s) performed (including Critical Care):  .1-3 Lead EKG Interpretation Performed by: Vladimir Crofts, MD Authorized by: Vladimir Crofts, MD     Interpretation: normal     ECG rate:  84   ECG rate assessment: normal  Rhythm: sinus rhythm     Ectopy: none     Conduction: normal   Ultrasound ED Peripheral IV (Provider)  Date/Time: 10/01/2020 4:14 AM Performed by: Vladimir Crofts, MD Authorized by: Vladimir Crofts, MD   Procedure details:    Indications:  multiple failed IV attempts     Skin Prep: chlorhexidine gluconate     Location: left basilic v.   Angiocath:  20 G   Bedside Ultrasound Guided: Yes     Images: not archived     Patient tolerated procedure without complications: Yes     Dressing applied: Yes      Medications  lactated ringers bolus 1,000 mL (0 mLs Intravenous Stopped 10/01/20 0406)  ondansetron (ZOFRAN) injection 4 mg (4 mg Intravenous Given 10/01/20 0245)  iohexol (OMNIPAQUE) 300 MG/ML solution 100 mL (100 mLs Intravenous Contrast Given 10/01/20 0311)    ____________________________________________   MDM / ED COURSE   65 year old woman presents to the ED with RUQ abdominal pain and emesis requiring rule out of choledocholithiasis.  Blood work with slight elevation of bilirubin, and mild electrolyte derangements for which she received a liter of LR.  Exam primarily with epigastric and RUQ tenderness to palpation without peritoneal features.  Does have lower abdominal tenderness as well, into the diffuse nature of her pain in her emesis, CT imaging obtained and demonstrates some signs of biliary ductal dilation but no clear stones or signs of cholecystitis.  RUQ ultrasound is similar with evidence of ductal dilation but no stones.  Due to her continued symptoms and tenderness, elevated bilirubin on blood work, we will pursue MRCP to evaluate for choledocholithiasis.  Patient signed out to oncoming provider to follow-up on the study.  If this is negative anticipate she be suitable for outpatient management  Clinical Course as of 10/01/20 0624  Thu Oct 01, 2020  0243 USIV placed by me [DS]  0411 Patient refusing to provide a urine sample [DS]  7673 Went to reassess. Pt in U/S [DS]  0552 Educated patient of imaging results and my recommendation for MRCP to assess for choledocholithiasis.  She is agreeable. [DS]  0612 Headed to MRI [DS]    Clinical Course User Index [DS] Vladimir Crofts, MD     ____________________________________________   FINAL CLINICAL IMPRESSION(S) / ED DIAGNOSES  Final diagnoses:  RUQ abdominal pain     ED Discharge Orders    None       Colten Desroches Tamala Julian   Note:  This document was prepared using Dragon voice recognition software and may include unintentional dictation errors.   Vladimir Crofts, MD 10/01/20 [phone]

## 2020-10-01 NOTE — ED Notes (Signed)
Pt given Water for PO challenge .

## 2020-10-01 NOTE — Discharge Instructions (Signed)
   Her MRI is as below.  Please call the GI doctor to follow-up with a GI doctor.  If she develops worsening vomiting, upper abdominal pain.  Please return to the ER for repeat evaluation. Her urine culture is pending.  If she develops fever she can also return to the ER for repeat evaluation or any other concerns.  But at this time patient states that she has no symptoms and feels much better    IMPRESSION:  1. Mild diffuse intrahepatic and extrahepatic biliary ductal  dilatation. Common bile duct diameter 8 mm. Normal gallbladder with  no cholelithiasis. No choledocholithiasis. Slightly sharp tapering  of the CBD at the level of the ampulla, cannot exclude ampullary  stricture. No discrete biliary or ampullary mass.  2. Nonspecific moderate circumferential wall thickening in the lower  thoracic esophagus, as better seen on CT study. GI consultation  suggested for consideration of endoscopic correlation.

## 2020-10-01 NOTE — ED Notes (Signed)
Pt calm , collective denied pain or sob. Discharge instructions reviewed with pt and daughter .

## 2020-10-01 NOTE — ED Notes (Signed)
Attempted twice for IV.  Notified Dr. Gwenlyn Perking for [place] to be placed by patient with necessary materials to place IV.  Will hang fluids once he is able to place the

## 2020-10-02 LAB — URINE CULTURE: Culture: NO GROWTH

## 2020-10-22 ENCOUNTER — Other Ambulatory Visit: Payer: Self-pay

## 2020-10-22 ENCOUNTER — Inpatient Hospital Stay
Admission: EM | Admit: 2020-10-22 | Discharge: 2020-10-24 | DRG: 689 | Disposition: A | Discharge status: Home / Self Care | Payer: Medicare Other | Attending: Internal Medicine | Admitting: Internal Medicine

## 2020-10-22 ENCOUNTER — Emergency Department: Payer: Medicare Other

## 2020-10-22 DIAGNOSIS — K7290 Hepatic failure, unspecified without coma: Secondary | ICD-10-CM

## 2020-10-22 DIAGNOSIS — R410 Disorientation, unspecified: Secondary | ICD-10-CM

## 2020-10-22 DIAGNOSIS — G9341 Metabolic encephalopathy: Secondary | ICD-10-CM | POA: Diagnosis present

## 2020-10-22 DIAGNOSIS — N390 Urinary tract infection, site not specified: Secondary | ICD-10-CM | POA: Diagnosis not present

## 2020-10-22 DIAGNOSIS — N3000 Acute cystitis without hematuria: Secondary | ICD-10-CM

## 2020-10-22 DIAGNOSIS — Z20822 Contact with and (suspected) exposure to covid-19: Secondary | ICD-10-CM | POA: Diagnosis present

## 2020-10-22 DIAGNOSIS — B961 Klebsiella pneumoniae [K. pneumoniae] as the cause of diseases classified elsewhere: Secondary | ICD-10-CM | POA: Diagnosis present

## 2020-10-22 DIAGNOSIS — E119 Type 2 diabetes mellitus without complications: Secondary | ICD-10-CM | POA: Diagnosis present

## 2020-10-22 DIAGNOSIS — R4182 Altered mental status, unspecified: Principal | ICD-10-CM

## 2020-10-22 DIAGNOSIS — E876 Hypokalemia: Secondary | ICD-10-CM | POA: Diagnosis present

## 2020-10-22 DIAGNOSIS — K2210 Ulcer of esophagus without bleeding: Secondary | ICD-10-CM

## 2020-10-22 DIAGNOSIS — F1010 Alcohol abuse, uncomplicated: Secondary | ICD-10-CM | POA: Diagnosis present

## 2020-10-22 DIAGNOSIS — F17200 Nicotine dependence, unspecified, uncomplicated: Secondary | ICD-10-CM | POA: Diagnosis present

## 2020-10-22 DIAGNOSIS — M109 Gout, unspecified: Secondary | ICD-10-CM | POA: Diagnosis present

## 2020-10-22 DIAGNOSIS — F05 Delirium due to known physiological condition: Secondary | ICD-10-CM | POA: Diagnosis not present

## 2020-10-22 DIAGNOSIS — F1020 Alcohol dependence, uncomplicated: Secondary | ICD-10-CM | POA: Diagnosis present

## 2020-10-22 DIAGNOSIS — F17210 Nicotine dependence, cigarettes, uncomplicated: Secondary | ICD-10-CM | POA: Diagnosis present

## 2020-10-22 DIAGNOSIS — K7682 Hepatic encephalopathy: Secondary | ICD-10-CM

## 2020-10-22 DIAGNOSIS — Z79899 Other long term (current) drug therapy: Secondary | ICD-10-CM

## 2020-10-22 DIAGNOSIS — I10 Essential (primary) hypertension: Secondary | ICD-10-CM

## 2020-10-22 DIAGNOSIS — K222 Esophageal obstruction: Secondary | ICD-10-CM

## 2020-10-22 DIAGNOSIS — Z8249 Family history of ischemic heart disease and other diseases of the circulatory system: Secondary | ICD-10-CM

## 2020-10-22 DIAGNOSIS — E871 Hypo-osmolality and hyponatremia: Secondary | ICD-10-CM | POA: Diagnosis present

## 2020-10-22 LAB — CBC
HCT: 32.1 % — ABNORMAL LOW (ref 36.0–46.0)
Hemoglobin: 10.5 g/dL — ABNORMAL LOW (ref 12.0–15.0)
MCH: 25.3 pg — ABNORMAL LOW (ref 26.0–34.0)
MCHC: 32.7 g/dL (ref 30.0–36.0)
MCV: 77.3 fL — ABNORMAL LOW (ref 80.0–100.0)
Platelets: 245 10*3/uL (ref 150–400)
RBC: 4.15 MIL/uL (ref 3.87–5.11)
RDW: 15.5 % (ref 11.5–15.5)
WBC: 4.6 10*3/uL (ref 4.0–10.5)
nRBC: 0 % (ref 0.0–0.2)

## 2020-10-22 LAB — URINALYSIS, COMPLETE (UACMP) WITH MICROSCOPIC
Bilirubin Urine: NEGATIVE
Glucose, UA: NEGATIVE mg/dL
Hgb urine dipstick: NEGATIVE
Ketones, ur: NEGATIVE mg/dL
Nitrite: NEGATIVE
Protein, ur: NEGATIVE mg/dL
Specific Gravity, Urine: 1.019 (ref 1.005–1.030)
pH: 7 (ref 5.0–8.0)

## 2020-10-22 LAB — CBG MONITORING, ED: Glucose-Capillary: 85 mg/dL (ref 70–99)

## 2020-10-22 LAB — COMPREHENSIVE METABOLIC PANEL
ALT: 10 U/L (ref 0–44)
AST: 34 U/L (ref 15–41)
Albumin: 4.3 g/dL (ref 3.5–5.0)
Alkaline Phosphatase: 70 U/L (ref 38–126)
Anion gap: 10 (ref 5–15)
BUN: 6 mg/dL — ABNORMAL LOW (ref 8–23)
CO2: 26 mmol/L (ref 22–32)
Calcium: 9.5 mg/dL (ref 8.9–10.3)
Chloride: 92 mmol/L — ABNORMAL LOW (ref 98–111)
Creatinine, Ser: 0.6 mg/dL (ref 0.44–1.00)
GFR, Estimated: >60 mL/min (ref >60)
Glucose, Bld: 93 mg/dL (ref 70–99)
Potassium: 4.3 mmol/L (ref 3.5–5.1)
Sodium: 128 mmol/L — ABNORMAL LOW (ref 135–145)
Total Bilirubin: 1 mg/dL (ref 0.3–1.2)
Total Protein: 9.7 g/dL — ABNORMAL HIGH (ref 6.5–8.1)

## 2020-10-22 LAB — RESP PANEL BY RT-PCR (FLU A&B, COVID) ARPGX2
Influenza A by PCR: NEGATIVE
Influenza B by PCR: NEGATIVE
SARS Coronavirus 2 by RT PCR: NEGATIVE

## 2020-10-22 LAB — URINE DRUG SCREEN, QUALITATIVE (ARMC ONLY)
Amphetamines, Ur Screen: NOT DETECTED
Barbiturates, Ur Screen: NOT DETECTED
Benzodiazepine, Ur Scrn: NOT DETECTED
Cannabinoid 50 Ng, Ur ~~LOC~~: NOT DETECTED
Cocaine Metabolite,Ur ~~LOC~~: NOT DETECTED
MDMA (Ecstasy)Ur Screen: NOT DETECTED
Methadone Scn, Ur: NOT DETECTED
Opiate, Ur Screen: NOT DETECTED
Phencyclidine (PCP) Ur S: NOT DETECTED
Tricyclic, Ur Screen: NOT DETECTED

## 2020-10-22 LAB — VITAMIN B12: Vitamin B-12: 247 pg/mL (ref 180–914)

## 2020-10-22 LAB — ETHANOL: Alcohol, Ethyl (B): <10 mg/dL (ref <10)

## 2020-10-22 LAB — AMMONIA: Ammonia: 64 umol/L — ABNORMAL HIGH (ref 9–35)

## 2020-10-22 LAB — TSH: TSH: 3.439 u[IU]/mL (ref 0.350–4.500)

## 2020-10-22 IMAGING — CT CT HEAD W/O CM
3 series · 16 of 45 positions shown, 19 images · non-contrast
Comparison: CT head [DATE]

CLINICAL DATA: Mental status change

EXAM:
CT HEAD WITHOUT CONTRAST
TECHNIQUE: Contiguous axial images were obtained from the base of the skull
through the vertex without intravenous contrast.

[Series 2: head wo · axial · 0.40mm/px · z∈[-156,-41]mm · 10 of 28 slices shown, 13 images]
[im 3/28  brain]
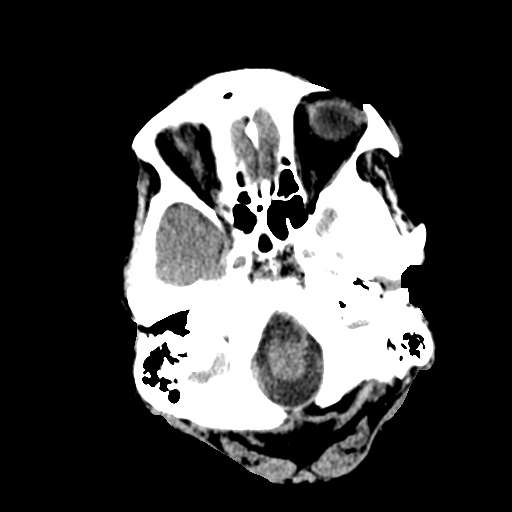
[im 3/28  bone]
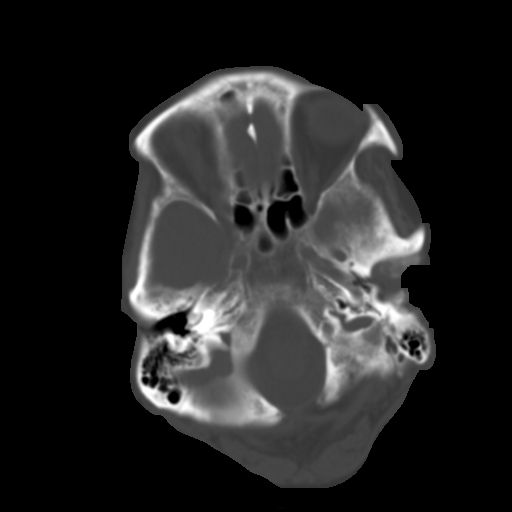
[im 5/28  brain]
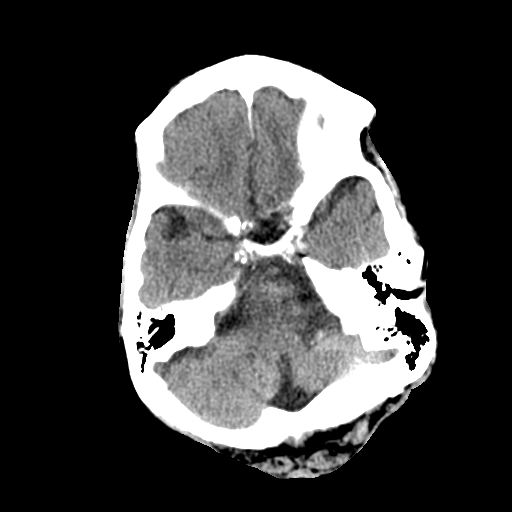
[im 8/28  brain]
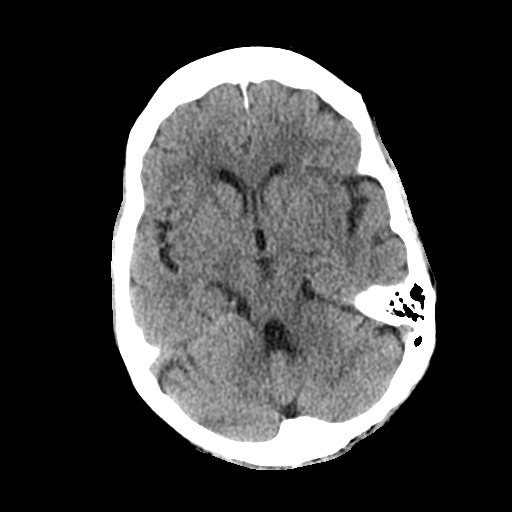
[im 11/28  brain]
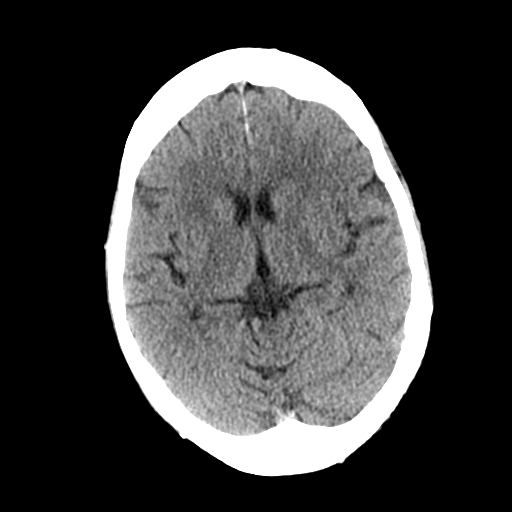
[im 13/28  brain]
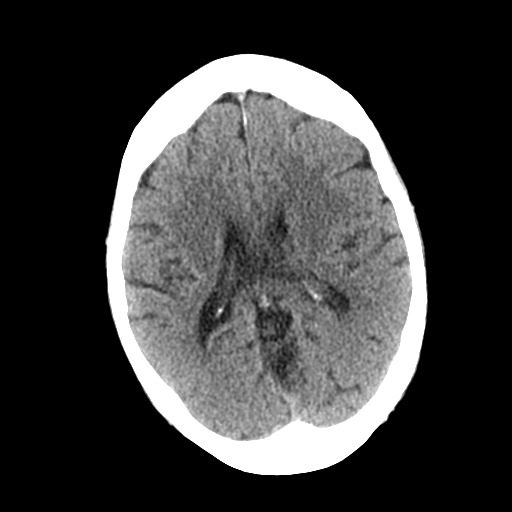
[im 13/28  bone]
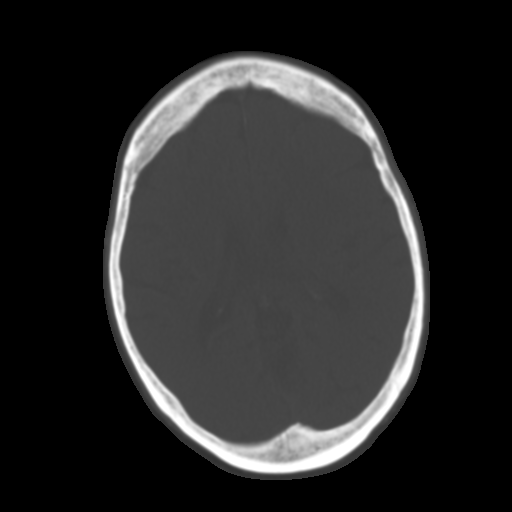
[im 16/28  brain]
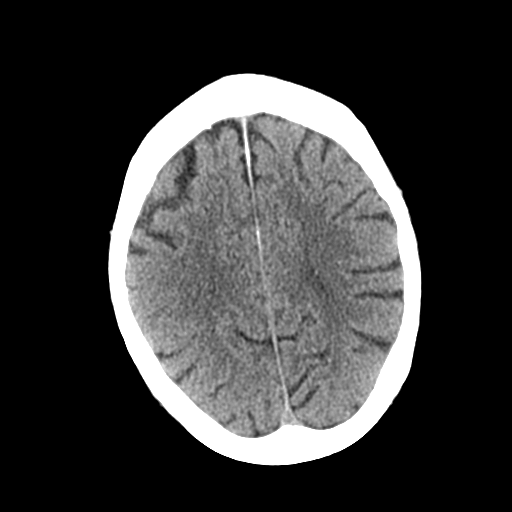
[im 18/28  brain]
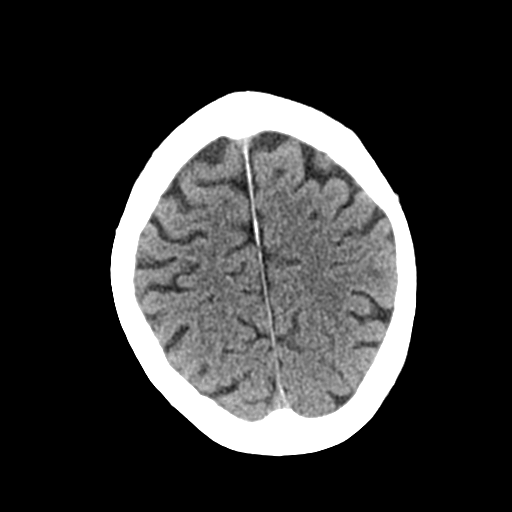
[im 21/28  brain]
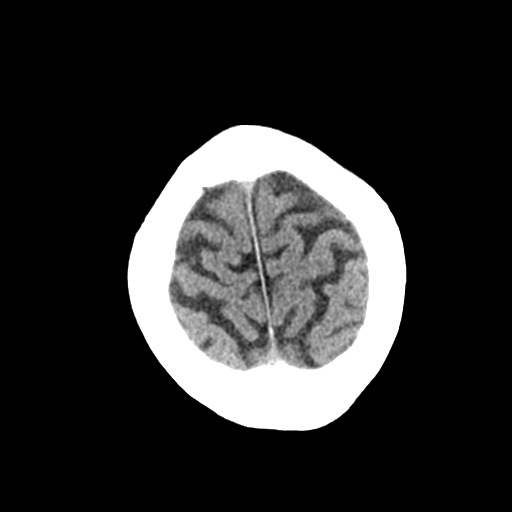
[im 24/28  brain]
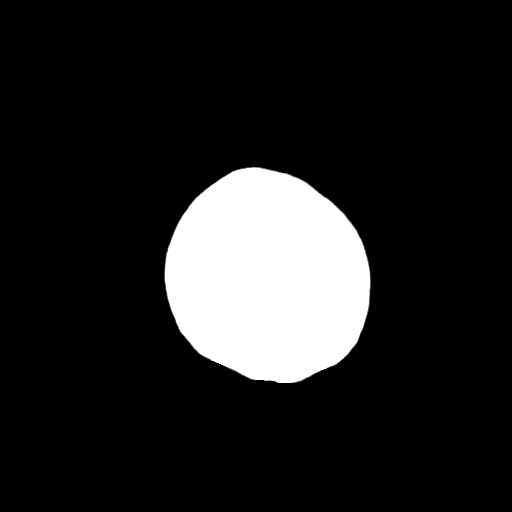
[im 24/28  bone]
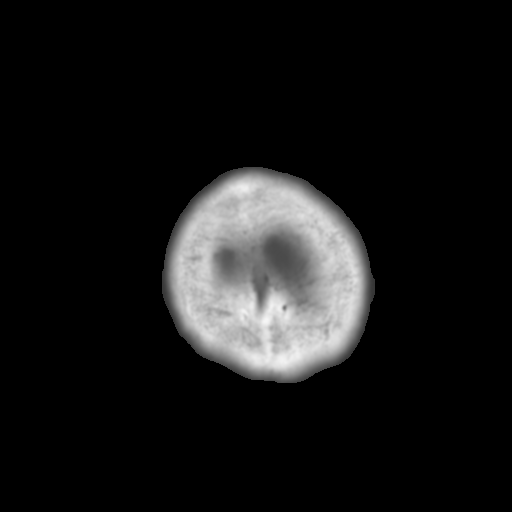
[im 26/28  brain]
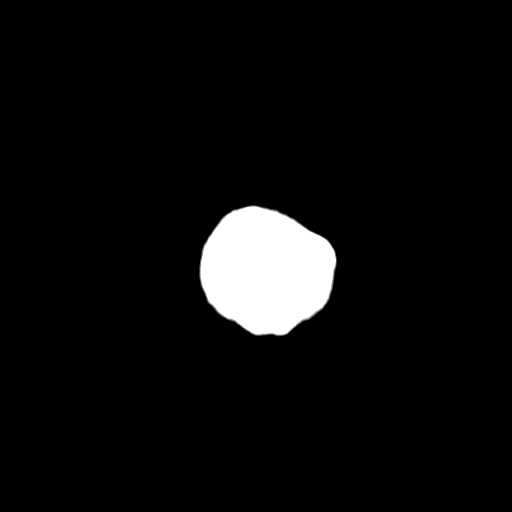

[Series 4: coronal soft tissue · coronal · 0.29mm/px · 3 of 61 slices shown]
[im 21/61  brain]
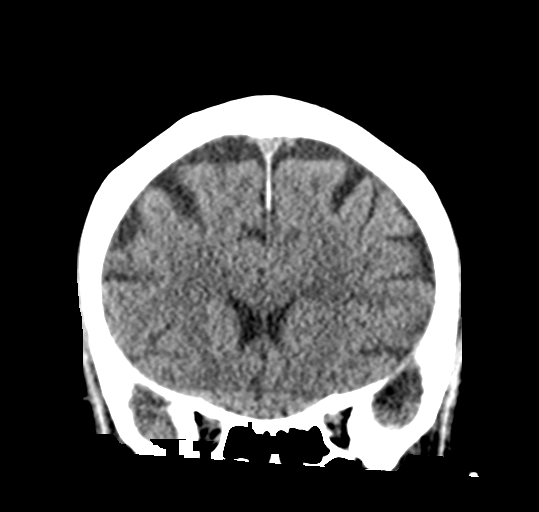
[im 27/61  brain]
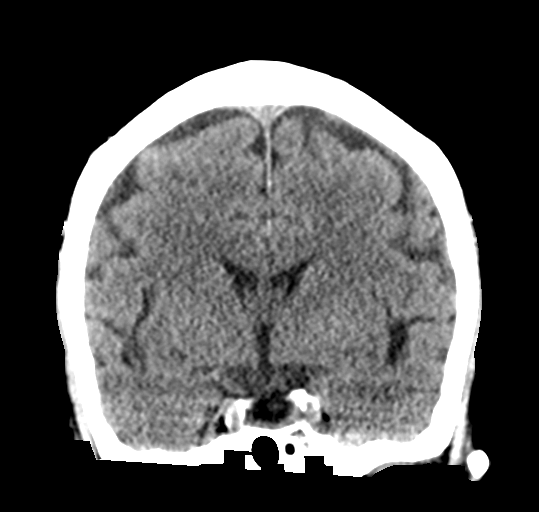
[im 34/61  brain]
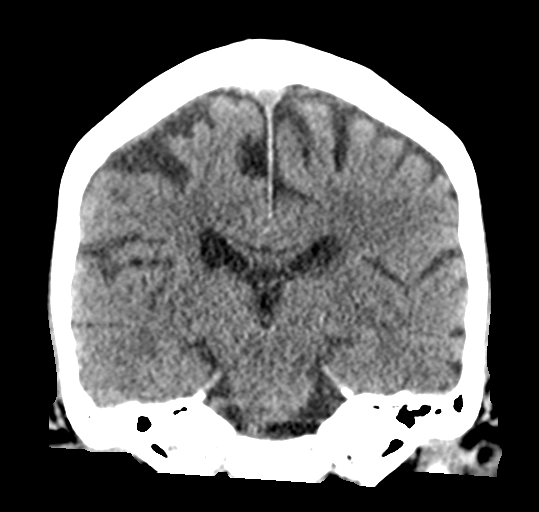

[Series 5: sagittal soft tissue · sagittal · 0.32mm/px · 3 of 47 slices shown]
[im 16/47  brain]
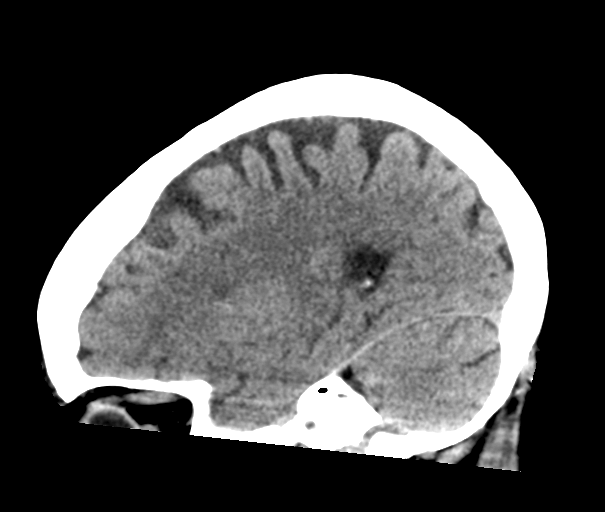
[im 24/47  brain]
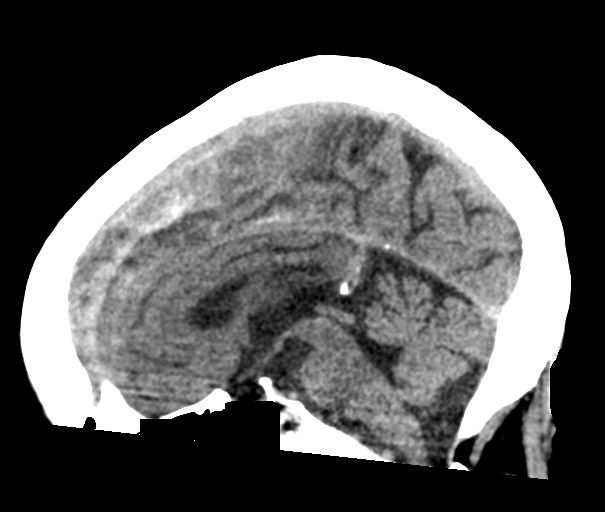
[im 31/47  brain]
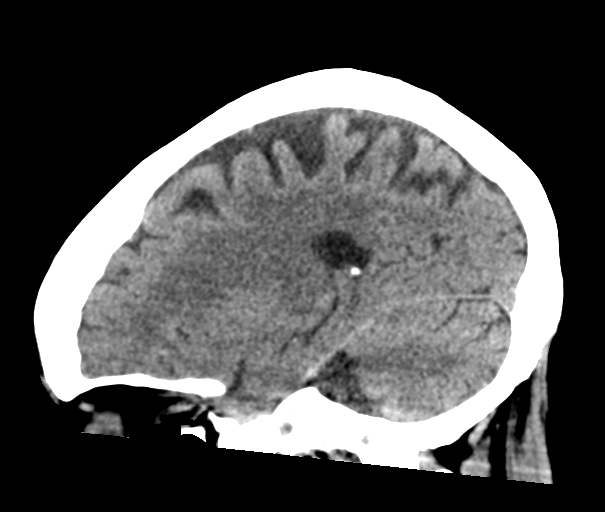

[16 of 45 positions shown; findings below may reference images not displayed]

FINDINGS: Brain: No evidence of acute infarction, hemorrhage, hydrocephalus,
extra-axial collection or mass lesion/mass effect.

Vascular: Negative for hyperdense vessel

Skull: Negative

Sinuses/Orbits: Negative

Other: None
IMPRESSION: Negative CT head

## 2020-10-22 MED ORDER — FOLIC ACID 1 MG PO TABS
1.0000 mg | ORAL_TABLET | Freq: Every day | ORAL | Status: DC
  Administered 2020-10-22 – 2020-10-24 (×3): 1 mg via ORAL
  Filled 2020-10-22 (×3): qty 1

## 2020-10-22 MED ORDER — AMLODIPINE BESYLATE 5 MG PO TABS
5.0000 mg | ORAL_TABLET | Freq: Every day | ORAL | Status: DC
  Administered 2020-10-23 – 2020-10-24 (×2): 5 mg via ORAL
  Filled 2020-10-22 (×2): qty 1

## 2020-10-22 MED ORDER — SODIUM CHLORIDE 0.9 % IV SOLN
1.0000 g | INTRAVENOUS | Status: DC
  Administered 2020-10-22 – 2020-10-23 (×2): 1 g via INTRAVENOUS
  Filled 2020-10-22: qty 10
  Filled 2020-10-22 (×2): qty 1

## 2020-10-22 MED ORDER — LACTULOSE 10 GM/15ML PO SOLN
30.0000 g | Freq: Once | ORAL | Status: AC
  Administered 2020-10-22: 30 g via ORAL
  Filled 2020-10-22: qty 60

## 2020-10-22 MED ORDER — LORAZEPAM 2 MG/ML IJ SOLN: 1.0000 mg | INTRAMUSCULAR | Status: DC | PRN

## 2020-10-22 MED ORDER — HYDRALAZINE HCL 20 MG/ML IJ SOLN: 10.0000 mg | Freq: Four times a day (QID) | INTRAMUSCULAR | Status: DC | PRN

## 2020-10-22 MED ORDER — LACTULOSE 10 GM/15ML PO SOLN
30.0000 g | Freq: Two times a day (BID) | ORAL | Status: DC
  Administered 2020-10-22: 30 g via ORAL
  Filled 2020-10-22: qty 60

## 2020-10-22 MED ORDER — VITAMIN B-12 1000 MCG PO TABS
1000.0000 ug | ORAL_TABLET | Freq: Every day | ORAL | Status: DC
  Administered 2020-10-23 – 2020-10-24 (×2): 1000 ug via ORAL
  Filled 2020-10-22 (×2): qty 1

## 2020-10-22 MED ORDER — PANTOPRAZOLE SODIUM 40 MG PO TBEC
40.0000 mg | DELAYED_RELEASE_TABLET | Freq: Every day | ORAL | Status: DC
  Administered 2020-10-22 – 2020-10-24 (×3): 40 mg via ORAL
  Filled 2020-10-22 (×3): qty 1

## 2020-10-22 MED ORDER — SODIUM CHLORIDE 0.9 % IV BOLUS
1000.0000 mL | Freq: Once | INTRAVENOUS | Status: AC
  Administered 2020-10-22: 1000 mL via INTRAVENOUS

## 2020-10-22 MED ORDER — LISINOPRIL 20 MG PO TABS
20.0000 mg | ORAL_TABLET | Freq: Every day | ORAL | Status: DC
  Administered 2020-10-23 – 2020-10-24 (×2): 20 mg via ORAL
  Filled 2020-10-22 (×2): qty 1

## 2020-10-22 MED ORDER — THIAMINE HCL 100 MG/ML IJ SOLN
500.0000 mg | Freq: Three times a day (TID) | INTRAVENOUS | Status: AC
  Administered 2020-10-22 (×2): 500 mg via INTRAVENOUS
  Filled 2020-10-22 (×2): qty 5

## 2020-10-22 MED ORDER — ADULT MULTIVITAMIN W/MINERALS CH
1.0000 | ORAL_TABLET | Freq: Every day | ORAL | Status: DC
  Administered 2020-10-22 – 2020-10-24 (×3): 1 via ORAL
  Filled 2020-10-22 (×3): qty 1

## 2020-10-22 MED ORDER — LORAZEPAM 1 MG PO TABS: 1.0000 mg | ORAL_TABLET | ORAL | Status: DC | PRN

## 2020-10-22 MED ORDER — SODIUM CHLORIDE 0.9 % IV SOLN: INTRAVENOUS | Status: DC

## 2020-10-22 MED ORDER — POLYETHYLENE GLYCOL 3350 17 G PO PACK: 17.0000 g | PACK | Freq: Every day | ORAL | Status: DC | PRN

## 2020-10-22 MED ORDER — ALBUTEROL SULFATE (2.5 MG/3ML) 0.083% IN NEBU: 2.5000 mg | INHALATION_SOLUTION | Freq: Four times a day (QID) | RESPIRATORY_TRACT | Status: DC | PRN

## 2020-10-22 MED ORDER — ENOXAPARIN SODIUM 40 MG/0.4ML IJ SOSY
40.0000 mg | PREFILLED_SYRINGE | INTRAMUSCULAR | Status: DC
  Administered 2020-10-22 – 2020-10-23 (×2): 40 mg via SUBCUTANEOUS
  Filled 2020-10-22 (×2): qty 0.4

## 2020-10-22 MED ORDER — ONDANSETRON HCL 4 MG/2ML IJ SOLN: 4.0000 mg | Freq: Four times a day (QID) | INTRAMUSCULAR | Status: DC | PRN

## 2020-10-22 MED ORDER — NICOTINE 14 MG/24HR TD PT24
14.0000 mg | MEDICATED_PATCH | Freq: Every day | TRANSDERMAL | Status: DC
  Administered 2020-10-22 – 2020-10-24 (×3): 14 mg via TRANSDERMAL
  Filled 2020-10-22 (×3): qty 1

## 2020-10-22 MED ORDER — SODIUM CHLORIDE 0.9 % IV SOLN
1.0000 g | Freq: Once | INTRAVENOUS | Status: AC
  Administered 2020-10-22: 1 g via INTRAVENOUS
  Filled 2020-10-22: qty 10

## 2020-10-22 MED ORDER — ACETAMINOPHEN 325 MG PO TABS
650.0000 mg | ORAL_TABLET | Freq: Four times a day (QID) | ORAL | Status: DC | PRN
Start: 2020-10-22 — End: 2020-10-24

## 2020-10-22 MED ORDER — METOPROLOL TARTRATE 25 MG PO TABS
25.0000 mg | ORAL_TABLET | Freq: Two times a day (BID) | ORAL | Status: DC
  Administered 2020-10-22 – 2020-10-24 (×4): 25 mg via ORAL
  Filled 2020-10-22 (×4): qty 1

## 2020-10-22 MED ORDER — ONDANSETRON HCL 4 MG PO TABS: 4.0000 mg | ORAL_TABLET | Freq: Four times a day (QID) | ORAL | Status: DC | PRN

## 2020-10-22 MED ORDER — ACETAMINOPHEN 650 MG RE SUPP: 650.0000 mg | Freq: Four times a day (QID) | RECTAL | Status: DC | PRN

## 2020-10-22 NOTE — ED Triage Notes (Signed)
Pt to ED with daughter POV for altered mental status x 1 week.  Daughter reports pt has been hallucinating, wandering off, stealing stuff in stores.  Pt speaking incomprehensible sentences in triage.  Oriented to time but not to situation or place.  No recent falls but unable to get questions answered correctly by pt.

## 2020-10-22 NOTE — ED Notes (Signed)
Provided [place] sandwich tray, strawberry ice cream, graham crackers, water.

## 2020-10-22 NOTE — ED Provider Notes (Signed)
[facility] Emergency Department Provider Note  Time seen: 9:02 AM  I have reviewed the triage vital signs and the nursing notes.   HISTORY  Chief Complaint Altered Mental Status   HPI Donna Aguirre is a 65 y.o. female with a past medical history of diabetes, hypertension, presents to the emergency department for altered mental status.  According to the daughter for the past 1 week the patient has been confused, making up stories and wandering out of the house.  She states this is a very big change from the patient's baseline.  Denies any recent fever cough congestion dysuria.  Denies any recent illnesses.  Daughter states the patient does drink alcohol, beer most days of the week but has not been doing so recently.  Denies any other substances.  Denies any medication changes.  Past Medical History:  Diagnosis Date  . Abdominal pain 11/22/2019  . Arthritis   . Diabetes mellitus without complication ([place])   . Gout   . Hypertension   . Hypokalemia 11/22/2019  . Hypomagnesemia 11/22/2019  . Hyponatremia 11/16/2018    Patient Active Problem List   Diagnosis Date Noted  . Hypertensive urgency 08/27/2020  . Encephalopathy 08/25/2020  . Electrolyte abnormality   . Gastroenteritis due to COVID-19 virus 07/20/2020  . Hypokalemia 07/19/2020  . COVID-19 virus detected 07/19/2020  . Nicotine dependence 07/19/2020  . E-coli UTI   . Tachycardia   . Esophageal stricture 11/23/2019  . Dysphagia   . Esophagitis   . Alcohol use disorder, moderate, dependence ([place]) 11/22/2019  . Elevated troponin 11/22/2019  . Pancytopenia ([place]) 11/22/2019  . HTN (hypertension) 11/22/2019  . History of esophageal stricture on EGD 12/2018 [facility] 11/22/2019  . PUD (peptic ulcer disease), with hx of perforated gastric ulcer 11/22/2019  . Vomiting 11/22/2019  . Hypomagnesemia 11/22/2019  . Alcohol abuse   . Delirium due to another medical condition, acute, mixed level of activity  10/23/2018    Past Surgical History:  Procedure Laterality Date  . ESOPHAGOGASTRODUODENOSCOPY N/A 08/26/2020   Procedure: ESOPHAGOGASTRODUODENOSCOPY (EGD);  Surgeon: Jonathon Bellows, MD;  Location: [facility] ENDOSCOPY;  Service: Gastroenterology;  Laterality: N/A;  . ESOPHAGOGASTRODUODENOSCOPY (EGD) WITH PROPOFOL N/A 11/23/2019   Procedure: ESOPHAGOGASTRODUODENOSCOPY (EGD) WITH PROPOFOL;  Surgeon: [name], MD;  Location: [facility] ENDOSCOPY;  Service: Gastroenterology;  Laterality: N/A;  . perforated gastric ulcer      Prior to Admission medications   Medication Sig Start Date End Date Taking? Authorizing Provider  amLODipine (NORVASC) 5 MG tablet Take 1 tablet (5 mg total) by mouth daily. 08/27/20   Mariel Aloe, MD  lisinopril (ZESTRIL) 20 MG tablet Take 1 tablet (20 mg total) by mouth daily. 07/22/20 10/20/20  [name], MD  metoprolol tartrate (LOPRESSOR) 25 MG tablet Take 1 tablet (25 mg total) by mouth 2 (two) times daily. 08/27/20 11/25/20  Mariel Aloe, MD  omeprazole (PRILOSEC) 40 MG capsule Take 1 capsule (40 mg total) by mouth 2 (two) times daily before a meal. 01/22/20 04/21/20  Vanga, Tally Due, MD  thiamine 100 MG tablet Take 1 tablet (100 mg total) by mouth daily. 08/27/20   Mariel Aloe, MD  vitamin B-12 1000 MCG tablet Take 1 tablet (1,000 mcg total) by mouth daily. 08/28/20   Mariel Aloe, MD    No Known Allergies  Family History  Problem Relation Age of Onset  . Hypertension Mother   . Hypertension Father     Social History Social History   Tobacco  Use  . Smoking status: Current Some Day Smoker    Packs/day: 0.50    Types: Cigarettes  . Smokeless tobacco: Current User    Types: Snuff  Vaping Use  . Vaping Use: Never used  Substance Use Topics  . Alcohol use: Yes    Alcohol/week: 7.0 standard drinks    Types: 7 Cans of beer per week    Comment: one 40 oz. states she drinks everytime some one brings her something   . Drug use: No    Review of  Systems Constitutional: Negative for fever. Cardiovascular: Negative for chest pain. Respiratory: Negative for shortness of breath. Gastrointestinal: Negative for abdominal pain, vomiting and diarrhea. Genitourinary: Negative for urinary compaints Musculoskeletal: Negative for musculoskeletal complaints Skin: Negative for skin complaints  Neurological: Negative for headache All other ROS negative  ____________________________________________   PHYSICAL EXAM:  VITAL SIGNS: ED Triage Vitals  Enc Vitals Group     BP 10/22/20 0848 140/71     Pulse Rate 10/22/20 0846 74     Resp 10/22/20 0846 18     Temp 10/22/20 0846 (!) 97.5 F (36.4 C)     Temp Source 10/22/20 0846 Oral     SpO2 10/22/20 0846 100 %     Weight 10/22/20 0847 146 lb (66.2 kg)     Height 10/22/20 0847 5\' 4"  (1.626 m)     Head Circumference --      Peak Flow --      Pain Score 10/22/20 0847 0     Pain Loc --      Pain Edu? --      Excl. in [place]? --     Constitutional: Patient is awake and alert.  She is oriented to person and time, but states she was in the clinic instead of the ER. Eyes: Normal exam ENT      Head: Normocephalic and atraumatic.      Mouth/Throat: Mucous membranes are moist. Cardiovascular: Normal rate, regular rhythm.  Respiratory: Normal respiratory effort without tachypnea nor retractions. Breath sounds are clear  Gastrointestinal: Soft and nontender. No distention. Musculoskeletal: Nontender with normal range of motion in all extremities.  Neurologic:  Normal speech and language. No gross focal neurologic deficits Skin:  Skin is warm, dry and intact.  Psychiatric: Mood and affect are normal.   ____________________________________________   RADIOLOGY  CT scan negative for acute abnormality  ____________________________________________   INITIAL IMPRESSION / ASSESSMENT AND PLAN / ED COURSE  Pertinent labs & imaging results that were available during my care of the patient were  reviewed by me and considered in my medical decision making (see chart for details).   Patient presents emergency department for altered mental status/delirium.  Currently the patient is awake and alert.  Daughter states she has been making things up such as she won a large sum of money, which is an accurate.  Daughter states she has been wandering out of the house without telling anybody which is atypical.  Overall the patient appears well during my examination she did states she was at [facility] when in fact she is in the emergency department.  We will check labs and obtain a CT scan of the head.  We will continue to closely monitor and look for a reason for her acute delirium.  Patient does drink alcohol on a daily basis but daughter states she has not been drinking recently.  Differential would include metabolic or electrolyte abnormality, infectious etiology, confabulation possibly related to alcohol/Warnicke's.  We will IV hydrate while awaiting work-up.  Patient's ammonia level is elevated concerning for possible hepatic encephalopathy.  Urinalysis also shows urinary tract infection.  We will treat with IV Rocephin and send urine culture treat with lactulose and admit to the hospitalist service.  Patient initially reluctant to admission however after long discussion with the patient and her daughter patient is agreeable to admission for further work-up and treatment.  DANIALLE DEMENT was evaluated in Emergency Department on 10/22/2020 for the symptoms described in the history of present illness. She was evaluated in the context of the global COVID-19 pandemic, which necessitated consideration that the patient might be at risk for infection with the SARS-CoV-2 virus that causes COVID-19. Institutional protocols and algorithms that pertain to the evaluation of patients at risk for COVID-19 are in a state of rapid change based on information released by regulatory bodies including the CDC and  federal and state organizations. These policies and algorithms were followed during the patient's care in the ED.  ____________________________________________   FINAL CLINICAL IMPRESSION(S) / ED DIAGNOSES  Altered mental status Hepatic encephalopathy   Harvest Dark, MD 10/22/20 1243

## 2020-10-22 NOTE — ED Notes (Signed)
EDP at bedside. Pt denies that she is confused. Daughter states that pt is confused, increasingly over past week. Pt not answering orientation questions correctly.

## 2020-10-22 NOTE — ED Notes (Signed)
Ice cream given to pt.

## 2020-10-22 NOTE — ED Notes (Signed)
Reina RN aware of assigned bed 

## 2020-10-22 NOTE — H&P (Addendum)
[facility] History and Physical  Donna Aguirre GMW:[id number] DOB: 02/21/1956 DOA: 10/22/2020  Referring physician: ED  [facility]P: [facility] [place]   Patient is coming from: Home  Chief Complaint: Altered mental status  HPI: Donna Aguirre is a 65 y.o. female with past medical history of hypertension, diabetes mellitus, alcohol and cigarette dependence presented to hospital with altered mental status for 1 week.  Patient's daughter reported patient was confused and wandering around and stating that she did have a lot of money and she did not have to work anymore and and so on which did not make any sense.  Patient does have history of ingestion of beer but has not consumed in the last few days due to confusion.  There is no mention of fever, chills or rigor.  Patient states that she has been urinating more than usual.  Denies any nausea, vomiting, fever or chills.  Denies any shortness of breath, chest pain, palpitation.  There is no mention of dizziness syncope.  No diarrhea or constipation.  ED Course: In the ED, patient was given 1 L of normal saline, lactulose and Rocephin IV.  Urine culture was sent in and patient was considered for admission to the hospital.  Urine drug screen was negative. COVID and influenza was negative.  Alcohol level was less than 10.  Ammonia level was elevated at 64.  UA showed 11-20 white cells with many bacteria.  There was no leukocytosis but sodium was low at 128.  LFTs were within normal range.  CAT scan head did not show any acute abnormality  Review of Systems:  All systems were reviewed and were negative unless otherwise mentioned in the HPI  Past Medical History:  Diagnosis Date  . Abdominal pain 11/22/2019  . Arthritis   . Diabetes mellitus without complication ([place])   . Gout   . Hypertension   . Hypokalemia 11/22/2019  . Hypomagnesemia 11/22/2019  . Hyponatremia 11/16/2018   Past Surgical History:  Procedure Laterality Date   . ESOPHAGOGASTRODUODENOSCOPY N/A 08/26/2020   Procedure: ESOPHAGOGASTRODUODENOSCOPY (EGD);  Surgeon: Jonathon Bellows, MD;  [facility] END[facility]COPY;  Service: Gastroenterology;  Laterality: N/A;  . ESOPHAGOGASTRODUODENOSCOPY (EGD) WITH PROPOFOL N/A 11/23/2019   Procedure: ESOPHAGOGASTRODUODENOSCOPY (EGD) WITH PROPOFOL;  Surgeon: [name], MD;  Location: [facility] ENDOSCOPY;  Service: Gastroenterology;  Laterality: N/A;  . perforated gastric ulcer      Social History:  reports that she has been smoking cigarettes. She has been smoking about 0.50 packs per day. Her smokeless tobacco use includes snuff. She reports current alcohol use of about 7.0 standard drinks of alcohol per week. She reports that she does not use drugs.  No Known Allergies  Family History  Problem Relation Age of Onset  . Hypertension Mother   . Hypertension Father      Prior to Admission medications   Medication Sig Start Date End Date Taking? Authorizing Provider  amLODipine (NORVASC) 5 MG tablet Take 1 tablet (5 mg total) by mouth daily. 08/27/20   Mariel Aloe, MD  lisinopril (ZESTRIL) 20 MG tablet Take 1 tablet (20 mg total) by mouth daily. 07/22/20 10/20/20  [name], MD  metoprolol tartrate (LOPRESSOR) 25 MG tablet Take 1 tablet (25 mg total) by mouth 2 (two) times daily. 08/27/20 11/25/20  Mariel Aloe, MD  omeprazole (PRILOSEC) 40 MG capsule Take 1 capsule (40 mg total) by mouth 2 (two) times daily before a meal. 01/22/20 04/21/20  [name], MD  thiamine  100 MG tablet Take 1 tablet (100 mg total) by mouth daily. 08/27/20   Mariel Aloe, MD  vitamin B-12 1000 MCG tablet Take 1 tablet (1,000 mcg total) by mouth daily. 08/28/20   Mariel Aloe, MD    Physical Exam: Vitals:   10/22/20 0847 10/22/20 0848 10/22/20 1011 10/22/20 1130  BP:  140/71 (!) 146/78 (!) 129/106  Pulse:   77 74  Resp:   16 16  Temp:      TempSrc:      SpO2:   99% 100%  Weight: 66.2 kg     Height: 5\' 4"  (1.626 m)       Wt Readings from Last 3 Encounters:  10/22/20 66.2 kg  09/30/20 65.8 kg  08/24/20 66.2 kg   Body mass index is 25.06 kg/m.  General:  Average built, not in obvious distress, communicative HENT: Normocephalic, pupils equally reacting to light and accommodation.  No scleral pallor or icterus noted. Oral mucosa is moist.  Chest:  Clear breath sounds.  Diminished breath sounds bilaterally. No crackles or wheezes.  CVS: S1 &S2 heard. No murmur.  Regular rate and rhythm. Abdomen: Soft, nontender, nondistended.  Bowel sounds are heard.  Liver is not palpable, no abdominal mass palpated Extremities: No cyanosis, clubbing or edema.  Peripheral pulses are palpable. Psych: Alert, awake and confused, disoriented. CNS:  No cranial nerve deficits.  Power equal in all extremities.   No cerebellar signs.   Skin: Warm and dry.  No rashes noted.  Labs on Admission:   CBC: Recent Labs  Lab 10/22/20 1013  WBC 4.6  HGB 10.5*  HCT 32.1*  MCV 77.3*  PLT 510    Basic Metabolic Panel: Recent Labs  Lab 10/22/20 0858  NA 128*  K 4.3  CL 92*  CO2 26  GLUCOSE 93  BUN 6*  CREATININE 0.60  CALCIUM 9.5    Liver Function Tests: Recent Labs  Lab 10/22/20 0858  AST 34  ALT 10  ALKPHOS 70  BILITOT 1.0  PROT 9.7*  ALBUMIN 4.3   No results for input(s): LIPASE, AMYLASE in the last 168 hours. Recent Labs  Lab 10/22/20 1013  AMMONIA 64*    Cardiac Enzymes: No results for input(s): CKTOTAL, CKMB, CKMBINDEX, TROPONINI in the last 168 hours.  BNP (last 3 results) No results for input(s): BNP in the last 8760 hours.  ProBNP (last 3 results) No results for input(s): PROBNP in the last 8760 hours.  CBG: Recent Labs  Lab 10/22/20 0857  GLUCAP 85    Lipase     Component Value Date/Time   LIPASE 24 09/30/2020 2158   LIPASE 72 (L) 08/15/2013 0911     Urinalysis    Component Value Date/Time   COLORURINE YELLOW (A) 10/22/2020 0858   APPEARANCEUR HAZY (A) 10/22/2020 0858    LABSPEC 1.019 10/22/2020 0858   PHURINE 7.0 10/22/2020 [place] 10/22/2020 0858   HGBUR NEGATIVE 10/22/2020 [place] 10/22/2020 [place] 10/22/2020 0858   PROTEINUR NEGATIVE 10/22/2020 0858   NITRITE NEGATIVE 10/22/2020 0858   LEUKOCYTESUR SMALL (A) 10/22/2020 0858     Drugs of Abuse     Component Value Date/Time   LABOPIA NONE DETECTED 10/22/2020 0858   COCAINSCRNUR NONE DETECTED 10/22/2020 0858   LABBENZ NONE DETECTED 10/22/2020 0858   AMPHETMU NONE DETECTED 10/22/2020 0858   THCU NONE DETECTED 10/22/2020 [place] DETECTED 10/22/2020 2585  Radiological Exams on Admission: CT Head Wo Contrast  Result Date: 10/22/2020 CLINICAL DATA:  Mental status change EXAM: CT HEAD WITHOUT CONTRAST TECHNIQUE: Contiguous axial images were obtained from the base of the skull through the vertex without intravenous contrast. COMPARISON:  CT head 08/25/2020 FINDINGS: Brain: No evidence of acute infarction, hemorrhage, hydrocephalus, extra-axial collection or mass lesion/mass effect. Vascular: Negative for hyperdense vessel Skull: Negative Sinuses/Orbits: Negative Other: None IMPRESSION: Negative CT head Electronically Signed   By: Franchot Gallo M.D.   On: 10/22/2020 09:45    EKG: Not available for review.  Assessment/Plan Principal Problem:   Altered mental status Active Problems:   Alcohol abuse   Delirium due to another medical condition, acute, mixed level of activity   Nicotine dependence   Diabetes mellitus without complication ([place])   Hypertension   Mental status confusion disorientation likely delirium, confabulation.  Patient has history of chronic alcohol abuse.  Abnormal urine, we will continue treatment with IV Rocephin for possible UTI.Marland Kitchen  Continue IV hydration.  Lactulose given in the ED we will continue with that.  LFT are within normal limits.  Continue CIWA protocol, closely monitor.  We will initiate high-dose  IV thiamine.  Continue folic acid supplements.  Closely monitor.  CT head scan was negative for any acute findings.  Urine drug screen was negative.  We will check TSH, vitamin B1 vitamin B12.  Continue oral vitamin B12 from home.  Chronic alcohol dependence.  Continue CIWA protocol.  Mild hyponatremia.  Could be secondary to chronic beer ingestion.  Continue with hydration.  Check BMP in AM.  Possible UTI.  We will continue treatment with IV Rocephin.  Check urine for culture and sensitivity.   History of diabetes mellitus type 2.  Check A1c.  Not on any medications.  Continue diabetic diet, closely monitor.  Hypertension.  On amlodipine and lisinopril and metoprolol at home.  Will resume.  We will closely monitor blood pressure.  Nicotine dependence.  We will put the patient on nicotine patch while in hospital.  DVT Prophylaxis: Lovenox subcu  Consultant: None  Code Status: Full code  Microbiology urine culture  Antibiotics: Rocephin  Family Communication:  Patients' condition and plan of care including tests being ordered have been discussed with the patient but was unable to reach the patient's daughter on the phone.  Status is: Observation  The patient remains OBS appropriate and will d/c before 2 midnights.  Dispo: The patient is from: Home              Anticipated d/c is to: Home                Severity of Illness: The appropriate patient status for this patient is OBSERVATION. Observation status is judged to be reasonable and necessary in order to provide the required intensity of service to ensure the patient's safety. The patient's presenting symptoms, physical exam findings, and initial radiographic and laboratory data in the context of their medical condition is felt to place them at decreased risk for further clinical deterioration. Furthermore, it is anticipated that the patient will be medically stable for discharge from the hospital within 2 midnights of admission.     Signed, Flora Lipps, MD [facility] 10/22/2020

## 2020-10-22 NOTE — ED Notes (Signed)
Lab called. 2 of blood tubes drawn were hemolyzed. Explained that pt blood was coming very slowly, drop by drop and that it took some time to obtain blood samples. They will come redraw.

## 2020-10-22 NOTE — ED Notes (Signed)
Lab at bedside to redraw 2 tubes that were hemolyzed.

## 2020-10-22 NOTE — ED Notes (Signed)
CBG 85

## 2020-10-22 NOTE — ED Notes (Signed)
Pt to CT

## 2020-10-23 DIAGNOSIS — F17210 Nicotine dependence, cigarettes, uncomplicated: Secondary | ICD-10-CM | POA: Diagnosis present

## 2020-10-23 DIAGNOSIS — Z8249 Family history of ischemic heart disease and other diseases of the circulatory system: Secondary | ICD-10-CM | POA: Diagnosis not present

## 2020-10-23 DIAGNOSIS — B961 Klebsiella pneumoniae [K. pneumoniae] as the cause of diseases classified elsewhere: Secondary | ICD-10-CM | POA: Diagnosis present

## 2020-10-23 DIAGNOSIS — N390 Urinary tract infection, site not specified: Secondary | ICD-10-CM | POA: Diagnosis present

## 2020-10-23 DIAGNOSIS — E876 Hypokalemia: Secondary | ICD-10-CM | POA: Diagnosis present

## 2020-10-23 DIAGNOSIS — F1020 Alcohol dependence, uncomplicated: Secondary | ICD-10-CM | POA: Diagnosis present

## 2020-10-23 DIAGNOSIS — F05 Delirium due to known physiological condition: Secondary | ICD-10-CM

## 2020-10-23 DIAGNOSIS — K7290 Hepatic failure, unspecified without coma: Secondary | ICD-10-CM

## 2020-10-23 DIAGNOSIS — I10 Essential (primary) hypertension: Secondary | ICD-10-CM | POA: Diagnosis present

## 2020-10-23 DIAGNOSIS — R4182 Altered mental status, unspecified: Secondary | ICD-10-CM | POA: Diagnosis present

## 2020-10-23 DIAGNOSIS — K7682 Hepatic encephalopathy: Secondary | ICD-10-CM

## 2020-10-23 DIAGNOSIS — Z20822 Contact with and (suspected) exposure to covid-19: Secondary | ICD-10-CM | POA: Diagnosis present

## 2020-10-23 DIAGNOSIS — N3000 Acute cystitis without hematuria: Secondary | ICD-10-CM | POA: Diagnosis not present

## 2020-10-23 DIAGNOSIS — R410 Disorientation, unspecified: Secondary | ICD-10-CM | POA: Diagnosis not present

## 2020-10-23 DIAGNOSIS — F1010 Alcohol abuse, uncomplicated: Secondary | ICD-10-CM

## 2020-10-23 DIAGNOSIS — M109 Gout, unspecified: Secondary | ICD-10-CM | POA: Diagnosis present

## 2020-10-23 DIAGNOSIS — G9341 Metabolic encephalopathy: Secondary | ICD-10-CM

## 2020-10-23 DIAGNOSIS — E119 Type 2 diabetes mellitus without complications: Secondary | ICD-10-CM | POA: Diagnosis present

## 2020-10-23 DIAGNOSIS — E871 Hypo-osmolality and hyponatremia: Secondary | ICD-10-CM | POA: Diagnosis present

## 2020-10-23 DIAGNOSIS — Z79899 Other long term (current) drug therapy: Secondary | ICD-10-CM | POA: Diagnosis not present

## 2020-10-23 LAB — COMPREHENSIVE METABOLIC PANEL
ALT: 7 U/L (ref 0–44)
AST: 15 U/L (ref 15–41)
Albumin: 2.9 g/dL — ABNORMAL LOW (ref 3.5–5.0)
Alkaline Phosphatase: 50 U/L (ref 38–126)
Anion gap: 6 (ref 5–15)
BUN: <5 mg/dL — ABNORMAL LOW (ref 8–23)
CO2: 23 mmol/L (ref 22–32)
Calcium: 8.2 mg/dL — ABNORMAL LOW (ref 8.9–10.3)
Chloride: 101 mmol/L (ref 98–111)
Creatinine, Ser: 0.44 mg/dL (ref 0.44–1.00)
GFR, Estimated: >60 mL/min (ref >60)
Glucose, Bld: 111 mg/dL — ABNORMAL HIGH (ref 70–99)
Potassium: 3.1 mmol/L — ABNORMAL LOW (ref 3.5–5.1)
Sodium: 130 mmol/L — ABNORMAL LOW (ref 135–145)
Total Bilirubin: 0.6 mg/dL (ref 0.3–1.2)
Total Protein: 6.8 g/dL (ref 6.5–8.1)

## 2020-10-23 LAB — CBC
HCT: 29.6 % — ABNORMAL LOW (ref 36.0–46.0)
Hemoglobin: 9.9 g/dL — ABNORMAL LOW (ref 12.0–15.0)
MCH: 25.7 pg — ABNORMAL LOW (ref 26.0–34.0)
MCHC: 33.4 g/dL (ref 30.0–36.0)
MCV: 76.9 fL — ABNORMAL LOW (ref 80.0–100.0)
Platelets: 226 10*3/uL (ref 150–400)
RBC: 3.85 MIL/uL — ABNORMAL LOW (ref 3.87–5.11)
RDW: 15.6 % — ABNORMAL HIGH (ref 11.5–15.5)
WBC: 4.2 10*3/uL (ref 4.0–10.5)
nRBC: 0 % (ref 0.0–0.2)

## 2020-10-23 LAB — MAGNESIUM: Magnesium: 1.2 mg/dL — ABNORMAL LOW (ref 1.7–2.4)

## 2020-10-23 LAB — HEMOGLOBIN A1C
Hgb A1c MFr Bld: 5.4 % (ref 4.8–5.6)
Mean Plasma Glucose: 108 mg/dL

## 2020-10-23 LAB — PHOSPHORUS: Phosphorus: 4.1 mg/dL (ref 2.5–4.6)

## 2020-10-23 LAB — AMMONIA: Ammonia: 55 umol/L — ABNORMAL HIGH (ref 9–35)

## 2020-10-23 MED ORDER — MAGNESIUM SULFATE 4 GM/100ML IV SOLN
4.0000 g | Freq: Once | INTRAVENOUS | Status: AC
  Administered 2020-10-23: 4 g via INTRAVENOUS
  Filled 2020-10-23 (×2): qty 100

## 2020-10-23 MED ORDER — TRAZODONE HCL 50 MG PO TABS
50.0000 mg | ORAL_TABLET | Freq: Every day | ORAL | Status: DC
  Administered 2020-10-23: 50 mg via ORAL
  Filled 2020-10-23: qty 1

## 2020-10-23 MED ORDER — THIAMINE HCL 100 MG/ML IJ SOLN
500.0000 mg | Freq: Three times a day (TID) | INTRAVENOUS | Status: DC
  Administered 2020-10-23 – 2020-10-24 (×4): 500 mg via INTRAVENOUS
  Filled 2020-10-23 (×7): qty 5

## 2020-10-23 MED ORDER — POTASSIUM CHLORIDE CRYS ER 20 MEQ PO TBCR
40.0000 meq | EXTENDED_RELEASE_TABLET | Freq: Once | ORAL | Status: AC
  Administered 2020-10-23: 40 meq via ORAL
  Filled 2020-10-23: qty 2

## 2020-10-23 NOTE — Progress Notes (Signed)
Patient ID: Donna Aguirre, female   DOB: 10/08/1955, 65 y.o.   MRN: [id number] [facility] PROGRESS NOTE  EARNEST THALMAN DZH:[id number] DOB: 11/17/1955 DOA: 10/22/2020 PCP: [facility] [place]  HPI/Subjective: Patient brought in with altered mental status.  In speaking with the patient's daughter she states that she has not slept in 3 nights.  The patient's daughter states that she sometimes forgets to eat.  Patient's memory has been bad for a while.  She does not think that her mental status is back to baseline.  The patient felt well when I saw her this morning.  She ate some breakfast.  She had lots of diarrhea with the lactulose.  Objective: Vitals:   10/22/20 1947 10/23/20 0442  BP: (!) 158/82 (!) 151/72  Pulse: 75 68  Resp: 16 18  Temp: 97.7 F (36.5 C) 99 F (37.2 C)  SpO2: 100% 100%    Intake/Output Summary (Last 24 hours) at 10/23/2020 1258 Last data filed at 10/23/2020 0900 Gross per 24 hour  Intake 1016.21 ml  Output --  Net 1016.21 ml   Filed Weights   10/22/20 0847  Weight: 66.2 kg    ROS: Review of Systems  Respiratory: Negative for shortness of breath.   Cardiovascular: Negative for chest pain.  Gastrointestinal: Negative for abdominal pain, nausea and vomiting.   Exam: Physical Exam HENT:     Head: Normocephalic.     Mouth/Throat:     Pharynx: No oropharyngeal exudate.  Eyes:     General: Lids are normal.     Conjunctiva/sclera: Conjunctivae normal.  Cardiovascular:     Rate and Rhythm: Normal rate and regular rhythm.     Heart sounds: Normal heart sounds, S1 normal and S2 normal.  Pulmonary:     Breath sounds: Normal breath sounds. No decreased breath sounds, wheezing, rhonchi or rales.  Abdominal:     Palpations: Abdomen is soft.     Tenderness: There is no abdominal tenderness.  Musculoskeletal:     Right lower leg: No swelling.     Left lower leg: No swelling.  Skin:    General: Skin is warm.     Findings: No rash.   Neurological:     Mental Status: She is alert and oriented to person, place, and time.     Comments: Romberg negative.       Data Reviewed: Basic Metabolic Panel: Recent Labs  Lab 10/22/20 0858 10/23/20 0600  NA 128* 130*  K 4.3 3.1*  CL 92* 101  CO2 26 23  GLUCOSE 93 111*  BUN 6* <5*  CREATININE 0.60 0.44  CALCIUM 9.5 8.2*  MG  --  1.2*  PHOS  --  4.1   Liver Function Tests: Recent Labs  Lab 10/22/20 0858 10/23/20 0600  AST 34 15  ALT 10 7  ALKPHOS 70 50  BILITOT 1.0 0.6  PROT 9.7* 6.8  ALBUMIN 4.3 2.9*    Recent Labs  Lab 10/22/20 1013 10/23/20 0600  AMMONIA 64* 55*   CBC: Recent Labs  Lab 10/22/20 1013 10/23/20 0600  WBC 4.6 4.2  HGB 10.5* 9.9*  HCT 32.1* 29.6*  MCV 77.3* 76.9*  PLT 245 226    CBG: Recent Labs  Lab 10/22/20 0857  GLUCAP 85    Recent Results (from the past 240 hour(s))  Urine Culture     Status: Abnormal (Preliminary result)   Collection Time: 10/22/20  8:58 AM   Specimen: Urine, Random  Result Value Ref Range Status  Specimen Description   Final    URINE, RANDOM Performed at [facility], [address] [place], [place] [address]    Special Requests   Final    NONE Performed at [facility], [place], [place] [address]    Culture >=100,000 COLONIES/mL GRAM NEGATIVE RODS (A)  Final   Report Status PENDING  Incomplete  Resp Panel by RT-PCR (Flu A&B, Covid) Nasopharyngeal Swab     Status: None   Collection Time: 10/22/20 10:30 AM   Specimen: Nasopharyngeal Swab; Nasopharyngeal(NP) swabs in vial transport medium  Result Value Ref Range Status   SARS Coronavirus 2 by RT PCR NEGATIVE NEGATIVE Final    Comment: (NOTE) SARS-CoV-2 target nucleic acids are NOT DETECTED.  The SARS-CoV-2 RNA is generally detectable in upper respiratory specimens during the acute phase of infection. The lowest concentration of SARS-CoV-2 viral copies this assay can detect is 138 copies/mL. A negative  result does not preclude SARS-Cov-2 infection and should not be used as the sole basis for treatment or other patient management decisions. A negative result may occur with  improper specimen collection/handling, submission of specimen other than nasopharyngeal swab, presence of viral mutation(s) within the areas targeted by this assay, and inadequate number of viral copies(<138 copies/mL). A negative result must be combined with clinical observations, patient history, and epidemiological information. The expected result is Negative.  Fact Sheet for Patients:  EntrepreneurPulse.com.au  Fact Sheet for Healthcare Providers:  IncredibleEmployment.be  This test is no t yet approved or cleared by the [place] FDA and  has been authorized for detection and/or diagnosis of SARS-CoV-2 by FDA under an Emergency Use Authorization (EUA). This EUA will remain  in effect (meaning this test can be used) for the duration of the COVID-19 declaration under Section 564(b)(1) of the Act, 21 U.S.C.section 360bbb-3(b)(1), unless the authorization is terminated  or revoked sooner.       Influenza A by PCR NEGATIVE NEGATIVE Final   Influenza B by PCR NEGATIVE NEGATIVE Final    Comment: (NOTE) The Xpert Xpress SARS-CoV-2/FLU/RSV plus assay is intended as an aid in the diagnosis of influenza from Nasopharyngeal swab specimens and should not be used as a sole basis for treatment. Nasal washings and aspirates are unacceptable for Xpert Xpress SARS-CoV-2/FLU/RSV testing.  Fact Sheet for Patients: EntrepreneurPulse.com.au  Fact Sheet for Healthcare Providers: IncredibleEmployment.be  This test is not yet approved or cleared by the [place] FDA and has been authorized for detection and/or diagnosis of SARS-CoV-2 by FDA under an Emergency Use Authorization (EUA). This EUA will remain in effect (meaning this test can be used)  for the duration of the COVID-19 declaration under Section 564(b)(1) of the Act, 21 U.S.C. section 360bbb-3(b)(1), unless the authorization is terminated or revoked.  Performed at [facility], [address] [place], [place] [address]      Studies: CT Head Wo Contrast  Result Date: 10/22/2020 CLINICAL DATA:  Mental status change EXAM: CT HEAD WITHOUT CONTRAST TECHNIQUE: Contiguous axial images were obtained from the base of the skull through the vertex without intravenous contrast. COMPARISON:  CT head 08/25/2020 FINDINGS: Brain: No evidence of acute infarction, hemorrhage, hydrocephalus, extra-axial collection or mass lesion/mass effect. Vascular: Negative for hyperdense vessel Skull: Negative Sinuses/Orbits: Negative Other: None IMPRESSION: Negative CT head Electronically Signed   By: Franchot Gallo M.D.   On: 10/22/2020 09:45    Scheduled Meds: . amLODipine  5 mg Oral Daily  . enoxaparin (LOVENOX) injection  40  mg Subcutaneous Q24H  . folic acid  1 mg Oral Daily  . lisinopril  20 mg Oral Daily  . metoprolol tartrate  25 mg Oral BID  . multivitamin with minerals  1 tablet Oral Daily  . nicotine  14 mg Transdermal Daily  . pantoprazole  40 mg Oral Daily  . traZODone  50 mg Oral QHS  . cyanocobalamin  1,000 mcg Oral Daily   Continuous Infusions: . cefTRIAXone (ROCEPHIN)  IV 1 g (10/22/20 2157)  . thiamine injection 500 mg (10/23/20 0949)    Assessment/Plan:  1. Acute metabolic encephalopathy.  Acute delirium with not sleeping in 3 nights.  Will give trazodone at night to help out with that.  We will give high-dose thiamine with history of alcohol abuse.  We will give B12 supplementation with borderline low B12.  Continue to monitor further here in the hospital. 2. Severe hypomagnesemia.  Replaced 4 g of IV magnesium. 3. Hypokalemia replace oral potassium. 4. Possible hepatic encephalopathy with ammonia level of 64 on presentation.  Received lactulose and had 6  episodes of diarrhea.  We will hold lactulose today and reassess mental status tomorrow. 5. Acute cystitis without hematuria.  Klebsiella growing out of culture.  Continue Rocephin.  Follow-up sensitivities. 6. Mild hyponatremia 7. Essential hypertension on amlodipine lisinopril and metoprolol.  Check orthostatic vitals 8. Impaired fasting glucose.        Code Status:     Code Status Orders  (From admission, onward)         Start     Ordered   10/22/20 1542  Full code  Continuous        10/22/20 1541        Code Status History    Date Active Date Inactive Code Status Order ID Comments User Context   08/25/2020 0553 08/27/2020 2145 Full Code [id number]  Sidney Ace Arvella Merles, MD ED   07/19/2020 0749 07/21/2020 1809 Full Code [id number]  Collier Bullock, MD ED   11/22/2019 0359 11/25/2019 1915 Full Code [id number]  Athena Masse, MD ED   03/05/2019 0513 03/07/2019 2137 Full Code [id number]  Harrie Foreman, MD Inpatient   11/16/2018 2358 11/18/2018 1741 Full Code [id number]  Demetrios Loll, MD ED   Advance Care Planning Activity    Advance Directive Documentation   Flowsheet Row Most Recent Value  Type of Advance Directive Living will  Pre-existing out of facility DNR order (yellow form or pink MOST form) --  "MOST" Form in Place? --     Family Communication: Spoke with the patient's daughter on the phone at [phone] Disposition Plan: Status is: Observation  Dispo: The patient is from: Home              Anticipated d/c is to: Home              Patient currently has not slept in 3 nights.  Need to get her sleeping and reset her time [place] prior to any disposition.   Difficult to place patient.  Hopefully not  Time spent: 28 minutes  [place]

## 2020-10-23 NOTE — Clinical Social Work Note (Signed)
CSW acknowledges consult regarding SA resources and HH/DME needs. PT eval pending. Will provide all resources once PT recommendations have been made.  Dayton Scrape, [place]

## 2020-10-23 NOTE — Progress Notes (Signed)
Mobility Specialist - Progress Note   10/23/20 1300  Mobility  Activity Ambulated in hall  Level of Assistance Independent  Assistive Device None  Distance Ambulated (ft) 320 ft  Mobility Ambulated independently in hallway  Mobility Response Tolerated well  Mobility performed by Mobility specialist  $Mobility charge 1 Mobility   Pre-Mobility: 146/79  BP Post-mobility: 64 HR, 99% SpO2   Pt ambulated in hallway pushing IV pole. No LOB. Denied dizziness. Denied SOB on RA.    Kathee Delton Mobility Specialist 10/23/20, 1:24 PM

## 2020-10-23 NOTE — Plan of Care (Signed)
Continuing with plan of care. 

## 2020-10-24 DIAGNOSIS — K7290 Hepatic failure, unspecified without coma: Secondary | ICD-10-CM | POA: Diagnosis not present

## 2020-10-24 DIAGNOSIS — R410 Disorientation, unspecified: Secondary | ICD-10-CM | POA: Diagnosis not present

## 2020-10-24 DIAGNOSIS — N3000 Acute cystitis without hematuria: Secondary | ICD-10-CM | POA: Diagnosis not present

## 2020-10-24 LAB — COMPREHENSIVE METABOLIC PANEL
ALT: 7 U/L (ref 0–44)
AST: 16 U/L (ref 15–41)
Albumin: 3.2 g/dL — ABNORMAL LOW (ref 3.5–5.0)
Alkaline Phosphatase: 58 U/L (ref 38–126)
Anion gap: 8 (ref 5–15)
BUN: <5 mg/dL — ABNORMAL LOW (ref 8–23)
CO2: 25 mmol/L (ref 22–32)
Calcium: 8.5 mg/dL — ABNORMAL LOW (ref 8.9–10.3)
Chloride: 99 mmol/L (ref 98–111)
Creatinine, Ser: 0.44 mg/dL (ref 0.44–1.00)
GFR, Estimated: >60 mL/min (ref >60)
Glucose, Bld: 99 mg/dL (ref 70–99)
Potassium: 3.7 mmol/L (ref 3.5–5.1)
Sodium: 132 mmol/L — ABNORMAL LOW (ref 135–145)
Total Bilirubin: 0.7 mg/dL (ref 0.3–1.2)
Total Protein: 7.7 g/dL (ref 6.5–8.1)

## 2020-10-24 LAB — URINE CULTURE: Culture: >=100000 — AB

## 2020-10-24 LAB — MAGNESIUM: Magnesium: 1.8 mg/dL (ref 1.7–2.4)

## 2020-10-24 LAB — PHOSPHORUS: Phosphorus: 3.8 mg/dL (ref 2.5–4.6)

## 2020-10-24 MED ORDER — MAGNESIUM OXIDE 400 MG PO CAPS: 400.0000 mg | ORAL_CAPSULE | Freq: Every day | ORAL | 0 refills | Status: DC

## 2020-10-24 MED ORDER — ADULT MULTIVITAMIN W/MINERALS CH: 1.0000 | ORAL_TABLET | Freq: Every day | ORAL | Status: DC

## 2020-10-24 MED ORDER — CEPHALEXIN 500 MG PO CAPS: 500.0000 mg | ORAL_CAPSULE | Freq: Three times a day (TID) | ORAL | 0 refills | Status: AC

## 2020-10-24 MED ORDER — AMLODIPINE BESYLATE 5 MG PO TABS: 5.0000 mg | ORAL_TABLET | Freq: Every day | ORAL | 0 refills | Status: DC

## 2020-10-24 MED ORDER — NICOTINE 14 MG/24HR TD PT24: MEDICATED_PATCH | TRANSDERMAL | 0 refills | Status: DC

## 2020-10-24 MED ORDER — TRAZODONE HCL 50 MG PO TABS: 50.0000 mg | ORAL_TABLET | Freq: Every day | ORAL | 0 refills | Status: DC

## 2020-10-24 MED ORDER — LACTULOSE 20 GM/30ML PO SOLN: ORAL | 0 refills | Status: DC

## 2020-10-24 MED ORDER — MAGNESIUM SULFATE 2 GM/50ML IV SOLN
2.0000 g | Freq: Once | INTRAVENOUS | Status: AC
  Administered 2020-10-24: 2 g via INTRAVENOUS
  Filled 2020-10-24: qty 50

## 2020-10-24 MED ORDER — OMEPRAZOLE 40 MG PO CPDR: 40.0000 mg | DELAYED_RELEASE_CAPSULE | Freq: Every day | ORAL | 0 refills | Status: DC

## 2020-10-24 MED ORDER — FOLIC ACID 1 MG PO TABS: 1.0000 mg | ORAL_TABLET | Freq: Every day | ORAL | 0 refills | Status: DC

## 2020-10-24 MED ORDER — METOPROLOL TARTRATE 25 MG PO TABS
12.5000 mg | ORAL_TABLET | Freq: Two times a day (BID) | ORAL | 0 refills | Status: DC
Start: 2020-10-24 — End: 2021-09-28

## 2020-10-24 MED ORDER — LISINOPRIL 20 MG PO TABS: 20.0000 mg | ORAL_TABLET | Freq: Every day | ORAL | 0 refills | Status: DC

## 2020-10-24 MED ORDER — THIAMINE HCL 100 MG PO TABS: 100.0000 mg | ORAL_TABLET | Freq: Every day | ORAL | 0 refills | Status: DC

## 2020-10-24 MED ORDER — CYANOCOBALAMIN 1000 MCG PO TABS: 1000.0000 ug | ORAL_TABLET | Freq: Every day | ORAL | 0 refills | Status: DC

## 2020-10-24 NOTE — Progress Notes (Signed)
Mobility Specialist - Progress Note   10/24/20 1239  Mobility  Activity Ambulated in hall  Level of Assistance Independent  Assistive Device None  Distance Ambulated (ft) 900 ft  Mobility Ambulated independently in hallway  Mobility Response Tolerated well  Mobility performed by Mobility specialist  $Mobility charge 1 Mobility    During mobility: 96% SpO2 Post-mobility: 68 HR, 98% SPO2   Pt ambulated in hallway independently. No LOB. Denied SOB on RA.   Kathee Delton Mobility Specialist 10/24/20, 12:41 PM

## 2020-10-24 NOTE — TOC Transition Note (Signed)
Transition of Care [facility]) - CM/SW Discharge Note   Patient Details  Name: Donna Aguirre MRN: [id number] Date of Birth: 07/31/1955  Transition of Care [facility]) CM/SW Contact:  Harriet Masson, RN Phone Number: [phone] 10/24/2020, 4:36 PM   Clinical Narrative:    Provider spoke with daughter Donna Aguirre today and it has been decided pt will be discharged today with [facility] and aide services. RN has spoke with daughter Donna Aguirre and made arranged for [place] to services pt for the above needs. Spoke with Donna Aguirre who has accepted services.  Daughter understands these services are temporary and she may have out of pocket expenses for long term aides services once [facility] is done (verbalized an understanding). Pt will be discharged home lives with her daughter Donna Aguirre and granddaughter in a mobile home. Daughter will transport pt to all her medical appointments, obtain her medications from the local pharmacy and transport pt upon her discharged today home. No other request and all inquires addressed accordingly.   TOC will continue to follow up accordingly.     Final next level of care: [place] Barriers to Discharge: Barriers Resolved   Patient Goals and CMS Choice        Discharge Placement                  Name of family member notified: Donna Aguirre Patient and family notified of of transfer: 10/24/20  Discharge Plan and Services                          HH Arranged: RN,Nurse's Aide [place]: [place] ([place]) Date [place]: 10/24/20 Time [place]: [phone] Representative spoke with at [place]: [place] ([place]) Interventions     Readmission Risk Interventions No flowsheet data found.

## 2020-10-24 NOTE — Progress Notes (Signed)
Mobility Specialist - Progress Note   10/24/20 1635  Mobility  Activity Ambulated in hall  Level of Assistance Independent  Assistive Device None  Distance Ambulated (ft) 800 ft  Mobility Ambulated independently in hallway  Mobility Response Tolerated well  Mobility performed by Mobility specialist  $Mobility charge 1 Mobility    Pt requesting to ambulate a second time this date prior to d/c. Pt ambulated > 500' independently. No LOB pushing IV pole. Denied SOB on RA throughout session.    Donna Aguirre Mobility Specialist 10/24/20, 4:36 PM

## 2020-10-24 NOTE — Plan of Care (Signed)
Discharge teaching completed and patient in stable condition.

## 2020-10-24 NOTE — TOC Progression Note (Signed)
Transition of Care [facility]) - Progression Note    Patient Details  Name: Donna Aguirre MRN: [id number] Date of Birth: 1956-03-14  Transition of Care [facility]) CM/SW Contact  Zigmund Daniel Dorian Pod, RN Phone [phone] 10/24/2020, 9:59 AM  Clinical Narrative:    RN spoke with pt concerning substance counseling and offered resources (pt declined). Also discussed possible [facility] in the home if recommended by therapy after evaluation. Pt adamantly declined and indicated she did not need any therapy. Attempted to gather information concerning her support system. She states she has a place to go but did not know if her daughter would be picking her up from the hospital. RN offered assistance with transportation foe taxi voucher however pt not sure. Pt did not wish to pursue additional living information. RN spoke with the bedside RN and offered to provide a taxi voucher if needed when pt is medical stable for discharge.  TOC team will continue to follow for any additional needs.        Expected Discharge Plan and Services                                                 Social Determinants of Health (SDOH) Interventions    Readmission Risk Interventions No flowsheet data found.

## 2020-10-24 NOTE — Plan of Care (Signed)
Continuing with plan of care. 

## 2020-10-24 NOTE — Discharge Summary (Addendum)
[facility] [place] NAME: Donna Aguirre    MR#:  [id number]  DATE OF BIRTH:  09/25/1955  DATE OF ADMISSION:  10/22/2020 ADMITTING PHYSICIAN: Loletha Grayer, MD  DATE OF DISCHARGE: 10/24/2020  PRIMARY CARE PHYSICIAN: [facility] [place]    ADMISSION DIAGNOSIS:  Hepatic encephalopathy ([place]) [K72.90] Altered mental status [R41.82] Altered mental status, unspecified altered mental status type [D62.22] Acute metabolic encephalopathy [L79.89]  DISCHARGE DIAGNOSIS:  Principal Problem:   Altered mental status Active Problems:   Alcohol abuse   Delirium due to another medical condition, acute, mixed level of activity   Nicotine dependence   Diabetes mellitus without complication ([place])   Essential hypertension   Acute metabolic encephalopathy   Hepatic encephalopathy ([place])   Acute cystitis without hematuria   SECONDARY DIAGNOSIS:   Past Medical History:  Diagnosis Date  . Abdominal pain 11/22/2019  . Arthritis   . Diabetes mellitus without complication ([place])   . Gout   . Hypertension   . Hypokalemia 11/22/2019  . Hypomagnesemia 11/22/2019  . Hyponatremia 11/16/2018    HOSPITAL COURSE:   1.  Acute metabolic encephalopathy probably combination of a few things acute delirium with not sleeping on 3 nights, acute cystitis, B12 deficiency and prior history of alcohol abuse.  The patient was given IV antibiotics for UTI, high-dose thiamine, trazodone at night to sleep and B12 supplementation.  Patient's daughter came to evaluate the patient and she is doing better and was okay with bringing the patient home.  I think the patient will need supervision at home because I think she has an underlying dementia.  Follow-up with PMD as outpatient. 2.  Severe hypomagnesemia.  I replace magnesium 4 g yesterday and 2 g again today.  We will give magnesium oxide upon disposition. 3.  Hypokalemia replaced into the normal range 4.   Possibility of hepatic encephalopathy with ammonia level 64 on presentation.  The patient received lactulose and had 6 episodes of diarrhea.  Will give a lower dose daily lactulose upon going home and can hold that if having lots of diarrhea. 5.  Acute cystitis without hematuria.  Klebsiella growing out of the urine culture.  Patient was on Rocephin here and will switch over to Keflex upon going home. 6.  Essential hypertension on amlodipine, lisinopril and metoprolol. 7.  Impaired fasting glucose.  Hemoglobin A1c 5.4.  Patient is not a diabetic 8.  Hyponatremia.  Sodium 132 upon disposition 9.  Anemia secondary to B12 deficiency.  Continue B12 supplementation 10.  Try to set up home health aide and RN.  Patient did well with physical therapy.  DISCHARGE CONDITIONS:   Satisfactory  CONSULTS OBTAINED:  None  DRUG ALLERGIES:  No Known Allergies  DISCHARGE MEDICATIONS:   Allergies as of 10/24/2020   No Known Allergies     Medication List    TAKE these medications   amLODipine 5 MG tablet Commonly known as: NORVASC Take 1 tablet (5 mg total) by mouth daily.   cephALEXin 500 MG capsule Commonly known as: KEFLEX Take 1 capsule (500 mg total) by mouth 3 (three) times daily for 4 days.   cyanocobalamin 1000 MCG tablet Take 1 tablet (1,000 mcg total) by mouth daily.   folic acid 1 MG tablet Commonly known as: FOLVITE Take 1 tablet (1 mg total) by mouth daily. Start taking on: Oct 25, 2020   Lactulose 20 GM/30ML Soln 10g daily (hold if more than 3 bowel movements daily)  lisinopril 20 MG tablet Commonly known as: ZESTRIL Take 1 tablet (20 mg total) by mouth daily.   Magnesium Oxide 400 MG Caps Take 1 capsule (400 mg total) by mouth daily at 12 noon.   metoprolol tartrate 25 MG tablet Commonly known as: LOPRESSOR Take 0.5 tablets (12.5 mg total) by mouth 2 (two) times daily. What changed: how much to take   multivitamin with minerals Tabs tablet Take 1 tablet by mouth  daily. Start taking on: Oct 25, 2020   nicotine 14 mg/24hr patch Commonly known as: NICODERM CQ - dosed in mg/24 hours One 14mg  patch chest wall daily (okay to substitute generic) dont use if smoking   omeprazole 40 MG capsule Commonly known as: PRILOSEC Take 1 capsule (40 mg total) by mouth daily. What changed: when to take this   thiamine 100 MG tablet Take 1 tablet (100 mg total) by mouth daily.   traZODone 50 MG tablet Commonly known as: DESYREL Take 1 tablet (50 mg total) by mouth at bedtime.        DISCHARGE INSTRUCTIONS:   Follow-up PMD 5 days  If you experience worsening of your admission symptoms, develop shortness of breath, life threatening emergency, suicidal or homicidal thoughts you must seek medical attention immediately by calling 911 or calling your MD immediately  if symptoms less severe.  You Must read complete instructions/literature along with all the possible adverse reactions/side effects for all the Medicines you take and that have been prescribed to you. Take any new Medicines after you have completely understood and accept all the possible adverse reactions/side effects.   Please note  You were cared for by a hospitalist during your hospital stay. If you have any questions about your discharge medications or the care you received while you were in the hospital after you are discharged, you can call the unit and asked to speak with the hospitalist on call if the hospitalist that took care of you is not available. Once you are discharged, your primary care physician will handle any further medical issues. Please note that NO REFILLS for any discharge medications will be authorized once you are discharged, as it is imperative that you return to your primary care physician (or establish a relationship with a primary care physician if you do not have one) for your aftercare needs so that they can reassess your need for medications and monitor your lab  values.    Today   CHIEF COMPLAINT:   Chief Complaint  Patient presents with  . Altered Mental Status    HISTORY OF PRESENT ILLNESS:  Donna Aguirre  is a 65 y.o. female coming in with altered mental status   VITAL SIGNS:  Blood pressure (!) 159/76, pulse (!) 58, temperature 97.6 F (36.4 C), temperature source Oral, resp. rate 18, height 5\' 4"  (1.626 m), weight 66.2 kg, SpO2 100 %.  I/O:    Intake/Output Summary (Last 24 hours) at 10/24/2020 1610 Last data filed at 10/24/2020 1328 Gross per 24 hour  Intake 649.89 ml  Output --  Net 649.89 ml    PHYSICAL EXAMINATION:  GENERAL:  65 y.o.-year-old patient lying in the bed with no acute distress.  EYES: Pupils equal, round, reactive to light and accommodation. No scleral icterus.  HEENT: Head atraumatic, normocephalic. Oropharynx and nasopharynx clear. .  LUNGS: Normal breath sounds bilaterally, no wheezing, rales,rhonchi or crepitation. No use of accessory muscles of respiration.  CARDIOVASCULAR: S1, S2 normal. No murmurs, rubs, or gallops.  ABDOMEN: Soft, non-tender, non-distended.  Bowel sounds present. No organomegaly or mass.  EXTREMITIES: No pedal edema.  NEUROLOGIC: Cranial nerves II through XII are intact. Muscle strength 5/5 in all extremities. Sensation intact. Gait not checked.  PSYCHIATRIC: The patient is alert and answers some questions.  Starts going into other stories. SKIN: No obvious rash, lesion, or ulcer.   DATA REVIEW:   CBC Recent Labs  Lab 10/23/20 0600  WBC 4.2  HGB 9.9*  HCT 29.6*  PLT 226    Chemistries  Recent Labs  Lab 10/24/20 0421  NA 132*  K 3.7  CL 99  CO2 25  GLUCOSE 99  BUN <5*  CREATININE 0.44  CALCIUM 8.5*  MG 1.8  AST 16  ALT 7  ALKPHOS 17  BILITOT 0.7    Microbiology Results  Results for orders placed or performed during the hospital encounter of 10/22/20  Urine Culture     Status: Abnormal   Collection Time: 10/22/20  8:58 AM   Specimen: Urine, Random   Result Value Ref Range Status   Specimen Description   Final    URINE, RANDOM Performed at [facility], [address] [place], [place] [address]    Special Requests   Final    NONE Performed at [facility], [address], [place], [place] [address]    Culture >=100,000 COLONIES/mL KLEBSIELLA PNEUMONIAE (A)  Final   Report Status 10/24/2020 FINAL  Final   Organism ID, Bacteria KLEBSIELLA PNEUMONIAE (A)  Final      Susceptibility   Klebsiella pneumoniae - MIC*    AMPICILLIN RESISTANT Resistant     CEFAZOLIN <=4 SENSITIVE Sensitive     CEFEPIME <=0.12 SENSITIVE Sensitive     CEFTRIAXONE <=0.25 SENSITIVE Sensitive     CIPROFLOXACIN <=0.25 SENSITIVE Sensitive     GENTAMICIN <=1 SENSITIVE Sensitive     IMIPENEM <=0.25 SENSITIVE Sensitive     NITROFURANTOIN 64 INTERMEDIATE Intermediate     TRIMETH/SULFA <=20 SENSITIVE Sensitive     AMPICILLIN/SULBACTAM <=2 SENSITIVE Sensitive     PIP/TAZO <=4 SENSITIVE Sensitive     * >=100,000 COLONIES/mL KLEBSIELLA PNEUMONIAE  Resp Panel by RT-PCR (Flu A&B, Covid) Nasopharyngeal Swab     Status: None   Collection Time: 10/22/20 10:30 AM   Specimen: Nasopharyngeal Swab; Nasopharyngeal(NP) swabs in vial transport medium  Result Value Ref Range Status   SARS Coronavirus 2 by RT PCR NEGATIVE NEGATIVE Final    Comment: (NOTE) SARS-CoV-2 target nucleic acids are NOT DETECTED.  The SARS-CoV-2 RNA is generally detectable in upper respiratory specimens during the acute phase of infection. The lowest concentration of SARS-CoV-2 viral copies this assay can detect is 138 copies/mL. A negative result does not preclude SARS-Cov-2 infection and should not be used as the sole basis for treatment or other patient management decisions. A negative result may occur with  improper specimen collection/handling, submission of specimen other than nasopharyngeal swab, presence of viral mutation(s) within the areas targeted by this assay, and  inadequate number of viral copies(<138 copies/mL). A negative result must be combined with clinical observations, patient history, and epidemiological information. The expected result is Negative.  Fact Sheet for Patients:  EntrepreneurPulse.com.au  Fact Sheet for Healthcare Providers:  IncredibleEmployment.be  This test is no t yet approved or cleared by the [place] FDA and  has been authorized for detection and/or diagnosis of SARS-CoV-2 by FDA under an Emergency Use Authorization (EUA). This EUA will remain  in effect (meaning this test can be used) for the duration of  the COVID-19 declaration under Section 564(b)(1) of the Act, 21 U.S.C.section 360bbb-3(b)(1), unless the authorization is terminated  or revoked sooner.       Influenza A by PCR NEGATIVE NEGATIVE Final   Influenza B by PCR NEGATIVE NEGATIVE Final    Comment: (NOTE) The Xpert Xpress SARS-CoV-2/FLU/RSV plus assay is intended as an aid in the diagnosis of influenza from Nasopharyngeal swab specimens and should not be used as a sole basis for treatment. Nasal washings and aspirates are unacceptable for Xpert Xpress SARS-CoV-2/FLU/RSV testing.  Fact Sheet for Patients: EntrepreneurPulse.com.au  Fact Sheet for Healthcare Providers: IncredibleEmployment.be  This test is not yet approved or cleared by the [place] FDA and has been authorized for detection and/or diagnosis of SARS-CoV-2 by FDA under an Emergency Use Authorization (EUA). This EUA will remain in effect (meaning this test can be used) for the duration of the COVID-19 declaration under Section 564(b)(1) of the Act, 21 U.S.C. section 360bbb-3(b)(1), unless the authorization is terminated or revoked.  Performed at [facility], [address] [place], [place] [address]     Management plans discussed with the patient, family and they are in agreement.  CODE  STATUS:     Code Status Orders  (From admission, onward)         Start     Ordered   10/22/20 1542  Full code  Continuous        10/22/20 1541        Code Status History    Date Active Date Inactive Code Status Order ID Comments User Context   08/25/2020 0553 08/27/2020 2145 Full Code [id number]  Sidney Ace Arvella Merles, MD ED   07/19/2020 0749 07/21/2020 1809 Full Code [id number]  Collier Bullock, MD ED   11/22/2019 0359 11/25/2019 1915 Full Code [id number]  Athena Masse, MD ED   03/05/2019 0513 03/07/2019 2137 Full Code [id number]  Harrie Foreman, MD Inpatient   11/16/2018 2358 11/18/2018 1741 Full Code [id number]  Demetrios Loll, MD ED   Advance Care Planning Activity    Advance Directive Documentation   Flowsheet Row Most Recent Value  Type of Advance Directive Living will  Pre-existing out of facility DNR order (yellow form or pink MOST form) --  "MOST" Form in Place? --      TOTAL TIME TAKING CARE OF THIS PATIENT: 35 minutes.    Loletha Grayer M.D on 10/24/2020 at 4:10 PM   [facility]  CC: Primary care physician; [facility] [place]

## 2020-10-24 NOTE — Evaluation (Signed)
Physical Therapy Evaluation Patient Details Name: Donna Aguirre MRN: [id number] DOB: 12-Sep-1955 Today's Date: 10/24/2020   History of Present Illness  Pt is a 65 y.o. female presenting to hospital 5/26 with AMS x1 week.  Pt admitted with acute metabolic encephalopathy, severe hypomagnesemia, hypokalemia, possible hepatic encephalopathy, and acute cystitis.  PMH includes DM, htn, COVID-19.  Clinical Impression  Prior to hospital admission, pt was independent with ambulation; pt reporting therapist already asked pt her home set-up questions yesterday (although today was the first time this therapist had met pt) and pt refused to answer them during today's session.  Currently pt is independent with transfers and SBA with ambulation 350 feet (no UE support).  No loss of balance noted during sessions activities.  While walking in hallway, pt did suddenly grab a stack of pamphlets sitting at nursing station; therapist able to encourage pt to put all but 1 pamphlet back after walking 1 more lap around nursing station.  Oriented to person, place, and time but not situation.  Pt verbose and tangential during most of session requiring redirection intermittently.  Pt would benefit from skilled PT to address noted impairments and functional limitations (see below for any additional details).  Upon hospital discharge, no further PT needs anticipated.    Follow Up Recommendations No PT follow up    Equipment Recommendations  None recommended by PT    Recommendations for Other Services       Precautions / Restrictions Precautions Precautions: Fall Restrictions Weight Bearing Restrictions: No      Mobility  Bed Mobility               General bed mobility comments: Deferred (pt sitting on edge of bed beginning and end of session)    Transfers Overall transfer level: Independent Equipment used: None             General transfer comment: steady safe transfers  noted  Ambulation/Gait Ambulation/Gait assistance: Supervision Gait Distance (Feet): 350 Feet Assistive device: None Gait Pattern/deviations: Step-through pattern     General Gait Details: steady ambulation  Stairs            Wheelchair Mobility    Modified Rankin (Stroke Patients Only)       Balance Overall balance assessment: Needs assistance Sitting-balance support: No upper extremity supported;Feet supported Sitting balance-Leahy Scale: Normal Sitting balance - Comments: steady sitting reaching outside BOS   Standing balance support: No upper extremity supported;During functional activity Standing balance-Leahy Scale: Good Standing balance comment: no loss of balance noted during ambulation               High Level Balance Comments: No loss of balance with ambulation with head turns R/L/up/down, increasing/decreasing speed (mild changes in gait speed).             Pertinent Vitals/Pain Pain Assessment: No/denies pain  Vitals (HR and O2 on room air) stable and WFL throughout treatment session.    Home Living                   Additional Comments: Pt verbose and tengential; pt reporting telling therapist home living information yesterday and would not answer questions d/t this    Prior Function Level of Independence: Independent               Hand Dominance        Extremity/Trunk Assessment   Upper Extremity Assessment Upper Extremity Assessment: Generalized weakness    Lower Extremity Assessment Lower Extremity Assessment:  Generalized weakness    Cervical / Trunk Assessment Cervical / Trunk Assessment: Normal  Communication   Communication:  (pt talking almost entire therapy session)  Cognition Arousal/Alertness: Awake/alert   Overall Cognitive Status: No family/caregiver present to determine baseline cognitive functioning                                 General Comments: Oriented to person, place, time,  but not situation.  Easily distracted.  Verbose.      General Comments  Pt agreeable to PT session.    Exercises     Assessment/Plan    PT Assessment Patient needs continued PT services  PT Problem List Decreased strength;Decreased mobility;Decreased cognition       PT Treatment Interventions Gait training;Stair training;Balance training;Therapeutic exercise;Functional mobility training;Therapeutic activities;Cognitive remediation;Patient/family education    PT Goals (Current goals can be found in the Care Plan section)  Acute Rehab PT Goals Patient Stated Goal: to go home PT Goal Formulation: With patient Time For Goal Achievement: 11/07/20 Potential to Achieve Goals: Good    Frequency Min 2X/week   Barriers to discharge        Co-evaluation               AM-PAC PT "6 Clicks" Mobility  Outcome Measure Help needed turning from your back to your side while in a flat bed without using bedrails?: None Help needed moving from lying on your back to sitting on the side of a flat bed without using bedrails?: None Help needed moving to and from a bed to a chair (including a wheelchair)?: None Help needed standing up from a chair using your arms (e.g., wheelchair or bedside chair)?: None Help needed to walk in hospital room?: A Little Help needed climbing 3-5 steps with a railing? : A Little 6 Click Score: 22    End of Session Equipment Utilized During Treatment: Gait belt Activity Tolerance: Patient tolerated treatment well Patient left: with call bell/phone within reach;with bed alarm set (sitting on edge of bed) Nurse Communication: Other (comment) (nurse did not answer phone when therapist attempted to call nurse) PT Visit Diagnosis: Muscle weakness (generalized) (M62.81)    Time: 3338-3291 PT Time Calculation (min) (ACUTE ONLY): 12 min   Charges:   PT Evaluation $PT Eval Low Complexity: 1 Low         Saman Giddens, PT 10/24/20, 10:55 AM

## 2020-10-27 LAB — VITAMIN B1: Vitamin B1 (Thiamine): 70.8 nmol/L (ref 66.5–200.0)

## 2021-03-12 ENCOUNTER — Emergency Department
Admission: EM | Admit: 2021-03-12 | Discharge: 2021-03-12 | Disposition: A | Discharge status: Home / Self Care | Payer: Medicare Other | Attending: Emergency Medicine | Admitting: Emergency Medicine

## 2021-03-12 ENCOUNTER — Other Ambulatory Visit: Payer: Self-pay

## 2021-03-12 DIAGNOSIS — E876 Hypokalemia: Secondary | ICD-10-CM | POA: Diagnosis not present

## 2021-03-12 DIAGNOSIS — I10 Essential (primary) hypertension: Secondary | ICD-10-CM | POA: Insufficient documentation

## 2021-03-12 DIAGNOSIS — R531 Weakness: Secondary | ICD-10-CM | POA: Diagnosis present

## 2021-03-12 DIAGNOSIS — Z79899 Other long term (current) drug therapy: Secondary | ICD-10-CM | POA: Insufficient documentation

## 2021-03-12 DIAGNOSIS — M79602 Pain in left arm: Secondary | ICD-10-CM | POA: Diagnosis not present

## 2021-03-12 DIAGNOSIS — Z8616 Personal history of COVID-19: Secondary | ICD-10-CM | POA: Insufficient documentation

## 2021-03-12 DIAGNOSIS — F17210 Nicotine dependence, cigarettes, uncomplicated: Secondary | ICD-10-CM | POA: Insufficient documentation

## 2021-03-12 DIAGNOSIS — E119 Type 2 diabetes mellitus without complications: Secondary | ICD-10-CM | POA: Insufficient documentation

## 2021-03-12 DIAGNOSIS — Z5321 Procedure and treatment not carried out due to patient leaving prior to being seen by health care provider: Secondary | ICD-10-CM | POA: Diagnosis not present

## 2021-03-12 LAB — BASIC METABOLIC PANEL
Anion gap: 12 (ref 5–15)
BUN: 11 mg/dL (ref 8–23)
CO2: 29 mmol/L (ref 22–32)
Calcium: 14.6 mg/dL (ref 8.9–10.3)
Chloride: 93 mmol/L — ABNORMAL LOW (ref 98–111)
Creatinine, Ser: 0.99 mg/dL (ref 0.44–1.00)
GFR, Estimated: >60 mL/min (ref >60)
Glucose, Bld: 114 mg/dL — ABNORMAL HIGH (ref 70–99)
Potassium: 2.4 mmol/L — CL (ref 3.5–5.1)
Sodium: 134 mmol/L — ABNORMAL LOW (ref 135–145)

## 2021-03-12 LAB — CBC
HCT: 28.1 % — ABNORMAL LOW (ref 36.0–46.0)
Hemoglobin: 8.8 g/dL — ABNORMAL LOW (ref 12.0–15.0)
MCH: 23.8 pg — ABNORMAL LOW (ref 26.0–34.0)
MCHC: 31.3 g/dL (ref 30.0–36.0)
MCV: 76.2 fL — ABNORMAL LOW (ref 80.0–100.0)
Platelets: 493 10*3/uL — ABNORMAL HIGH (ref 150–400)
RBC: 3.69 MIL/uL — ABNORMAL LOW (ref 3.87–5.11)
RDW: 15.2 % (ref 11.5–15.5)
WBC: 12.6 10*3/uL — ABNORMAL HIGH (ref 4.0–10.5)
nRBC: 0 % (ref 0.0–0.2)

## 2021-03-12 MED ORDER — SODIUM CHLORIDE 0.9 % IV BOLUS
1000.0000 mL | INTRAVENOUS | Status: DC
  Administered 2021-03-12: 1000 mL via INTRAVENOUS

## 2021-03-12 MED ORDER — SODIUM CHLORIDE 0.9 % IV SOLN: 1000.0000 mL | Freq: Once | INTRAVENOUS | Status: DC

## 2021-03-12 NOTE — Discharge Instructions (Signed)
I recommended that you be admitted to the hospital for your very high calcium levels, this is a significant risk to you, you indicated that you understand the risk and are willing to accept it.  You can return to the hospital anytime

## 2021-03-12 NOTE — ED Notes (Signed)
Per MD, pt to receive 1L NS then can leave AMA.

## 2021-03-12 NOTE — ED Triage Notes (Signed)
Pt here via [facility] from home with weakness. Pt was not able to get out of her chair this am which she is normally able to do. Pt has a hx of DM with early dementia. Ems vitals: 90, 99%, 105/64, and cbg-155.

## 2021-03-12 NOTE — ED Provider Notes (Signed)
[facility] Emergency Department Provider Note   ____________________________________________    I have reviewed the triage vital signs and the nursing notes.   HISTORY  Chief Complaint Weakness     HPI Donna Aguirre is a 65 y.o. female who presents with complaints of weakness.  Apparently the patient had a fall 2 days ago, had difficulty ambulating this morning so presented to the emergency department.  Denies fevers or chills.  No nausea or vomiting.  Notes that she "cannot be here long so we need to hurry up ".  Denies abdominal pain.  Complains of some soreness to her left arm from fall denies head injury.  Past Medical History:  Diagnosis Date   Abdominal pain 11/22/2019   Arthritis    Diabetes mellitus without complication ([place])    Gout    Hypertension    Hypokalemia 11/22/2019   Hypomagnesemia 11/22/2019   Hyponatremia 11/16/2018    Patient Active Problem List   Diagnosis Date Noted   Acute metabolic encephalopathy    Hepatic encephalopathy    Acute cystitis without hematuria    Altered mental status 10/22/2020   Diabetes mellitus without complication ([place])    Essential hypertension    Hypertensive urgency 08/27/2020   Encephalopathy 08/25/2020   Electrolyte abnormality    Gastroenteritis due to COVID-19 virus 07/20/2020   Hypokalemia 07/19/2020   COVID-19 virus detected 07/19/2020   Nicotine dependence 07/19/2020   E-coli UTI    Tachycardia    Esophageal stricture 11/23/2019   Dysphagia    Esophagitis    Alcohol use disorder, moderate, dependence ([place]) 11/22/2019   Elevated troponin 11/22/2019   Pancytopenia ([place]) 11/22/2019   HTN (hypertension) 11/22/2019   History of esophageal stricture on EGD 12/2018 [facility] 11/22/2019   PUD (peptic ulcer disease), with hx of perforated gastric ulcer 11/22/2019   Vomiting 11/22/2019   Hypomagnesemia 11/22/2019   Alcohol abuse    Hyponatremia 11/16/2018   Acute delirium 10/23/2018     Past Surgical History:  Procedure Laterality Date   ESOPHAGOGASTRODUODENOSCOPY N/A 08/26/2020   Procedure: ESOPHAGOGASTRODUODENOSCOPY (EGD);  Surgeon: Jonathon Bellows, MD;  Location: [facility] ENDOSCOPY;  Service: Gastroenterology;  Laterality: N/A;   ESOPHAGOGASTRODUODENOSCOPY (EGD) WITH PROPOFOL N/A 11/23/2019   Procedure: ESOPHAGOGASTRODUODENOSCOPY (EGD) WITH PROPOFOL;  Surgeon: [name], MD;  Location: [facility] ENDOSCOPY;  Service: Gastroenterology;  Laterality: N/A;   perforated gastric ulcer      Prior to Admission medications   Medication Sig Start Date End Date Taking? Authorizing Provider  amLODipine (NORVASC) 5 MG tablet Take 1 tablet (5 mg total) by mouth daily. 10/24/20   Loletha Grayer, MD  cyanocobalamin 1000 MCG tablet Take 1 tablet (1,000 mcg total) by mouth daily. 10/24/20   Loletha Grayer, MD  folic acid (FOLVITE) 1 MG tablet Take 1 tablet (1 mg total) by mouth daily. 10/25/20   Loletha Grayer, MD  Lactulose 20 GM/30ML SOLN 10g daily (hold if more than 3 bowel movements daily) 10/24/20   Loletha Grayer, MD  lisinopril (ZESTRIL) 20 MG tablet Take 1 tablet (20 mg total) by mouth daily. 10/24/20 01/22/21  Loletha Grayer, MD  Magnesium Oxide 400 MG CAPS Take 1 capsule (400 mg total) by mouth daily at 12 noon. 10/24/20   Loletha Grayer, MD  metoprolol tartrate (LOPRESSOR) 25 MG tablet Take 0.5 tablets (12.5 mg total) by mouth 2 (two) times daily. 10/24/20 11/23/20  Loletha Grayer, MD  Multiple Vitamin (MULTIVITAMIN WITH MINERALS) TABS tablet Take 1 tablet by mouth daily.  10/25/20   Loletha Grayer, MD  nicotine (NICODERM CQ - DOSED IN MG/24 HOURS) 14 mg/24hr patch One 14mg  patch chest wall daily (okay to substitute generic) dont use if smoking 10/24/20   Loletha Grayer, MD  omeprazole (PRILOSEC) 40 MG capsule Take 1 capsule (40 mg total) by mouth daily. 10/24/20 11/23/20  Loletha Grayer, MD  thiamine 100 MG tablet Take 1 tablet (100 mg total) by mouth daily. 10/24/20    Loletha Grayer, MD  traZODone (DESYREL) 50 MG tablet Take 1 tablet (50 mg total) by mouth at bedtime. 10/24/20   Loletha Grayer, MD     Allergies Patient has no known allergies.  Family History  Problem Relation Age of Onset   Hypertension Mother    Hypertension Father     Social History Social History   Tobacco Use   Smoking status: Some Days    Packs/day: 0.50    Types: Cigarettes   Smokeless tobacco: Current    Types: Snuff  Vaping Use   Vaping Use: Never used  Substance Use Topics   Alcohol use: Yes    Alcohol/week: 7.0 standard drinks    Types: 7 Cans of beer per week    Comment: one 40 oz. states she drinks everytime some one brings her something    Drug use: No    Review of Systems  Constitutional: As above Eyes: No visual changes.  ENT: No sore throat. Cardiovascular: Denies chest pain. Respiratory: Denies shortness of breath. Gastrointestinal: No abdominal pain.  Genitourinary: Negative for dysuria. Musculoskeletal: Negative for back pain. Skin: Negative for rash. Neurological: Negative for headaches    ____________________________________________   PHYSICAL EXAM:  VITAL SIGNS: ED Triage Vitals  Enc Vitals Group     BP 03/12/21 1144 130/67     Pulse Rate 03/12/21 1144 93     Resp 03/12/21 1144 18     Temp 03/12/21 1144 98 F (36.7 C)     Temp Source 03/12/21 1144 Oral     SpO2 03/12/21 1144 99 %     Weight 03/12/21 1147 65 kg (143 lb 4.8 oz)     Height 03/12/21 1147 1.626 m (5\' 4" )     Head Circumference --      Peak Flow --      Pain Score 03/12/21 1147 7     Pain Loc --      Pain Edu? --      Excl. in [place]? --     Constitutional: Alert and oriented.  Eyes: Conjunctivae are normal.  Head: Atraumatic.  Neck:  Painless ROM Cardiovascular: Normal rate, regular rhythm. Grossly normal heart sounds.  Good peripheral circulation. Respiratory: Normal respiratory effort.  No retractions. Lungs CTAB. Gastrointestinal: Soft and nontender.  No distention.    Musculoskeletal: No lower extremity tenderness nor edema.  Warm and well perfused Neurologic:  Normal speech and language. No gross focal neurologic deficits are appreciated.  Skin:  Skin is warm, dry and intact. No rash noted. Psychiatric: Mood and affect are normal. Speech and behavior are normal.  ____________________________________________   LABS (all labs ordered are listed, but only abnormal results are displayed)  Labs Reviewed  BASIC METABOLIC PANEL - Abnormal; Notable for the following components:      Result Value   Sodium 134 (*)    Potassium 2.4 (*)    Chloride 93 (*)    Glucose, Bld 114 (*)    Calcium 14.6 (*)    All other components within normal limits  CBC -  Abnormal; Notable for the following components:   WBC 12.6 (*)    RBC 3.69 (*)    Hemoglobin 8.8 (*)    HCT 28.1 (*)    MCV 76.2 (*)    MCH 23.8 (*)    Platelets 493 (*)    All other components within normal limits  URINALYSIS, COMPLETE (UACMP) WITH MICROSCOPIC  CBG MONITORING, ED   ____________________________________________  EKG ED ECG REPORT I, Lavonia Drafts, the attending physician, personally viewed and interpreted this ECG.  Date: 03/12/2021  Rhythm: normal sinus rhythm QRS Axis: normal Intervals: normal ST/T Wave abnormalities: normal Narrative Interpretation: no evidence of acute ischemia  ____________________________________________  RADIOLOGY  None ____________________________________________   PROCEDURES  Procedure(s) performed: No  Procedures   Critical Care performed: No ____________________________________________   INITIAL IMPRESSION / ASSESSMENT AND PLAN / ED COURSE  Pertinent labs & imaging results that were available during my care of the patient were reviewed by me and considered in my medical decision making (see chart for details).   Patient presents with complaints of weakness as above, afebrile, overall well-appearing and in no acute  distress here today.  Differential includes electrolyte abnormalities, dehydration, infection  Notified of significant hypercalcemia and hypokalemia.  Started normal saline bolus and recommended potassium supplementation as well as admission to the hospital  Patient is adamantly refusing admission to the hospital or treatment beyond liter of normal saline.  She does have decisional capacity and I have described the risks of hypercalcemia and hypokalemia including severe weakness, fall with injury and potentially even seizures or death  She acknowledges these risks but says that she has something "personal she has to do "and that she can always return if she needs to  Patient is leaving [place]      ____________________________________________   FINAL CLINICAL IMPRESSION(S) / ED DIAGNOSES  Final diagnoses:  Hypercalcemia  Hypokalemia  Generalized weakness        Note:  This document was prepared using Dragon voice recognition software and may include unintentional dictation errors.    Lavonia Drafts, MD 03/12/21 [phone]

## 2021-09-28 ENCOUNTER — Other Ambulatory Visit: Payer: Self-pay

## 2021-09-28 ENCOUNTER — Observation Stay
Admission: EM | Admit: 2021-09-28 | Discharge: 2021-09-28 | Disposition: A | Discharge status: Home / Self Care | Attending: Internal Medicine | Admitting: Internal Medicine

## 2021-09-28 DIAGNOSIS — Z79899 Other long term (current) drug therapy: Secondary | ICD-10-CM | POA: Insufficient documentation

## 2021-09-28 DIAGNOSIS — E1165 Type 2 diabetes mellitus with hyperglycemia: Secondary | ICD-10-CM | POA: Diagnosis not present

## 2021-09-28 DIAGNOSIS — I959 Hypotension, unspecified: Secondary | ICD-10-CM | POA: Insufficient documentation

## 2021-09-28 DIAGNOSIS — I10 Essential (primary) hypertension: Secondary | ICD-10-CM | POA: Diagnosis not present

## 2021-09-28 DIAGNOSIS — F0390 Unspecified dementia without behavioral disturbance: Secondary | ICD-10-CM | POA: Diagnosis not present

## 2021-09-28 DIAGNOSIS — C159 Malignant neoplasm of esophagus, unspecified: Secondary | ICD-10-CM

## 2021-09-28 DIAGNOSIS — Z515 Encounter for palliative care: Secondary | ICD-10-CM | POA: Diagnosis not present

## 2021-09-28 DIAGNOSIS — R4182 Altered mental status, unspecified: Principal | ICD-10-CM | POA: Insufficient documentation

## 2021-09-28 LAB — CBG MONITORING, ED: Glucose-Capillary: 122 mg/dL — ABNORMAL HIGH (ref 70–99)

## 2021-09-28 MED ORDER — HALOPERIDOL 0.5 MG PO TABS
0.5000 mg | ORAL_TABLET | ORAL | Status: DC | PRN
  Filled 2021-09-28: qty 1

## 2021-09-28 MED ORDER — ONDANSETRON HCL 4 MG/2ML IJ SOLN: 4.0000 mg | Freq: Four times a day (QID) | INTRAMUSCULAR | Status: DC | PRN

## 2021-09-28 MED ORDER — BIOTENE DRY MOUTH MT LIQD
15.0000 mL | OROMUCOSAL | Status: DC | PRN
  Filled 2021-09-28: qty 15

## 2021-09-28 MED ORDER — POLYVINYL ALCOHOL 1.4 % OP SOLN
1.0000 [drp] | Freq: Four times a day (QID) | OPHTHALMIC | Status: DC | PRN
  Filled 2021-09-28: qty 15

## 2021-09-28 MED ORDER — HALOPERIDOL 0.5 MG PO TABS: 0.5000 mg | ORAL_TABLET | ORAL | Status: AC | PRN

## 2021-09-28 MED ORDER — SODIUM CHLORIDE 0.9 % IV BOLUS (SEPSIS)
1000.0000 mL | Freq: Once | INTRAVENOUS | Status: DC
Start: 2021-09-28 — End: 2021-09-28

## 2021-09-28 MED ORDER — HALOPERIDOL LACTATE 2 MG/ML PO CONC
0.5000 mg | ORAL | Status: DC | PRN
  Filled 2021-09-28: qty 5

## 2021-09-28 MED ORDER — SODIUM CHLORIDE 0.9 % IV BOLUS
1000.0000 mL | Freq: Once | INTRAVENOUS | Status: AC
  Administered 2021-09-28: 1000 mL via INTRAVENOUS

## 2021-09-28 MED ORDER — LORAZEPAM 1 MG PO TABS: 1.0000 mg | ORAL_TABLET | ORAL | Status: DC | PRN

## 2021-09-28 MED ORDER — LORAZEPAM 2 MG/ML IJ SOLN
1.0000 mg | INTRAMUSCULAR | Status: DC | PRN
  Administered 2021-09-28: 1 mg via INTRAVENOUS
  Filled 2021-09-28: qty 1

## 2021-09-28 MED ORDER — POLYVINYL ALCOHOL 1.4 % OP SOLN: 1.0000 [drp] | Freq: Four times a day (QID) | OPHTHALMIC | 0 refills | Status: AC | PRN

## 2021-09-28 MED ORDER — LORAZEPAM 1 MG PO TABS: 1.0000 mg | ORAL_TABLET | ORAL | 0 refills | Status: AC | PRN

## 2021-09-28 MED ORDER — DEXTROSE-NACL 5-0.45 % IV SOLN: INTRAVENOUS | Status: DC

## 2021-09-28 MED ORDER — ACETAMINOPHEN 650 MG RE SUPP: 650.0000 mg | Freq: Four times a day (QID) | RECTAL | Status: DC | PRN

## 2021-09-28 MED ORDER — GLYCOPYRROLATE 0.2 MG/ML IJ SOLN
0.2000 mg | INTRAMUSCULAR | Status: DC | PRN
  Filled 2021-09-28: qty 1

## 2021-09-28 MED ORDER — ONDANSETRON 4 MG PO TBDP
4.0000 mg | ORAL_TABLET | Freq: Four times a day (QID) | ORAL | 0 refills | Status: AC | PRN
Start: 2021-09-28 — End: ?

## 2021-09-28 MED ORDER — GLYCOPYRROLATE 1 MG PO TABS
1.0000 mg | ORAL_TABLET | ORAL | Status: DC | PRN
  Filled 2021-09-28: qty 1

## 2021-09-28 MED ORDER — ONDANSETRON 4 MG PO TBDP
4.0000 mg | ORAL_TABLET | Freq: Four times a day (QID) | ORAL | Status: DC | PRN
Start: 2021-09-28 — End: 2021-09-28

## 2021-09-28 MED ORDER — GLYCOPYRROLATE 1 MG PO TABS: 1.0000 mg | ORAL_TABLET | ORAL | Status: AC | PRN

## 2021-09-28 MED ORDER — ACETAMINOPHEN 325 MG PO TABS: 650.0000 mg | ORAL_TABLET | Freq: Four times a day (QID) | ORAL | Status: DC | PRN

## 2021-09-28 MED ORDER — LORAZEPAM 2 MG/ML PO CONC
1.0000 mg | ORAL | Status: DC | PRN
  Filled 2021-09-28: qty 0.5

## 2021-09-28 MED ORDER — HALOPERIDOL LACTATE 5 MG/ML IJ SOLN
0.5000 mg | INTRAMUSCULAR | Status: DC | PRN
  Administered 2021-09-28: 0.5 mg via INTRAVENOUS
  Filled 2021-09-28: qty 1

## 2021-09-28 MED ORDER — GLYCOPYRROLATE 0.2 MG/ML IJ SOLN
0.2000 mg | INTRAMUSCULAR | Status: DC | PRN
  Administered 2021-09-28: 0.2 mg via INTRAVENOUS
  Filled 2021-09-28 (×2): qty 1

## 2021-09-28 MED ORDER — BIOTENE DRY MOUTH MT LIQD: 15.0000 mL | OROMUCOSAL | Status: AC | PRN

## 2021-09-28 NOTE — ED Notes (Signed)
APS contacted due to the concern of patient safety under the care of family at home. Pt arrived from home where she is under Hospice care with family providing home care for pt. Per EMS, pts family was intoxicated on scene. Pt received medication by family on 09/27/2021 at approx 1100 AM that exceeded dose recommendations as well as given at the same time. Concern due to reported medication is for overdosing and the need for extended resources to ensure pt safety as well as education and resources for family.  ? ?[facility] also contacted and made aware of concern as well as update for hospital need.  ?

## 2021-09-28 NOTE — Discharge Summary (Signed)
Donna Aguirre DGU:[id number] DOB: [birth year] DOA: 09/28/2021 ? ?[facility]P: [facility] [place] ? ?Admit date: 09/28/2021 ?Discharge date: 09/28/2021 ? ?Admitted From: home  ?Disposition:  hospice facility ? ? ?Discharge Condition:Stable ?CODE STATUS:DNR ?Diet recommendation: regular ? ? ?Brief/Interim Summary: ?Per HPI: Donna Aguirre is a 66 y.o. 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  Patient is unable to contribute to history. ?ED course and data review: Hypotensive in the ED to 71/44 with pulse 114 and respirations 25 with O2 sat of 100% on 8 L via tracheostoma.  Capillary blood glucose 122. ?Hospitalist consulted for admission for comfort care after daughter expressed discomfort with taking mother's home in spite of previously expressed wishes to die at home. ?Patient now being transferred to hospice house ? ? ?Esophageal carcinoma ([place]) ?No further treatment ?  ?Comfort measures only status ?Comfort care measures , going to hospice facility ?  ?Tracheostomy dependent (CMS-HCC) ?Gastrostomy tube dependent (CMS-HCC) ?-tube care ?Esophageal mass ?Severe protein-calorie malnutrition (CMS-HCC) ?DM (diabetes mellitus) (CMS-HCC) ?Acute metabolic encephalopathy ?Primary esophageal squamous cell carcinoma (CMS-HCC) ?Tracheoesophageal fistula (CMS-HCC)  ?SVC thrombus ?-No treatment of the above due to hospice care ?  ? ? ? ?Discharge Diagnoses:  ?Principal Problem: ?  Comfort measures only status ?Active Problems: ?  Esophageal carcinoma ([place]) ? ? ? ?Discharge Instructions ? ?Discharge Instructions   ? ? Diet - low sodium heart healthy   Complete by: As directed ?  ?  Increase activity slowly   Complete by: As directed ?  ? ?  ? ?Allergies as of 09/28/2021   ?No Known Allergies ?  ? ?  ?Medication List  ?  ? ?STOP taking these medications   ? ?amLODipine 5 MG tablet ?Commonly known as: NORVASC ?  ?cyanocobalamin 1000 MCG tablet ?  ?folic acid 1 MG tablet ?Commonly known as: FOLVITE ?  ?Lactulose 20 GM/30ML Soln ?  ?lisinopril 20 MG tablet ?Commonly known as: ZESTRIL ?  ?Magnesium Oxide 400 MG Caps ?  ?metoprolol tartrate 25 MG tablet ?Commonly known as: LOPRESSOR ?  ?multivitamin with minerals Tabs tablet ?  ?nicotine 14 mg/24hr patch ?Commonly known as: NICODERM CQ - dosed in mg/24 hours ?  ?omeprazole 40 MG capsule ?Commonly known as: PRILOSEC ?  ?thiamine 100 MG tablet ?  ?traZODone 50 MG tablet ?Commonly known as: DESYREL ?  ? ?  ? ?TAKE these medications   ? ?antiseptic oral rinse Liqd ?Apply 15 mLs topically as needed for dry mouth. ?  ?glycopyrrolate 1 MG tablet ?Commonly known as: ROBINUL ?Take 1 tablet (1 mg total) by mouth every 4 (four) hours as needed (excessive secretions). ?  ?haloperidol 0.5 MG tablet ?Commonly known as: HALDOL ?Take 1 tablet (0.5 mg total) by mouth every 4 (four) hours as needed for agitation (or delirium). ?  ?LORazepam 1 MG tablet ?Commonly known as: ATIVAN ?Take 1 tablet (1 mg total) by mouth every 4 (four) hours as needed for anxiety. ?  ?ondansetron 4 MG disintegrating tablet ?Commonly known as: ZOFRAN-ODT ?Take 1 tablet (4 mg total) by mouth every 6 (six) hours as needed for nausea. ?  ?polyvinyl alcohol 1.4 % ophthalmic solution ?Commonly known as: LIQUIFILM TEARS ?Place 1 drop into both  eyes 4 (four) times daily as needed for dry eyes. ?  ? ?  ? ? ?No Known Allergies ? ?Consultations: ? ? ? ?Procedures/Studies: ?No results found. ? ? ? ?Subjective: ?Eyes closed, doesn't respond to me. ? ?Discharge Exam: ?Vitals:  ? 09/28/21 0600 09/28/21 0700  ?BP: (!) 82/55 (!) 87/63  ?Pulse: (!) 109 (!) 110  ?Resp: (!) 24 (!) 25  ?Temp:    ?SpO2: 100% 99%   ? ?Vitals:  ? 09/28/21 0500 09/28/21 0533 09/28/21 0600 09/28/21 0700  ?BP: (!) 70/51 (!) 84/61 (!) 82/55 (!) 87/63  ?Pulse: (!) 110 (!) 110 (!) 109 (!) 110  ?Resp: (!) 24 (!) 26 (!) 24 (!) 25  ?Temp:      ?TempSrc:      ?SpO2: 100% 100% 100% 99%  ?Weight:      ?Height:      ? ? ?General: nad, agonal breathing ?Cardiovascular: RRR, S1/S2 +, no rubs, no gallops ?Respiratory: mild rhonchorus ?Abdominal: Soft, NT, ND, bowel sounds + ?Extremities: no edema ? ? ? ?The results of significant diagnostics from this hospitalization (including imaging, microbiology, ancillary and laboratory) are listed below for reference.   ? ? ?Microbiology: ?No results found for this or any previous visit (from the past 240 hour(s)).  ? ?Labs: ?BNP (last 3 results) ?No results for input(s): BNP in the last 8760 hours. ?Basic Metabolic Panel: ?No results for input(s): NA, K, CL, CO2, GLUCOSE, BUN, CREATININE, CALCIUM, MG, PHOS in the last 168 hours. ?Liver Function Tests: ?No results for input(s): AST, ALT, ALKPHOS, BILITOT, PROT, ALBUMIN in the last 168 hours. ?No results for input(s): LIPASE, AMYLASE in the last 168 hours. ?No results for input(s): AMMONIA in the last 168 hours. ?CBC: ?No results for input(s): WBC, NEUTROABS, HGB, HCT, MCV, PLT in the last 168 hours. ?Cardiac Enzymes: ?No results for input(s): CKTOTAL, CKMB, CKMBINDEX, TROPONINI in the last 168 hours. ?BNP: ?Invalid input(s): POCBNP ?CBG: ?Recent Labs  ?Lab 09/28/21 ?0151  ?GLUCAP 122*  ? ?D-Dimer ?No results for input(s): DDIMER in the last 72 hours. ?Hgb A1c ?No results for input(s): HGBA1C in the last 72 hours. ?Lipid Profile ?No results for input(s): CHOL, HDL, LDLCALC, TRIG, CHOLHDL, LDLDIRECT in the last 72 hours. ?Thyroid function studies ?No results for input(s): TSH, T4TOTAL, T3FREE, THYROIDAB in the last 72 hours. ? ?Invalid input(s): FREET3 ?Anemia work up ?No results for input(s): VITAMINB12, FOLATE, FERRITIN, TIBC, IRON, RETICCTPCT in the last 72  hours. ?Urinalysis ?   ?Component Value Date/Time  ? [place] (A) 10/22/2020 0858  ? APPEARANCEUR HAZY (A) 10/22/2020 0858  ? LABSPEC 1.019 10/22/2020 0858  ? PHURINE 7.0 10/22/2020 0858  ? GLUCOSEU NEGATIVE 10/22/2020 0858  ? [place] NEGATIVE 10/22/2020 0858  ? [place] NEGATIVE 10/22/2020 0858  ? [place] NEGATIVE 10/22/2020 0858  ? PROTEINUR NEGATIVE 10/22/2020 0858  ? NITRITE NEGATIVE 10/22/2020 0858  ? LEUKOCYTESUR SMALL (A) 10/22/2020 0858  ? ?Sepsis Labs ?Invalid input(s): PROCALCITONIN,  WBC,  LACTICIDVEN ?Microbiology ?No results found for this or any previous visit (from the past 240 hour(s)). ? ? ?Time coordinating discharge: Over 30 minutes ? ?SIGNED: ? ? ?Nolberto Hanlon, MD  ?[facility] ?09/28/2021, 9:25 AM ?Pager  ? ?If 7PM-7AM, please contact night-coverage ?[email or web address] ?Password [email or web address]  ?

## 2021-09-28 NOTE — ED Notes (Signed)
Daughter, Julienne Kass at bedside. Writer explained that concern is present due to the amount of medication given at one time by caregiver in which classifies as overdosing due to exceeding prescribed medication instructions as well as dosage. Writer explained that there is also concern due to the report of caregivers in the home intoxicated at time of EMS arrival as well as Ward, MD trying to obtain pt information from family by phone and experienced slurred speech as well as continuous yelling without clear understanding or provided answer by caregivers. Katharine Look explained that she welcomes any help possible that she was informed by [facility] staff that she would have more help once discharged home over weekend and that she has yet to receive more help. Katharine Look expresses to Probation officer that she does not feel comfortable having pt transported home while in current lethargic state due to the pt being alert and oriented prior to medication on 09/27/2021. Writer explained that pt could be admitted for comfort care so that APS as well as hospitals social worker can work together with Hospice to ensure safe care is obtained as well as proper resources for family care if warranted by DSS. Ms. Wynetta Emery sts to writer, "Thank you, I do need help cause they keep changing all her medicine and I just don't know."  ? ?Daughter provided plan of care that pt would be in hospital and that [place] supervisor Elba Barman would be coming to see pt around 0800 this AM as well as contacting and sending social work to home. If any update or change in pts status, daughter would be called.  ? ? ? ?

## 2021-09-28 NOTE — Discharge Instructions (Signed)
Your mother is within the active stages of dying.  I recommend that you continue her medications for pain, anxiety, agitation as prescribed.  Please note that these medications can make her very drowsy but will help to keep her comfortable during this process.  If you have any questions or concerns, I recommend that you call the hospice nurse on call who can give you recommendations over the phone. ?

## 2021-09-28 NOTE — Care Management CC44 (Signed)
Condition Code 44 Documentation Completed ? ?Patient Details  ?Name: Donna Aguirre ?MRN: [id number] ?Date of Birth: January 23, 1956 ? ? ?Condition Code 44 given:  Yes ?Patient signature on Condition Code 44 notice:  Yes ?Documentation of 2 MD's agreement:  Yes ?Code 44 added to claim:  Yes ? ? ? ?Shelbie Hutching, RN ?09/28/2021, 10:19 AM ? ?

## 2021-09-28 NOTE — ED Notes (Signed)
Elba Barman DSS Supervisor [phone] ?Called to report that she would be coming to hospital to assess pt around 0800 AM, as well as sending social work to pts home and if family did not agree for pt to stay at hospital, to call her immediately to intervene with emergent order.  ?

## 2021-09-28 NOTE — TOC Transition Note (Signed)
Transition of Care (TOC) - CM/SW Discharge Note ? ? ?Patient Details  ?Name: SEANNE CHIRICO ?MRN: [id number] ?Date of Birth: Jun 21, 1955 ? ?Transition of Care (TOC) CM/SW Contact:  ?Shelbie Hutching, RN ?Phone Number: ?09/28/2021, 9:20 AM ? ? ?Clinical Narrative:    ?Patient will discharge to the [facility] today.  Misty with [facility] has arranged EMS transport for 11 am.   ? ? ?Final next level of care: [place] ?Barriers to Discharge: Barriers Resolved ? ? ?Patient Goals and CMS Choice ?Patient states their goals for this hospitalization and ongoing recovery are:: Family has decided on hospice home ?CMS Medicare.gov Compare Post Acute Care list provided to:: Patient Represenative (must comment) ?Choice offered to / list presented to : Adult Children ? ?Discharge Placement ?  ?           ?  ?  ?  ?  ? ?Discharge Plan and Services ?  ?  ?Post Acute Care Choice: Hospice          ?  ?  ?  ?  ?  ?  ?  ?  ?  ?  ? ?Social Determinants of Health (SDOH) Interventions ?  ? ? ?Readmission Risk Interventions ?   ? View : No data to display.  ?  ?  ?  ? ? ? ? ? ?

## 2021-09-28 NOTE — ED Notes (Signed)
Pt transported to C-pod on hospital bed ?

## 2021-09-28 NOTE — Assessment & Plan Note (Signed)
No further treatment 

## 2021-09-28 NOTE — Assessment & Plan Note (Addendum)
Comfort care measures per order set ?Spoke with daughter Donna Aguirre who states that she is unable to do with her mother dying at home so would rather her pass in the hospital or an inpatient hospice ?Spoke with hospice nurse Abigail Butts and made her aware ?

## 2021-09-28 NOTE — ED Notes (Signed)
Daughter at the bedside

## 2021-09-28 NOTE — ED Notes (Signed)
Initial assessment of patient on arrival to ED, pt's pants are soaked with some type of liquid. Her brief, however, is fairly dry. Her skin on her backside has no wounds or redness observed. Pt does appear disheveled as her hair is matted, her clothing is dirty, and she still has electrodes on her chest from her hospital visit at [facility] on 09/21/21.  ?

## 2021-09-28 NOTE — ED Provider Notes (Addendum)
? ?[facility] ?Provider Note ? ? ? Event Date/Time  ? First MD Initiated Contact with Patient 09/28/21 0119   ?  (approximate) ? ? ?History  ? ?Altered Mental Status ? ? ?HPI ? ?Donna Aguirre is a 66 y.o. female with history of esophageal mass with metastasis, hypertension, diabetes, dementia, chronic respiratory failure currently DNR/DNI on hospice who presents to the emergency department with EMS for concerns for altered mental status.  EMS reports family called 911 tonight because they were concerned that she seemed altered from her baseline.  EMS states that around 11 AM today family gave patient atropine drops, Haldol, morphine, Ativan, OxyContin and Remeron.  Patient is not able to provide any history.  EMS states that multiple family members at the house appeared intoxicated.  Patient was tachycardic with EMS but otherwise normal vital signs.  She is receiving oxygen through a trach collar which is her baseline.  Blood glucose was in the 150s. ? ?I spoke to patient's daughters Ivin Booty and Benjamine Mola by phone.  They state that they became concerned tonight because she look like she was having a hard time breathing and was altered and that is why they called EMS.  They are not revoking her DNR/DNI.  They state that she would like to die at home.  They do not want her admitted to the hospital. ? ? ?History provided by MS. ? ? ? ?Past Medical History:  ?Diagnosis Date  ? Abdominal pain 11/22/2019  ? Arthritis   ? Diabetes mellitus without complication ([place])   ? Gout   ? Hypertension   ? Hypokalemia 11/22/2019  ? Hypomagnesemia 11/22/2019  ? Hyponatremia 11/16/2018  ? ? ?Past Surgical History:  ?Procedure Laterality Date  ? ESOPHAGOGASTRODUODENOSCOPY N/A 08/26/2020  ? Procedure: ESOPHAGOGASTRODUODENOSCOPY (EGD);  Surgeon: Jonathon Bellows, MD;  Location: [facility] ENDOSCOPY;  Service: Gastroenterology;  Laterality: N/A;  ? ESOPHAGOGASTRODUODENOSCOPY (EGD) WITH PROPOFOL N/A 11/23/2019  ? Procedure:  ESOPHAGOGASTRODUODENOSCOPY (EGD) WITH PROPOFOL;  Surgeon: [name], MD;  Location: [facility] ENDOSCOPY;  Service: Gastroenterology;  Laterality: N/A;  ? perforated gastric ulcer    ? ? ?MEDICATIONS:  ?Prior to Admission medications   ?Medication Sig Start Date End Date Taking? Authorizing Provider  ?amLODipine (NORVASC) 5 MG tablet Take 1 tablet (5 mg total) by mouth daily. 10/24/20   Loletha Grayer, MD  ?cyanocobalamin 1000 MCG tablet Take 1 tablet (1,000 mcg total) by mouth daily. 10/24/20   Loletha Grayer, MD  ?folic acid (FOLVITE) 1 MG tablet Take 1 tablet (1 mg total) by mouth daily. 10/25/20   Loletha Grayer, MD  ?Lactulose 20 GM/30ML SOLN 10g daily (hold if more than 3 bowel movements daily) 10/24/20   Loletha Grayer, MD  ?lisinopril (ZESTRIL) 20 MG tablet Take 1 tablet (20 mg total) by mouth daily. 10/24/20 01/22/21  Loletha Grayer, MD  ?Magnesium Oxide 400 MG CAPS Take 1 capsule (400 mg total) by mouth daily at 12 noon. 10/24/20   Loletha Grayer, MD  ?metoprolol tartrate (LOPRESSOR) 25 MG tablet Take 0.5 tablets (12.5 mg total) by mouth 2 (two) times daily. 10/24/20 11/23/20  Loletha Grayer, MD  ?Multiple Vitamin (MULTIVITAMIN WITH MINERALS) TABS tablet Take 1 tablet by mouth daily. 10/25/20   Loletha Grayer, MD  ?nicotine (NICODERM CQ - DOSED IN MG/24 HOURS) 14 mg/24hr patch One '14mg'$  patch chest wall daily (okay to substitute generic) dont use if smoking 10/24/20   Loletha Grayer, MD  ?omeprazole (PRILOSEC) 40 MG capsule Take 1 capsule (40 mg total) by  mouth daily. 10/24/20 11/23/20  Loletha Grayer, MD  ?thiamine 100 MG tablet Take 1 tablet (100 mg total) by mouth daily. 10/24/20   Loletha Grayer, MD  ?traZODone (DESYREL) 50 MG tablet Take 1 tablet (50 mg total) by mouth at bedtime. 10/24/20   Loletha Grayer, MD  ? ? ?Physical Exam  ? ?Triage Vital Signs: ?ED Triage Vitals  ?Enc Vitals Group  ?   BP 09/28/21 0138 (!) 83/58  ?   Pulse Rate 09/28/21 0138 (!) 118  ?   Resp 09/28/21 0138 (!) 24   ?   Temp 09/28/21 0138 99.6 ?F (37.6 ?C)  ?   Temp Source 09/28/21 0138 Axillary  ?   SpO2 09/28/21 0127 94 %  ?   Weight 09/28/21 0139 148 lb (67.1 kg)  ?   Height 09/28/21 0139 '5\' 4"'$  (1.626 m)  ?   Head Circumference --   ?   Peak Flow --   ?   Pain Score --   ?   Pain Loc --   ?   Pain Edu? --   ?   Excl. in [place]? --   ? ? ?Most recent vital signs: ?Vitals:  ? 09/28/21 0230 09/28/21 0300  ?BP: 92/60 (!) 71/44  ?Pulse: (!) 116 (!) 114  ?Resp: (!) 27 (!) 25  ?Temp:    ?SpO2: 100% 100%  ? ? ?CONSTITUTIONAL: Alert and oriented to person.   ?HEAD: Normocephalic, atraumatic ?EYES: Conjunctivae clear, pupils appear equal, sclera nonicteric ?ENT: normal nose; moist mucous membranes ?NECK: Supple, normal ROM ?CARD: RRR; S1 and S2 appreciated; no murmurs, no clicks, no rubs, no gallops ?RESP: Normal chest excursion without splinting or tachypnea; breath sounds clear and equal bilaterally; no wheezes, no rhonchi, no rales, no hypoxia or respiratory distress, speaking full sentences ?ABD/GI: Normal bowel sounds; non-distended; soft, non-tender, no rebound, no guarding, no peritoneal signs ?BACK: The back appears normal ?EXT: Normal ROM in all joints; no deformity noted, no edema; no cyanosis ?SKIN: Normal color for age and race; warm; no rash on exposed skin ?NEURO: Moves all extremities equally, normal speech ?PSYCH: The patient's mood and manner are appropriate. ? ? ?ED Results / Procedures / Treatments  ? ?LABS: ?(all labs ordered are listed, but only abnormal results are displayed) ?Labs Reviewed  ?CBG MONITORING, ED - Abnormal; Notable for the following components:  ?    Result Value  ? Glucose-Capillary 122 (*)   ? All other components within normal limits  ? ? ? ?EKG: ? EKG Interpretation ? ?Date/Time:  Tuesday Sep 28 2021 01:38:56 EDT ?Ventricular Rate:  118 ?PR Interval:    ?QRS Duration: 98 ?QT Interval:  385 ?QTC Calculation: 540 ?R Axis:   57 ?Text Interpretation: Junctional tachycardia Borderline low voltage,  extremity leads Nonspecific repol abnormality, diffuse leads Prolonged QT interval Confirmed by Pryor Curia [phone]) on 09/28/2021 2:06:51 AM ?  ? ?  ? ? ? ?RADIOLOGY: ?My personal review and interpretation of imaging:   ? ?I have personally reviewed all radiology reports.   ?No results found. ? ? ?PROCEDURES: ? ?Critical Care performed: Yes, see critical care procedure note(s) ? ? ?CRITICAL CARE ?Performed by: Cyril Mourning Yaqub Arney ? ? ?Total critical care time: 35 minutes ? ?Critical care time was exclusive of separately billable procedures and treating other patients. ? ?Critical care was necessary to treat or prevent imminent or life-threatening deterioration. ? ?Critical care was time spent personally by me on the following activities: development of treatment plan with patient and/or  surrogate as well as nursing, discussions with consultants, evaluation of patient's response to treatment, examination of patient, obtaining history from patient or surrogate, ordering and performing treatments and interventions, ordering and review of laboratory studies, ordering and review of radiographic studies, pulse oximetry and re-evaluation of patient's condition. ? ? ?Procedures ? ? ? ?IMPRESSION / MDM / ASSESSMENT AND PLAN / ED COURSE  ?I reviewed the triage vital signs and the nursing notes. ? ? ? 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  Patient is DNR/DNI on hospice. ? ?The patient is on the cardiac monitor to evaluate for evidence of arrhythmia and/or significant heart rate changes. ? ? ?DIFFERENTIAL DIAGNOSIS (includes but not limited to):   Sepsis, dehydration, anemia, electrolyte derangement, PE, ACS, intracranial hemorrhage, CVA, meningitis, encephalitis, bacteremia, UTI, respiratory failure ? ? ?PLAN: Patient is tachycardic, tachypneic, hypotensive here.  She does not appear however to be in any distress and  appears comfortable.  Her blood glucose is normal.  Her EKG shows tachycardia without new ischemic abnormality.  I did speak to 2 of her daughters on the phone and they confirmed that she is DNR/DNI and on hospice and

## 2021-09-28 NOTE — Progress Notes (Signed)
Patient incontinent of stool, cleaned and brief placed on patient.  ?

## 2021-09-28 NOTE — Progress Notes (Signed)
Report given to [facility] care hospice, medical necessity completed.  ?

## 2021-09-28 NOTE — ED Triage Notes (Signed)
Patient arrived via EMS from home when her family called due to pt being lethargic and because her family states that her mouth is "too dry" since hospice started her on Atropine drops. Per EMS, pt was given Haldol, Morphine, Lorazepam, Oxy, and mirtazapine yesterday at 11am. Since then, pt has been drowsy and "out of it". Family wants all medication out of her system and to "start over". Pt was recently discharged from [facility] with Hospice and has  DNR in place due to terminal CA with mets. ?

## 2021-09-28 NOTE — Care Management Obs Status (Signed)
MEDICARE OBSERVATION STATUS NOTIFICATION ? ? ?Patient Details  ?Name: Donna Aguirre ?MRN: [id number] ?Date of Birth: April 25, 1956 ? ? ?Medicare Observation Status Notification Given:  Yes ? ? ? ?Shelbie Hutching, RN ?09/28/2021, 10:19 AM ?

## 2021-09-28 NOTE — ED Notes (Signed)
Pt placed in a hospital bed  

## 2021-09-28 NOTE — H&P (Addendum)
?History and Physical  ? ? ?Patient: Donna Aguirre OYD:[id number] DOB: 1956/03/12 ?DOA: 09/28/2021 ?DOS: the patient was seen and examined on 09/28/2021 ?[facility]: [facility] [place]  ?Patient coming from: Home ? ?Chief Complaint:  ?Chief Complaint  ?Patient presents with  ? Altered Mental Status  ? ? ?HPI: Donna Aguirre is a 66 y.o. 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  Patient is unable to contribute to history. ?ED course and data review: Hypotensive in the ED to 71/44 with pulse 114 and respirations 25 with O2 sat of 100% on 8 L via tracheostoma.  Capillary blood glucose 122. ?Hospitalist consulted for admission for comfort care after daughter expressed discomfort with taking mother's home in spite of previously expressed wishes to die at home. ? ?Review of Systems  ?Unable to perform ROS: Medical condition  ? ?Past Medical History:  ?Diagnosis Date  ? Abdominal pain 11/22/2019  ? Arthritis   ? Diabetes mellitus without complication ([place])   ? Gout   ? Hypertension   ? Hypokalemia 11/22/2019  ? Hypomagnesemia 11/22/2019  ? Hyponatremia 11/16/2018  ? ?Past Surgical History:  ?Procedure Laterality Date  ? ESOPHAGOGASTRODUODENOSCOPY N/A 08/26/2020  ? Procedure: ESOPHAGOGASTRODUODENOSCOPY (EGD);  Surgeon: Jonathon Bellows, MD;  Location: [facility] ENDOSCOPY;  Service: Gastroenterology;  Laterality: N/A;  ? ESOPHAGOGASTRODUODENOSCOPY (EGD) WITH PROPOFOL N/A 11/23/2019  ? Procedure: ESOPHAGOGASTRODUODENOSCOPY (EGD) WITH PROPOFOL;  Surgeon: [name], MD;  Location: [facility] ENDOSCOPY;  Service: Gastroenterology;  Laterality: N/A;  ? perforated gastric ulcer    ? ?Social  History:  reports that she has been smoking cigarettes. She has been smoking an average of .5 packs per day. Her smokeless tobacco use includes snuff. She reports current alcohol use of about 7.0 standard drinks per week. She reports that she does not use drugs. ? ?No Known Allergies ? ?Family History  ?Problem Relation Age of Onset  ? Hypertension Mother   ? Hypertension Father   ? ? ?Prior to Admission medications   ?Medication Sig Start Date End Date Taking? Authorizing Provider  ?amLODipine (NORVASC) 5 MG tablet Take 1 tablet (5 mg total) by mouth daily. 10/24/20   Loletha Grayer, MD  ?cyanocobalamin 1000 MCG tablet Take 1 tablet (1,000 mcg total) by mouth daily. 10/24/20   Loletha Grayer, MD  ?folic acid (FOLVITE) 1 MG tablet Take 1 tablet (1 mg total) by mouth daily. 10/25/20   Loletha Grayer, MD  ?Lactulose 20 GM/30ML SOLN 10g daily (hold if more than 3 bowel movements daily) 10/24/20   Loletha Grayer, MD  ?lisinopril (ZESTRIL) 20 MG tablet Take 1 tablet (20 mg total) by mouth daily. 10/24/20 01/22/21  Loletha Grayer, MD  ?Magnesium Oxide 400 MG CAPS Take 1 capsule (400 mg total) by mouth daily at 12 noon. 10/24/20   Loletha Grayer, MD  ?metoprolol tartrate (LOPRESSOR) 25 MG tablet Take 0.5 tablets (12.5 mg total) by mouth 2 (two) times daily. 10/24/20 11/23/20  Loletha Grayer, MD  ?Multiple Vitamin (MULTIVITAMIN WITH MINERALS) TABS tablet Take 1 tablet by mouth daily. 10/25/20   Loletha Grayer, MD  ?nicotine (NICODERM CQ - DOSED IN MG/24 HOURS) 14 mg/24hr patch One '14mg'$  patch chest wall daily (okay to substitute generic) dont use  if smoking 10/24/20   Loletha Grayer, MD  ?omeprazole (PRILOSEC) 40 MG capsule Take 1 capsule (40 mg total) by mouth daily. 10/24/20 11/23/20  Loletha Grayer, MD  ?thiamine 100 MG tablet Take 1 tablet (100 mg total) by mouth daily. 10/24/20   Loletha Grayer, MD  ?traZODone (DESYREL) 50 MG tablet Take 1 tablet (50 mg total) by mouth at bedtime. 10/24/20   Loletha Grayer, MD   ? ? ?Physical Exam: ?Vitals:  ? 09/28/21 0230 09/28/21 0300 09/28/21 0330 09/28/21 0400  ?BP: 92/60 (!) 71/44 (!) 70/52 (!) 86/64  ?Pulse: (!) 116 (!) 114 (!) 111 (!) 111  ?Resp: (!) 27 (!) 25 (!) 28 18  ?Temp:      ?TempSrc:      ?SpO2: 100% 100% 100% 100%  ?Weight:      ?Height:      ? ?Physical Exam ?Constitutional:   ?   Appearance: She is ill-appearing and toxic-appearing.  ?Neurological:  ?   Comments: Decreased responsiveness and generalized weakness  ? ? ? ?Data Reviewed: ?Relevant notes from primary care and specialist visits, past discharge summaries as available in EHR, including Care Everywhere. ?Prior diagnostic testing as pertinent to current admission diagnoses ?Updated medications and problem lists for reconciliation ?ED course, including vitals, labs, imaging, treatment and response to treatment ?Triage notes, nursing and pharmacy notes and ED provider's notes ?Notable results as noted in HPI ? ? ?Assessment and Plan: ?Esophageal carcinoma ([place]) ?No further treatment ? ?Comfort measures only status ?Comfort care measures per order set ?Spoke with daughter Lolita Cram who states that she is unable to do with her mother dying at home so would rather her pass in the hospital or an inpatient hospice ?Spoke with hospice nurse Abigail Butts and made her aware ? ?Tracheostomy dependent (CMS-HCC) ?Gastrostomy tube dependent (CMS-HCC) ?-tube care ?Esophageal mass ?Severe protein-calorie malnutrition (CMS-HCC) ?DM (diabetes mellitus) (CMS-HCC) ?Acute metabolic encephalopathy ?Primary esophageal squamous cell carcinoma (CMS-HCC) ?Tracheoesophageal fistula (CMS-HCC)  ?SVC thrombus ?-No treatment of the above due to hospice care ? ? ? ? ?Advance Care Planning:   Code Status: DNR  ? ?Consults: none ? ?Family Communication: Daughter Lolita Cram ? ?Severity of Illness: ?The appropriate patient status for this patient is INPATIENT. Inpatient status is judged to be reasonable and necessary in order to provide the  required intensity of service to ensure the patient's safety. The patient's presenting symptoms, physical exam findings, and initial radiographic and laboratory data in the context of their chronic comorbidities is felt to place them at high risk for further clinical deterioration. Furthermore, it is not anticipated that the patient will be medically stable for discharge from the hospital within 2 midnights of admission.  ? ?* I certify that at the point of admission it is my clinical judgment that the patient will require inpatient hospital care spanning beyond 2 midnights from the point of admission due to high intensity of service, high risk for further deterioration and high frequency of surveillance required.* ? ?Author: ?Athena Masse, MD ?09/28/2021 4:40 AM ? ?For on call review [email or web address].  ?

## 2021-10-28 DEATH — deceased
